# Patient Record
Sex: Male | Born: 1945 | Race: White | Hispanic: No | Marital: Married | Smoking: Former smoker
Health system: Southern US, Community
[De-identification: ages and names within clinical notes are randomized; demographics above are authoritative.]

## PROBLEM LIST (undated history)

## (undated) DIAGNOSIS — M719 Bursopathy, unspecified: Secondary | ICD-10-CM

## (undated) DIAGNOSIS — I499 Cardiac arrhythmia, unspecified: Secondary | ICD-10-CM

## (undated) DIAGNOSIS — M722 Plantar fascial fibromatosis: Secondary | ICD-10-CM

## (undated) DIAGNOSIS — Z9889 Other specified postprocedural states: Secondary | ICD-10-CM

## (undated) DIAGNOSIS — E785 Hyperlipidemia, unspecified: Secondary | ICD-10-CM

## (undated) DIAGNOSIS — C801 Malignant (primary) neoplasm, unspecified: Secondary | ICD-10-CM

## (undated) DIAGNOSIS — R112 Nausea with vomiting, unspecified: Secondary | ICD-10-CM

## (undated) DIAGNOSIS — M1990 Unspecified osteoarthritis, unspecified site: Secondary | ICD-10-CM

## (undated) DIAGNOSIS — R011 Cardiac murmur, unspecified: Secondary | ICD-10-CM

## (undated) DIAGNOSIS — Z87442 Personal history of urinary calculi: Secondary | ICD-10-CM

## (undated) DIAGNOSIS — I00 Rheumatic fever without heart involvement: Secondary | ICD-10-CM

## (undated) DIAGNOSIS — I209 Angina pectoris, unspecified: Secondary | ICD-10-CM

## (undated) DIAGNOSIS — E119 Type 2 diabetes mellitus without complications: Secondary | ICD-10-CM

## (undated) DIAGNOSIS — K219 Gastro-esophageal reflux disease without esophagitis: Secondary | ICD-10-CM

## (undated) DIAGNOSIS — I10 Essential (primary) hypertension: Secondary | ICD-10-CM

## (undated) DIAGNOSIS — N189 Chronic kidney disease, unspecified: Secondary | ICD-10-CM

## (undated) DIAGNOSIS — I2510 Atherosclerotic heart disease of native coronary artery without angina pectoris: Secondary | ICD-10-CM

## (undated) DIAGNOSIS — I739 Peripheral vascular disease, unspecified: Secondary | ICD-10-CM

## (undated) HISTORY — PX: SHOULDER ARTHROSCOPY: SHX128

## (undated) HISTORY — DX: Hyperlipidemia, unspecified: E78.5

## (undated) HISTORY — PX: CIRCUMCISION: SUR203

## (undated) HISTORY — PX: VASECTOMY: SHX75

## (undated) HISTORY — PX: FEMORAL-PERONEAL BYPASS GRAFT: SHX5163

## (undated) HISTORY — PX: CARDIAC CATHETERIZATION: SHX172

## (undated) HISTORY — PX: OTHER SURGICAL HISTORY: SHX169

---

## 1998-01-10 ENCOUNTER — Observation Stay (HOSPITAL_COMMUNITY): Admission: RE | Admit: 1998-01-10 | Discharge: 1998-01-11 | Payer: Self-pay | Admitting: *Deleted

## 2004-04-18 ENCOUNTER — Encounter: Admission: RE | Admit: 2004-04-18 | Discharge: 2004-04-18 | Payer: Self-pay | Admitting: Internal Medicine

## 2010-07-04 ENCOUNTER — Ambulatory Visit
Admission: RE | Admit: 2010-07-04 | Discharge: 2010-07-04 | Disposition: A | Discharge status: Home / Self Care | Payer: Medicare Other | Source: Ambulatory Visit | Attending: Gastroenterology | Admitting: Gastroenterology

## 2010-07-04 ENCOUNTER — Other Ambulatory Visit: Payer: Self-pay | Admitting: Gastroenterology

## 2010-07-04 DIAGNOSIS — R1031 Right lower quadrant pain: Secondary | ICD-10-CM

## 2010-07-04 MED ORDER — IOHEXOL 300 MG/ML  SOLN
100.0000 mL | Freq: Once | INTRAMUSCULAR | Status: AC | PRN
  Administered 2010-07-04: 100 mL via INTRAVENOUS

## 2012-05-21 DIAGNOSIS — H269 Unspecified cataract: Secondary | ICD-10-CM

## 2012-05-21 HISTORY — DX: Unspecified cataract: H26.9

## 2013-07-23 ENCOUNTER — Other Ambulatory Visit: Payer: Self-pay | Admitting: Orthopedic Surgery

## 2013-07-24 ENCOUNTER — Other Ambulatory Visit: Payer: Self-pay | Admitting: Orthopedic Surgery

## 2013-07-24 NOTE — H&P (Signed)
[name] is an 68 y.o. male.   Chief Complaint: back and left leg pain HPI: The patient is a 68 year old male being followed for their left-sided back pain and leg pain down to the foot. Symptoms reported today include: pain, aching, pain with weightbearing, difficulty ambulating, weakness, numbness, burning and leg pain, while the patient does not report symptoms of: pain with sitting or foot drop. The patient states that they are doing poorly. The following medication has been used for pain control: Ultram. The patient reports their current pain level to be severe. The patient presents today following MRI. The patient has not gotten any relief of their symptoms with activity modification, conservative measures, Cortisone injections, NSAIDs or rest. Note for "Follow-up back": He has decided to use crutches to help him walk but this makes his back worse.  He reports increasing pain through this past weekend with no change in activity or new injury. He reports continued pain left lower back into the buttock and left posterolateral leg to the ankle with numbness in the same distribution and into the dorsal foot. The leg does also feel weak like it will give out on him. Over the last week or so he is also having pain in the anterior left thigh which "feels bruised." He denies any right sided symptoms. Reports the pain is actually most severe in the back and buttock. He has previously seen Dr. Nelva Bush and has had several ESI's, initially with relief, but over time less successful. He is taking ultram as needed for pain, but it's not helping. He did take some Norco from his wife which did help. She also has given him neurontin and lyrica which did not seem to help as much as the norco. His pain is least severe when lying down on his back with his left leg flexed up. His pain is worse sitting.   No past medical history on file.  No past surgical history on file.  No family history on file. Social  History:  has no tobacco, alcohol, and drug history on file.  Allergies: Allergies not on file   (Not in a hospital admission)  No results found for this or any previous visit (from the past 48 hour(s)). No results found.  Review of Systems  Constitutional: Negative.   HENT: Negative.   Eyes: Negative.   Respiratory: Negative.   Cardiovascular: Negative.   Gastrointestinal: Negative.   Genitourinary: Negative.   Musculoskeletal: Positive for back pain.  Skin: Negative.   Neurological: Positive for sensory change and focal weakness.    There were no vitals taken for this visit. Physical Exam  Constitutional: He is oriented to person, place, and time. He appears well-developed and well-nourished. He appears distressed.  HENT:  Head: Normocephalic and atraumatic.  Eyes: Conjunctivae and EOM are normal. Pupils are equal, round, and reactive to light.  Neck: Normal range of motion. Neck supple.  Cardiovascular: Normal rate and regular rhythm.   Respiratory: Effort normal and breath sounds normal.  GI: Soft. Bowel sounds are normal.  Musculoskeletal:  General Mental Status - Alert. General Appearance- pleasant and In acute distress. appears uncomfortable (lying down on exam table, left leg flexed up). Orientation- Oriented X3. Build & Nutrition- Well nourished and Well developed. Gait- Use of assistive device (crutches).  Abdomen Palpation/Percussion:Tenderness- Abdomen is non-tender to palpation. Rigidity (guarding)- Abdomen is soft.  Peripheral Vascular Lower Extremity: Palpation:Homan's sign- Bilateral- Negative (normal). Posterior tibial pulse- Bilateral- 2+. Dorsalis pedis pulse- Bilateral- 2+.  Neurologic Motor: Strength :Quadriceps- Left- 4/5. Right- 5/5. Hamstrings- Bilateral- 5/5. Ankle Dorsiflexion- Bilateral- 5/5. Ankle Plantarflexion- Bilateral- 5/5. Extensor Hallucis Longus- Left- 4/5. Right- 5/5. Sensation:Lower Extremity-  Left- sensation is diminished to light touch in the lower extremity (L5 distribution dorsal foot). Right- sensation is intact to light touch in the lower extremity. Reflexes:Patellar Reflex- Bilateral- 2+. Achilles Reflex- Bilateral- 2+. Babinski- Bilateral- Babinski not present. Clonus- Bilateral- clonus not present.  Musculoskeletal Spine/Ribs/Pelvis Lumbosacral Spine:Inspection and Palpation- Tenderness- no tenderness to palpation of the left flank, no tenderness to palpation of right flank, no tenderness to palpation of the left greater trochanter and no tenderness to palpation of the right greater trochanter. Swelling- none. Other characteristics- no ecchymosis, no abnormal warmth, no erythema and no evidence of cellulitis. ROM- Testing limited- due to pain. Special Testing- Lumbar- Left supine straight leg raise positive (produces buttock, posterolateral leg pain to ankle). Right supine straight leg raise negative. Lower Extremity Right Lower Extremity: Right Hip: ROM:- Full ROM of the hip and - pain-free. Right Knee: ROM:- Full ROM of the knee and - pain-free. Right Ankle: ROM:- Full and pain-free ROM of the ankle. Left Lower Extremity: Left Hip : ROM:- Full ROM of the hip and - pain-free. Left Knee: ROM:- Full ROM of the knee and - pain-free. Left Ankle: ROM:- Full and pain-free ROM of the ankle.  Lymphatic General Lymphatics Description- No Localized lymphadenopathy.  Neurological: He is oriented to person, place, and time. He has normal reflexes.  Skin: Skin is warm and dry.  Psychiatric: He has a normal mood and affect.    prior xrays reviewed by Dr. Tonita Cong with mild scoliosis, convex left curvature. Multilevel DDD. There is a trace listhesis at L4-5 which appears stable in flexion and extension. Mild aortic calcification noted. Hips unremarkable.  MRI Lspine images and report reviewed today by Dr. Tonita Cong with severe left foraminal  stenosis L4-5 secondary to ligamentum flavum hypertrophy, facet arthrosis, and disc bulge/osteophyte complex, with mass effect left L4 nerve root. Moderate to severe right foraminal stenosis at this level without mass effect L4 nerve root. There is also moderate foraminal stenosis at L3-4 bilaterally causing mass effect left L3 root, multifactorial as noted at L4-5. Schmorl's nodes also noted L3-4.  Assessment/Plan Spinal stenosis L4-5, L3-4  Pt with ongoing, progressively worsening left sided LBP and LLE pain, myotomal weakness, dermatomal dysthesias, secondary to multilevel spinal stenosis (L3-4, L4-5), refractory to ESI, SNRB, activity modifications, relative rest, pain medications. His LLE symptoms appear to be in the L4 and L5 distribution, with new L anterior thigh pain, L3 distribution. We discussed relevant anatomy, the etiology of his pain, reviewed his studies in detail. He has multilevel foraminal stenosis, most significant at L4-5, but moderate at L3-4 as well, and although L4-5 seems to be causing the majority of his symptoms, L3-4 seems to be symptomatic as well, especially in the last week. Given his ongoing and worsening pain, failure to improve with conservative tx, ongoing neural tension signs and weakness on exam, recommend microlumbar decompression L4-5, possible L3-4. We discussed the procedure itself as well as risks, complications and alternatives including but not limited to DVT, PE, infx, bleeding, failure of procedure, need for secondary procedure, anesthesia risk, CSF leak, dural tear, even death. Discussed post-op protocols, time in hospital, Lspine precautions, activity modifications, need for post-op PT. He denies hx of DVT or MRSA. We also discussed that this surgery is done to make an unbearable situation bearable and will relieve pressure from the nerve but  will not tx DDD in his back. It will take time for the nerve to heal and for any numbness, tingling, and weakness to  improve. We discussed possible need for future spine fusion if refractory, although would not recommend concomitant fusion at this time. All his questions were answered and he desires to proceed with surgery. Will need to obtain pre-op clearance from his PCP Windell Hummingbird PA-C with [facility]), vascular specialist (Dr. Maryjean Morn), cardiologist (Dr. Otho Perl), and nephrologist (Dr. Alba Destine). Letters for clearance for all these specialists were given today. Once clearance is obtained, will proceed accordingly with scheduling surgery. In the interim, will change pain meds to Norco instead of Ultram. Discussed neurontin, he did not seem to think it helped before, will hold off. Continue activity modifications. He will follow up 10-14 days post-op for staple removal and plan to initiate PT for gentle neural stretching at that time. He will call with any questions or concerns in the interim.  Plan microlumbar decompression L4-5, L3-4  BISSELL, JACLYN M. for Dr. Tonita Cong 07/24/2013, 2:58 PM

## 2013-07-28 ENCOUNTER — Encounter (HOSPITAL_COMMUNITY): Payer: Self-pay | Admitting: Pharmacy Technician

## 2013-07-30 NOTE — Patient Instructions (Addendum)
20 TERREON EKHOLM  07/31/2013   Your procedure is scheduled on:  3-18 -2015  Report to [place] at    61    AM.  Call this number if you have problems the morning of surgery: [phone]  Or Presurgical Testing [phone](Syler Norcia) For Living Will and/or Health Care Power Attorney Forms: please provide copy for your medical record,may bring AM of surgery(Forms should be already notarized -we do not provide this service).     Do not eat food:After Midnight.  May have clear liquids:up to 6 Hours before arrival. Nothing after : 0700 AM.  Clear liquids include soda, tea, black coffee, apple or grape juice, broth.  Take these medicines the morning of surgery with A SIP OF WATER: Carvedilol. Omeprazole. Do not take any Diabetic meds AM of. Stop all supplements prior to surgery. Use Aspirin, Plavix as per Doctor instructions.   Do not wear jewelry, make-up or nail polish.  Do not wear lotions, powders, or perfumes. You may wear deodorant.  Do not shave 48 hours(2 days) prior to first CHG shower(legs and under arms).(Shaving face and neck okay.)  Do not bring valuables to the hospital.(Hospital is not responsible for lost valuables).  Contacts, dentures or removable bridgework, body piercing, hair pins may not be worn into surgery.  Leave suitcase in the car. After surgery it may be brought to your room.  For patients admitted to the hospital, checkout time is 11:00 AM the day of discharge.(Restricted visitors-Any Persons displaying flu-like symptoms or illness).    Patients discharged the day of surgery will not be allowed to drive home. Must have responsible person with you x 24 hours once discharged.  Name and phone number of your driver: Linda,spouse [phone]   Special Instructions: CHG(Chlorhedine 4%-"Hibiclens","Betasept","Aplicare") Shower Use Special Wash: see special instructions.(avoid face and genitals)   Please read over the following fact sheets that you  were given: MRSA Information.  Remember : Type/Screen "Blue armbands" - may not be removed once applied(would result in being retested AM of surgery, if removed).  Failure to follow these instructions may result in Cancellation of your surgery.   Patient signature_______________________________________________________

## 2013-07-31 ENCOUNTER — Encounter (HOSPITAL_COMMUNITY): Payer: Self-pay

## 2013-07-31 ENCOUNTER — Ambulatory Visit (HOSPITAL_COMMUNITY)
Admission: RE | Admit: 2013-07-31 | Discharge: 2013-07-31 | Disposition: A | Discharge status: Home / Self Care | Payer: MEDICARE | Source: Ambulatory Visit | Attending: Orthopedic Surgery | Admitting: Orthopedic Surgery

## 2013-07-31 ENCOUNTER — Encounter (HOSPITAL_COMMUNITY)
Admission: RE | Admit: 2013-07-31 | Discharge: 2013-07-31 | Disposition: A | Discharge status: Home / Self Care | Payer: MEDICARE | Source: Ambulatory Visit | Attending: Specialist | Admitting: Specialist

## 2013-07-31 DIAGNOSIS — M722 Plantar fascial fibromatosis: Secondary | ICD-10-CM

## 2013-07-31 DIAGNOSIS — M545 Low back pain, unspecified: Secondary | ICD-10-CM | POA: Insufficient documentation

## 2013-07-31 DIAGNOSIS — Z01812 Encounter for preprocedural laboratory examination: Secondary | ICD-10-CM | POA: Insufficient documentation

## 2013-07-31 DIAGNOSIS — I708 Atherosclerosis of other arteries: Secondary | ICD-10-CM | POA: Insufficient documentation

## 2013-07-31 DIAGNOSIS — M79609 Pain in unspecified limb: Secondary | ICD-10-CM | POA: Insufficient documentation

## 2013-07-31 DIAGNOSIS — I2510 Atherosclerotic heart disease of native coronary artery without angina pectoris: Secondary | ICD-10-CM

## 2013-07-31 DIAGNOSIS — Z01818 Encounter for other preprocedural examination: Secondary | ICD-10-CM | POA: Insufficient documentation

## 2013-07-31 DIAGNOSIS — I209 Angina pectoris, unspecified: Secondary | ICD-10-CM

## 2013-07-31 DIAGNOSIS — I700 Atherosclerosis of aorta: Secondary | ICD-10-CM | POA: Insufficient documentation

## 2013-07-31 DIAGNOSIS — C801 Malignant (primary) neoplasm, unspecified: Secondary | ICD-10-CM

## 2013-07-31 DIAGNOSIS — N189 Chronic kidney disease, unspecified: Secondary | ICD-10-CM

## 2013-07-31 DIAGNOSIS — I499 Cardiac arrhythmia, unspecified: Secondary | ICD-10-CM

## 2013-07-31 DIAGNOSIS — M719 Bursopathy, unspecified: Secondary | ICD-10-CM

## 2013-07-31 DIAGNOSIS — Z87442 Personal history of urinary calculi: Secondary | ICD-10-CM

## 2013-07-31 HISTORY — DX: Cardiac arrhythmia, unspecified: I49.9

## 2013-07-31 HISTORY — DX: Unspecified osteoarthritis, unspecified site: M19.90

## 2013-07-31 HISTORY — DX: Essential (primary) hypertension: I10

## 2013-07-31 HISTORY — DX: Peripheral vascular disease, unspecified: I73.9

## 2013-07-31 HISTORY — DX: Personal history of urinary calculi: Z87.442

## 2013-07-31 HISTORY — DX: Gastro-esophageal reflux disease without esophagitis: K21.9

## 2013-07-31 HISTORY — DX: Plantar fascial fibromatosis: M72.2

## 2013-07-31 HISTORY — DX: Bursopathy, unspecified: M71.9

## 2013-07-31 HISTORY — DX: Other specified postprocedural states: R11.2

## 2013-07-31 HISTORY — DX: Atherosclerotic heart disease of native coronary artery without angina pectoris: I25.10

## 2013-07-31 HISTORY — DX: Malignant (primary) neoplasm, unspecified: C80.1

## 2013-07-31 HISTORY — DX: Type 2 diabetes mellitus without complications: E11.9

## 2013-07-31 HISTORY — DX: Chronic kidney disease, unspecified: N18.9

## 2013-07-31 HISTORY — PX: OTHER SURGICAL HISTORY: SHX169

## 2013-07-31 HISTORY — DX: Cardiac murmur, unspecified: R01.1

## 2013-07-31 HISTORY — DX: Angina pectoris, unspecified: I20.9

## 2013-07-31 HISTORY — PX: RENAL ARTERY STENT: SHX2321

## 2013-07-31 HISTORY — DX: Other specified postprocedural states: Z98.890

## 2013-07-31 LAB — BASIC METABOLIC PANEL
BUN: 17 mg/dL (ref 6–23)
CO2: 28 mEq/L (ref 19–32)
Calcium: 10.1 mg/dL (ref 8.4–10.5)
Chloride: 103 mEq/L (ref 96–112)
Creatinine, Ser: 1.43 mg/dL — ABNORMAL HIGH (ref 0.50–1.35)
GFR calc Af Amer: 56 mL/min — ABNORMAL LOW (ref >90)
GFR calc non Af Amer: 48 mL/min — ABNORMAL LOW (ref >90)
Glucose, Bld: 117 mg/dL — ABNORMAL HIGH (ref 70–99)
Potassium: 4.5 mEq/L (ref 3.7–5.3)
Sodium: 142 mEq/L (ref 137–147)

## 2013-07-31 LAB — SURGICAL PCR SCREEN
MRSA, PCR: NEGATIVE
Staphylococcus aureus: POSITIVE — AB

## 2013-07-31 LAB — CBC
HCT: 44.3 % (ref 39.0–52.0)
Hemoglobin: 15.9 g/dL (ref 13.0–17.0)
MCH: 31.5 pg (ref 26.0–34.0)
MCHC: 35.9 g/dL (ref 30.0–36.0)
MCV: 87.9 fL (ref 78.0–100.0)
Platelets: 207 10*3/uL (ref 150–400)
RBC: 5.04 MIL/uL (ref 4.22–5.81)
RDW: 12.7 % (ref 11.5–15.5)
WBC: 6.8 10*3/uL (ref 4.0–10.5)

## 2013-07-31 NOTE — Pre-Procedure Instructions (Addendum)
07-31-13 EKG/ CXR  4'14, reports with chart. Stress 12'13 report with chart. 07-31-13 1545 Not e to Dr. Reather Littler office [phone]- labs viewable in [facility]. 08-03-13 1005 Patient notified and Dr. Reather Littler office, PCR Positive for Staph aureus, pt. Aware Mupirocin ointment called to [facility] [phone], use as directed. W. Floy Sabina

## 2013-07-31 NOTE — Progress Notes (Signed)
07-31-13 Labs viewable in Epic, hx. End stage renal disease.Stage 3 being followed.

## 2013-08-03 ENCOUNTER — Other Ambulatory Visit: Payer: Self-pay | Admitting: Orthopedic Surgery

## 2013-08-03 NOTE — Progress Notes (Signed)
08-03-13 1005 Patient has Positive PCR screen for Staph aureus- Rx. For Mupirocin called to [facility] [phone], pt. Aware. W. Floy Sabina

## 2013-08-05 ENCOUNTER — Ambulatory Visit (HOSPITAL_COMMUNITY): Payer: MEDICARE

## 2013-08-05 ENCOUNTER — Encounter (HOSPITAL_COMMUNITY)
Admission: RE | Disposition: A | Discharge status: Home / Self Care | Payer: Self-pay | Source: Ambulatory Visit | Attending: Specialist

## 2013-08-05 ENCOUNTER — Encounter (HOSPITAL_COMMUNITY): Payer: Self-pay | Admitting: *Deleted

## 2013-08-05 ENCOUNTER — Encounter (HOSPITAL_COMMUNITY): Payer: MEDICARE | Admitting: Certified Registered Nurse Anesthetist

## 2013-08-05 ENCOUNTER — Observation Stay (HOSPITAL_COMMUNITY)
Admission: RE | Admit: 2013-08-05 | Discharge: 2013-08-06 | Disposition: A | Discharge status: Home / Self Care | Payer: MEDICARE | Source: Ambulatory Visit | Attending: Specialist | Admitting: Specialist

## 2013-08-05 ENCOUNTER — Ambulatory Visit (HOSPITAL_COMMUNITY): Payer: MEDICARE | Admitting: Certified Registered Nurse Anesthetist

## 2013-08-05 DIAGNOSIS — I129 Hypertensive chronic kidney disease with stage 1 through stage 4 chronic kidney disease, or unspecified chronic kidney disease: Secondary | ICD-10-CM | POA: Insufficient documentation

## 2013-08-05 DIAGNOSIS — M48061 Spinal stenosis, lumbar region without neurogenic claudication: Secondary | ICD-10-CM | POA: Diagnosis present

## 2013-08-05 DIAGNOSIS — N189 Chronic kidney disease, unspecified: Secondary | ICD-10-CM | POA: Insufficient documentation

## 2013-08-05 DIAGNOSIS — M4120 Other idiopathic scoliosis, site unspecified: Secondary | ICD-10-CM | POA: Insufficient documentation

## 2013-08-05 DIAGNOSIS — M5126 Other intervertebral disc displacement, lumbar region: Principal | ICD-10-CM | POA: Insufficient documentation

## 2013-08-05 DIAGNOSIS — Z85828 Personal history of other malignant neoplasm of skin: Secondary | ICD-10-CM | POA: Insufficient documentation

## 2013-08-05 DIAGNOSIS — Z79899 Other long term (current) drug therapy: Secondary | ICD-10-CM | POA: Insufficient documentation

## 2013-08-05 DIAGNOSIS — I739 Peripheral vascular disease, unspecified: Secondary | ICD-10-CM | POA: Insufficient documentation

## 2013-08-05 DIAGNOSIS — M5416 Radiculopathy, lumbar region: Secondary | ICD-10-CM

## 2013-08-05 DIAGNOSIS — E119 Type 2 diabetes mellitus without complications: Secondary | ICD-10-CM | POA: Insufficient documentation

## 2013-08-05 DIAGNOSIS — I2510 Atherosclerotic heart disease of native coronary artery without angina pectoris: Secondary | ICD-10-CM | POA: Insufficient documentation

## 2013-08-05 DIAGNOSIS — K219 Gastro-esophageal reflux disease without esophagitis: Secondary | ICD-10-CM | POA: Insufficient documentation

## 2013-08-05 HISTORY — PX: LUMBAR LAMINECTOMY/DECOMPRESSION MICRODISCECTOMY: SHX5026

## 2013-08-05 LAB — GLUCOSE, CAPILLARY
Glucose-Capillary: 165 mg/dL — ABNORMAL HIGH (ref 70–99)
Glucose-Capillary: 135 mg/dL — ABNORMAL HIGH (ref 70–99)
Glucose-Capillary: 185 mg/dL — ABNORMAL HIGH (ref 70–99)
Glucose-Capillary: 185 mg/dL — ABNORMAL HIGH (ref 70–99)

## 2013-08-05 SURGERY — LUMBAR LAMINECTOMY/DECOMPRESSION MICRODISCECTOMY 2 LEVELS
Anesthesia: General | Site: Back

## 2013-08-05 MED ORDER — FENTANYL CITRATE 0.05 MG/ML IJ SOLN
INTRAMUSCULAR | Status: AC
  Filled 2013-08-05: qty 5

## 2013-08-05 MED ORDER — HYDROMORPHONE HCL PF 1 MG/ML IJ SOLN
INTRAMUSCULAR | Status: DC | PRN
  Administered 2013-08-05 (×2): 0.5 mg via INTRAVENOUS

## 2013-08-05 MED ORDER — CLINDAMYCIN PHOSPHATE 900 MG/50ML IV SOLN
INTRAVENOUS | Status: AC
  Filled 2013-08-05: qty 50

## 2013-08-05 MED ORDER — PROPOFOL 10 MG/ML IV BOLUS
INTRAVENOUS | Status: DC | PRN
  Administered 2013-08-05: 130 mg via INTRAVENOUS

## 2013-08-05 MED ORDER — HYDROCODONE-ACETAMINOPHEN 5-325 MG PO TABS: 1.0000 | ORAL_TABLET | ORAL | Status: DC | PRN

## 2013-08-05 MED ORDER — THROMBIN 5000 UNITS EX SOLR
CUTANEOUS | Status: AC
  Filled 2013-08-05: qty 10000

## 2013-08-05 MED ORDER — EPHEDRINE SULFATE 50 MG/ML IJ SOLN
INTRAMUSCULAR | Status: DC | PRN
  Administered 2013-08-05: 5 mg via INTRAVENOUS
  Administered 2013-08-05: 10 mg via INTRAVENOUS

## 2013-08-05 MED ORDER — HYDROCODONE-ACETAMINOPHEN 5-325 MG PO TABS
1.0000 | ORAL_TABLET | ORAL | Status: DC | PRN
  Administered 2013-08-05: 1 via ORAL
  Administered 2013-08-06 (×2): 2 via ORAL
  Filled 2013-08-05 (×3): qty 2

## 2013-08-05 MED ORDER — MUPIROCIN 2 % EX OINT
TOPICAL_OINTMENT | Freq: Two times a day (BID) | CUTANEOUS | Status: DC
  Administered 2013-08-05: 1 via NASAL
  Administered 2013-08-06: 10:00:00 via NASAL

## 2013-08-05 MED ORDER — LACTATED RINGERS IV SOLN
INTRAVENOUS | Status: DC
  Administered 2013-08-05: 14:00:00 via INTRAVENOUS
  Administered 2013-08-05: 1000 mL via INTRAVENOUS

## 2013-08-05 MED ORDER — ONDANSETRON HCL 4 MG/2ML IJ SOLN
INTRAMUSCULAR | Status: DC | PRN
  Administered 2013-08-05: 4 mg via INTRAVENOUS

## 2013-08-05 MED ORDER — THROMBIN 5000 UNITS EX SOLR
CUTANEOUS | Status: DC | PRN
  Administered 2013-08-05: 15:00:00 via TOPICAL

## 2013-08-05 MED ORDER — INSULIN ASPART 100 UNIT/ML ~~LOC~~ SOLN
0.0000 [IU] | Freq: Three times a day (TID) | SUBCUTANEOUS | Status: DC
  Administered 2013-08-05 – 2013-08-06 (×2): 3 [IU] via SUBCUTANEOUS

## 2013-08-05 MED ORDER — CARVEDILOL 3.125 MG PO TABS
3.1250 mg | ORAL_TABLET | Freq: Two times a day (BID) | ORAL | Status: DC
  Administered 2013-08-05 – 2013-08-06 (×2): 3.125 mg via ORAL
  Filled 2013-08-05 (×4): qty 1

## 2013-08-05 MED ORDER — ACETAMINOPHEN 325 MG PO TABS: 650.0000 mg | ORAL_TABLET | ORAL | Status: DC | PRN

## 2013-08-05 MED ORDER — BUPIVACAINE-EPINEPHRINE PF 0.5-1:200000 % IJ SOLN
INTRAMUSCULAR | Status: AC
  Filled 2013-08-05: qty 30

## 2013-08-05 MED ORDER — HYDROMORPHONE HCL PF 2 MG/ML IJ SOLN
INTRAMUSCULAR | Status: AC
  Filled 2013-08-05: qty 1

## 2013-08-05 MED ORDER — DOCUSATE SODIUM 100 MG PO CAPS
100.0000 mg | ORAL_CAPSULE | Freq: Two times a day (BID) | ORAL | Status: DC
  Administered 2013-08-05 – 2013-08-06 (×2): 100 mg via ORAL

## 2013-08-05 MED ORDER — CLINDAMYCIN PHOSPHATE 900 MG/50ML IV SOLN
900.0000 mg | INTRAVENOUS | Status: AC
  Administered 2013-08-05: 900 mg via INTRAVENOUS

## 2013-08-05 MED ORDER — NEOSTIGMINE METHYLSULFATE 1 MG/ML IJ SOLN
INTRAMUSCULAR | Status: DC | PRN
  Administered 2013-08-05: 4 mg via INTRAVENOUS

## 2013-08-05 MED ORDER — ONDANSETRON HCL 4 MG/2ML IJ SOLN
INTRAMUSCULAR | Status: AC
  Filled 2013-08-05: qty 2

## 2013-08-05 MED ORDER — PROPOFOL 10 MG/ML IV BOLUS
INTRAVENOUS | Status: AC
  Filled 2013-08-05: qty 20

## 2013-08-05 MED ORDER — PROMETHAZINE HCL 25 MG/ML IJ SOLN: 6.2500 mg | INTRAMUSCULAR | Status: DC | PRN

## 2013-08-05 MED ORDER — ACETAMINOPHEN 650 MG RE SUPP: 650.0000 mg | RECTAL | Status: DC | PRN

## 2013-08-05 MED ORDER — CEFAZOLIN SODIUM-DEXTROSE 2-3 GM-% IV SOLR
2.0000 g | INTRAVENOUS | Status: AC
  Administered 2013-08-05: 2 g via INTRAVENOUS

## 2013-08-05 MED ORDER — PHENOL 1.4 % MT LIQD: 1.0000 | OROMUCOSAL | Status: DC | PRN

## 2013-08-05 MED ORDER — SODIUM CHLORIDE 0.9 % IJ SOLN: 3.0000 mL | INTRAMUSCULAR | Status: DC | PRN

## 2013-08-05 MED ORDER — DEXAMETHASONE SODIUM PHOSPHATE 10 MG/ML IJ SOLN
INTRAMUSCULAR | Status: DC | PRN
  Administered 2013-08-05: 4 mg via INTRAVENOUS

## 2013-08-05 MED ORDER — BUPIVACAINE-EPINEPHRINE (PF) 0.5% -1:200000 IJ SOLN
INTRAMUSCULAR | Status: DC | PRN
  Administered 2013-08-05: 15 mL

## 2013-08-05 MED ORDER — LIDOCAINE HCL (CARDIAC) 20 MG/ML IV SOLN
INTRAVENOUS | Status: AC
  Filled 2013-08-05: qty 5

## 2013-08-05 MED ORDER — DOCUSATE SODIUM 100 MG PO CAPS: 100.0000 mg | ORAL_CAPSULE | Freq: Two times a day (BID) | ORAL | Status: DC | PRN

## 2013-08-05 MED ORDER — SODIUM CHLORIDE 0.9 % IV SOLN: 250.0000 mL | INTRAVENOUS | Status: DC

## 2013-08-05 MED ORDER — CEFAZOLIN SODIUM-DEXTROSE 2-3 GM-% IV SOLR
2.0000 g | Freq: Three times a day (TID) | INTRAVENOUS | Status: AC
  Administered 2013-08-05 – 2013-08-06 (×2): 2 g via INTRAVENOUS
  Filled 2013-08-05 (×2): qty 50

## 2013-08-05 MED ORDER — ROCURONIUM BROMIDE 100 MG/10ML IV SOLN
INTRAVENOUS | Status: DC | PRN
  Administered 2013-08-05: 40 mg via INTRAVENOUS

## 2013-08-05 MED ORDER — MENTHOL 3 MG MT LOZG
1.0000 | LOZENGE | OROMUCOSAL | Status: DC | PRN
  Filled 2013-08-05: qty 9

## 2013-08-05 MED ORDER — GLIPIZIDE 5 MG PO TABS
5.0000 mg | ORAL_TABLET | Freq: Every day | ORAL | Status: DC
Start: 2013-08-06 — End: 2013-08-06
  Administered 2013-08-06: 5 mg via ORAL
  Filled 2013-08-05 (×2): qty 1

## 2013-08-05 MED ORDER — PHENYLEPHRINE HCL 10 MG/ML IJ SOLN
INTRAMUSCULAR | Status: DC | PRN
  Administered 2013-08-05 (×2): 80 ug via INTRAVENOUS

## 2013-08-05 MED ORDER — PHENYLEPHRINE HCL 10 MG/ML IJ SOLN: 30.0000 ug/min | INTRAVENOUS | Status: DC

## 2013-08-05 MED ORDER — THROMBIN 5000 UNITS EX SOLR
OROMUCOSAL | Status: DC | PRN
  Administered 2013-08-05: 15:00:00 via TOPICAL

## 2013-08-05 MED ORDER — PHENYLEPHRINE HCL 10 MG/ML IJ SOLN
10.0000 mg | INTRAMUSCULAR | Status: DC | PRN
  Administered 2013-08-05: 50 ug/min via INTRAVENOUS

## 2013-08-05 MED ORDER — FENTANYL CITRATE 0.05 MG/ML IJ SOLN
INTRAMUSCULAR | Status: DC | PRN
  Administered 2013-08-05 (×5): 50 ug via INTRAVENOUS

## 2013-08-05 MED ORDER — HYDROMORPHONE HCL PF 1 MG/ML IJ SOLN: 0.5000 mg | INTRAMUSCULAR | Status: DC | PRN

## 2013-08-05 MED ORDER — ROCURONIUM BROMIDE 100 MG/10ML IV SOLN
INTRAVENOUS | Status: AC
  Filled 2013-08-05: qty 1

## 2013-08-05 MED ORDER — OXYCODONE-ACETAMINOPHEN 5-325 MG PO TABS: 1.0000 | ORAL_TABLET | ORAL | Status: DC | PRN

## 2013-08-05 MED ORDER — SODIUM CHLORIDE 0.45 % IV SOLN
INTRAVENOUS | Status: DC
  Administered 2013-08-05 – 2013-08-06 (×2): via INTRAVENOUS

## 2013-08-05 MED ORDER — LIDOCAINE HCL (CARDIAC) 20 MG/ML IV SOLN
INTRAVENOUS | Status: DC | PRN
  Administered 2013-08-05: 80 mg via INTRAVENOUS

## 2013-08-05 MED ORDER — MIDAZOLAM HCL 5 MG/5ML IJ SOLN
INTRAMUSCULAR | Status: DC | PRN
  Administered 2013-08-05: 1 mg via INTRAVENOUS

## 2013-08-05 MED ORDER — GLYCOPYRROLATE 0.2 MG/ML IJ SOLN
INTRAMUSCULAR | Status: DC | PRN
  Administered 2013-08-05: 0.6 mg via INTRAVENOUS

## 2013-08-05 MED ORDER — SODIUM CHLORIDE 0.9 % IJ SOLN: 3.0000 mL | Freq: Two times a day (BID) | INTRAMUSCULAR | Status: DC

## 2013-08-05 MED ORDER — ONDANSETRON HCL 4 MG/2ML IJ SOLN
4.0000 mg | INTRAMUSCULAR | Status: DC | PRN
  Administered 2013-08-05: 4 mg via INTRAVENOUS
  Filled 2013-08-05: qty 2

## 2013-08-05 MED ORDER — CEFAZOLIN SODIUM-DEXTROSE 2-3 GM-% IV SOLR
INTRAVENOUS | Status: AC
  Filled 2013-08-05: qty 50

## 2013-08-05 MED ORDER — GLYCOPYRROLATE 0.2 MG/ML IJ SOLN
INTRAMUSCULAR | Status: AC
  Filled 2013-08-05: qty 3

## 2013-08-05 MED ORDER — PANTOPRAZOLE SODIUM 40 MG PO TBEC
40.0000 mg | DELAYED_RELEASE_TABLET | Freq: Every day | ORAL | Status: DC
Start: 2013-08-06 — End: 2013-08-06
  Administered 2013-08-06: 40 mg via ORAL
  Filled 2013-08-05: qty 1

## 2013-08-05 MED ORDER — MEPERIDINE HCL 50 MG/ML IJ SOLN: 6.2500 mg | INTRAMUSCULAR | Status: DC | PRN

## 2013-08-05 MED ORDER — NEOSTIGMINE METHYLSULFATE 1 MG/ML IJ SOLN
INTRAMUSCULAR | Status: AC
  Filled 2013-08-05: qty 10

## 2013-08-05 MED ORDER — ACETAMINOPHEN 10 MG/ML IV SOLN
1000.0000 mg | Freq: Once | INTRAVENOUS | Status: AC
  Administered 2013-08-05: 1000 mg via INTRAVENOUS
  Filled 2013-08-05: qty 100

## 2013-08-05 MED ORDER — SODIUM CHLORIDE 0.9 % IR SOLN
Status: DC | PRN
  Administered 2013-08-05: 15:00:00

## 2013-08-05 MED ORDER — FENTANYL CITRATE 0.05 MG/ML IJ SOLN: 25.0000 ug | INTRAMUSCULAR | Status: DC | PRN

## 2013-08-05 MED ORDER — MIDAZOLAM HCL 2 MG/2ML IJ SOLN
INTRAMUSCULAR | Status: AC
  Filled 2013-08-05: qty 2

## 2013-08-05 SURGICAL SUPPLY — 45 items
BAG SPEC THK2 15X12 ZIP CLS (MISCELLANEOUS)
BAG ZIPLOCK 12X15 (MISCELLANEOUS) ×1 IMPLANT
CLEANER TIP ELECTROSURG 2X2 (MISCELLANEOUS) ×2 IMPLANT
CLOTH 2% CHLOROHEXIDINE 3PK (PERSONAL CARE ITEMS) ×2 IMPLANT
DRAPE MICROSCOPE LEICA (MISCELLANEOUS) ×2 IMPLANT
DRAPE POUCH INSTRU U-SHP 10X18 (DRAPES) ×2 IMPLANT
DRAPE SURG 17X11 SM STRL (DRAPES) ×2 IMPLANT
DRAPE UTILITY XL STRL (DRAPES) ×2 IMPLANT
DRSG AQUACEL AG ADV 3.5X 4 (GAUZE/BANDAGES/DRESSINGS) IMPLANT
DRSG AQUACEL AG ADV 3.5X 6 (GAUZE/BANDAGES/DRESSINGS) ×1 IMPLANT
DURAPREP 26ML APPLICATOR (WOUND CARE) ×2 IMPLANT
DURASEAL SPINE SEALANT 3ML (MISCELLANEOUS) ×2 IMPLANT
ELECT BLADE TIP CTD 4 INCH (ELECTRODE) ×1 IMPLANT
ELECT REM PT RETURN 9FT ADLT (ELECTROSURGICAL) ×2
ELECTRODE REM PT RTRN 9FT ADLT (ELECTROSURGICAL) ×1 IMPLANT
GLOVE BIOGEL PI IND STRL 7.5 (GLOVE) ×1 IMPLANT
GLOVE BIOGEL PI INDICATOR 7.5 (GLOVE) ×1
GLOVE SURG SS PI 7.5 STRL IVOR (GLOVE) ×2 IMPLANT
GLOVE SURG SS PI 8.0 STRL IVOR (GLOVE) ×4 IMPLANT
GOWN STRL REUS W/TWL XL LVL3 (GOWN DISPOSABLE) ×4 IMPLANT
IV CATH 14GX2 1/4 (CATHETERS) ×1 IMPLANT
KIT BASIN OR (CUSTOM PROCEDURE TRAY) ×2 IMPLANT
KIT POSITIONING SURG ANDREWS (MISCELLANEOUS) ×2 IMPLANT
MANIFOLD NEPTUNE II (INSTRUMENTS) ×2 IMPLANT
NDL SPNL 18GX3.5 QUINCKE PK (NEEDLE) ×3 IMPLANT
NEEDLE SPNL 18GX3.5 QUINCKE PK (NEEDLE) ×4 IMPLANT
PATTIES SURGICAL .5 X.5 (GAUZE/BANDAGES/DRESSINGS) IMPLANT
PATTIES SURGICAL .75X.75 (GAUZE/BANDAGES/DRESSINGS) IMPLANT
PATTIES SURGICAL 1X1 (DISPOSABLE) IMPLANT
SPONGE SURGIFOAM ABS GEL 100 (HEMOSTASIS) ×2 IMPLANT
STAPLER VISISTAT (STAPLE) ×1 IMPLANT
STRIP CLOSURE SKIN 1/2X4 (GAUZE/BANDAGES/DRESSINGS) ×2 IMPLANT
SUT NURALON 4 0 TR CR/8 (SUTURE) ×1 IMPLANT
SUT PROLENE 3 0 PS 2 (SUTURE) ×2 IMPLANT
SUT VIC AB 1 CT1 27 (SUTURE)
SUT VIC AB 1 CT1 27XBRD ANTBC (SUTURE) IMPLANT
SUT VIC AB 1-0 CT2 27 (SUTURE) ×2 IMPLANT
SUT VIC AB 2-0 CT1 27 (SUTURE)
SUT VIC AB 2-0 CT1 TAPERPNT 27 (SUTURE) IMPLANT
SUT VIC AB 2-0 CT2 27 (SUTURE) ×1 IMPLANT
SYRINGE 10CC LL (SYRINGE) ×1 IMPLANT
TOWEL OR 17X26 10 PK STRL BLUE (TOWEL DISPOSABLE) ×2 IMPLANT
TRAY FOLEY METER SIL LF 16FR (CATHETERS) ×1 IMPLANT
TRAY LAMINECTOMY (CUSTOM PROCEDURE TRAY) ×2 IMPLANT
YANKAUER SUCT BULB TIP NO VENT (SUCTIONS) ×2 IMPLANT

## 2013-08-05 NOTE — Anesthesia Postprocedure Evaluation (Signed)
Anesthesia Post Note  Patient: [name]  Procedure(s) Performed: Procedure(s) (LRB): MICRO LUMBAR DECOMPRESSION L4 - L5 (N/A)  Anesthesia type: General  Patient location: PACU  Post pain: Pain level controlled  Post assessment: Post-op Vital signs reviewed  Last Vitals: BP 122/71  Pulse 68  Temp(Src) 36.3 C (Oral)  Resp 14  Ht 5' 9.5" (1.765 m)  Wt 188 lb (85.276 kg)  BMI 27.37 kg/m2  SpO2 100%  Post vital signs: Reviewed  Level of consciousness: sedated  Complications: No apparent anesthesia complications

## 2013-08-05 NOTE — H&P (View-Only) (Signed)
[name] is an 68 y.o. male.   Chief Complaint: back and left leg pain HPI: The patient is a 68 year old male being followed for their left-sided back pain and leg pain down to the foot. Symptoms reported today include: pain, aching, pain with weightbearing, difficulty ambulating, weakness, numbness, burning and leg pain, while the patient does not report symptoms of: pain with sitting or foot drop. The patient states that they are doing poorly. The following medication has been used for pain control: Ultram. The patient reports their current pain level to be severe. The patient presents today following MRI. The patient has not gotten any relief of their symptoms with activity modification, conservative measures, Cortisone injections, NSAIDs or rest. Note for "Follow-up back": He has decided to use crutches to help him walk but this makes his back worse.  He reports increasing pain through this past weekend with no change in activity or new injury. He reports continued pain left lower back into the buttock and left posterolateral leg to the ankle with numbness in the same distribution and into the dorsal foot. The leg does also feel weak like it will give out on him. Over the last week or so he is also having pain in the anterior left thigh which "feels bruised." He denies any right sided symptoms. Reports the pain is actually most severe in the back and buttock. He has previously seen Dr. Nelva Bush and has had several ESI's, initially with relief, but over time less successful. He is taking ultram as needed for pain, but it's not helping. He did take some Norco from his wife which did help. She also has given him neurontin and lyrica which did not seem to help as much as the norco. His pain is least severe when lying down on his back with his left leg flexed up. His pain is worse sitting.   No past medical history on file.  No past surgical history on file.  No family history on file. Social  History:  has no tobacco, alcohol, and drug history on file.  Allergies: Allergies not on file   (Not in a hospital admission)  No results found for this or any previous visit (from the past 48 hour(s)). No results found.  Review of Systems  Constitutional: Negative.   HENT: Negative.   Eyes: Negative.   Respiratory: Negative.   Cardiovascular: Negative.   Gastrointestinal: Negative.   Genitourinary: Negative.   Musculoskeletal: Positive for back pain.  Skin: Negative.   Neurological: Positive for sensory change and focal weakness.    There were no vitals taken for this visit. Physical Exam  Constitutional: He is oriented to person, place, and time. He appears well-developed and well-nourished. He appears distressed.  HENT:  Head: Normocephalic and atraumatic.  Eyes: Conjunctivae and EOM are normal. Pupils are equal, round, and reactive to light.  Neck: Normal range of motion. Neck supple.  Cardiovascular: Normal rate and regular rhythm.   Respiratory: Effort normal and breath sounds normal.  GI: Soft. Bowel sounds are normal.  Musculoskeletal:  General Mental Status - Alert. General Appearance- pleasant and In acute distress. appears uncomfortable (lying down on exam table, left leg flexed up). Orientation- Oriented X3. Build & Nutrition- Well nourished and Well developed. Gait- Use of assistive device (crutches).  Abdomen Palpation/Percussion:Tenderness- Abdomen is non-tender to palpation. Rigidity (guarding)- Abdomen is soft.  Peripheral Vascular Lower Extremity: Palpation:Homan's sign- Bilateral- Negative (normal). Posterior tibial pulse- Bilateral- 2+. Dorsalis pedis pulse- Bilateral- 2+.  Neurologic Motor: Strength :Quadriceps- Left- 4/5. Right- 5/5. Hamstrings- Bilateral- 5/5. Ankle Dorsiflexion- Bilateral- 5/5. Ankle Plantarflexion- Bilateral- 5/5. Extensor Hallucis Longus- Left- 4/5. Right- 5/5. Sensation:Lower Extremity-  Left- sensation is diminished to light touch in the lower extremity (L5 distribution dorsal foot). Right- sensation is intact to light touch in the lower extremity. Reflexes:Patellar Reflex- Bilateral- 2+. Achilles Reflex- Bilateral- 2+. Babinski- Bilateral- Babinski not present. Clonus- Bilateral- clonus not present.  Musculoskeletal Spine/Ribs/Pelvis Lumbosacral Spine:Inspection and Palpation- Tenderness- no tenderness to palpation of the left flank, no tenderness to palpation of right flank, no tenderness to palpation of the left greater trochanter and no tenderness to palpation of the right greater trochanter. Swelling- none. Other characteristics- no ecchymosis, no abnormal warmth, no erythema and no evidence of cellulitis. ROM- Testing limited- due to pain. Special Testing- Lumbar- Left supine straight leg raise positive (produces buttock, posterolateral leg pain to ankle). Right supine straight leg raise negative. Lower Extremity Right Lower Extremity: Right Hip: ROM:- Full ROM of the hip and - pain-free. Right Knee: ROM:- Full ROM of the knee and - pain-free. Right Ankle: ROM:- Full and pain-free ROM of the ankle. Left Lower Extremity: Left Hip : ROM:- Full ROM of the hip and - pain-free. Left Knee: ROM:- Full ROM of the knee and - pain-free. Left Ankle: ROM:- Full and pain-free ROM of the ankle.  Lymphatic General Lymphatics Description- No Localized lymphadenopathy.  Neurological: He is oriented to person, place, and time. He has normal reflexes.  Skin: Skin is warm and dry.  Psychiatric: He has a normal mood and affect.    prior xrays reviewed by Dr. Tonita Cong with mild scoliosis, convex left curvature. Multilevel DDD. There is a trace listhesis at L4-5 which appears stable in flexion and extension. Mild aortic calcification noted. Hips unremarkable.  MRI Lspine images and report reviewed today by Dr. Tonita Cong with severe left foraminal  stenosis L4-5 secondary to ligamentum flavum hypertrophy, facet arthrosis, and disc bulge/osteophyte complex, with mass effect left L4 nerve root. Moderate to severe right foraminal stenosis at this level without mass effect L4 nerve root. There is also moderate foraminal stenosis at L3-4 bilaterally causing mass effect left L3 root, multifactorial as noted at L4-5. Schmorl's nodes also noted L3-4.  Assessment/Plan Spinal stenosis L4-5, L3-4  Pt with ongoing, progressively worsening left sided LBP and LLE pain, myotomal weakness, dermatomal dysthesias, secondary to multilevel spinal stenosis (L3-4, L4-5), refractory to ESI, SNRB, activity modifications, relative rest, pain medications. His LLE symptoms appear to be in the L4 and L5 distribution, with new L anterior thigh pain, L3 distribution. We discussed relevant anatomy, the etiology of his pain, reviewed his studies in detail. He has multilevel foraminal stenosis, most significant at L4-5, but moderate at L3-4 as well, and although L4-5 seems to be causing the majority of his symptoms, L3-4 seems to be symptomatic as well, especially in the last week. Given his ongoing and worsening pain, failure to improve with conservative tx, ongoing neural tension signs and weakness on exam, recommend microlumbar decompression L4-5, possible L3-4. We discussed the procedure itself as well as risks, complications and alternatives including but not limited to DVT, PE, infx, bleeding, failure of procedure, need for secondary procedure, anesthesia risk, CSF leak, dural tear, even death. Discussed post-op protocols, time in hospital, Lspine precautions, activity modifications, need for post-op PT. He denies hx of DVT or MRSA. We also discussed that this surgery is done to make an unbearable situation bearable and will relieve pressure from the nerve but  will not tx DDD in his back. It will take time for the nerve to heal and for any numbness, tingling, and weakness to  improve. We discussed possible need for future spine fusion if refractory, although would not recommend concomitant fusion at this time. All his questions were answered and he desires to proceed with surgery. Will need to obtain pre-op clearance from his PCP Windell Hummingbird PA-C with [facility]), vascular specialist (Dr. Maryjean Morn), cardiologist (Dr. Otho Perl), and nephrologist (Dr. Alba Destine). Letters for clearance for all these specialists were given today. Once clearance is obtained, will proceed accordingly with scheduling surgery. In the interim, will change pain meds to Norco instead of Ultram. Discussed neurontin, he did not seem to think it helped before, will hold off. Continue activity modifications. He will follow up 10-14 days post-op for staple removal and plan to initiate PT for gentle neural stretching at that time. He will call with any questions or concerns in the interim.  Plan microlumbar decompression L4-5, L3-4  BISSELL, JACLYN M. for Dr. Tonita Cong 07/24/2013, 2:58 PM

## 2013-08-05 NOTE — Brief Op Note (Signed)
08/05/2013  3:20 PM  PATIENT:  [name]  68 y.o. male  PRE-OPERATIVE DIAGNOSIS:  spinal stenosis lumbar four to five, lumbar three to four  POST-OPERATIVE DIAGNOSIS:  spinal stenosis lumbar four to five, lumbar three to four  PROCEDURE:  Procedure(s): MICRO LUMBAR DECOMPRESSION L4 - L5 (N/A)  SURGEON:  Surgeon(s) and Role:    * Johnn Hai, MD - Primary  PHYSICIAN ASSISTANT:   ASSISTANTS: Bissell   ANESTHESIA:   general  EBL:  Total I/O In: 1000 [I.V.:1000] Out: -   BLOOD ADMINISTERED:none  DRAINS: none   LOCAL MEDICATIONS USED:  MARCAINE     SPECIMEN:  Source of Specimen:  L45  DISPOSITION OF SPECIMEN:  PATHOLOGY  COUNTS:  YES  TOURNIQUET:  * No tourniquets in log *  DICTATION: .Other Dictation: Dictation Number [id number]  PLAN OF CARE: Admit for overnight observation  PATIENT DISPOSITION:  PACU - hemodynamically stable.   Delay start of Pharmacological VTE agent (>24hrs) due to surgical blood loss or risk of bleeding: yes

## 2013-08-05 NOTE — Transfer of Care (Signed)
Immediate Anesthesia Transfer of Care Note  Patient: [name]  Procedure(s) Performed: Procedure(s) (LRB): MICRO LUMBAR DECOMPRESSION L4 - L5 (N/A)  Patient Location: PACU  Anesthesia Type: General  Level of Consciousness: sedated, patient cooperative and responds to stimulation  Airway & Oxygen Therapy: Patient Spontanous Breathing and Patient connected to face mask oxgen  Post-op Assessment: Report given to PACU RN and Post -op Vital signs reviewed and stable  Post vital signs: Reviewed and stable  Complications: No apparent anesthesia complications

## 2013-08-05 NOTE — Discharge Instructions (Signed)
Walk As Tolerated utilizing back precautions.  No bending, twisting, or lifting.  No driving for 2 weeks.   Aquacel dressing may remain in place for 7 days. May shower with aquacel dressing in place. After 7 days, remove aquacel dressing and place gauze and tape dressing which should be kept clean and dry and changed daily. Do not remove steri-strips if they are present. See Dr. Tonita Cong in office in 10 to 14 days. Begin taking aspirin 81mg  per day starting 4 days after your surgery if not allergic to aspirin or on another blood thinner. Walk daily even outside. Use a cane or walker only if necessary. Avoid sitting on soft sofas. Resume plavix in 6 days

## 2013-08-05 NOTE — Anesthesia Preprocedure Evaluation (Addendum)
Anesthesia Evaluation  Patient identified by MRN, date of birth, ID band Patient awake    Reviewed: Allergy & Precautions, H&P , NPO status , Patient's Chart, lab work & pertinent test results  History of Anesthesia Complications (+) PONV  Airway Mallampati: II TM Distance: >3 FB Neck ROM: Full    Dental no notable dental hx.    Pulmonary neg pulmonary ROS, former smoker,  breath sounds clear to auscultation  Pulmonary exam normal       Cardiovascular hypertension, Pt. on medications + angina + CAD, + Cardiac Stents and + Peripheral Vascular Disease negative cardio ROS  Rhythm:Regular Rate:Normal     Neuro/Psych negative neurological ROS  negative psych ROS   GI/Hepatic negative GI ROS, Neg liver ROS,   Endo/Other  negative endocrine ROSdiabetes, Type 2, Oral Hypoglycemic Agents  Renal/GU negative Renal ROS  negative genitourinary   Musculoskeletal negative musculoskeletal ROS (+)   Abdominal   Peds negative pediatric ROS (+)  Hematology negative hematology ROS (+)   Anesthesia Other Findings   Reproductive/Obstetrics negative OB ROS                          Anesthesia Physical Anesthesia Plan  ASA: III  Anesthesia Plan: General   Post-op Pain Management:    Induction: Intravenous  Airway Management Planned: Oral ETT  Additional Equipment:   Intra-op Plan:   Post-operative Plan: Extubation in OR  Informed Consent: I have reviewed the patients History and Physical, chart, labs and discussed the procedure including the risks, benefits and alternatives for the proposed anesthesia with the patient or authorized representative who has indicated his/her understanding and acceptance.   Dental advisory given  Plan Discussed with: CRNA  Anesthesia Plan Comments:         Anesthesia Quick Evaluation

## 2013-08-05 NOTE — Interval H&P Note (Signed)
History and Physical Interval Note:  08/05/2013 10:16 AM  [name]  has presented today for surgery, with the diagnosis of spinal stenosis L4 - L5 and L3 - L4  The various methods of treatment have been discussed with the patient and family. After consideration of risks, benefits and other options for treatment, the patient has consented to  Procedure(s): MICRO LUMBAR DECOMPRESSION L4 - L5, Possible L3 - L4  2 LEVELS (N/A) as a surgical intervention .  The patient's history has been reviewed, patient examined, no change in status, stable for surgery.  I have reviewed the patient's chart and labs.  Questions were answered to the patient's satisfaction.     Sharlette Jansma C

## 2013-08-05 NOTE — Preoperative (Signed)
Beta Blockers   Reason not to administer Beta Blockers:Not Applicable, took coreg this am 

## 2013-08-06 ENCOUNTER — Encounter (HOSPITAL_COMMUNITY): Payer: Self-pay | Admitting: Specialist

## 2013-08-06 LAB — BASIC METABOLIC PANEL
BUN: 17 mg/dL (ref 6–23)
CHLORIDE: 102 meq/L (ref 96–112)
CO2: 23 meq/L (ref 19–32)
CREATININE: 1.3 mg/dL (ref 0.50–1.35)
Calcium: 8.8 mg/dL (ref 8.4–10.5)
GFR calc Af Amer: 63 mL/min — ABNORMAL LOW (ref >90)
GFR calc non Af Amer: 54 mL/min — ABNORMAL LOW (ref >90)
Glucose, Bld: 205 mg/dL — ABNORMAL HIGH (ref 70–99)
Potassium: 4.6 mEq/L (ref 3.7–5.3)
Sodium: 139 mEq/L (ref 137–147)

## 2013-08-06 LAB — GLUCOSE, CAPILLARY
Glucose-Capillary: 196 mg/dL — ABNORMAL HIGH (ref 70–99)
Glucose-Capillary: 157 mg/dL — ABNORMAL HIGH (ref 70–99)

## 2013-08-06 LAB — CBC
HEMATOCRIT: 37.3 % — AB (ref 39.0–52.0)
Hemoglobin: 13.1 g/dL (ref 13.0–17.0)
MCH: 31 pg (ref 26.0–34.0)
MCHC: 35.1 g/dL (ref 30.0–36.0)
MCV: 88.4 fL (ref 78.0–100.0)
Platelets: 176 10*3/uL (ref 150–400)
RBC: 4.22 MIL/uL (ref 4.22–5.81)
RDW: 12.6 % (ref 11.5–15.5)
WBC: 12.7 10*3/uL — ABNORMAL HIGH (ref 4.0–10.5)

## 2013-08-06 MED ORDER — ASPIRIN EC 81 MG PO TBEC: 81.0000 mg | DELAYED_RELEASE_TABLET | Freq: Every morning | ORAL | Status: DC

## 2013-08-06 MED ORDER — CLOPIDOGREL BISULFATE 75 MG PO TABS: 75.0000 mg | ORAL_TABLET | Freq: Every day | ORAL | Status: DC

## 2013-08-06 NOTE — Progress Notes (Signed)
Pt stable, scripts, d/c instructions given with no questions/concerns voiced by pt or wife.  Pt transported via wheelchair to private vehicle by NT and wife. 

## 2013-08-06 NOTE — Discharge Summary (Signed)
Physician Discharge Summary   Patient ID: [name] MRN: [id number] DOB/AGE: Oct 18, 1945 68 y.o.  Admit date: 08/05/2013 Discharge date: 08/06/2013  Primary Diagnosis:   spinal stenosis lumbar four to five, lumbar three to four  Admission Diagnoses:  Past Medical History  Diagnosis Date  . PONV (postoperative nausea and vomiting)   . Diabetes mellitus without complication   . Hypertension   . Dysrhythmia 07-31-13     hx. PAC's   . Coronary artery disease 07-31-13    100 % blockage in left leg artery,affects left arm b/p readings  . Anginal pain 07-31-13    coronary stent x1 last 12'14  . Heart murmur   . Peripheral vascular disease   . GERD (gastroesophageal reflux disease)   . History of kidney stones 07-31-13    x 1  . 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 Bursitis 07-31-13    heel  . Arthritis     osteoarthritis  . Plantar fasciitis of left foot 07-31-13    left leg  . Cancer 07-31-13    skin cancer left cheek   Discharge Diagnoses:   Active Problems:   Spinal stenosis of lumbar region with radiculopathy  Procedure:  Procedure(s) (LRB): MICRO LUMBAR DECOMPRESSION L4 - L5 (N/A)   Consults: None  HPI:  see H&P    Laboratory Data: Hospital Outpatient Visit on 07/31/2013  Component Date Value Ref Range Status  . Sodium 07/31/2013 142  137 - 147 mEq/L Final  . Potassium 07/31/2013 4.5  3.7 - 5.3 mEq/L Final  . Chloride 07/31/2013 103  96 - 112 mEq/L Final  . CO2 07/31/2013 28  19 - 32 mEq/L Final  . Glucose, Bld 07/31/2013 117* 70 - 99 mg/dL Final  . BUN 07/31/2013 17  6 - 23 mg/dL Final  . Creatinine, Ser 07/31/2013 1.43* 0.50 - 1.35 mg/dL Final  . Calcium 07/31/2013 10.1  8.4 - 10.5 mg/dL Final  . GFR calc non Af Amer 07/31/2013 48* >90 mL/min Final  . GFR calc Af Amer 07/31/2013 56* >90 mL/min Final   Comment: (NOTE)                          The eGFR has been calculated using the CKD EPI equation.                   This calculation has not been validated in all clinical situations.                          eGFR's persistently <90 mL/min signify possible Chronic Kidney                          Disease.  . WBC 07/31/2013 6.8  4.0 - 10.5 K/uL Final  . RBC 07/31/2013 5.04  4.22 - 5.81 MIL/uL Final  . Hemoglobin 07/31/2013 15.9  13.0 - 17.0 g/dL Final  . HCT 07/31/2013 44.3  39.0 - 52.0 % Final  . MCV 07/31/2013 87.9  78.0 - 100.0 fL Final  . MCH 07/31/2013 31.5  26.0 - 34.0 pg Final  . MCHC 07/31/2013 35.9  30.0 - 36.0 g/dL Final  . RDW 07/31/2013 12.7  11.5 - 15.5 % Final  . Platelets 07/31/2013 207  150 - 400 K/uL Final  . MRSA, PCR 07/31/2013 NEGATIVE  NEGATIVE Final  .  Staphylococcus aureus 07/31/2013 POSITIVE* NEGATIVE Final   Comment:                                 The Xpert SA Assay (FDA                          approved for NASAL specimens                          in patients over 1 years of age),                          is one component of                          a comprehensive surveillance                          program.  Test performance has                          been validated by [employer] for patients greater                          than or equal to 70 year old.                          It is not intended                          to diagnose infection nor to                          guide or monitor treatment.    Recent Labs  08/06/13 0440  HGB 13.1    Recent Labs  08/06/13 0440  WBC 12.7*  RBC 4.22  HCT 37.3*  PLT 176    Recent Labs  08/06/13 0440  NA 139  K 4.6  CL 102  CO2 23  BUN 17  CREATININE 1.30  GLUCOSE 205*  CALCIUM 8.8   No results found for this basename: LABPT, INR,  in the last 72 hours  X-Rays:Dg Lumbar Spine 2-3 Views  07/31/2013   CLINICAL DATA:  Low back and left leg pain.  EXAM: LUMBAR SPINE - 2-3 VIEW  COMPARISON:  CT scan abdomen and pelvis dated 07/04/2010  FINDINGS: There is a lumbar  scoliosis with convexity to the left centered at L3-4. The patient has lumbarization of the S1 segment. The patient has 12 rib-bearing vertebra.  There is fairly extensive calcification in the abdominal aorta and common iliac arteries.  IMPRESSION: Lumbarization of the S1 segment. Radiographs are labeled considering this anomaly.   Electronically Signed   By: Rozetta Nunnery M.D.   On: 07/31/2013 15:09   Dg Spine Portable 1 View  08/05/2013   CLINICAL DATA:  Intraoperative localization in the lumbar spine  EXAM: PORTABLE SPINE - 1 VIEW  COMPARISON:  DG SPINE PORTABLE 1 VIEW dated 08/05/2013; DG SPINE PORTABLE  1 VIEW dated 08/05/2013; DG LUMBAR SPINE 2-3 VIEWS dated 07/31/2013; Jana Half- FLAT & UPRIGHT dated 04/08/2012  FINDINGS: A transitional lumbosacral vertebra is assumed to represent the S1 level. This is the same numbering used on the earlier radiographs today.  Tissue spreaders are in place at the L5-S1 level. This assumes S1 as the transitional vertebra. Two probes are present, with the upper anterior blunt tip probe oriented towards the inferior endplate of L5 and the long axis of the curved tip probe inferiorly oriented towards the upper endplate of S1.  IMPRESSION: 1. Localization at the L5-S1 level, assuming that S1 is the transitional vertebra.   Electronically Signed   By: Sherryl Barters M.D.   On: 08/05/2013 15:36   Dg Spine Portable 1 View  08/05/2013   CLINICAL DATA:  Surgical level L4-5.  Possible L3-4  EXAM: PORTABLE SPINE - 1 VIEW  COMPARISON:  08/05/2013  FINDINGS: Surgical clamp overlies the spinous process of L4. Tissue spreaders are present at L5-S1. There is a clamp overlying the spinous process of S1  IMPRESSION: Surgical localization as above.   Electronically Signed   By: Franchot Gallo M.D.   On: 08/05/2013 14:22   Dg Spine Portable 1 View  08/05/2013   CLINICAL DATA:  Surgical level L4-5, possible L3-4  EXAM: PORTABLE SPINE - 1 VIEW  COMPARISON:  Two-view lumbar spine 07/31/2013   FINDINGS: S1 is partially lumbarized as noted on the prior study.  There is a needle directed at the L3 spinous process  Second needle directed at the L4 spinous process.  Normal alignment and no fracture.  IMPRESSION: S1 is lumbarized.  Localization of the L3 and L4 spinous processes.   Electronically Signed   By: Franchot Gallo M.D.   On: 08/05/2013 14:02    EKG:No orders found for this or any previous visit.   Hospital Course: Patient was admitted to [facility] and taken to the OR and underwent the above state procedure without complications.  Patient tolerated the procedure well and was later transferred to the recovery room and then to the orthopaedic floor for postoperative care.  They were given PO and IV analgesics for pain control following their surgery.  They were given 24 hours of postoperative antibiotics.   PT was consulted postop to assist with mobility and transfers.  The patient was allowed to be WBAT with therapy and was taught back precautions. Discharge planning was consulted to help with postop disposition and equipment needs.  Patient had a good night on the evening of surgery and started to get up OOB with therapy on day one. Patient was seen in rounds and was ready to go home on day one.  They were given discharge instructions and dressing directions.  They were instructed on when to follow up in the office with Dr. Tonita Cong.  Discharge Medications: Prior to Admission medications   Medication Sig Start Date End Date Taking? Authorizing Provider  atorvastatin (LIPITOR) 20 MG tablet Take 20 mg by mouth at bedtime.   Yes Historical Provider, MD  carvedilol (COREG) 3.125 MG tablet Take 3.125 mg by mouth 2 (two) times daily with a meal.   Yes Historical Provider, MD  glipiZIDE (GLUCOTROL) 5 MG tablet Take 5 mg by mouth daily before breakfast.   Yes Historical Provider, MD  omeprazole (PRILOSEC) 20 MG capsule Take 20 mg by mouth 2 (two) times daily before a meal.   Yes  Historical Provider, MD  aspirin EC 81 MG tablet Take 1  tablet (81 mg total) by mouth every morning. 08/06/13   Dayna Barker. Honesti Seaberg, PA-C  CALCIUM-VITAMIN D PO Take 1 tablet by mouth 2 (two) times daily.    Historical Provider, MD  clopidogrel (PLAVIX) 75 MG tablet Take 1 tablet (75 mg total) by mouth daily with breakfast. 08/06/13   Dayna Barker. Tajh Livsey, PA-C  docusate sodium (COLACE) 100 MG capsule Take 1 capsule (100 mg total) by mouth 2 (two) times daily as needed for mild constipation. 08/05/13   Javier Docker, MD  HYDROcodone-acetaminophen (NORCO/VICODIN) 5-325 MG per tablet Take 1-2 tablets by mouth every 4 (four) hours as needed. 08/05/13   Javier Docker, MD  Omega-3 Fatty Acids (FISH OIL) 1200 MG CPDR Take 1,200 mg by mouth daily.    Historical Provider, MD  Vitamin D, Ergocalciferol, (DRISDOL) 50000 UNITS CAPS capsule Take 50,000 Units by mouth every Wednesday.    Historical Provider, MD    Diet: Diabetic diet Activity:WBAT; Lspine precautions Follow-up:in 10-14 days Disposition - Home Discharged Condition: good   Discharge Orders   Future Orders Complete By Expires   Call MD / Call 911  As directed    Comments:     If you experience chest pain or shortness of breath, CALL 911 and be transported to the hospital emergency room.  If you develope a fever above 101 F, pus (white drainage) or increased drainage or redness at the wound, or calf pain, call your surgeon's office.   Constipation Prevention  As directed    Comments:     Drink plenty of fluids.  Prune juice may be helpful.  You may use a stool softener, such as Colace (over the counter) 100 mg twice a day.  Use MiraLax (over the counter) for constipation as needed.   Diet - low sodium heart healthy  As directed    Increase activity slowly as tolerated  As directed        Medication List         aspirin EC 81 MG tablet  Take 1 tablet (81 mg total) by mouth every morning.     atorvastatin 20 MG tablet  Commonly known as:   LIPITOR  Take 20 mg by mouth at bedtime.     CALCIUM-VITAMIN D PO  Take 1 tablet by mouth 2 (two) times daily.     carvedilol 3.125 MG tablet  Commonly known as:  COREG  Take 3.125 mg by mouth 2 (two) times daily with a meal.     clopidogrel 75 MG tablet  Commonly known as:  PLAVIX  Take 1 tablet (75 mg total) by mouth daily with breakfast.     docusate sodium 100 MG capsule  Commonly known as:  COLACE  Take 1 capsule (100 mg total) by mouth 2 (two) times daily as needed for mild constipation.     Fish Oil 1200 MG Cpdr  Take 1,200 mg by mouth daily.     glipiZIDE 5 MG tablet  Commonly known as:  GLUCOTROL  Take 5 mg by mouth daily before breakfast.     HYDROcodone-acetaminophen 5-325 MG per tablet  Commonly known as:  NORCO/VICODIN  Take 1-2 tablets by mouth every 4 (four) hours as needed.     omeprazole 20 MG capsule  Commonly known as:  PRILOSEC  Take 20 mg by mouth 2 (two) times daily before a meal.     Vitamin D (Ergocalciferol) 50000 UNITS Caps capsule  Commonly known as:  DRISDOL  Take 50,000 Units by mouth  every Wednesday.           Follow-up Information   Follow up with BEANE,JEFFREY C, MD In 2 weeks.   Specialty:  Orthopedic Surgery   Contact information:   [address] [place] [address] [phone]       Signed: Cecilie Kicks. 08/06/2013, 11:22 AM

## 2013-08-06 NOTE — Progress Notes (Signed)
   CARE MANAGEMENT NOTE 08/06/2013  Patient:  KWESI, SANGHA   Account Number:  [id number]  Date Initiated:  08/06/2013  Documentation initiated by:  [facility]  Subjective/Objective Assessment:   MICRO LUMBAR DECOMPRESSION L4 - L5     Action/Plan:   no HH needs identified.   Anticipated DC Date:  08/06/2013   Anticipated DC Plan:  [place]  CM consult      Choice offered to / List presented to:             Status of service:  Completed, signed off Medicare Important Message given?   (If response is "NO", the following Medicare IM given date fields will be blank) Date Medicare IM given:   Date Additional Medicare IM given:    Discharge Disposition:  HOME/SELF CARE  Per UR Regulation:    If discussed at Long Length of Stay Meetings, dates discussed:    Comments:  08/06/2013 1200 NCM spoke to pt and he has RW at home. Jonnie Finner RN CCM Case Mgmt phone [phone]

## 2013-08-06 NOTE — Evaluation (Signed)
Physical Therapy Evaluation Patient Details Name: [name] MRN: [id number] DOB: March 02, 1946 Today's Date: 08/06/2013 Time: 0900-0930 PT Time Calculation (min): 30 min  PT Assessment / Plan / Recommendation History of Present Illness  pt was admitted for L4-5 microdiskectomy  Clinical Impression  Pt s/p back surgery presents with functional mobility limitations 2* post op pain and back precautions.  Pt presently mobilizing at min guard/SUp level and plans d/c home with assist of family.    PT Assessment  Patent does not need any further PT services    Follow Up Recommendations  No PT follow up    Does the patient have the potential to tolerate intense rehabilitation      Barriers to Discharge        Equipment Recommendations  None recommended by PT    Recommendations for Other Services OT consult   Frequency      Precautions / Restrictions Precautions Precautions: Back Precaution Booklet Issued: Yes (comment) Restrictions Weight Bearing Restrictions: No   Pertinent Vitals/Pain 3/10      Mobility  Bed Mobility Overal bed mobility:  (Pt states he is comfortable with ability following OT sessio) Transfers Overall transfer level: Needs assistance Equipment used: None Transfers: Sit to/from Stand Sit to Stand: Supervision General transfer comment: min cues for transition position and use of UEs to self assist Ambulation/Gait Ambulation/Gait assistance: Min guard;Supervision Ambulation Distance (Feet): 600 Feet Assistive device: Rolling walker (2 wheeled);None Gait Pattern/deviations: WFL(Within Functional Limits);Shuffle General Gait Details: Min cues for posture.  Pt ambulated 17' with RW and Sup and additional 150' with no AD and min guard Stairs: Yes Stairs assistance: Min assist Stair Management: No rails;Step to pattern;Forwards Number of Stairs: 5 General stair comments: HHA and cues for sequence.  Pt states his son can assist him on stairs     Exercises     PT Diagnosis:    PT Problem List:   PT Treatment Interventions:       PT Goals(Current goals can be found in the care plan section) Acute Rehab PT Goals Patient Stated Goal: Resume previous lifestyle with decreased pain  Visit Information  Last PT Received On: 08/06/13 Assistance Needed: +1 History of Present Illness: pt was admitted for L4-5 microdiskectomy       Prior [place] expects to be discharged to:: Private residence Living Arrangements: Spouse/significant other Available Help at Discharge: Family Type of Home: House Home Access: Stairs to enter Technical brewer of Steps: 2 Home Layout: One [place] - single point;Shower seat - built in;Walker - standard Additional Comments: Pt states he will have wheels fit to his SW if he feels he needs it Prior Function Level of Independence: Independent Communication Communication: No difficulties    Cognition  Cognition Arousal/Alertness: Awake/alert Behavior During Therapy: WFL for tasks assessed/performed Overall Cognitive Status: Within Functional Limits for tasks assessed    Extremity/Trunk Assessment Upper Extremity Assessment Upper Extremity Assessment: Overall WFL for tasks assessed   Balance    End of Session PT - End of Session Activity Tolerance: Patient tolerated treatment well Patient left: in chair;with call bell/phone within reach Nurse Communication: Mobility status  GP Functional Assessment Tool Used: Clinical judgement Functional Limitation: Mobility: Walking and moving around Mobility: Walking and Moving Around Current Status (H0623): At least 1 percent but less than 20 percent impaired, limited or restricted Mobility: Walking and Moving Around Goal Status [phone]): At least 1 percent but less than 20 percent impaired, limited or restricted Mobility:  Walking and Moving Around Discharge Status [phone]): At least 1 percent but less  than 20 percent impaired, limited or restricted   [facility] 08/06/2013, 12:42 PM

## 2013-08-06 NOTE — Progress Notes (Signed)
Subjective: 1 Day Post-Op Procedure(s) (LRB): MICRO LUMBAR DECOMPRESSION L4 - L5 (N/A) Patient reports pain as mild.  Reports mild incisional pain, resolution of leg pain. He has noted some slight tingling in his legs. Has been OOB walking to nurse station without difficulty. Foley has been removed, he is voiding without complications. Denies CP, SOB, N/V, doing well, feels ready to go home today.  Objective: Vital signs in last 24 hours: Temp:  [96.5 F (35.8 C)-98 F (36.7 C)] 97.5 F (36.4 C) (03/19 0529) Pulse Rate:  [55-68] 64 (03/19 0529) Resp:  [9-18] 16 (03/19 0529) BP: (115-183)/(47-98) 163/74 mmHg (03/19 0529) SpO2:  [98 %-100 %] 98 % (03/19 0529) Weight:  [85.276 kg (188 lb)] 85.276 kg (188 lb) (03/18 1740)  Intake/Output from previous day: 03/18 0701 - 03/19 0700 In: 3416.3 [P.O.:600; I.V.:2716.3; IV Piggyback:100] Out: 2200 [Urine:2150; Blood:50] Intake/Output this shift:     Recent Labs  08/06/13 0440  HGB 13.1    Recent Labs  08/06/13 0440  WBC 12.7*  RBC 4.22  HCT 37.3*  PLT 176    Recent Labs  08/06/13 0440  NA 139  K 4.6  CL 102  CO2 23  BUN 17  CREATININE 1.30  GLUCOSE 205*  CALCIUM 8.8   No results found for this basename: LABPT, INR,  in the last 72 hours  Neurologically intact ABD soft Neurovascular intact Sensation intact distally Intact pulses distally Dorsiflexion/Plantar flexion intact Incision: dressing C/D/I and no drainage No cellulitis present Compartment soft no calf pain or sign of DVT  Assessment/Plan: 1 Day Post-Op Procedure(s) (LRB): MICRO LUMBAR DECOMPRESSION L4 - L5 (N/A) Advance diet Up with therapy D/C IV fluids Discussed D/C instructions, Lspine precautions Follow up 10-14 days for staple removal Will discuss with Dr. Mliss Fritz, Conley Rolls. 08/06/2013, 7:38 AM

## 2013-08-06 NOTE — Op Note (Signed)
NAMEJESTEN, CAPPUCCIO NO.:  [id number]  MEDICAL RECORD NO.:  [id number]  LOCATION:  4081                         FACILITY:  [facility]  PHYSICIAN:  Susa Day, M.D.    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  PROCEDURE PERFORMED: 1. Microlumbar decompression at L3-4 and L4-5. 2. Foraminotomies L3, L4, and L5 left. 3. Microdiscectomy L4-5, left. 4. Removal of the central lamina.  ANESTHESIA:  General.  ASSISTANT:  Cleophas Dunker, PA.  HISTORY:  This is a 68 year old male with severe left lower extremity radicular pain at L4-L5 nerve root distribution secondary to ligamentum flavum hypertrophy, spinal stenosis, and HNP.  He had a lumbar scoliosis, however, his symptomatic side was on the convexity of the curve.  With the HNP in the lateral recess on the convexity of the curve, it was felt that the decompression at those 2 levels would afford relief of his L4-L5 radicular pain without destabilizing the scoliosis, and therefore without the need for multilevel fusion.  We discussed risks and benefits, given his comorbidities as well including bleeding, infection, damage to neurovascular structures, DVT, PE, and anesthetic complications, et Ronney Asters.  TECHNIQUE:  With the patient in supine position, after induction of adequate general anesthesia, 2 g of Kefzol was placed prone on the Stanford frame.  All bony prominences were well padded.  Lumbar region was prepped and draped in usual sterile fashion.  Two 18-gauge spinal needle was utilized to localize the L3-4 interspace.  The x-rays were labeled by Radiology at [facility].  The last open disk space was designated  transitional segment, and therefore, L5-S1 was second to the last open disk space.  Decompression by MRI nomenclature indicated the last open disk space at L5-S1, and the pathology at L3-4 and L4-5, there were Schmorl's nodes.  There were nodes and Modic changes at L3-4 noted on the MRI report, which was the 3rd to the last open space.  Incision was made from spinous process of 3 to below 5, subcutaneous tissue was dissected.  Electrocautery was utilized to achieve hemostasis.  A 0.25% Marcaine with epinephrine was infiltrated in the paraspinous musculature.  Incision was made from spinous process of 3 to below 5, subcutaneous tissue was dissected.  Electrocautery was utilized to achieve hemostasis.  Fascia lata was identified and divided in line with skin incision.  Paraspinous muscle was elevated from lamina at L3-4 and at L4-5.  McCullough retractor was placed.  Confirmatory radiograph was obtained.  Operating microscope was draped while on the surgical field. Small interlaminar windows were noted and removed the spinous process of 4.  Initiated decompression at L4-5 utilizing 2-mm Kerrison in the central portion of the lamina.  We detached ligamentum flavum from the caudad edge of 4.  We placed patty beneath the ligamentum flavum and protected it with a neuroprobe.  We removed the central lamina of 4 and hypertrophic ligamentum laterally particularly  on facet of L4-5 and at L3-4.  We decompressed the lateral recess to the medial border of the pedicle preserving the facet.  Compression of the L4 and L5 roots noted. There was an HNP at 4-5, free fragment was noted as well.  Bipolar electrocautery was utilized to achieve hemostasis.  Interestingly, there was an aberrant take-off of what  appeared to be 3 of the 4 root that ran along the thecal sac and then exited distally through 5.  The HNP was at 4-5.  As we gently mobilized the thecal sac, we performed a foraminotomy of 5, mobilized  the 5 root medially and a focal extruded fragment was retrieved with a pituitary and further fragments were mobilized with a nerve hook and sutured and sent to pathology. Foraminotomies of 4 and 3 were performed as well.  There was stenosis noted at the foramen of 4 and of 3.  There was no disk herniation at L3- 4 noted, and following that neuroprobe passed freely at foramen of L3, 4, and 5.  There did not appear to be disk herniation at 3-4.  No probe was placed on the contralateral side at 3-4 and 4-5, demonstrated no significant stenosis.  Felt we had a good restoration of thecal sac.  We felt there was significant compression of the L5 and L4 nerve roots by the pathology seen consistent with that seen on the MRI and the patient's symptoms on the left.  We felt this was decompressed without destabilizing the scoliosis.  Wound was copiously irrigated with the disk space.  Inspection revealed no evidence of CSF leakage or active bleeding.  We obtained a confirmatory radiograph with a neuroprobe in the foramen of L5 and 4.  Next, McCullough retractor was removed, irrigated paraspinous musculature.  No active bleeding.  We closed the fascia with 1 Vicryl interrupted, subcu with 2, and skin with staples.  Wound was dressed sterilely.  Placed supine on hospital bed, extubated without difficulty, and transported to the recovery room in satisfactory condition.  The patient tolerated the procedure well.  No complications.  Assistant, Cleophas Dunker, PA was necessary for intermittent neural traction, patient positioning up under the operating microscope, suction, et Ronney Asters.  Blood loss 50 mL.     Susa Day, M.D.     Geralynn Rile  D:  08/05/2013  T:  08/06/2013  Job:  [id number]

## 2013-08-06 NOTE — Evaluation (Signed)
Occupational Therapy Evaluation Patient Details Name: [name] MRN: [id number] DOB: [birth year] Today's Date: 08/06/2013 Time: 4580-9983 OT Time Calculation (min): 28 min  OT Assessment / Plan / Recommendation History of present illness pt was admitted for L4-5 microdiskectomy   Clinical Impression   Pt was admitted for the above surgery. All education was completed.  Pt does not need any further OT at this time    OT Assessment  Patient does not need any further OT services    Follow Up Recommendations  No OT follow up    Barriers to Discharge      Equipment Recommendations  None recommended by OT    Recommendations for Other Services    Frequency       Precautions / Restrictions Precautions Precautions: Back Precaution Booklet Issued: Yes (comment) Restrictions Weight Bearing Restrictions: No   Pertinent Vitals/Pain Back sore; repositioned    ADL  Grooming: Supervision/safety Where Assessed - Grooming: Supported standing Upper Body Bathing: Supervision/safety Where Assessed - Upper Body Bathing: Unsupported sitting Lower Body Bathing: Min guard (with long sponge) Where Assessed - Lower Body Bathing: Supported sit to stand Upper Body Dressing: Supervision/safety Where Assessed - Upper Body Dressing: Unsupported sitting Lower Body Dressing: Moderate assistance Where Assessed - Lower Body Dressing:  (lying to sitting) Toilet Transfer: Minimal assistance Toilet Transfer Method: Sit to stand Toilet Transfer Equipment: Comfort height toilet Toileting - Clothing Manipulation and Hygiene: Min guard Where Assessed - Toileting Clothing Manipulation and Hygiene: Sit to stand from 3-in-1 or toilet Transfers/Ambulation Related to ADLs: did not use walker getting to bathroom:  pt felt a little woozy:  will have insulin this am.  Talked to PT about trying walker--pt has this at home.  Pt ambulated to nurses station earlier. ADL Comments: Educated on adls and AE.  Pt  started pants in supine but still needed min A with them.  Feels he will be able to wipe.  Reviewed adaptations to maintain back precautions and handout given Reviewed stepping into shower backwards if walker needed.   OT Diagnosis:    OT Problem List:   OT Treatment Interventions:     OT Goals(Current goals can be found in the care plan section)    Visit Information  Last OT Received On: 08/06/13 Assistance Needed: +1 History of Present Illness: pt was admitted for L4-5 microdiskectomy       Prior [place] expects to be discharged to:: Private residence Living Arrangements: Spouse/significant other Available Help at Discharge: Family Type of Home: House Home Access: Stairs to enter Technical brewer of Steps: 2 Home Layout: One [place]: Environmental consultant - 2 wheels;Cane - single point;Shower seat - built in Additional Comments: has long sponge Prior Function Level of Independence: Independent Communication Communication: No difficulties         Vision/Perception     Cognition  Cognition Arousal/Alertness: Awake/alert Behavior During Therapy: WFL for tasks assessed/performed Overall Cognitive Status: Within Functional Limits for tasks assessed    Extremity/Trunk Assessment Upper Extremity Assessment Upper Extremity Assessment: Overall WFL for tasks assessed     Mobility Bed Mobility Overal bed mobility: Needs Assistance Bed Mobility: Sit to Supine;Sit to Sidelying Sit to supine: Supervision Sit to sidelying: Supervision General bed mobility comments: pt moves quickly:  one cue for sequence as he tried to push up before legs were off bed Transfers Overall transfer level: Needs assistance Equipment used: None Transfers: Sit to/from Stand Sit to Stand: Min guard General transfer  comment: one cue for keeping back straight when donning pants    Exercise     Balance     End of Session OT - End of Session Activity  Tolerance: Patient tolerated treatment well Patient left: in chair;with call bell/phone within reach  [place] 08/06/2013, 8:33 AM Lesle Chris, OTR/L [phone] 08/06/2013

## 2014-07-30 ENCOUNTER — Encounter (HOSPITAL_COMMUNITY): Payer: Self-pay | Admitting: Emergency Medicine

## 2014-07-30 ENCOUNTER — Emergency Department (HOSPITAL_COMMUNITY): Payer: Medicare Other

## 2014-07-30 ENCOUNTER — Emergency Department (HOSPITAL_COMMUNITY)
Admission: EM | Admit: 2014-07-30 | Discharge: 2014-07-30 | Disposition: A | Discharge status: Home / Self Care | Payer: Medicare Other | Attending: Emergency Medicine | Admitting: Emergency Medicine

## 2014-07-30 DIAGNOSIS — Z992 Dependence on renal dialysis: Secondary | ICD-10-CM | POA: Diagnosis not present

## 2014-07-30 DIAGNOSIS — N186 End stage renal disease: Secondary | ICD-10-CM | POA: Insufficient documentation

## 2014-07-30 DIAGNOSIS — Z79899 Other long term (current) drug therapy: Secondary | ICD-10-CM | POA: Insufficient documentation

## 2014-07-30 DIAGNOSIS — Z87891 Personal history of nicotine dependence: Secondary | ICD-10-CM | POA: Diagnosis not present

## 2014-07-30 DIAGNOSIS — Z7902 Long term (current) use of antithrombotics/antiplatelets: Secondary | ICD-10-CM | POA: Insufficient documentation

## 2014-07-30 DIAGNOSIS — I2510 Atherosclerotic heart disease of native coronary artery without angina pectoris: Secondary | ICD-10-CM | POA: Insufficient documentation

## 2014-07-30 DIAGNOSIS — J111 Influenza due to unidentified influenza virus with other respiratory manifestations: Secondary | ICD-10-CM | POA: Insufficient documentation

## 2014-07-30 DIAGNOSIS — Z7982 Long term (current) use of aspirin: Secondary | ICD-10-CM | POA: Diagnosis not present

## 2014-07-30 DIAGNOSIS — R011 Cardiac murmur, unspecified: Secondary | ICD-10-CM | POA: Insufficient documentation

## 2014-07-30 DIAGNOSIS — K219 Gastro-esophageal reflux disease without esophagitis: Secondary | ICD-10-CM | POA: Insufficient documentation

## 2014-07-30 DIAGNOSIS — Z87442 Personal history of urinary calculi: Secondary | ICD-10-CM | POA: Diagnosis not present

## 2014-07-30 DIAGNOSIS — I120 Hypertensive chronic kidney disease with stage 5 chronic kidney disease or end stage renal disease: Secondary | ICD-10-CM | POA: Insufficient documentation

## 2014-07-30 DIAGNOSIS — Z9889 Other specified postprocedural states: Secondary | ICD-10-CM | POA: Insufficient documentation

## 2014-07-30 DIAGNOSIS — Z85828 Personal history of other malignant neoplasm of skin: Secondary | ICD-10-CM | POA: Insufficient documentation

## 2014-07-30 DIAGNOSIS — R040 Epistaxis: Secondary | ICD-10-CM | POA: Diagnosis present

## 2014-07-30 DIAGNOSIS — E119 Type 2 diabetes mellitus without complications: Secondary | ICD-10-CM | POA: Insufficient documentation

## 2014-07-30 DIAGNOSIS — M1990 Unspecified osteoarthritis, unspecified site: Secondary | ICD-10-CM | POA: Insufficient documentation

## 2014-07-30 LAB — COMPREHENSIVE METABOLIC PANEL
ALT: 28 U/L (ref 0–53)
AST: 22 U/L (ref 0–37)
Albumin: 4.7 g/dL (ref 3.5–5.2)
Alkaline Phosphatase: 82 U/L (ref 39–117)
Anion gap: 8 (ref 5–15)
BUN: 21 mg/dL (ref 6–23)
CO2: 25 mmol/L (ref 19–32)
CREATININE: 1.51 mg/dL — AB (ref 0.50–1.35)
Calcium: 9.4 mg/dL (ref 8.4–10.5)
Chloride: 103 mmol/L (ref 96–112)
GFR calc Af Amer: 52 mL/min — ABNORMAL LOW (ref >90)
GFR calc non Af Amer: 45 mL/min — ABNORMAL LOW (ref >90)
GLUCOSE: 142 mg/dL — AB (ref 70–99)
Potassium: 3.7 mmol/L (ref 3.5–5.1)
SODIUM: 136 mmol/L (ref 135–145)
TOTAL PROTEIN: 7.6 g/dL (ref 6.0–8.3)
Total Bilirubin: 0.9 mg/dL (ref 0.3–1.2)

## 2014-07-30 LAB — URINE MICROSCOPIC-ADD ON

## 2014-07-30 LAB — CBC WITH DIFFERENTIAL/PLATELET
Basophils Absolute: 0 10*3/uL (ref 0.0–0.1)
Basophils Relative: 0 % (ref 0–1)
EOS PCT: 3 % (ref 0–5)
Eosinophils Absolute: 0.2 10*3/uL (ref 0.0–0.7)
HCT: 41.2 % (ref 39.0–52.0)
Hemoglobin: 14.3 g/dL (ref 13.0–17.0)
LYMPHS PCT: 11 % — AB (ref 12–46)
Lymphs Abs: 1 10*3/uL (ref 0.7–4.0)
MCH: 31.6 pg (ref 26.0–34.0)
MCHC: 34.7 g/dL (ref 30.0–36.0)
MCV: 90.9 fL (ref 78.0–100.0)
Monocytes Absolute: 0.8 10*3/uL (ref 0.1–1.0)
Monocytes Relative: 8 % (ref 3–12)
NEUTROS ABS: 7.2 10*3/uL (ref 1.7–7.7)
NEUTROS PCT: 78 % — AB (ref 43–77)
Platelets: 195 10*3/uL (ref 150–400)
RBC: 4.53 MIL/uL (ref 4.22–5.81)
RDW: 12.9 % (ref 11.5–15.5)
WBC: 9.3 10*3/uL (ref 4.0–10.5)

## 2014-07-30 LAB — I-STAT TROPONIN, ED: TROPONIN I, POC: 0 ng/mL (ref 0.00–0.08)

## 2014-07-30 LAB — URINALYSIS, ROUTINE W REFLEX MICROSCOPIC
Bilirubin Urine: NEGATIVE
Glucose, UA: NEGATIVE mg/dL
Hgb urine dipstick: NEGATIVE
Ketones, ur: NEGATIVE mg/dL
Leukocytes, UA: NEGATIVE
NITRITE: NEGATIVE
Protein, ur: 30 mg/dL — AB
Specific Gravity, Urine: 1.012 (ref 1.005–1.030)
Urobilinogen, UA: 0.2 mg/dL (ref 0.0–1.0)
pH: 5 (ref 5.0–8.0)

## 2014-07-30 MED ORDER — SODIUM CHLORIDE 0.9 % IV BOLUS (SEPSIS): 1000.0000 mL | Freq: Once | INTRAVENOUS | Status: DC

## 2014-07-30 MED ORDER — OSELTAMIVIR PHOSPHATE 75 MG PO CAPS
75.0000 mg | ORAL_CAPSULE | Freq: Once | ORAL | Status: AC
  Administered 2014-07-30: 75 mg via ORAL
  Filled 2014-07-30: qty 1

## 2014-07-30 MED ORDER — TRAMADOL HCL 50 MG PO TABS
50.0000 mg | ORAL_TABLET | Freq: Once | ORAL | Status: AC
  Administered 2014-07-30: 50 mg via ORAL
  Filled 2014-07-30: qty 1

## 2014-07-30 MED ORDER — ACETAMINOPHEN 500 MG PO TABS: 1000.0000 mg | ORAL_TABLET | Freq: Four times a day (QID) | ORAL | Status: AC | PRN

## 2014-07-30 MED ORDER — TRAMADOL HCL 50 MG PO TABS: 50.0000 mg | ORAL_TABLET | Freq: Four times a day (QID) | ORAL | Status: DC | PRN

## 2014-07-30 MED ORDER — OSELTAMIVIR PHOSPHATE 75 MG PO CAPS: 75.0000 mg | ORAL_CAPSULE | Freq: Two times a day (BID) | ORAL | Status: DC

## 2014-07-30 NOTE — ED Provider Notes (Signed)
CSN: [id number]     Arrival date & time 07/30/14  1649 History   First MD Initiated Contact with Patient 07/30/14 1911     Chief Complaint  Patient presents with  . Fatigue  . Epistaxis  . Back Pain  . Diarrhea     (Consider location/radiation/quality/duration/timing/severity/associated sxs/prior Treatment) HPI The patient reports he started developing a large amount of nasal congestion yesterday evening. Today he reports that he was feeling very achy all over his body and became fatigued. 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 He notes that that his lower back seemed to be more painful than usual however he does have problems with back pain. He also states he has had diarrhea approximately 3 episodes per day for about the past 2-3 days. When he arrived home this afternoon he felt so weak and fatigued that his legs just felt heavy and he stumbled into his home and had a fall down onto the couch. The patient has had multiple coworkers who have become ill as well. He denies any vomiting. He reports he continues to take in water daily. Past Medical History  Diagnosis Date  . PONV (postoperative nausea and vomiting)   . Diabetes mellitus without complication   . Hypertension   . Dysrhythmia 07-31-13     hx. PAC's   . Coronary artery disease 07-31-13    100 % blockage in left leg artery,affects left arm b/p readings  . Anginal pain 07-31-13    coronary stent x1 last 12'14  . Heart murmur   . Peripheral vascular disease   . GERD (gastroesophageal reflux disease)   . History of kidney stones 07-31-13    x 1  . 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 Bursitis 07-31-13    heel  . Arthritis     osteoarthritis  . Plantar fasciitis of left foot 07-31-13    left leg  . Cancer 07-31-13    skin cancer left cheek   Past Surgical History  Procedure Laterality Date  . Cardiac catheterization      x1 stents 12'14.  . Renal  artery stent Right 07-31-13  . Leg stent Right 07-31-13    right leg  . Femoral-peroneal bypass graft Right   . Shoulder arthroscopy Left     RCTR  . Vasectomy    . Circumcision    . Lumbar laminectomy/decompression microdiscectomy N/A 08/05/2013    Procedure: MICRO LUMBAR DECOMPRESSION L4 - L5;  Surgeon: Johnn Hai, MD;  Location: [facility];  Service: Orthopedics;  Laterality: N/A;   No family history on file. History  Substance Use Topics  . Smoking status: Former Smoker    Types: Cigarettes    Quit date: 08/01/2011  . Smokeless tobacco: Not on file  . Alcohol Use: No    Review of Systems 10 Systems reviewed and are negative for acute change except as noted in the HPI.    Allergies  Contrast media and Quinine derivatives  Home Medications   Prior to Admission medications   Medication Sig Start Date End Date Taking? Authorizing Provider  aspirin EC 81 MG tablet Take 1 tablet (81 mg total) by mouth every morning. 08/06/13  Yes Cecilie Kicks, PA-C  CALCIUM-VITAMIN D PO Take 1 tablet by mouth 2 (two) times daily.   Yes Historical Provider, MD  carvedilol (COREG) 3.125 MG tablet Take 3.125 mg by mouth 2 (two) times  daily with a meal.   Yes Historical Provider, MD  clopidogrel (PLAVIX) 75 MG tablet Take 1 tablet (75 mg total) by mouth daily with breakfast. 08/06/13  Yes Cecilie Kicks, PA-C  glipiZIDE (GLUCOTROL) 5 MG tablet Take 5 mg by mouth daily before breakfast.   Yes Historical Provider, MD  losartan (COZAAR) 100 MG tablet Take 100 mg by mouth daily. 06/07/14  Yes Historical Provider, MD  Omega-3 Fatty Acids (FISH OIL) 1200 MG CPDR Take 1,200 mg by mouth daily.   Yes Historical Provider, MD  pantoprazole (PROTONIX) 40 MG tablet Take 40 mg by mouth daily.  07/19/14  Yes Historical Provider, MD  Vitamin D, Ergocalciferol, (DRISDOL) 50000 UNITS CAPS capsule Take 50,000 Units by mouth every Wednesday.   Yes Historical Provider, MD  atorvastatin (LIPITOR) 20 MG tablet Take 20  mg by mouth at bedtime.    Historical Provider, MD  docusate sodium (COLACE) 100 MG capsule Take 1 capsule (100 mg total) by mouth 2 (two) times daily as needed for mild constipation. Patient not taking: Reported on 07/30/2014 08/05/13   Susa Day, MD  HYDROcodone-acetaminophen (NORCO/VICODIN) 5-325 MG per tablet Take 1-2 tablets by mouth every 4 (four) hours as needed. Patient not taking: Reported on 07/30/2014 08/05/13   Susa Day, MD  oseltamivir (TAMIFLU) 75 MG capsule Take 1 capsule (75 mg total) by mouth every 12 (twelve) hours. 07/30/14   Charlesetta Shanks, MD   BP 140/69 mmHg  Pulse 88  Temp(Src) 99.7 F (37.6 C) (Oral)  Resp 17  SpO2 97% Physical Exam  Constitutional: He is oriented to person, place, and time. He appears well-developed and well-nourished.  HENT:  Head: Normocephalic and atraumatic.  Eyes: EOM are normal. Pupils are equal, round, and reactive to light.  Neck: Neck supple.  Cardiovascular: Normal rate, regular rhythm, normal heart sounds and intact distal pulses.   Pulmonary/Chest: Effort normal and breath sounds normal.  Abdominal: Soft. Bowel sounds are normal. He exhibits no distension. There is no tenderness.  Musculoskeletal: Normal range of motion. He exhibits no edema.  Neurological: He is alert and oriented to person, place, and time. He has normal strength. Coordination normal. GCS eye subscore is 4. GCS verbal subscore is 5. GCS motor subscore is 6.  Skin: Skin is warm, dry and intact.  Psychiatric: He has a normal mood and affect.    ED Course  Procedures (including critical care time) Labs Review Labs Reviewed  CBC WITH DIFFERENTIAL/PLATELET - Abnormal; Notable for the following:    Neutrophils Relative % 78 (*)    Lymphocytes Relative 11 (*)    All other components within normal limits  COMPREHENSIVE METABOLIC PANEL - Abnormal; Notable for the following:    Glucose, Bld 142 (*)    Creatinine, Ser 1.51 (*)    GFR calc non Af Amer 45 (*)     GFR calc Af Amer 52 (*)    All other components within normal limits  URINALYSIS, ROUTINE W REFLEX MICROSCOPIC - Abnormal; Notable for the following:    Protein, ur 30 (*)    All other components within normal limits  URINE MICROSCOPIC-ADD ON  Randolm Idol, ED    Imaging Review Dg Chest 2 View  07/30/2014   CLINICAL DATA:  Fatigue, cough, epistaxis, back pain and diarrhea. Symptoms for 3 days.  EXAM: CHEST  2 VIEW  COMPARISON:  None.  FINDINGS: The cardiomediastinal contours are normal. The lungs are clear. Pulmonary vasculature is normal. No consolidation, pleural effusion, or pneumothorax. No  acute osseous abnormalities are seen. Degenerative change in the thoracic spine. Post left rotator cuff repair.  IMPRESSION: No acute pulmonary process.   Electronically Signed   By: Jeb Levering M.D.   On: 07/30/2014 21:17     EKG Interpretation None      MDM   Final diagnoses:  Influenza   At this time symptoms are most suggestive of an influenza-like illness. The patient developed general myalgia and URI symptoms with fatigue. He felt very weak upon arriving home this afternoon however there was no associated chest pain or dyspnea. The patient will be treated with Tamiflu with a history of comorbid illness of significant cardiovascular disease.    Charlesetta Shanks, MD 07/30/14 [phone]

## 2014-07-30 NOTE — ED Notes (Signed)
Patient upset because they did not have an end time to show when they were discharge. Family also was upset that the physician prescribed tamiflu and did not do a flu swab. Dr. Meliton Rattan went into the room to explain how things are done.

## 2014-07-30 NOTE — Discharge Instructions (Signed)
Suspected Influenza Influenza ("the flu") is a viral infection of the respiratory tract. It occurs more often in winter months because people spend more time in close contact with one another. Influenza can make you feel very sick. Influenza easily spreads from person to person (contagious). CAUSES  Influenza is caused by a virus that infects the respiratory tract. You can catch the virus by breathing in droplets from an infected person's cough or sneeze. You can also catch the virus by touching something that was recently contaminated with the virus and then touching your mouth, nose, or eyes. RISKS AND COMPLICATIONS You may be at risk for a more severe case of influenza if you smoke cigarettes, have diabetes, have chronic heart disease (such as heart failure) or lung disease (such as asthma), or if you have a weakened immune system. Elderly people and pregnant women are also at risk for more serious infections. The most common problem of influenza is a lung infection (pneumonia). Sometimes, this problem can require emergency medical care and may be life threatening. SIGNS AND SYMPTOMS  Symptoms typically last 4 to 10 days and may include:  Fever.  Chills.  Headache, body aches, and muscle aches.  Sore throat.  Chest discomfort and cough.  Poor appetite.  Weakness or feeling tired.  Dizziness.  Nausea or vomiting. DIAGNOSIS  Diagnosis of influenza is often made based on your history and a physical exam. A nose or throat swab test can be done to confirm the diagnosis. TREATMENT  In mild cases, influenza goes away on its own. Treatment is directed at relieving symptoms. For more severe cases, your health care provider may prescribe antiviral medicines to shorten the sickness. Antibiotic medicines are not effective because the infection is caused by a virus, not by bacteria. HOME CARE INSTRUCTIONS  Take medicines only as directed by your health care provider.  Use a cool mist  humidifier to make breathing easier.  Get plenty of rest until your temperature returns to normal. This usually takes 3 to 4 days.  Drink enough fluid to keep your urine clear or pale yellow.  Cover yourmouth and nosewhen coughing or sneezing,and wash your handswellto prevent thevirusfrom spreading.  Stay homefromwork orschool untilthe fever is gonefor at least 2full day. PREVENTION  An annual influenza vaccination (flu shot) is the best way to avoid getting influenza. An annual flu shot is now routinely recommended for all adults in the [place] IF:  You experiencechest pain, yourcough worsens,or you producemore mucus.  Youhave nausea,vomiting, ordiarrhea.  Your fever returns or gets worse. SEEK IMMEDIATE MEDICAL CARE IF:  You havetrouble breathing, you become short of breath,or your skin ornails becomebluish.  You have severe painor stiffnessin the neck.  You develop a sudden headache, or pain in the face or ear.  You have nausea or vomiting that you cannot control. MAKE SURE YOU:   Understand these instructions.  Will watch your condition.  Will get help right away if you are not doing well or get worse. Document Released: 05/04/2000 Document Revised: 09/21/2013 Document Reviewed: 08/06/2011 [facility] Patient Information 2015 [place], [place]. This information is not intended to replace advice given to you by your health care provider. Make sure you discuss any questions you have with your health care provider.

## 2014-07-30 NOTE — ED Notes (Addendum)
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  Pt states that he has had several co-workers that have been sick. Pt states that pt nasal congestion started last night and during the night he used nasal spray.  Wife also states that he ben having problems holding his bowels, pt states that he been having liquid stools.

## 2015-03-16 IMAGING — CR DG CHEST 2V
2 series · 2 of 2 positions shown · non-contrast
Comparison: None.

CLINICAL DATA: Fatigue, cough, epistaxis, back pain and diarrhea.
Symptoms for 3 days.

EXAM:
CHEST  2 VIEW

[w chest lat]
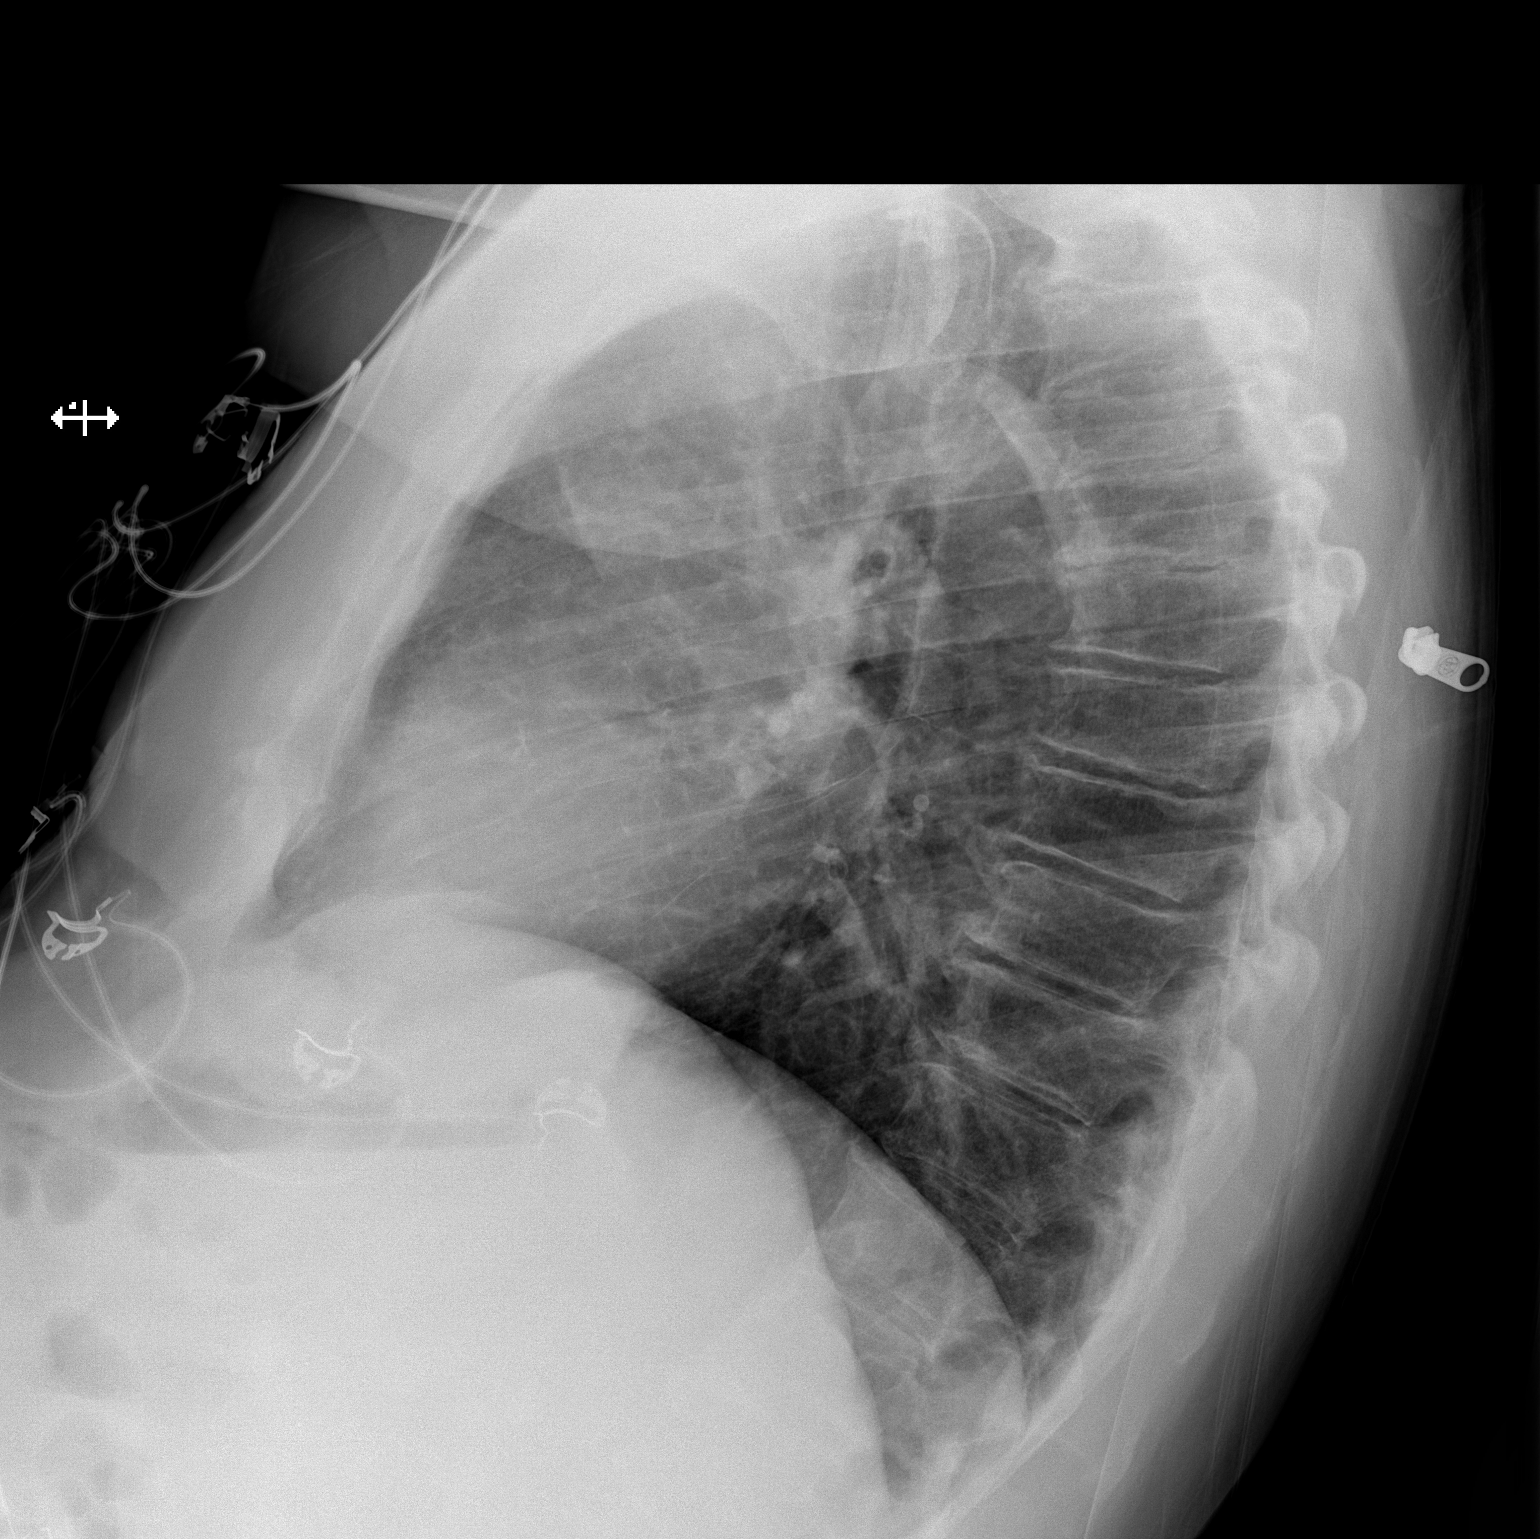

[x chest ap]
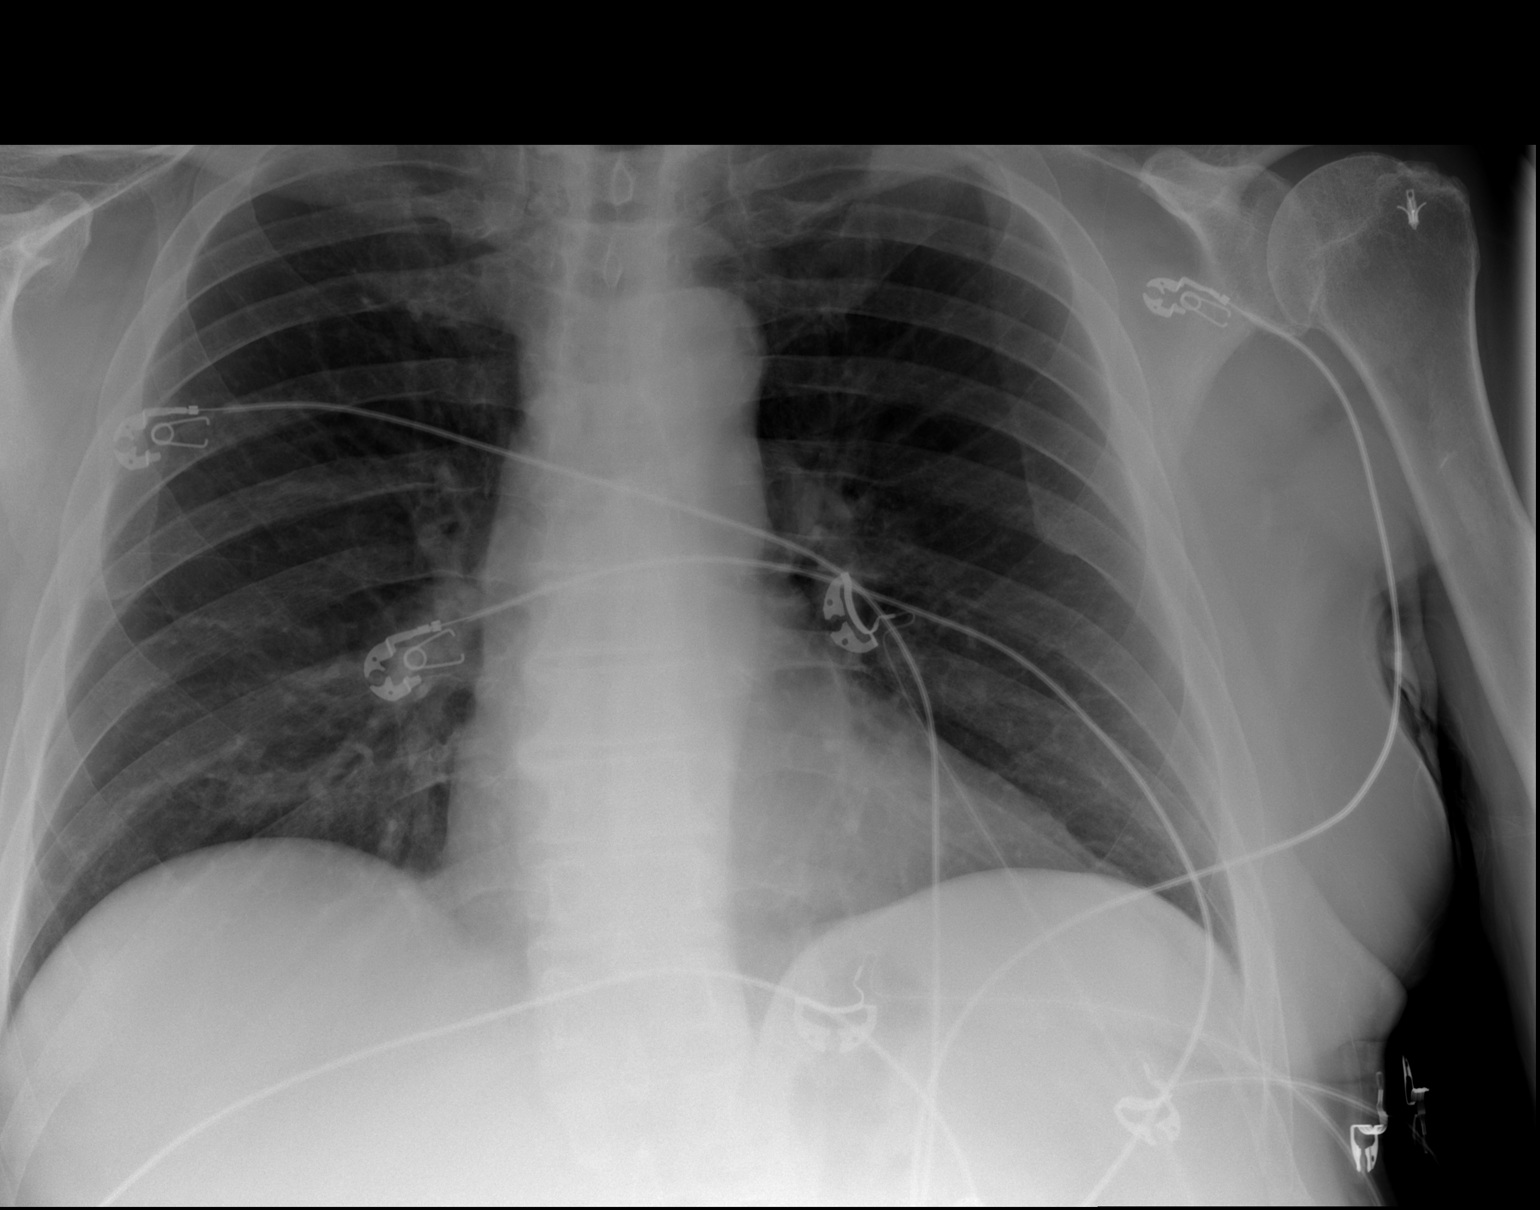

[2 of 2 positions shown; findings below may reference images not displayed]

FINDINGS: The cardiomediastinal contours are normal. The lungs are clear.
Pulmonary vasculature is normal. No consolidation, pleural effusion,
or pneumothorax. No acute osseous abnormalities are seen.
Degenerative change in the thoracic spine. Post left rotator cuff
repair.
IMPRESSION: No acute pulmonary process.

## 2015-07-13 ENCOUNTER — Ambulatory Visit: Payer: Self-pay | Admitting: Orthopedic Surgery

## 2015-07-14 ENCOUNTER — Encounter (HOSPITAL_COMMUNITY)
Admission: RE | Admit: 2015-07-14 | Discharge: 2015-07-14 | Disposition: A | Discharge status: Home / Self Care | Payer: Medicare Other | Source: Ambulatory Visit | Attending: Specialist | Admitting: Specialist

## 2015-07-14 ENCOUNTER — Encounter (HOSPITAL_COMMUNITY): Payer: Self-pay

## 2015-07-14 ENCOUNTER — Ambulatory Visit (HOSPITAL_COMMUNITY)
Admission: RE | Admit: 2015-07-14 | Discharge: 2015-07-14 | Disposition: A | Discharge status: Home / Self Care | Payer: Medicare Other | Source: Ambulatory Visit | Attending: Orthopedic Surgery | Admitting: Orthopedic Surgery

## 2015-07-14 DIAGNOSIS — M48061 Spinal stenosis, lumbar region without neurogenic claudication: Secondary | ICD-10-CM

## 2015-07-14 DIAGNOSIS — M4806 Spinal stenosis, lumbar region: Secondary | ICD-10-CM | POA: Insufficient documentation

## 2015-07-14 DIAGNOSIS — Z01818 Encounter for other preprocedural examination: Secondary | ICD-10-CM | POA: Insufficient documentation

## 2015-07-14 DIAGNOSIS — Z01812 Encounter for preprocedural laboratory examination: Secondary | ICD-10-CM | POA: Diagnosis not present

## 2015-07-14 DIAGNOSIS — K8020 Calculus of gallbladder without cholecystitis without obstruction: Secondary | ICD-10-CM | POA: Diagnosis not present

## 2015-07-14 HISTORY — DX: Rheumatic fever without heart involvement: I00

## 2015-07-14 LAB — BASIC METABOLIC PANEL
ANION GAP: 7 (ref 5–15)
BUN: 24 mg/dL — ABNORMAL HIGH (ref 6–20)
CALCIUM: 9.6 mg/dL (ref 8.9–10.3)
CO2: 26 mmol/L (ref 22–32)
Chloride: 104 mmol/L (ref 101–111)
Creatinine, Ser: 1.83 mg/dL — ABNORMAL HIGH (ref 0.61–1.24)
GFR, EST AFRICAN AMERICAN: 41 mL/min — AB (ref >60)
GFR, EST NON AFRICAN AMERICAN: 35 mL/min — AB (ref >60)
GLUCOSE: 139 mg/dL — AB (ref 65–99)
POTASSIUM: 4.6 mmol/L (ref 3.5–5.1)
SODIUM: 137 mmol/L (ref 135–145)

## 2015-07-14 LAB — SURGICAL PCR SCREEN
MRSA, PCR: NEGATIVE
Staphylococcus aureus: NEGATIVE

## 2015-07-14 NOTE — Progress Notes (Signed)
Surgical clearance - Dr. Ella Jubilee - in chart

## 2015-07-14 NOTE — Progress Notes (Signed)
07-14-15 Gillian Shields, RN left a message with Judeen Hammans at [facility] regarding pt.  LOV with Dr. Ella Jubilee on 07-17-15 stated that pt. Needed to receive surgical clearance by Dr. Maryjean Morn as well.  Waiting to hear from [facility]

## 2015-07-14 NOTE — Progress Notes (Signed)
Received Clearance from Dr Ella Jubilee and placed on chart.  Called office of Dr Maryjean [phone]) and asked if there was a clearance on file from Dr Maryjean Morn for patient to have surgery with Dr Tonita Cong on 07/22/2015.  Office reported no clearance on file from Dr Maryjean Morn.  LOV for patient at office of Dr Maryjean Morn was 4/216 and patient was to follow up in one year per office.

## 2015-07-14 NOTE — Patient Instructions (Addendum)
SAMIE SEVER  07/14/2015   Your procedure is scheduled on: July 22, 2015  Report to [facility] Main  Entrance take [place]  elevators to 3rd floor to  [place] at 7:25 AM.  Call this number if you have problems the morning of surgery [phone]   Remember: ONLY 1 PERSON MAY GO WITH YOU TO SHORT STAY TO GET  READY MORNING OF [place].  Do not eat food or drink liquids :After Midnight.     Take these medicines the morning of surgery with A SIP OF WATER: Carvedilol (Coreg), Protonix DO NOT TAKE ANY DIABETIC MEDICATIONS DAY OF YOUR SURGERY                               You may not have any metal on your body including hair pins and              piercings  Do not wear jewelry, lotions, powders or perfumes, deodorant             Do not shave  48 hours prior to surgery.              Men may shave face and neck.   Do not bring valuables to the hospital. [place].  Contacts, dentures or bridgework may not be worn into surgery.  Leave suitcase in the car. After surgery it may be brought to your room.              Special instructions:  Coughing and deep breathing exercises, leg exercises     Special Instructions: coughing and deep breathing exercises, leg exercises  Incentive Spirometer  An incentive spirometer is a tool that can help keep your lungs clear and active. This tool measures how well you are filling your lungs with each breath. Taking long deep breaths may help reverse or decrease the chance of developing breathing (pulmonary) problems (especially infection) following:  A long period of time when you are unable to move or be active. BEFORE THE PROCEDURE   If the spirometer includes an indicator to show your best effort, your nurse or respiratory therapist will set it to a desired goal.  If possible, sit up straight or lean slightly forward. Try not to slouch.  Hold the incentive  spirometer in an upright position. INSTRUCTIONS FOR USE  1. Sit on the edge of your bed if possible, or sit up as far as you can in bed or on a chair. 2. Hold the incentive spirometer in an upright position. 3. Breathe out normally. 4. Place the mouthpiece in your mouth and seal your lips tightly around it. 5. Breathe in slowly and as deeply as possible, raising the piston or the ball toward the top of the column. 6. Hold your breath for 3-5 seconds or for as long as possible. Allow the piston or ball to fall to the bottom of the column. 7. Remove the mouthpiece from your mouth and breathe out normally. 8. Rest for a few seconds and repeat Steps 1 through 7 at least 10 times every 1-2 hours when you are awake. Take your time and take a few normal breaths between deep breaths. 9. The spirometer may include an indicator to show  your best effort. Use the indicator as a goal to work toward during each repetition. 10. After each set of 10 deep breaths, practice coughing to be sure your lungs are clear. If you have an incision (the cut made at the time of surgery), support your incision when coughing by placing a pillow or rolled up towels firmly against it. Once you are able to get out of bed, walk around indoors and cough well. You may stop using the incentive spirometer when instructed by your caregiver.  RISKS AND COMPLICATIONS  Take your time so you do not get dizzy or light-headed.  If you are in pain, you may need to take or ask for pain medication before doing incentive spirometry. It is harder to take a deep breath if you are having pain. AFTER USE  Rest and breathe slowly and easily.  It can be helpful to keep track of a log of your progress. Your caregiver can provide you with a simple table to help with this. If you are using the spirometer at home, follow these instructions: [place] IF:   You are having difficultly using the spirometer.  You have trouble using the  spirometer as often as instructed.  Your pain medication is not giving enough relief while using the spirometer.  You develop fever of 100.5 F (38.1 C) or higher. SEEK IMMEDIATE MEDICAL CARE IF:   You cough up bloody sputum that had not been present before.  You develop fever of 102 F (38.9 C) or greater.  You develop worsening pain at or near the incision site. MAKE SURE YOU:   Understand these instructions.  Will watch your condition.  Will get help right away if you are not doing well or get worse. Document Released: 09/17/2006 Document Revised: 07/30/2011 Document Reviewed: 11/18/2006 ExitCare Patient Information 2014 [place].   ________________________________________________________________________               Please read over the following fact sheets you were given: _____________________________________________________________________             [facility] - Preparing for Surgery Before surgery, you can play an important role.  Because skin is not sterile, your skin needs to be as free of germs as possible.  You can reduce the number of germs on your skin by washing with CHG (chlorahexidine gluconate) soap before surgery.  CHG is an antiseptic cleaner which kills germs and bonds with the skin to continue killing germs even after washing. Please DO NOT use if you have an allergy to CHG or antibacterial soaps.  If your skin becomes reddened/irritated stop using the CHG and inform your nurse when you arrive at Short Stay. Do not shave (including legs and underarms) for at least 48 hours prior to the first CHG shower.  You may shave your face/neck. Please follow these instructions carefully:  1.  Shower with CHG Soap the night before surgery and the  morning of Surgery.  2.  If you choose to wash your hair, wash your hair first as usual with your  normal  shampoo.  3.  After you shampoo, rinse your hair and body thoroughly to remove the  shampoo.                            4.  Use CHG as you would any other liquid soap.  You can apply chg directly  to the skin and wash  Gently with a scrungie or clean washcloth.  5.  Apply the CHG Soap to your body ONLY FROM THE NECK DOWN.   Do not use on face/ open                           Wound or open sores. Avoid contact with eyes, ears mouth and genitals (private parts).                       Wash face,  Genitals (private parts) with your normal soap.             6.  Wash thoroughly, paying special attention to the area where your surgery  will be performed.  7.  Thoroughly rinse your body with warm water from the neck down.  8.  DO NOT shower/wash with your normal soap after using and rinsing off  the CHG Soap.                9.  Pat yourself dry with a clean towel.            10.  Wear clean pajamas.            11.  Place clean sheets on your bed the night of your first shower and do not  sleep with pets. Day of Surgery : Do not apply any lotions/deodorants the morning of surgery.  Please wear clean clothes to the hospital/surgery center.  FAILURE TO FOLLOW THESE INSTRUCTIONS MAY RESULT IN THE CANCELLATION OF YOUR SURGERY PATIENT SIGNATURE_________________________________  NURSE SIGNATURE__________________________________  ________________________________________________________________________

## 2015-07-14 NOTE — Progress Notes (Addendum)
Spoke with [place])  at office of Dr Tonita Cong and she stated the clearance from Dr Ella Jubilee is the only clearance she has on the patient .  I made her aware that Dr Solmon Ice note of 07/07/2015 reads" patient is to receive clearance by Dr Maryjean Morn as well.  ".  Judeen Hammans also aware I called office of Dr Maryjean Morn and there is no clearance at Dr Maryjean Morn office and patient's LOV was 08/2014 there.   Judeen Hammans Stated that Dr Tonita Cong gives to her the clearances he has received prior to her scheduling the patient.  She will check with Dr Tonita Cong to see if clearance is needed by Dr Maryjean Morn for surgery and get back with [place] if needed, otherwise Dr Ella Jubilee clearance will be the only clearance on the chart.

## 2015-07-14 NOTE — Progress Notes (Signed)
07-13-15 - BMP lab results from preop visit on 07-13-15 faxed to Dr. Tonita Cong via [facility]

## 2015-07-14 NOTE — Progress Notes (Signed)
07-07-15 - LOV - Dr. Ella Jubilee (int.med.) [place]  07-07-15 - EKG - in charat 07-07-15 CBC - in chart 07-06-15 - LOV - Dr. Annia Belt (neph) - [place]  07-31-14 - EKG - EPIC 07-30-14 - 2V CXR - EPIC

## 2015-07-14 NOTE — Progress Notes (Signed)
07-14-15 - Talked to [facility] at [facility] medicine.  Asked her to fax the paper tracing EKG on [facility] [place] from [place] on 07-10-15 with Dr. Ella Jubilee.  Fax to [phone].

## 2015-07-15 LAB — HEMOGLOBIN A1C
Hgb A1c MFr Bld: 6.3 % — ABNORMAL HIGH (ref 4.8–5.6)
Mean Plasma Glucose: 134 mg/dL

## 2015-07-19 ENCOUNTER — Ambulatory Visit: Payer: Self-pay | Admitting: Orthopedic Surgery

## 2015-07-19 NOTE — H&P (Signed)
[name] is an 70 y.o. male.   Chief Complaint: back and B/L leg pain, weakness HPI: The patient is a 70 year old male who presents today for follow up of their back. The patient is being followed for their low back pain. They are now 9 month(s) (3 weeks) out from when symptoms began. Symptoms reported today include: pain, leg pain (bilateral), foot pain and pain with standing (walking). The patient states that they are doing poorly. Current treatment includes: bracing, relative rest, activity modification and pain medications. The following medication has been used for pain control: none. The patient reports their current pain level to be moderate to severe.  [name] follows up and reports persistent pain in the buttock, thigh and also flapping of the left leg when he attempts to ambulate.   He has reported this footdrop for three months. He reports that it is getting worse.  Past Medical History  Diagnosis Date  . PONV (postoperative nausea and vomiting)   . Diabetes mellitus without complication ([place])   . Hypertension   . Dysrhythmia 07-31-13     hx. PAC's   . Coronary artery disease 07-31-13    100 % blockage in left leg artery,affects left arm b/p readings  . Anginal pain ([place]) 07-31-13    coronary stent x1 last 12'14  . Heart murmur   . Peripheral vascular disease ([place])   . GERD (gastroesophageal reflux disease)   . History of kidney stones 07-31-13    x 1  . 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 Bursitis 07-31-13    heel  . Arthritis     osteoarthritis  . Plantar fasciitis of left foot 07-31-13    left leg  . Cancer ([place]) 07-31-13    skin cancer left cheek  . Rheumatic fever     when 70 years old    Past Surgical History  Procedure Laterality Date  . Cardiac catheterization      x1 stents 12'14.  . Renal artery stent Right 07-31-13  . Leg stent Right 07-31-13    right leg  . Femoral-peroneal bypass graft Right   .  Shoulder arthroscopy Left     RCTR  . Vasectomy    . Circumcision    . Lumbar laminectomy/decompression microdiscectomy N/A 08/05/2013    Procedure: MICRO LUMBAR DECOMPRESSION L4 - L5;  Surgeon: Johnn Hai, MD;  Location: [facility];  Service: Orthopedics;  Laterality: N/A;  . Stent in right kidney      No family history on file. Social History:  reports that he quit smoking about 3 years ago. His smoking use included Cigarettes. He has never used smokeless tobacco. He reports that he does not drink alcohol or use illicit drugs.  Allergies:  Allergies  Allergen Reactions  . Contrast Media [Iodinated Diagnostic Agents] Nausea And Vomiting  . Quinine Derivatives Other (See Comments)    Pt does not know      (Not in a hospital admission)  No results found for this or any previous visit (from the past 48 hour(s)). No results found.  Review of Systems  Constitutional: Negative.   HENT: Negative.   Eyes: Negative.   Respiratory: Negative.   Cardiovascular: Negative.   Gastrointestinal: Negative.   Genitourinary: Negative.   Musculoskeletal: Positive for back pain.  Skin: Negative.   Neurological: Positive for sensory change and focal weakness.  Psychiatric/Behavioral: Negative.     There  were no vitals taken for this visit. Physical Exam  Constitutional: He is oriented to person, place, and time. He appears well-developed.  HENT:  Head: Normocephalic.  Eyes: Pupils are equal, round, and reactive to light.  Neck: Normal range of motion.  Cardiovascular: Normal rate.   Respiratory: Effort normal.  GI: Soft.  Musculoskeletal:  On exam, he is upright. No distress. Mood and affect is appropriate. Straight leg raises, low back pain and buttock pain. He has 4+/5 dorsiflexion EHL on the left compared to the right, 1 to 2+ posterior tibial pulse, 1+ dorsalis pedis on the right, unable to palpate pulses on the left. Although he has capillary refill in the foot, the extremity is  viable.  Lumbar spine exam reveals no evidence of soft tissue swelling, deformity or skin ecchymosis. On palpation there is no tenderness of the lumbar spine. No flank pain with percussion. The abdomen is soft and nontender. Nontender over the trochanters. No cellulitis or lymphadenopathy.  Motor is 5/5 including tibialis anterior, plantar flexion, quadriceps and hamstrings. Patient is normoreflexic. There is no Babinski or clonus. Sensory exam is intact to light touch. Patient has good distal pulses. No DVT. No pain and normal range of motion without instability of the hips, knees and ankles.   Neurological: He is alert and oriented to person, place, and time.  Skin: Skin is warm.    His doppler indicates ABI of 0.77 for the lower extremity, consistent with mild occlusive disease at rest and the patient has a known left superficial femoral artery occlusion. There is no change in his studies since 08/30/2014. The ABI on his right was 1.1. He has quit smoking. He has a right SFA stent. He reports they were unable to stent the left, but he had collateral circulation.  His MRI does demonstrate progressive lateral constriction of the thecal sac with displacement of the left L4 nerve root. There is severe foraminal stenosis at 3-4. At 4-5, there are signs of decompression with foraminal stenosis. There is some lateral recess stenosis with effacement of the left L5 nerve root. There is osteoarthritis of the sacroiliac joint noted as well.  Assessment/Plan 1. L4 radiculopathy, monotomal weakness, dermatomal dysesthesias due to progressive collapse of the lateral recess. 2. Disc protrusion, lateral recess stenosis with vascular claudication with ABI of 0.77, no change since April 16. 3. History of tobacco use. Currently on chronic Plavix.  I had an extensive discussion concerning his current pathology, relevant anatomy and treatment options at this point in time. He does also have a history of gout. He has  more in terms of the tibialis anterior was L5 before. He does have problems going up and downstairs, but more when he is standing up walking and dragging his foot. Two options at this point in time, as he reports that it has been progressive: One would be to live with his symptoms and utilize a brace high top boot versus decompression at 3-4 and then possible revision decompression at 4-5. I had an extensive discussion of the risks and benefits of the lumbar decompression with the patient including bleeding, infection, damage to neurovascular structures, epidural fibrosis, CSF leak requiring repair. We also discussed increase in pain, adjacent segment disease, recurrent disc herniation, need for future surgery including repeat decompression and/or fusion. We also discussed risks of postoperative hematoma, paralysis, anesthetic complications including DVT, PE, death, cardiopulmonary dysfunction. In addition, the perioperative and postoperative courses were discussed in detail including the rehabilitative time and return to functional activity  and work. I provided the patient with an illustrated handout and utilized the appropriate surgical models. There is, however, no guarantee that his footdrop would improve following this. However, certainly, it has not improved and he has persistent buttock and thigh pain with it. He would have to come off his Plavix for the surgical intervention and then proceed accordingly. In the interim, I have instructed him to wear a high top boot, avoid extension, favor flexion. I do not feel an injection would be therapeutic in the presence of a footdrop. I will obtain preoperative clearance. If it is worsening in the interim, he is to call.  Plan redo decompression L3-4, L4-5  Letrell Attwood, Conley Rolls., PA-C for Dr. Tonita Cong 07/19/2015, 12:22 PM

## 2015-07-20 NOTE — Progress Notes (Signed)
Called and left Orson Slick a voice mail message looking for clearance on patient from Dr Maryjean Morn.  Left call back phone number of [phone].

## 2015-07-22 ENCOUNTER — Ambulatory Visit (HOSPITAL_COMMUNITY): Payer: Medicare Other

## 2015-07-22 ENCOUNTER — Encounter (HOSPITAL_COMMUNITY): Payer: Self-pay | Admitting: *Deleted

## 2015-07-22 ENCOUNTER — Ambulatory Visit (HOSPITAL_COMMUNITY): Payer: Medicare Other | Admitting: Anesthesiology

## 2015-07-22 ENCOUNTER — Encounter (HOSPITAL_COMMUNITY)
Admission: RE | Disposition: A | Discharge status: Home / Self Care | Payer: Self-pay | Source: Ambulatory Visit | Attending: Specialist

## 2015-07-22 ENCOUNTER — Ambulatory Visit (HOSPITAL_COMMUNITY)
Admission: RE | Admit: 2015-07-22 | Discharge: 2015-07-23 | Disposition: A | Discharge status: Home / Self Care | Payer: Medicare Other | Source: Ambulatory Visit | Attending: Specialist | Admitting: Specialist

## 2015-07-22 DIAGNOSIS — M5116 Intervertebral disc disorders with radiculopathy, lumbar region: Secondary | ICD-10-CM | POA: Insufficient documentation

## 2015-07-22 DIAGNOSIS — M48061 Spinal stenosis, lumbar region without neurogenic claudication: Secondary | ICD-10-CM | POA: Diagnosis present

## 2015-07-22 DIAGNOSIS — I120 Hypertensive chronic kidney disease with stage 5 chronic kidney disease or end stage renal disease: Secondary | ICD-10-CM | POA: Insufficient documentation

## 2015-07-22 DIAGNOSIS — M7138 Other bursal cyst, other site: Secondary | ICD-10-CM | POA: Insufficient documentation

## 2015-07-22 DIAGNOSIS — Z87891 Personal history of nicotine dependence: Secondary | ICD-10-CM | POA: Insufficient documentation

## 2015-07-22 DIAGNOSIS — I2510 Atherosclerotic heart disease of native coronary artery without angina pectoris: Secondary | ICD-10-CM | POA: Diagnosis not present

## 2015-07-22 DIAGNOSIS — M109 Gout, unspecified: Secondary | ICD-10-CM | POA: Insufficient documentation

## 2015-07-22 DIAGNOSIS — Z85828 Personal history of other malignant neoplasm of skin: Secondary | ICD-10-CM | POA: Insufficient documentation

## 2015-07-22 DIAGNOSIS — M1990 Unspecified osteoarthritis, unspecified site: Secondary | ICD-10-CM | POA: Insufficient documentation

## 2015-07-22 DIAGNOSIS — I739 Peripheral vascular disease, unspecified: Secondary | ICD-10-CM | POA: Insufficient documentation

## 2015-07-22 DIAGNOSIS — Z7902 Long term (current) use of antithrombotics/antiplatelets: Secondary | ICD-10-CM | POA: Insufficient documentation

## 2015-07-22 DIAGNOSIS — E119 Type 2 diabetes mellitus without complications: Secondary | ICD-10-CM | POA: Insufficient documentation

## 2015-07-22 DIAGNOSIS — Z955 Presence of coronary angioplasty implant and graft: Secondary | ICD-10-CM | POA: Diagnosis not present

## 2015-07-22 DIAGNOSIS — M4806 Spinal stenosis, lumbar region: Secondary | ICD-10-CM | POA: Diagnosis present

## 2015-07-22 DIAGNOSIS — K219 Gastro-esophageal reflux disease without esophagitis: Secondary | ICD-10-CM | POA: Diagnosis not present

## 2015-07-22 DIAGNOSIS — Z419 Encounter for procedure for purposes other than remedying health state, unspecified: Secondary | ICD-10-CM

## 2015-07-22 HISTORY — PX: LUMBAR LAMINECTOMY/DECOMPRESSION MICRODISCECTOMY: SHX5026

## 2015-07-22 LAB — GLUCOSE, CAPILLARY
GLUCOSE-CAPILLARY: 140 mg/dL — AB (ref 65–99)
GLUCOSE-CAPILLARY: 245 mg/dL — AB (ref 65–99)
Glucose-Capillary: 138 mg/dL — ABNORMAL HIGH (ref 65–99)
Glucose-Capillary: 154 mg/dL — ABNORMAL HIGH (ref 65–99)
Glucose-Capillary: 194 mg/dL — ABNORMAL HIGH (ref 65–99)

## 2015-07-22 SURGERY — LUMBAR LAMINECTOMY/DECOMPRESSION MICRODISCECTOMY 2 LEVELS
Anesthesia: General

## 2015-07-22 MED ORDER — POLYVINYL ALCOHOL 1.4 % OP SOLN
1.0000 [drp] | Freq: Two times a day (BID) | OPHTHALMIC | Status: DC | PRN
  Filled 2015-07-22: qty 15

## 2015-07-22 MED ORDER — THROMBIN 5000 UNITS EX SOLR
CUTANEOUS | Status: AC
  Filled 2015-07-22: qty 10000

## 2015-07-22 MED ORDER — DEXAMETHASONE SODIUM PHOSPHATE 10 MG/ML IJ SOLN
INTRAMUSCULAR | Status: DC | PRN
  Administered 2015-07-22: 10 mg via INTRAVENOUS

## 2015-07-22 MED ORDER — SODIUM CHLORIDE 0.9 % IR SOLN
Status: DC | PRN
  Administered 2015-07-22: 500 mL

## 2015-07-22 MED ORDER — LABETALOL HCL 5 MG/ML IV SOLN
INTRAVENOUS | Status: DC | PRN
  Administered 2015-07-22: 5 mg via INTRAVENOUS

## 2015-07-22 MED ORDER — HYDROCODONE-ACETAMINOPHEN 5-325 MG PO TABS: 1.0000 | ORAL_TABLET | ORAL | Status: DC | PRN

## 2015-07-22 MED ORDER — DOCUSATE SODIUM 100 MG PO CAPS: 100.0000 mg | ORAL_CAPSULE | Freq: Two times a day (BID) | ORAL | Status: DC | PRN

## 2015-07-22 MED ORDER — CEFAZOLIN SODIUM-DEXTROSE 2-3 GM-% IV SOLR
INTRAVENOUS | Status: AC
  Filled 2015-07-22: qty 50

## 2015-07-22 MED ORDER — DEXAMETHASONE SODIUM PHOSPHATE 10 MG/ML IJ SOLN
INTRAMUSCULAR | Status: AC
  Filled 2015-07-22: qty 1

## 2015-07-22 MED ORDER — HYDROMORPHONE HCL 1 MG/ML IJ SOLN: 0.5000 mg | INTRAMUSCULAR | Status: DC | PRN

## 2015-07-22 MED ORDER — ROCURONIUM BROMIDE 100 MG/10ML IV SOLN
INTRAVENOUS | Status: DC | PRN
  Administered 2015-07-22: 50 mg via INTRAVENOUS

## 2015-07-22 MED ORDER — PHENOL 1.4 % MT LIQD: 1.0000 | OROMUCOSAL | Status: DC | PRN

## 2015-07-22 MED ORDER — TRAMADOL HCL 50 MG PO TABS: 50.0000 mg | ORAL_TABLET | Freq: Four times a day (QID) | ORAL | Status: DC | PRN

## 2015-07-22 MED ORDER — SENNOSIDES-DOCUSATE SODIUM 8.6-50 MG PO TABS: 1.0000 | ORAL_TABLET | Freq: Every evening | ORAL | Status: DC | PRN

## 2015-07-22 MED ORDER — HYDROMORPHONE HCL 1 MG/ML IJ SOLN: 0.2500 mg | INTRAMUSCULAR | Status: DC | PRN

## 2015-07-22 MED ORDER — INSULIN ASPART 100 UNIT/ML ~~LOC~~ SOLN
0.0000 [IU] | Freq: Three times a day (TID) | SUBCUTANEOUS | Status: DC
  Administered 2015-07-22: 3 [IU] via SUBCUTANEOUS
  Administered 2015-07-23: 2 [IU] via SUBCUTANEOUS

## 2015-07-22 MED ORDER — MENTHOL 3 MG MT LOZG
1.0000 | LOZENGE | OROMUCOSAL | Status: DC | PRN
  Administered 2015-07-23: 3 mg via ORAL
  Filled 2015-07-22: qty 9

## 2015-07-22 MED ORDER — BUPIVACAINE-EPINEPHRINE (PF) 0.5% -1:200000 IJ SOLN
INTRAMUSCULAR | Status: AC
  Filled 2015-07-22: qty 30

## 2015-07-22 MED ORDER — METHOCARBAMOL 1000 MG/10ML IJ SOLN
500.0000 mg | Freq: Four times a day (QID) | INTRAMUSCULAR | Status: DC | PRN
  Administered 2015-07-22: 500 mg via INTRAVENOUS
  Filled 2015-07-22 (×2): qty 5

## 2015-07-22 MED ORDER — SODIUM CHLORIDE 0.9 % IJ SOLN
INTRAMUSCULAR | Status: AC
  Filled 2015-07-22: qty 10

## 2015-07-22 MED ORDER — BISACODYL 5 MG PO TBEC: 5.0000 mg | DELAYED_RELEASE_TABLET | Freq: Every day | ORAL | Status: DC | PRN

## 2015-07-22 MED ORDER — CEFAZOLIN SODIUM-DEXTROSE 2-3 GM-% IV SOLR
2.0000 g | INTRAVENOUS | Status: AC
  Administered 2015-07-22: 2 g via INTRAVENOUS

## 2015-07-22 MED ORDER — SODIUM CHLORIDE 0.9 % IR SOLN
Status: AC
  Filled 2015-07-22: qty 1

## 2015-07-22 MED ORDER — OXYCODONE HCL 5 MG/5ML PO SOLN
5.0000 mg | Freq: Once | ORAL | Status: DC | PRN
  Filled 2015-07-22: qty 5

## 2015-07-22 MED ORDER — CARVEDILOL 3.125 MG PO TABS
3.1250 mg | ORAL_TABLET | Freq: Two times a day (BID) | ORAL | Status: DC
  Administered 2015-07-22 – 2015-07-23 (×2): 3.125 mg via ORAL
  Filled 2015-07-22 (×4): qty 1

## 2015-07-22 MED ORDER — PANTOPRAZOLE SODIUM 40 MG PO TBEC
40.0000 mg | DELAYED_RELEASE_TABLET | Freq: Every day | ORAL | Status: DC
Start: 2015-07-22 — End: 2015-07-23
  Administered 2015-07-22 – 2015-07-23 (×2): 40 mg via ORAL
  Filled 2015-07-22 (×2): qty 1

## 2015-07-22 MED ORDER — BUPIVACAINE-EPINEPHRINE (PF) 0.5% -1:200000 IJ SOLN
INTRAMUSCULAR | Status: DC | PRN
  Administered 2015-07-22: 15 mL

## 2015-07-22 MED ORDER — LABETALOL HCL 5 MG/ML IV SOLN
INTRAVENOUS | Status: AC
  Filled 2015-07-22: qty 4

## 2015-07-22 MED ORDER — FENTANYL CITRATE (PF) 250 MCG/5ML IJ SOLN
INTRAMUSCULAR | Status: AC
  Filled 2015-07-22: qty 5

## 2015-07-22 MED ORDER — EPHEDRINE SULFATE 50 MG/ML IJ SOLN
INTRAMUSCULAR | Status: AC
  Filled 2015-07-22: qty 1

## 2015-07-22 MED ORDER — ACETAMINOPHEN 325 MG PO TABS: 650.0000 mg | ORAL_TABLET | ORAL | Status: DC | PRN

## 2015-07-22 MED ORDER — MEPERIDINE HCL 50 MG/ML IJ SOLN: 6.2500 mg | INTRAMUSCULAR | Status: DC | PRN

## 2015-07-22 MED ORDER — EPHEDRINE SULFATE 50 MG/ML IJ SOLN
INTRAMUSCULAR | Status: DC | PRN
  Administered 2015-07-22 (×3): 5 mg via INTRAVENOUS

## 2015-07-22 MED ORDER — DOCUSATE SODIUM 100 MG PO CAPS
100.0000 mg | ORAL_CAPSULE | Freq: Two times a day (BID) | ORAL | Status: DC
  Administered 2015-07-22 – 2015-07-23 (×2): 100 mg via ORAL

## 2015-07-22 MED ORDER — ALUM & MAG HYDROXIDE-SIMETH 200-200-20 MG/5ML PO SUSP: 30.0000 mL | Freq: Four times a day (QID) | ORAL | Status: DC | PRN

## 2015-07-22 MED ORDER — ACETAMINOPHEN 650 MG RE SUPP: 650.0000 mg | RECTAL | Status: DC | PRN

## 2015-07-22 MED ORDER — OXYCODONE-ACETAMINOPHEN 5-325 MG PO TABS: 1.0000 | ORAL_TABLET | ORAL | Status: DC | PRN

## 2015-07-22 MED ORDER — PROPOFOL 10 MG/ML IV BOLUS
INTRAVENOUS | Status: AC
  Filled 2015-07-22: qty 20

## 2015-07-22 MED ORDER — PROPOFOL 10 MG/ML IV BOLUS
INTRAVENOUS | Status: DC | PRN
  Administered 2015-07-22: 150 mg via INTRAVENOUS

## 2015-07-22 MED ORDER — MAGNESIUM CITRATE PO SOLN: 1.0000 | Freq: Once | ORAL | Status: DC | PRN

## 2015-07-22 MED ORDER — THROMBIN 5000 UNITS EX SOLR
CUTANEOUS | Status: DC | PRN
  Administered 2015-07-22: 10000 [IU] via TOPICAL

## 2015-07-22 MED ORDER — LOSARTAN POTASSIUM 50 MG PO TABS
100.0000 mg | ORAL_TABLET | Freq: Every day | ORAL | Status: DC
  Administered 2015-07-22 – 2015-07-23 (×2): 100 mg via ORAL
  Filled 2015-07-22 (×2): qty 2

## 2015-07-22 MED ORDER — POTASSIUM CHLORIDE IN NACL 20-0.9 MEQ/L-% IV SOLN
INTRAVENOUS | Status: DC
  Administered 2015-07-22: 19:00:00 via INTRAVENOUS
  Filled 2015-07-22 (×2): qty 1000

## 2015-07-22 MED ORDER — ROCURONIUM BROMIDE 100 MG/10ML IV SOLN
INTRAVENOUS | Status: AC
  Filled 2015-07-22: qty 1

## 2015-07-22 MED ORDER — FENTANYL CITRATE (PF) 100 MCG/2ML IJ SOLN
INTRAMUSCULAR | Status: DC | PRN
  Administered 2015-07-22: 50 ug via INTRAVENOUS
  Administered 2015-07-22: 100 ug via INTRAVENOUS
  Administered 2015-07-22 (×2): 50 ug via INTRAVENOUS

## 2015-07-22 MED ORDER — ONDANSETRON HCL 4 MG/2ML IJ SOLN: 4.0000 mg | INTRAMUSCULAR | Status: DC | PRN

## 2015-07-22 MED ORDER — SUGAMMADEX SODIUM 200 MG/2ML IV SOLN
INTRAVENOUS | Status: AC
  Filled 2015-07-22: qty 2

## 2015-07-22 MED ORDER — CEFAZOLIN SODIUM-DEXTROSE 2-3 GM-% IV SOLR
2.0000 g | Freq: Three times a day (TID) | INTRAVENOUS | Status: DC
  Administered 2015-07-22 – 2015-07-23 (×2): 2 g via INTRAVENOUS
  Filled 2015-07-22 (×3): qty 50

## 2015-07-22 MED ORDER — NITROGLYCERIN 0.4 MG SL SUBL: 0.4000 mg | SUBLINGUAL_TABLET | SUBLINGUAL | Status: DC | PRN

## 2015-07-22 MED ORDER — OXYCODONE HCL 5 MG PO TABS: 5.0000 mg | ORAL_TABLET | Freq: Once | ORAL | Status: DC | PRN

## 2015-07-22 MED ORDER — RISAQUAD PO CAPS
1.0000 | ORAL_CAPSULE | Freq: Every day | ORAL | Status: DC
  Administered 2015-07-22 – 2015-07-23 (×2): 1 via ORAL
  Filled 2015-07-22 (×2): qty 1

## 2015-07-22 MED ORDER — LACTATED RINGERS IV SOLN
INTRAVENOUS | Status: DC
  Administered 2015-07-22 (×2): via INTRAVENOUS

## 2015-07-22 MED ORDER — LIDOCAINE HCL (CARDIAC) 20 MG/ML IV SOLN
INTRAVENOUS | Status: DC | PRN
  Administered 2015-07-22: 50 mg via INTRAVENOUS

## 2015-07-22 MED ORDER — LIDOCAINE HCL (CARDIAC) 20 MG/ML IV SOLN
INTRAVENOUS | Status: AC
  Filled 2015-07-22: qty 5

## 2015-07-22 MED ORDER — ONDANSETRON HCL 4 MG/2ML IJ SOLN
INTRAMUSCULAR | Status: AC
  Filled 2015-07-22: qty 2

## 2015-07-22 MED ORDER — SUGAMMADEX SODIUM 200 MG/2ML IV SOLN
INTRAVENOUS | Status: DC | PRN
  Administered 2015-07-22: 170 mg via INTRAVENOUS

## 2015-07-22 MED ORDER — METHOCARBAMOL 500 MG PO TABS: 500.0000 mg | ORAL_TABLET | Freq: Four times a day (QID) | ORAL | Status: DC | PRN

## 2015-07-22 SURGICAL SUPPLY — 50 items
BAG SPEC THK2 15X12 ZIP CLS (MISCELLANEOUS)
BAG ZIPLOCK 12X15 (MISCELLANEOUS) IMPLANT
CLEANER TIP ELECTROSURG 2X2 (MISCELLANEOUS) ×3 IMPLANT
CLOSURE WOUND 1/2 X4 (GAUZE/BANDAGES/DRESSINGS) ×1
CLOTH 2% CHLOROHEXIDINE 3PK (PERSONAL CARE ITEMS) ×3 IMPLANT
DRAPE MICROSCOPE LEICA (MISCELLANEOUS) ×3 IMPLANT
DRAPE SHEET LG 3/4 BI-LAMINATE (DRAPES) ×2 IMPLANT
DRAPE SURG 17X11 SM STRL (DRAPES) ×3 IMPLANT
DRAPE UTILITY XL STRL (DRAPES) ×3 IMPLANT
DRSG AQUACEL AG ADV 3.5X 4 (GAUZE/BANDAGES/DRESSINGS) IMPLANT
DRSG AQUACEL AG ADV 3.5X 6 (GAUZE/BANDAGES/DRESSINGS) IMPLANT
DURAPREP 26ML APPLICATOR (WOUND CARE) ×3 IMPLANT
DURASEAL SPINE SEALANT 3ML (MISCELLANEOUS) IMPLANT
ELECT BLADE TIP CTD 4 INCH (ELECTRODE) ×2 IMPLANT
ELECT REM PT RETURN 9FT ADLT (ELECTROSURGICAL) ×3
ELECTRODE REM PT RTRN 9FT ADLT (ELECTROSURGICAL) ×1 IMPLANT
GLOVE BIOGEL PI IND STRL 7.0 (GLOVE) ×1 IMPLANT
GLOVE BIOGEL PI INDICATOR 7.0 (GLOVE) ×6
GLOVE SURG SS PI 7.0 STRL IVOR (GLOVE) ×3 IMPLANT
GLOVE SURG SS PI 7.5 STRL IVOR (GLOVE) ×3 IMPLANT
GLOVE SURG SS PI 8.0 STRL IVOR (GLOVE) ×6 IMPLANT
GOWN STRL REUS W/TWL XL LVL3 (GOWN DISPOSABLE) ×8 IMPLANT
IV CATH 14GX2 1/4 (CATHETERS) ×2 IMPLANT
KIT BASIN OR (CUSTOM PROCEDURE TRAY) ×3 IMPLANT
KIT POSITIONING SURG ANDREWS (MISCELLANEOUS) ×3 IMPLANT
MANIFOLD NEPTUNE II (INSTRUMENTS) ×3 IMPLANT
MARKER SKIN DUAL TIP RULER LAB (MISCELLANEOUS) ×3 IMPLANT
NDL SPNL 18GX3.5 QUINCKE PK (NEEDLE) ×2 IMPLANT
NEEDLE SPNL 18GX3.5 QUINCKE PK (NEEDLE) ×6 IMPLANT
PACK LAMINECTOMY ORTHO (CUSTOM PROCEDURE TRAY) ×3 IMPLANT
PATTIES SURGICAL .5 X.5 (GAUZE/BANDAGES/DRESSINGS) ×1 IMPLANT
PATTIES SURGICAL .75X.75 (GAUZE/BANDAGES/DRESSINGS) ×3 IMPLANT
PATTIES SURGICAL 1X1 (DISPOSABLE) IMPLANT
RUBBERBAND STERILE (MISCELLANEOUS) ×3 IMPLANT
SPONGE LAP 4X18 X RAY DECT (DISPOSABLE) ×6 IMPLANT
SPONGE SURGIFOAM ABS GEL 100 (HEMOSTASIS) ×3 IMPLANT
STAPLER VISISTAT (STAPLE) IMPLANT
STRIP CLOSURE SKIN 1/2X4 (GAUZE/BANDAGES/DRESSINGS) ×1 IMPLANT
SUT NURALON 4 0 TR CR/8 (SUTURE) IMPLANT
SUT PROLENE 3 0 PS 2 (SUTURE) ×2 IMPLANT
SUT VIC AB 1 CT1 27 (SUTURE) ×6
SUT VIC AB 1 CT1 27XBRD ANTBC (SUTURE) IMPLANT
SUT VIC AB 1-0 CT2 27 (SUTURE) ×2 IMPLANT
SUT VIC AB 2-0 CT1 27 (SUTURE) ×3
SUT VIC AB 2-0 CT1 TAPERPNT 27 (SUTURE) IMPLANT
SUT VIC AB 2-0 CT2 27 (SUTURE) IMPLANT
SYR 3ML LL SCALE MARK (SYRINGE) ×2 IMPLANT
TOWEL OR 17X26 10 PK STRL BLUE (TOWEL DISPOSABLE) ×3 IMPLANT
TOWEL OR NON WOVEN STRL DISP B (DISPOSABLE) ×3 IMPLANT
YANKAUER SUCT BULB TIP NO VENT (SUCTIONS) ×3 IMPLANT

## 2015-07-22 NOTE — Discharge Instructions (Signed)
Walk As Tolerated utilizing back precautions.  No bending, twisting, or lifting.  No driving for 2 weeks.   Aquacel dressing may remain in place until follow up. May shower with aquacel dressing in place. If the dressing peels off or becomes saturated, you may remove aquacel dressing and place gauze and tape dressing which should be kept clean and dry and changed daily. Do not remove steri-strips if they are present. See Dr. Tonita Cong in office in 10 to 14 days. Begin taking aspirin 81mg  per day and your plavix  starting 4 days after your surgery. Walk daily even outside. Use a cane or walker only if necessary. Avoid sitting on soft sofas.

## 2015-07-22 NOTE — Brief Op Note (Signed)
07/22/2015  12:30 PM  PATIENT:  [name]  70 y.o. 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  POST-OPERATIVE DIAGNOSIS:  stenosis L3-L4, L4-L5  PROCEDURE:  Procedure(s): REDO DECOMPRESSION LEVEL 2 L3-4 L4-5 (N/A)  SURGEON:  Surgeon(s) and Role:    * Susa Day, MD - Primary  PHYSICIAN ASSISTANT:   ASSISTANTS: [place]   ANESTHESIA:   general  EBL:  Total I/O In: 1000 [I.V.:1000] Out: -   BLOOD ADMINISTERED:none  DRAINS: none   LOCAL MEDICATIONS USED:  MARCAINE     SPECIMEN:  No Specimen  DISPOSITION OF SPECIMEN:  N/A  COUNTS:  YES  TOURNIQUET:  * No tourniquets in log *  DICTATION: .Other Dictation: Dictation Number [id number]  PLAN OF CARE: Admit for overnight observation  PATIENT DISPOSITION:  PACU - hemodynamically stable.   Delay start of Pharmacological VTE agent (>24hrs) due to surgical blood loss or risk of bleeding: yes

## 2015-07-22 NOTE — Interval H&P Note (Signed)
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 After consideration of risks, benefits and other options for treatment, the patient has consented to  Procedure(s): REDO DECOMPRESSION LEVEL 2 L3-4 L4-5 (N/A) as a surgical intervention .  The patient's history has been reviewed, patient examined, no change in status, stable for surgery.  I have reviewed the patient's chart and labs.  Questions were answered to the patient's satisfaction.     Hamed Debella C

## 2015-07-22 NOTE — Op Note (Signed)
NAME:  [name], [name] NO.:  [id number]  MEDICAL RECORD NO.:  [id number]  LOCATION:  [facility]                         FACILITY:  [facility]  PHYSICIAN:  Susa Day, M.D.    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  POSTOPERATIVE DIAGNOSIS:  Recurrent spinal stenosis at L3-4 and L4-5.  PROCEDURE PERFORMED: 1. Redo lumbar decompression, L3-4 and L4-5. 2. Foraminotomies, L3, L4, L5.  ANESTHESIA:  General.  ASSISTANT:  Cleophas Dunker, PA.  HISTORY:  This is a 70 year old male with history of lumbar decompression at L4-5.  By MRI, he had spinal stenosis at 3-4 and 4-5 and the lateral recess, severe; foraminal stenosis at 3, 4 and 5; degenerative scoliosis; no instability; no back pain.  He was indicated for redo decompression particularly at 3-4.  Risk and benefits were discussed including bleeding, infection, damage to neurovascular structures, no change in symptoms, worsening of symptoms, DVT, PE, anesthetic complications, etc.  The patient also had preoperative radiographs, which here at [facility] at last disk space at S1 and S2, when our [facility] MRI report that was designated at L5-S1.  No more creature was taken into account accordingly.  TECHNIQUE:  With the patient in supine position, after induction of adequate general anesthesia, 2 g of Kefzol, Foley to gravity, placed prone on the [place] frame.  All bony prominences were well padded. Lumbar region was prepped and draped in usual sterile fashion.  Two 18- gauge spinal needles were utilized to localize 3-4 and 4-5 interspace, confirmed with x-rays.  Incision was made from the spinous process of 3 to below the 4-5 interspace.  Spinous process of 4 had been removed. Subcutaneous tissue was dissected, electrocautery was  utilized to achieve hemostasis.  The residual dorsolumbar fascia identified, divided in line in skin incision.  Paraspinous muscle was elevated at 3, 4 and at 5 carefully bilaterally with Cobb elevators.  I taken care not to enter at the previous laminectomy, central laminectomy space at L4. Turned attention towards the left after McCullough retractor was placed and operating microscope was draped and brought on the surgical field, I spent several times skeletonizing the previous laminotomy.  This was at the 3, 4 and at 4-5.  Residual spinous process of 3 was noted.  We used a straight curette to mobilize a plane between the thecal sac in the epidural fibrosis on the left and the inferior lamina of 3, detaching it.  There was some ligamentum flavum noted here extending into the neural foramen of 3.  Fairly severe compression of the lateral recess noted here.  Revision hemilaminotomy of the caudad edge of 3 was then performed.  We continued the decompression laterally, decompression of the lateral recess.  Further mobilizing the plane between the epidural fibrosis and the dura, protecting the neural elements at all times. Stenotic foramen of 3 and 4 was noted.  We performed foraminotomies of 3 and of 4.  Residual lamina of 4 was noted after skeletonized and again protecting the dura, mobilizing and creating  a gentle plane between the two, removed the residual lamina of 4.  Performing a foraminotomy of 4 and decompressed the lateral recess at 4 and 5 as well.  No disk herniation was noted.  There was severe stenosis particularly at 3-4 and also at 4-5 with foraminotomy was performed at both.  No probe passed freely at the foramen of 3 and 4.  Prior to that, we had obtained a confirmatory radiograph at 3-4 and just at the foramen of 4.  We then continued down to the lateral recess of 4-5 and the disk space at 4-5. Good restoration of the thecal sac, no evidence of CSF leakage.   Bipolar electrocautery was utilized to achieve hemostasis as was bone wax. There was small synovial cyst of the facet at 3-4, this was removed as well.  The neural probe passed freely above the pedicle of 3 to below the pedicle of 5.  I felt there was an excellent decompression, no CSF leakage or active bleeding and copiously irrigated the wound with antibiotic irrigation and then removed the McCullough retractor. Paraspinous muscle was inspected, no evidence of active bleeding. Closed the dorsolumbar fascia with #1 Vicryl interrupted figure-of-eight sutures, subcu with 2-0, and skin with staples.  Wound was dressed sterilely, placed supine on the hospital bed, extubated without difficulty, and transported to the recovery room in satisfactory condition.  The patient tolerated the procedure well.  No complications.  Assistant, Cleophas Dunker, [place].  Minimal blood loss.     Susa Day, M.D.     Geralynn Rile  D:  07/22/2015  T:  07/22/2015  Job:  [id number]

## 2015-07-22 NOTE — H&P (View-Only) (Signed)
[name] is an 70 y.o. male.   Chief Complaint: back and B/L leg pain, weakness HPI: The patient is a 70 year old male who presents today for follow up of their back. The patient is being followed for their low back pain. They are now 9 month(s) (3 weeks) out from when symptoms began. Symptoms reported today include: pain, leg pain (bilateral), foot pain and pain with standing (walking). The patient states that they are doing poorly. Current treatment includes: bracing, relative rest, activity modification and pain medications. The following medication has been used for pain control: none. The patient reports their current pain level to be moderate to severe.  [name] follows up and reports persistent pain in the buttock, thigh and also flapping of the left leg when he attempts to ambulate.   He has reported this footdrop for three months. He reports that it is getting worse.  Past Medical History  Diagnosis Date  . PONV (postoperative nausea and vomiting)   . Diabetes mellitus without complication ([place])   . Hypertension   . Dysrhythmia 07-31-13     hx. PAC's   . Coronary artery disease 07-31-13    100 % blockage in left leg artery,affects left arm b/p readings  . Anginal pain ([place]) 07-31-13    coronary stent x1 last 12'14  . Heart murmur   . Peripheral vascular disease ([place])   . GERD (gastroesophageal reflux disease)   . History of kidney stones 07-31-13    x 1  . 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 Bursitis 07-31-13    heel  . Arthritis     osteoarthritis  . Plantar fasciitis of left foot 07-31-13    left leg  . Cancer ([place]) 07-31-13    skin cancer left cheek  . Rheumatic fever     when 70 years old    Past Surgical History  Procedure Laterality Date  . Cardiac catheterization      x1 stents 12'14.  . Renal artery stent Right 07-31-13  . Leg stent Right 07-31-13    right leg  . Femoral-peroneal bypass graft Right   .  Shoulder arthroscopy Left     RCTR  . Vasectomy    . Circumcision    . Lumbar laminectomy/decompression microdiscectomy N/A 08/05/2013    Procedure: MICRO LUMBAR DECOMPRESSION L4 - L5;  Surgeon: Johnn Hai, MD;  Location: [facility];  Service: Orthopedics;  Laterality: N/A;  . Stent in right kidney      No family history on file. Social History:  reports that he quit smoking about 3 years ago. His smoking use included Cigarettes. He has never used smokeless tobacco. He reports that he does not drink alcohol or use illicit drugs.  Allergies:  Allergies  Allergen Reactions  . Contrast Media [Iodinated Diagnostic Agents] Nausea And Vomiting  . Quinine Derivatives Other (See Comments)    Pt does not know      (Not in a hospital admission)  No results found for this or any previous visit (from the past 48 hour(s)). No results found.  Review of Systems  Constitutional: Negative.   HENT: Negative.   Eyes: Negative.   Respiratory: Negative.   Cardiovascular: Negative.   Gastrointestinal: Negative.   Genitourinary: Negative.   Musculoskeletal: Positive for back pain.  Skin: Negative.   Neurological: Positive for sensory change and focal weakness.  Psychiatric/Behavioral: Negative.     There  were no vitals taken for this visit. Physical Exam  Constitutional: He is oriented to person, place, and time. He appears well-developed.  HENT:  Head: Normocephalic.  Eyes: Pupils are equal, round, and reactive to light.  Neck: Normal range of motion.  Cardiovascular: Normal rate.   Respiratory: Effort normal.  GI: Soft.  Musculoskeletal:  On exam, he is upright. No distress. Mood and affect is appropriate. Straight leg raises, low back pain and buttock pain. He has 4+/5 dorsiflexion EHL on the left compared to the right, 1 to 2+ posterior tibial pulse, 1+ dorsalis pedis on the right, unable to palpate pulses on the left. Although he has capillary refill in the foot, the extremity is  viable.  Lumbar spine exam reveals no evidence of soft tissue swelling, deformity or skin ecchymosis. On palpation there is no tenderness of the lumbar spine. No flank pain with percussion. The abdomen is soft and nontender. Nontender over the trochanters. No cellulitis or lymphadenopathy.  Motor is 5/5 including tibialis anterior, plantar flexion, quadriceps and hamstrings. Patient is normoreflexic. There is no Babinski or clonus. Sensory exam is intact to light touch. Patient has good distal pulses. No DVT. No pain and normal range of motion without instability of the hips, knees and ankles.   Neurological: He is alert and oriented to person, place, and time.  Skin: Skin is warm.    His doppler indicates ABI of 0.77 for the lower extremity, consistent with mild occlusive disease at rest and the patient has a known left superficial femoral artery occlusion. There is no change in his studies since 08/30/2014. The ABI on his right was 1.1. He has quit smoking. He has a right SFA stent. He reports they were unable to stent the left, but he had collateral circulation.  His MRI does demonstrate progressive lateral constriction of the thecal sac with displacement of the left L4 nerve root. There is severe foraminal stenosis at 3-4. At 4-5, there are signs of decompression with foraminal stenosis. There is some lateral recess stenosis with effacement of the left L5 nerve root. There is osteoarthritis of the sacroiliac joint noted as well.  Assessment/Plan 1. L4 radiculopathy, monotomal weakness, dermatomal dysesthesias due to progressive collapse of the lateral recess. 2. Disc protrusion, lateral recess stenosis with vascular claudication with ABI of 0.77, no change since April 16. 3. History of tobacco use. Currently on chronic Plavix.  I had an extensive discussion concerning his current pathology, relevant anatomy and treatment options at this point in time. He does also have a history of gout. He has  more in terms of the tibialis anterior was L5 before. He does have problems going up and downstairs, but more when he is standing up walking and dragging his foot. Two options at this point in time, as he reports that it has been progressive: One would be to live with his symptoms and utilize a brace high top boot versus decompression at 3-4 and then possible revision decompression at 4-5. I had an extensive discussion of the risks and benefits of the lumbar decompression with the patient including bleeding, infection, damage to neurovascular structures, epidural fibrosis, CSF leak requiring repair. We also discussed increase in pain, adjacent segment disease, recurrent disc herniation, need for future surgery including repeat decompression and/or fusion. We also discussed risks of postoperative hematoma, paralysis, anesthetic complications including DVT, PE, death, cardiopulmonary dysfunction. In addition, the perioperative and postoperative courses were discussed in detail including the rehabilitative time and return to functional activity  and work. I provided the patient with an illustrated handout and utilized the appropriate surgical models. There is, however, no guarantee that his footdrop would improve following this. However, certainly, it has not improved and he has persistent buttock and thigh pain with it. He would have to come off his Plavix for the surgical intervention and then proceed accordingly. In the interim, I have instructed him to wear a high top boot, avoid extension, favor flexion. I do not feel an injection would be therapeutic in the presence of a footdrop. I will obtain preoperative clearance. If it is worsening in the interim, he is to call.  Plan redo decompression L3-4, L4-5  Tishawn Friedhoff, Conley Rolls., PA-C for Dr. Tonita Cong 07/19/2015, 12:22 PM

## 2015-07-22 NOTE — Anesthesia Preprocedure Evaluation (Addendum)
Anesthesia Evaluation  Patient identified by MRN, date of birth, ID band Patient awake    Reviewed: Allergy & Precautions, NPO status , Patient's Chart, lab work & pertinent test results  History of Anesthesia Complications (+) PONV  Airway Mallampati: I  TM Distance: >3 FB Neck ROM: Full    Dental  (+) Edentulous Upper, Edentulous Lower   Pulmonary former smoker,    breath sounds clear to auscultation       Cardiovascular hypertension, Pt. on medications + CAD   Rhythm:Regular Rate:Normal     Neuro/Psych    GI/Hepatic   Endo/Other  diabetes, Well Controlled, Type 2, Oral Hypoglycemic Agents  Renal/GU      Musculoskeletal   Abdominal   Peds  Hematology   Anesthesia Other Findings   Reproductive/Obstetrics                            Anesthesia Physical Anesthesia Plan  ASA: III  Anesthesia Plan: General   Post-op Pain Management:    Induction: Intravenous  Airway Management Planned: Oral ETT  Additional Equipment:   Intra-op Plan:   Post-operative Plan: Extubation in OR  Informed Consent: I have reviewed the patients History and Physical, chart, labs and discussed the procedure including the risks, benefits and alternatives for the proposed anesthesia with the patient or authorized representative who has indicated his/her understanding and acceptance.   Dental advisory given  Plan Discussed with: Anesthesiologist, CRNA and Surgeon  Anesthesia Plan Comments:         Anesthesia Quick Evaluation

## 2015-07-22 NOTE — Anesthesia Postprocedure Evaluation (Signed)
Anesthesia Post Note  Patient: [name]  Procedure(s) Performed: Procedure(s) (LRB): REDO DECOMPRESSION LEVEL 2 L3-4 L4-5 (N/A)  Patient location during evaluation: PACU Anesthesia Type: General Level of consciousness: awake and alert Pain management: pain level controlled Vital Signs Assessment: post-procedure vital signs reviewed and stable Respiratory status: spontaneous breathing, nonlabored ventilation, respiratory function stable and patient connected to nasal cannula oxygen Cardiovascular status: blood pressure returned to baseline and stable Postop Assessment: no signs of nausea or vomiting Anesthetic complications: no    Last Vitals:  Filed Vitals:   07/22/15 1500 07/22/15 1515  BP: 138/84 141/78  Pulse: 60 57  Temp:  36.4 C  Resp: 13 17    Last Pain:  Filed Vitals:   07/22/15 1524  PainSc: 3                  Sonali Wivell A

## 2015-07-22 NOTE — Anesthesia Procedure Notes (Signed)

## 2015-07-22 NOTE — Transfer of Care (Signed)
Immediate Anesthesia Transfer of Care Note  Patient: [name]  Procedure(s) Performed: Procedure(s): REDO DECOMPRESSION LEVEL 2 L3-4 L4-5 (N/A)  Patient Location: PACU  Anesthesia Type:General  Level of Consciousness:  sedated, patient cooperative and responds to stimulation  Airway & Oxygen Therapy:Patient Spontanous Breathing and Patient connected to face mask oxgen  Post-op Assessment:  Report given to PACU RN and Post -op Vital signs reviewed and stable  Post vital signs:  Reviewed and stable  Last Vitals:  Filed Vitals:   07/22/15 0715  BP: 132/65  Pulse: 66  Temp: 36.4 C  Resp: 16    Complications: No apparent anesthesia complications

## 2015-07-23 DIAGNOSIS — M4806 Spinal stenosis, lumbar region: Secondary | ICD-10-CM | POA: Diagnosis not present

## 2015-07-23 LAB — BASIC METABOLIC PANEL
Anion gap: 9 (ref 5–15)
BUN: 18 mg/dL (ref 6–20)
CALCIUM: 8.6 mg/dL — AB (ref 8.9–10.3)
CHLORIDE: 105 mmol/L (ref 101–111)
CO2: 25 mmol/L (ref 22–32)
CREATININE: 1.38 mg/dL — AB (ref 0.61–1.24)
GFR calc non Af Amer: 50 mL/min — ABNORMAL LOW (ref >60)
GFR, EST AFRICAN AMERICAN: 57 mL/min — AB (ref >60)
Glucose, Bld: 192 mg/dL — ABNORMAL HIGH (ref 65–99)
Potassium: 4.8 mmol/L (ref 3.5–5.1)
SODIUM: 139 mmol/L (ref 135–145)

## 2015-07-23 LAB — CBC
HCT: 39.5 % (ref 39.0–52.0)
Hemoglobin: 13.5 g/dL (ref 13.0–17.0)
MCH: 31.5 pg (ref 26.0–34.0)
MCHC: 34.2 g/dL (ref 30.0–36.0)
MCV: 92.1 fL (ref 78.0–100.0)
Platelets: 213 10*3/uL (ref 150–400)
RBC: 4.29 MIL/uL (ref 4.22–5.81)
RDW: 12.8 % (ref 11.5–15.5)
WBC: 13.9 10*3/uL — ABNORMAL HIGH (ref 4.0–10.5)

## 2015-07-23 LAB — GLUCOSE, CAPILLARY: GLUCOSE-CAPILLARY: 147 mg/dL — AB (ref 65–99)

## 2015-07-23 NOTE — Evaluation (Signed)
Occupational Therapy Evaluation Patient Details Name: [name] MRN: [id number] DOB: 1945/07/15 Today's Date: 07/23/2015    History of Present Illness Redo L3-4-5 decomp   Clinical Impression   This 70 year old man was admitted for the above surgery.  All education was completed. No further OT is needed at this time    Follow Up Recommendations  No OT follow up    Equipment Recommendations  None recommended by OT    Recommendations for Other Services       Precautions / Restrictions Precautions Precautions: Back;Fall Precaution Booklet Issued: Yes (comment) Precaution Comments: Unsteady on feet - hx of peripheral neropathy Restrictions Weight Bearing Restrictions: No      Mobility Bed Mobility Overal bed mobility: Needs Assistance Bed Mobility: Supine to Sit          General bed mobility comments: oob  Transfers Overall transfer level: Needs assistance Equipment used: None;Rolling walker (2 wheeled) Transfers: Sit to/from Stand Sit to Stand: Min guard         General transfer comment: for safety    Balance Overall balance assessment: Needs assistance Sitting-balance support: No upper extremity supported;Feet supported Sitting balance-Leahy Scale: Good     Standing balance support: No upper extremity supported Standing balance-Leahy Scale: Fair                              ADL Overall ADL's : Needs assistance/impaired             Lower Body Bathing: Minimal assistance;Sit to/from stand       Lower Body Dressing: Minimal assistance;Sit to/from stand   Toilet Transfer: Min guard;Ambulation;BSC   Toileting- Water quality scientist and Hygiene: Min guard;Sit to/from stand         General ADL Comments: Pt donned shirt with supervision, shoes with min A.  Pt is able to cross legs partially painfree--this allows him to remove sock and start pants. He needs assistance for shoes and donning socks.  Reviewed all education with  pt.  He has had some difficulty with toilet hygiene but has been able to manage with wipes.  pt steady with RW in room; min guard for safety due to peripheral neuropathy     Vision     Perception     Praxis      Pertinent Vitals/Pain Pain Assessment: No/denies pain     Hand Dominance     Extremity/Trunk Assessment Upper Extremity Assessment Upper Extremity Assessment: Overall WFL for tasks assessed      Cervical / Trunk Assessment Cervical / Trunk Assessment: Normal   Communication Communication Communication: No difficulties   Cognition Arousal/Alertness: Awake/alert Behavior During Therapy: WFL for tasks assessed/performed Overall Cognitive Status: Within Functional Limits for tasks assessed                     General Comments       Exercises       Shoulder Instructions      Home Living Family/patient expects to be discharged to:: Private residence Living Arrangements: Spouse/significant other Available Help at Discharge: Family Type of Home: House Home Access: Stairs to enter Technical brewer of Steps: 2 Entrance Stairs-Rails: None Home Layout: Able to live on main level with bedroom/bathroom     Bathroom Shower/Tub: Occupational psychologist: Handicapped height     Home Equipment: Environmental consultant - 2 wheels;Cane - single point  Prior Functioning/Environment Level of Independence: Independent             OT Diagnosis: Generalized weakness   OT Problem List:     OT Treatment/Interventions:      OT Goals(Current goals can be found in the care plan section) Acute Rehab OT Goals Patient Stated Goal: Resume previous lifestyle with decreased pain  OT Frequency:     Barriers to D/C:            Co-evaluation              End of Session    Activity Tolerance: Patient tolerated treatment well Patient left: in chair;with call bell/phone within reach   Time: [id number] OT Time Calculation (min): 22  min Charges:  OT General Charges $OT Visit: 1 Procedure OT Evaluation $OT Eval Low Complexity: 1 Procedure G-Codes: OT G-codes **NOT FOR INPATIENT CLASS** Functional Assessment Tool Used: clinical observation and judgment Functional Limitation: Self care Self Care Current Status [id number]): At least 40 percent but less than 60 percent impaired, limited or restricted Self Care Goal Status [id number]): At least 40 percent but less than 60 percent impaired, limited or restricted Self Care Discharge Status [phone]): At least 40 percent but less than 60 percent impaired, limited or restricted  [facility] 07/23/2015, 10:49 AM Lesle Chris, OTR/L [phone] 07/23/2015

## 2015-07-23 NOTE — Evaluation (Signed)
Physical Therapy Evaluation Patient Details Name: [name] MRN: [id number] DOB: May 19, 1946 Today's Date: 07/23/2015   History of Present Illness  Redo L3-4-5 decomp  Clinical Impression  Pt s/p back surgery presents with functional mobility limitations 2* back precautions and ambulatory balance deficits.  Pt should progress to dc home with family assist.    Follow Up Recommendations No PT follow up    Equipment Recommendations  None recommended by PT    Recommendations for Other Services OT consult     Precautions / Restrictions Precautions Precautions: Back;Fall Precaution Booklet Issued: Yes (comment) Precaution Comments: Unsteady on feet - hx of peripheral neropathy Restrictions Weight Bearing Restrictions: No      Mobility  Bed Mobility Overal bed mobility: Needs Assistance Bed Mobility: Supine to Sit     Supine to sit: Supervision     General bed mobility comments: cues for correct log roll technique  Transfers Overall transfer level: Needs assistance Equipment used: None Transfers: Sit to/from Stand Sit to Stand: Min guard         General transfer comment: min guard to steady with initial standing;  Cues for back precaution adherence and transition position  Ambulation/Gait Ambulation/Gait assistance: Min assist;Supervision Ambulation Distance (Feet): 400 Feet Assistive device: Rolling walker (2 wheeled);1 person hand held assist Gait Pattern/deviations: Step-through pattern;Decreased step length - right;Decreased step length - left;Trunk flexed;Shuffle;Wide base of support     General Gait Details: cues for posture and position from RW.  Pt ambulated 36' with HHA but with noted instability and reaching out to steady.  Largely corrected with use of RW  Stairs Stairs: Yes Stairs assistance: Supervision Stair Management:  (doorframe) Number of Stairs: 2    Wheelchair Mobility    Modified Rankin (Stroke Patients Only)       Balance  Overall balance assessment: Needs assistance Sitting-balance support: No upper extremity supported;Feet supported Sitting balance-Leahy Scale: Good     Standing balance support: No upper extremity supported Standing balance-Leahy Scale: Fair                               Pertinent Vitals/Pain Pain Assessment: No/denies pain    Home Living Family/patient expects to be discharged to:: Private residence Living Arrangements: Spouse/significant other Available Help at Discharge: Family Type of Home: House Home Access: Stairs to enter Entrance Stairs-Rails: None Entrance Stairs-Number of Steps: 2 Home Layout: Able to live on main level with bedroom/bathroom Home Equipment: Walker - 2 wheels;Cane - single point      Prior Function Level of Independence: Independent               Hand Dominance        Extremity/Trunk Assessment   Upper Extremity Assessment: Overall WFL for tasks assessed           Lower Extremity Assessment: RLE deficits/detail;LLE deficits/detail      Cervical / Trunk Assessment: Normal  Communication   Communication: No difficulties  Cognition Arousal/Alertness: Awake/alert Behavior During Therapy: WFL for tasks assessed/performed Overall Cognitive Status: Within Functional Limits for tasks assessed                      General Comments      Exercises        Assessment/Plan    PT Assessment Patient needs continued PT services  PT Diagnosis Difficulty walking   PT Problem List Decreased strength;Decreased activity tolerance;Decreased balance;Decreased mobility;Decreased knowledge of  use of DME;Decreased knowledge of precautions  PT Treatment Interventions DME instruction;Gait training;Stair training;Functional mobility training;Therapeutic activities;Therapeutic exercise;Patient/family education   PT Goals (Current goals can be found in the Care Plan section) Acute Rehab PT Goals Patient Stated Goal: Resume  previous lifestyle with decreased pain PT Goal Formulation: With patient Time For Goal Achievement: 07/24/15 Potential to Achieve Goals: Good    Frequency 7X/week   Barriers to discharge        Co-evaluation               End of Session   Activity Tolerance: Patient tolerated treatment well Patient left: in chair;with call bell/phone within reach Nurse Communication: Mobility status    Functional Assessment Tool Used: clinical judgement Functional Limitation: Mobility: Walking and moving around Mobility: Walking and Moving Around Current Status [id number]): At least 1 percent but less than 20 percent impaired, limited or restricted Mobility: Walking and Moving Around Goal Status [phone]): At least 1 percent but less than 20 percent impaired, limited or restricted    Time: 0818-0848 PT Time Calculation (min) (ACUTE ONLY): 30 min   Charges:   PT Evaluation $PT Eval Low Complexity: 1 Procedure PT Treatments $Gait Training: 8-22 mins   PT G Codes:   PT G-Codes **NOT FOR INPATIENT CLASS** Functional Assessment Tool Used: clinical judgement Functional Limitation: Mobility: Walking and moving around Mobility: Walking and Moving Around Current Status [id number]): At least 1 percent but less than 20 percent impaired, limited or restricted Mobility: Walking and Moving Around Goal Status [phone]): At least 1 percent but less than 20 percent impaired, limited or restricted    Leonore Frankson 07/23/2015, 9:07 AM

## 2015-07-23 NOTE — Care Management Note (Signed)
Case Management Note  Patient Details  Name: [name] MRN: [id number] Date of Birth: 1945/06/12  Subjective/Objective:                  Lumbar decompression  Action/Plan: CM spoke with patient at the bedside. Patient states he has a walker, crutches and a cane at home. He lives with his wife who is able to assist him when he returns home. He expects to attend outpatient physical therapy after completing his first follow-up appointment post-discharge. No needs identified.   Expected Discharge Date:     07/23/15             Expected Discharge Plan:  Home/Self Care  In-House Referral:     Discharge planning Services  CM Consult  Post Acute Care Choice:  NA Choice offered to:  NA  DME Arranged:  N/A DME Agency:  NA  HH Arranged:  NA HH Agency:  NA  Status of Service:  Completed, signed off  Medicare Important Message Given:    Date Medicare IM Given:    Medicare IM give by:    Date Additional Medicare IM Given:    Additional Medicare Important Message give by:     If discussed at [place] of Stay Meetings, dates discussed:    Additional Comments:  Apolonio Schneiders, RN 07/23/2015, 10:37 AM

## 2015-07-23 NOTE — Discharge Summary (Signed)
Physician Discharge Summary  Patient ID: [name] MRN: [id number] DOB/AGE: 1945-10-08 70 y.o.  Admit date: 07/22/2015 Discharge date: 07/23/2015  Admission Diagnoses:  Discharge Diagnoses:  Active Problems:   Spinal stenosis of lumbar region   Discharged Condition: good  Hospital Course: Tolerated surgery well  Consults: None  Significant Diagnostic Studies: None  Treatments: surgery: Lumbar decomopression  Discharge Exam: Blood pressure 143/58, pulse 68, temperature 97.1 F (36.2 C), temperature source Oral, resp. rate 16, height 5\' 9"  (1.753 m), weight 83.462 kg (184 lb), SpO2 95 %. Neuro intact. No DVT  Disposition: 01-Home or Self Care  Discharge Instructions    Call MD / Call 911    Complete by:  As directed   If you experience chest pain or shortness of breath, CALL 911 and be transported to the hospital emergency room.  If you develope a fever above 101 F, pus (white drainage) or increased drainage or redness at the wound, or calf pain, call your surgeon's office.     Constipation Prevention    Complete by:  As directed   Drink plenty of fluids.  Prune juice may be helpful.  You may use a stool softener, such as Colace (over the counter) 100 mg twice a day.  Use MiraLax (over the counter) for constipation as needed.     Diet - low sodium heart healthy    Complete by:  As directed      Increase activity slowly as tolerated    Complete by:  As directed             Medication List    STOP taking these medications        aspirin EC 81 MG tablet     clopidogrel 75 MG tablet  Commonly known as:  PLAVIX     HYDROcodone-acetaminophen 5-325 MG tablet  Commonly known as:  NORCO/VICODIN      TAKE these medications        acetaminophen 500 MG tablet  Commonly known as:  TYLENOL  Take 2 tablets (1,000 mg total) by mouth every 6 (six) hours as needed.     carvedilol 3.125 MG tablet  Commonly known as:  COREG  Take 3.125 mg by mouth 2 (two) times daily  with a meal.     docusate sodium 100 MG capsule  Commonly known as:  COLACE  Take 1 capsule (100 mg total) by mouth 2 (two) times daily as needed for mild constipation.     Fish Oil 1200 MG Cpdr  Take 1,200 mg by mouth 2 (two) times daily.     losartan 100 MG tablet  Commonly known as:  COZAAR  Take 100 mg by mouth daily.     metFORMIN 1000 MG tablet  Commonly known as:  GLUCOPHAGE  Take 1,000 mg by mouth daily.     nitroGLYCERIN 0.4 MG SL tablet  Commonly known as:  NITROSTAT  Place 0.4 mg under the tongue every 5 (five) minutes as needed for chest pain.     oseltamivir 75 MG capsule  Commonly known as:  TAMIFLU  Take 1 capsule (75 mg total) by mouth every 12 (twelve) hours.     oxyCODONE-acetaminophen 5-325 MG tablet  Commonly known as:  PERCOCET  Take 1 tablet by mouth every 4 (four) hours as needed for severe pain.     pantoprazole 40 MG tablet  Commonly known as:  PROTONIX  Take 40 mg by mouth daily.     polyvinyl alcohol 1.4 %  ophthalmic solution  Commonly known as:  LIQUIFILM TEARS  Place 1 drop into both eyes 2 (two) times daily as needed for dry eyes.     simvastatin 20 MG tablet  Commonly known as:  ZOCOR  Take 20 mg by mouth daily.     traMADol 50 MG tablet  Commonly known as:  ULTRAM  Take 1 tablet (50 mg total) by mouth every 6 (six) hours as needed.     Vitamin D (Ergocalciferol) 50000 units Caps capsule  Commonly known as:  DRISDOL  Take 50,000 Units by mouth every 7 (seven) days.           Follow-up Information    Follow up with Rosy Estabrook C, MD In 2 weeks.   Specialty:  Orthopedic Surgery   Contact information:   [address] [place] [address] [phone]       Signed: Johnn Hai 07/23/2015, 8:53 AM

## 2015-07-23 NOTE — Progress Notes (Signed)
Subjective: 1 Day Post-Op Procedure(s) (LRB): REDO DECOMPRESSION LEVEL 2 L3-4 L4-5 (N/A) Patient reports pain as 3 on 0-10 scale.   Feels great. No leg pain Objective: Vital signs in last 24 hours: Temp:  [97.1 F (36.2 C)-98 F (36.7 C)] 97.1 F (36.2 C) (03/04 0545) Pulse Rate:  [50-75] 68 (03/04 0545) Resp:  [8-20] 16 (03/04 0545) BP: (134-178)/(58-89) 143/58 mmHg (03/04 0545) SpO2:  [94 %-100 %] 95 % (03/04 0545) Weight:  [83.462 kg (184 lb)] 83.462 kg (184 lb) (03/03 1539)  Intake/Output from previous day: 03/03 0701 - 03/04 0700 In: 3032.5 [P.O.:720; I.V.:2257.5; IV Piggyback:55] Out: [id number] [Urine:2550] Intake/Output this shift:     Recent Labs  07/23/15 0501  HGB 13.5    Recent Labs  07/23/15 0501  WBC 13.9*  RBC 4.29  HCT 39.5  PLT 213    Recent Labs  07/23/15 0501  NA 139  K 4.8  CL 105  CO2 25  BUN 18  CREATININE 1.38*  GLUCOSE 192*  CALCIUM 8.6*   No results for input(s): LABPT, INR in the last 72 hours.  Neurologically intact Intact pulses distally Dorsiflexion/Plantar flexion intact Incision: dressing C/D/I No cellulitis present  Assessment/Plan: 1 Day Post-Op Procedure(s) (LRB): REDO DECOMPRESSION LEVEL 2 L3-4 L4-5 (N/A) Advance diet D/C IV fluids Discharge home with home health  Nevelyn Mellott C 07/23/2015, 8:34 AM

## 2019-01-28 ENCOUNTER — Encounter: Payer: Self-pay | Admitting: Gastroenterology

## 2019-02-27 ENCOUNTER — Telehealth: Payer: Self-pay

## 2019-02-27 ENCOUNTER — Encounter: Payer: Self-pay | Admitting: Gastroenterology

## 2019-02-27 ENCOUNTER — Ambulatory Visit (INDEPENDENT_AMBULATORY_CARE_PROVIDER_SITE_OTHER): Payer: No Typology Code available for payment source | Admitting: Gastroenterology

## 2019-02-27 VITALS — BP 136/80 | HR 77 | Temp 98.6°F | Ht 69.0 in | Wt 190.0 lb

## 2019-02-27 DIAGNOSIS — K5900 Constipation, unspecified: Secondary | ICD-10-CM | POA: Diagnosis not present

## 2019-02-27 DIAGNOSIS — Z86010 Personal history of colonic polyps: Secondary | ICD-10-CM

## 2019-02-27 DIAGNOSIS — R194 Change in bowel habit: Secondary | ICD-10-CM | POA: Diagnosis not present

## 2019-02-27 MED ORDER — PEG 3350-KCL-NA BICARB-NACL 420 G PO SOLR
4000.0000 mL | ORAL | 0 refills | Status: DC
Start: 2019-02-27 — End: 2019-03-04

## 2019-02-27 NOTE — Progress Notes (Signed)
HPI: This is a very pleasant 73 year old man who was referred to me by Henreitta Cea (??title at [facility])  Chief complaint is change in bowels, constipation, personal history of adenomatous colon polyps   there is a 28 page packet of information sent by the [place].  In this it says he actually had also colonoscopy March 2018.  We do not have any of those records.  Some lab testing from July 2020 showing a essentially normal CBC with a hemoglobin of 13.7.  There is an barium esophagram report dated February 2018 with the impression "presbyesophagus.  Hiatal hernia with reflux.  Question Barrett's esophagus"  I have difficulty in seeing who sent him from the [place] and is extremely lengthy packet  1 page states that his chief complaint is "fecal incontinence.  There is notation that he could have a colonoscopy possibly an upper endoscopy, also anorectal manometry.  This documentation from the [place] is as usual very difficult to sort through comprehend.  He tells me for years he was having 2-3 times a day of fecal incontinence.  This ended about a year ago and since then he has been relatively constipated.  He has to push and strain to move his bowels.  He actually applies Vaseline to his anus and that tends to help.  He still does have some urgency.  He has never seen blood in his stool.  He does not have a family history of colon cancer.  His weight is up 10 pounds in the past 6 months.  He is on Plavix for multiple peripheral vascular stents.  He says this is prescribed now by his cardiologist in [facility].  Did not recall that name.  Old Data Reviewed:  He brought with him a copy of a April 2012 colonoscopy done for screening for colon cancer.  This was done by Dr. Michail Sermon at [facility] gastroenterology April 2012.  Dr. Michail Sermon removed 9 minuscule polyps from his rectum.  Also a sigmoid 5 mm polyp as well as a 1.2 cm rectal polyp.  He noted diverticulosis in the sigmoid and ascending colon.   Pathology report shows that many of these polyps were tubular adenomas.  No high-grade dysplasia was noted.  There is handwritten recommendations on this stating that he wanted to have a repeat colonoscopy at 3-year interval.     Review of systems: Pertinent positive and negative review of systems were noted in the above HPI section. All other review negative.   Past Medical History:  Diagnosis Date  . Anginal pain ([place]) 07-31-13   coronary stent x1 last 12'14  . Arthritis    osteoarthritis  . Bursitis 07-31-13   heel  . Cancer ([place]) 07-31-13   skin cancer left cheek  . 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 Coronary artery disease 07-31-13   100 % blockage in left leg artery,affects left arm b/p readings  . Diabetes mellitus without complication ([place])   . Dysrhythmia 07-31-13    hx. PAC's   . GERD (gastroesophageal reflux disease)   . Heart murmur   . History of kidney stones 07-31-13   x 1  . Hypertension   . Peripheral vascular disease ([place])   . Plantar fasciitis of left foot 07-31-13   left leg  . PONV (postoperative nausea and vomiting)   . Rheumatic fever    when 73 years old    Past Surgical History:  Procedure Laterality Date  .  CARDIAC CATHETERIZATION     x1 stents 12'14.  . CIRCUMCISION    . FEMORAL-PERONEAL BYPASS GRAFT Right   . leg stent Right 07-31-13   right leg  . LUMBAR LAMINECTOMY/DECOMPRESSION MICRODISCECTOMY N/A 08/05/2013   Procedure: MICRO LUMBAR DECOMPRESSION L4 - L5;  Surgeon: Johnn Hai, MD;  Location: [facility];  Service: Orthopedics;  Laterality: N/A;  . LUMBAR LAMINECTOMY/DECOMPRESSION MICRODISCECTOMY N/A 07/22/2015   Procedure: REDO DECOMPRESSION LEVEL 2 L3-4 L4-5;  Surgeon: Susa Day, MD;  Location: [facility];  Service: Orthopedics;  Laterality: N/A;  . RENAL ARTERY STENT Right 07-31-13  . SHOULDER ARTHROSCOPY Left    RCTR  . stent in Right kidney    . VASECTOMY      Current Outpatient  Medications  Medication Sig Dispense Refill  . acetaminophen (TYLENOL) 500 MG tablet Take 2 tablets (1,000 mg total) by mouth every 6 (six) hours as needed. 30 tablet 0  . carvedilol (COREG) 3.125 MG tablet Take 3.125 mg by mouth 2 (two) times daily with a meal.    . clopidogrel (PLAVIX) 75 MG tablet Take 75 mg by mouth daily.    . famotidine (PEPCID) 20 MG tablet Take 20 mg by mouth at bedtime.    Marland Kitchen glyBURIDE (DIABETA) 2.5 MG tablet Take 2.5 mg by mouth daily with breakfast.    . LACTOBACILLUS PROBIOTIC PO Take by mouth.    . losartan (COZAAR) 100 MG tablet Take 100 mg by mouth daily.  1  . metFORMIN (GLUCOPHAGE) 1000 MG tablet Take 1,000 mg by mouth daily.    . nitroGLYCERIN (NITROSTAT) 0.4 MG SL tablet Place 0.4 mg under the tongue every 5 (five) minutes as needed for chest pain.    . Omega-3 Fatty Acids (FISH OIL) 1200 MG CPDR Take 1,200 mg by mouth 2 (two) times daily.     . pantoprazole (PROTONIX) 40 MG tablet Take 40 mg by mouth daily.   5  . polyvinyl alcohol (LIQUIFILM TEARS) 1.4 % ophthalmic solution Place 1 drop into both eyes 2 (two) times daily as needed for dry eyes.    . pravastatin (PRAVACHOL) 40 MG tablet Take 40 mg by mouth daily.    . traMADol (ULTRAM) 50 MG tablet Take 1 tablet (50 mg total) by mouth every 6 (six) hours as needed. (Patient not taking: Reported on 07/13/2015) 20 tablet 0  . Vitamin D, Ergocalciferol, (DRISDOL) 50000 UNITS CAPS capsule Take 50,000 Units by mouth every 7 (seven) days.      No current facility-administered medications for this visit.     Allergies as of 02/27/2019 - Review Complete 02/27/2019  Allergen Reaction Noted  . Contrast media [iodinated diagnostic agents] Nausea And Vomiting 07/28/2013  . Quinine derivatives Other (See Comments) 07/28/2013    Family History  Problem Relation Age of Onset  . Kidney disease Mother   . Pancreatic cancer Mother   . Lung cancer Father     Social History   Socioeconomic History  . Marital  status: Married    Spouse name: Not on file  . Number of children: Not on file  . Years of education: Not on file  . Highest education level: Not on file  Occupational History  . Not on file  Social Needs  . Financial resource strain: Not on file  . Food insecurity    Worry: Not on file    Inability: Not on file  . Transportation needs    Medical: Not on file    Non-medical: Not on file  Tobacco Use  . Smoking status: Former Smoker    Types: Cigarettes    Quit date: 08/01/2011    Years since quitting: 7.5  . Smokeless tobacco: Never Used  Substance and Sexual Activity  . Alcohol use: No  . Drug use: No  . Sexual activity: Yes  Lifestyle  . Physical activity    Days per week: Not on file    Minutes per session: Not on file  . Stress: Not on file  Relationships  . Social Herbalist on phone: Not on file    Gets together: Not on file    Attends religious service: Not on file    Active member of club or organization: Not on file    Attends meetings of clubs or organizations: Not on file    Relationship status: Not on file  . Intimate partner violence    Fear of current or ex partner: Not on file    Emotionally abused: Not on file    Physically abused: Not on file    Forced sexual activity: Not on file  Other Topics Concern  . Not on file  Social History Narrative  . Not on file     Physical Exam: BP 136/80   Pulse 77   Temp 98.6 F (37 C)   Ht 5\' 9"  (1.753 m)   Wt 190 lb (86.2 kg)   SpO2 98%   BMI 28.06 kg/m  Constitutional: generally well-appearing Psychiatric: alert and oriented x3 Eyes: extraocular movements intact Mouth: oral pharynx moist, no lesions Neck: supple no lymphadenopathy Cardiovascular: heart regular rate and rhythm Lungs: clear to auscultation bilaterally Abdomen: soft, nontender, nondistended, no obvious ascites, no peritoneal signs, normal bowel sounds Extremities: no lower extremity edema bilaterally Skin: no lesions on  visible extremities   Assessment and plan: 73 y.o. male with change in bowels, personal history of adenomatous colon polyps, ongoing blood thinner use  First he had multiple adenomatous polyps removed in 2012 or 13 and he has not had a repeat examination since.  I recommended that he do undergo a repeat colonoscopy at his soonest convenience.  He takes Plavix and understands that we will increase his risk for bleeding during a colonoscopy.  He will need to hold the Plavix for 5 days prior.  We will reach out to his [facility] cardiologist to make sure they agree with the safety of that recommendation.  In the meantime he will start taking a daily fiber supplement with Citrucel to see if that can help even out his relative change in bowel habits and constipation lately.    Please see the "Patient Instructions" section for addition details about the plan.   Owens Loffler, MD [facility] 02/27/2019, 2:46 PM  Cc: [place], Norva Riffle, [place]*

## 2019-02-27 NOTE — Telephone Encounter (Signed)
[facility] Pre-operative Risk Assessment     Request for surgical clearance:     Endoscopy Procedure  What type of surgery is being performed?     colonoscopy  When is this surgery scheduled?     03/24/19  What type of clearance is required ?   Pharmacy  Are there any medications that need to be held prior to surgery and how long? Plavix  Practice name and name of physician performing surgery?      [facility]  What is your office phone and fax number?      Phone- [phone]  [phone]  Anesthesia type (None, local, MAC, general) ?       MAC

## 2019-02-27 NOTE — Patient Instructions (Signed)
You have been scheduled for a colonoscopy. Please follow written instructions given to you at your visit today.  Please pick up your prep supplies at the pharmacy within the next 1-3 days. If you use inhalers (even only as needed), please bring them with you on the day of your procedure. Your physician has requested that you go to www.startemmi.com and enter the access code given to you at your visit today. This web site gives a general overview about your procedure. However, you should still follow specific instructions given to you by our office regarding your preparation for the procedure.  Please start taking Citrucel(orange flavored) powder supplement.This may cause some bloating at first,but that usually goes away.Begin with a small spoonful and work your way up to a large,heaping spoonful daily over a week  Thank you for entrusting me with your care and choosing [facility].  Dr Ardis Hughs

## 2019-03-04 ENCOUNTER — Telehealth: Payer: Self-pay

## 2019-03-04 ENCOUNTER — Other Ambulatory Visit: Payer: Self-pay | Admitting: Gastroenterology

## 2019-03-04 MED ORDER — PEG 3350-KCL-NA BICARB-NACL 420 G PO SOLR: 4000.0000 mL | ORAL | 0 refills | Status: DC

## 2019-03-04 NOTE — Telephone Encounter (Signed)
Nydia Bouton RN from the [place] called back with verbal orders from Dr Arletha Grippe. Patient will hold Plavix for 5 days prior to his colonoscopy on 03/24/19. He will restart Plavix the day after his procedure. Patient has been notified of these instructions and voiced understanding.

## 2019-03-24 ENCOUNTER — Other Ambulatory Visit: Payer: Self-pay

## 2019-03-24 ENCOUNTER — Encounter: Payer: Self-pay | Admitting: Gastroenterology

## 2019-03-24 ENCOUNTER — Ambulatory Visit (AMBULATORY_SURGERY_CENTER): Payer: No Typology Code available for payment source | Admitting: Gastroenterology

## 2019-03-24 VITALS — BP 96/72 | HR 66 | Temp 98.6°F | Resp 18 | Wt 190.0 lb

## 2019-03-24 DIAGNOSIS — K5909 Other constipation: Secondary | ICD-10-CM | POA: Diagnosis not present

## 2019-03-24 DIAGNOSIS — K5730 Diverticulosis of large intestine without perforation or abscess without bleeding: Secondary | ICD-10-CM

## 2019-03-24 DIAGNOSIS — K5520 Angiodysplasia of colon without hemorrhage: Secondary | ICD-10-CM

## 2019-03-24 MED ORDER — SODIUM CHLORIDE 0.9 % IV SOLN: 500.0000 mL | Freq: Once | INTRAVENOUS | Status: DC

## 2019-03-24 NOTE — Progress Notes (Signed)
To PACU, VSS. Report to [email or web address] 

## 2019-03-24 NOTE — Patient Instructions (Signed)
Plese read all of the handouts given to you by your recovery room nurse.  Try to use citrucel or benefiber every day to help with your fiber intake. Thank-you for choosing [place] for your healthcare needs today.  YOU HAD AN ENDOSCOPIC PROCEDURE TODAY AT [facility]:   Refer to the procedure report that was given to you for any specific questions about what was found during the examination.  If the procedure report does not answer your questions, please call your gastroenterologist to clarify.  If you requested that your care partner not be given the details of your procedure findings, then the procedure report has been included in a sealed envelope for you to review at your convenience later.  YOU SHOULD EXPECT: Some feelings of bloating in the abdomen. Passage of more gas than usual.  Walking can help get rid of the air that was put into your GI tract during the procedure and reduce the bloating. If you had a lower endoscopy (such as a colonoscopy or flexible sigmoidoscopy) you may notice spotting of blood in your stool or on the toilet paper. If you underwent a bowel prep for your procedure, you may not have a normal bowel movement for a few days.  Please Note:  You might notice some irritation and congestion in your nose or some drainage.  This is from the oxygen used during your procedure.  There is no need for concern and it should clear up in a day or so.  SYMPTOMS TO REPORT IMMEDIATELY:   Following lower endoscopy (colonoscopy or flexible sigmoidoscopy):  Excessive amounts of blood in the stool  Significant tenderness or worsening of abdominal pains  Swelling of the abdomen that is new, acute  Fever of 100F or higher    For urgent or emergent issues, a gastroenterologist can be reached at any hour by calling [phone].   DIET:  We do recommend a small meal at first, but then you may proceed to your regular diet.  Drink plenty of fluids but you should avoid alcoholic  beverages for 24 hours. Try to increase the fiber in your diet, and drink plenty of water.  ACTIVITY:  You should plan to take it easy for the rest of today and you should NOT DRIVE or use heavy machinery until tomorrow (because of the sedation medicines used during the test).    FOLLOW UP: Our staff will call the number listed on your records 48-72 hours following your procedure to check on you and address any questions or concerns that you may have regarding the information given to you following your procedure. If we do not reach you, we will leave a message.  We will attempt to reach you two times.  During this call, we will ask if you have developed any symptoms of COVID 19. If you develop any symptoms (ie: fever, flu-like symptoms, shortness of breath, cough etc.) before then, please call [phone].  If you test positive for Covid 19 in the 2 weeks post procedure, please call and report this information to [place].     SIGNATURES/CONFIDENTIALITY: You and/or your care partner have signed paperwork which will be entered into your electronic medical record.  These signatures attest to the fact that that the information above on your After Visit Summary has been reviewed and is understood.  Full responsibility of the confidentiality of this discharge information lies with you and/or your care-partner.

## 2019-03-24 NOTE — Op Note (Signed)
[facility] Patient Name: Rommy Alford Procedure Date: 03/24/2019 2:14 PM MRN: [id number] Endoscopist: Milus Banister , MD Age: 73 Referring MD:  Date of Birth: [birth year] Gender: Male Account #: [id number] Procedure:                Colonoscopy Indications:              High risk colon cancer surveillance: Personal                            history of colonic polyps: colonoscopy 2012 Dr.                            Michail Sermon several subCM adenomas removed, recent                            constipation Medicines:                Monitored Anesthesia Care Procedure:                Pre-Anesthesia Assessment:                           - Prior to the procedure, a History and Physical                            was performed, and patient medications and                            allergies were reviewed. The patient's tolerance of                            previous anesthesia was also reviewed. The risks                            and benefits of the procedure and the sedation                            options and risks were discussed with the patient.                            All questions were answered, and informed consent                            was obtained. Prior Anticoagulants: The patient has                            taken Plavix (clopidogrel), last dose was 5 days                            prior to procedure. ASA Grade Assessment: III - A                            patient with severe systemic disease. After  reviewing the risks and benefits, the patient was                            deemed in satisfactory condition to undergo the                            procedure.                           After obtaining informed consent, the colonoscope                            was passed under direct vision. Throughout the                            procedure, the patient's blood pressure, pulse, and                            oxygen  saturations were monitored continuously. The                            Colonoscope was introduced through the anus and                            advanced to the the cecum, identified by                            appendiceal orifice and ileocecal valve. The                            colonoscopy was performed without difficulty. The                            patient tolerated the procedure well. The quality                            of the bowel preparation was good. The ileocecal                            valve, appendiceal orifice, and rectum were                            photographed. Scope In: 2:22:33 PM Scope Out: 2:33:59 PM Scope Withdrawal Time: 0 hours 6 minutes 32 seconds  Total Procedure Duration: 0 hours 11 minutes 26 seconds  Findings:                 Large AVM in proximal ascending colon, not bleeding.                           Multiple small and large-mouthed diverticula were                            found in the left colon.  The exam was otherwise without abnormality on                            direct and retroflexion views. Complications:            No immediate complications. Estimated blood loss:                            None. Estimated Blood Loss:     Estimated blood loss: none. Impression:               - Diverticulosis in the left colon.                           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                           - The examination was otherwise normal on direct                            and retroflexion views.                           - No polyps or cancers. Recommendation:           - Patient has a contact number available for                            emergencies. The signs and symptoms of potential                            delayed complications were discussed with the                            patient. Return to normal activities tomorrow.                             Written discharge instructions were provided to the                            patient.                           - Resume previous diet.                           - Continue present medications. OK to resume your                            plavix today.                           - Please start daily OTC fiber supplement for your  constipation (such as citrucel powder) Milus Banister, MD 03/24/2019 2:38:04 PM This report has been signed electronically.

## 2019-03-26 ENCOUNTER — Telehealth: Payer: Self-pay | Admitting: *Deleted

## 2019-03-26 NOTE — Telephone Encounter (Signed)
  Follow up Call-  Call back number 03/24/2019  Post procedure Call Back phone  # [phone]  Permission to leave phone message Yes  Some recent data might be hidden    LMOM to call back with any questions or concerns.  Also, call back if patient has developed fever, respiratory issues or been dx with COVID or had any family members or close contacts diagnosed since her procedure.

## 2019-03-26 NOTE — Telephone Encounter (Signed)
  Follow up Call-  Call back number 03/24/2019  Post procedure Call Back phone  # [phone]  Permission to leave phone message Yes  Some recent data might be hidden     Patient questions:  Message left to call us if necessary.

## 2019-06-05 ENCOUNTER — Ambulatory Visit: Payer: Non-veteran care | Attending: Internal Medicine

## 2019-06-05 DIAGNOSIS — Z20822 Contact with and (suspected) exposure to covid-19: Secondary | ICD-10-CM

## 2019-06-06 LAB — NOVEL CORONAVIRUS, NAA: SARS-CoV-2, NAA: NOT DETECTED

## 2020-03-29 ENCOUNTER — Ambulatory Visit
Admission: EM | Admit: 2020-03-29 | Discharge: 2020-03-29 | Disposition: A | Discharge status: Home / Self Care | Payer: Non-veteran care | Attending: Emergency Medicine | Admitting: Emergency Medicine

## 2020-03-29 DIAGNOSIS — S61211A Laceration without foreign body of left index finger without damage to nail, initial encounter: Secondary | ICD-10-CM

## 2020-03-29 NOTE — ED Triage Notes (Signed)
Pt states cut his lt pointer finger with a box cuter over an hour ago. Bandage applied, bleeding controlled.

## 2020-03-29 NOTE — ED Provider Notes (Signed)
[facility]    CSN: [id number] Arrival date & time: 03/29/20  1317      History   Chief Complaint Chief Complaint  Patient presents with  . Laceration    HPI [name] is a 74 y.o. male  Presenting for left index finger laceration.  Cut finger accidentally with box cutter.  Reports compliance with Plavix: Apply direct pressure with adequate control bleeding.  Denies numbness, bruising.  Past Medical History:  Diagnosis Date  . Anginal pain ([place]) 07-31-13   coronary stent x1 last 12'14  . Arthritis    osteoarthritis  . Bursitis 07-31-13   heel  . Cancer ([place]) 07-31-13   skin cancer left cheek  . Cataract 2014   bilateral cataract extraction  . 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 Coronary artery disease 07-31-13   100 % blockage in left leg artery,affects left arm b/p readings  . Diabetes mellitus without complication ([place])   . Dysrhythmia 07-31-13    hx. PAC's   . GERD (gastroesophageal reflux disease)   . Heart murmur   . History of kidney stones 07-31-13   x 1  . Hyperlipidemia   . Hypertension   . Peripheral vascular disease ([place])   . Plantar fasciitis of left foot 07-31-13   left leg  . PONV (postoperative nausea and vomiting)   . Rheumatic fever    when 74 years old    Patient Active Problem List   Diagnosis Date Noted  . Spinal stenosis of lumbar region 07/22/2015  . 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 CARDIAC CATHETERIZATION     x1 stents 12'14.  . CIRCUMCISION    . FEMORAL-PERONEAL BYPASS GRAFT Right   . leg stent Right 07-31-13   right leg  . LUMBAR LAMINECTOMY/DECOMPRESSION MICRODISCECTOMY N/A 08/05/2013   Procedure: MICRO LUMBAR DECOMPRESSION L4 - L5;  Surgeon: Johnn Hai, MD;  Location: [facility];  Service: Orthopedics;  Laterality: N/A;  . LUMBAR LAMINECTOMY/DECOMPRESSION MICRODISCECTOMY N/A  07/22/2015   Procedure: REDO DECOMPRESSION LEVEL 2 L3-4 L4-5;  Surgeon: Susa Day, MD;  Location: [facility];  Service: Orthopedics;  Laterality: N/A;  . RENAL ARTERY STENT Right 07-31-13  . SHOULDER ARTHROSCOPY Left    RCTR  . stent in Right kidney    . VASECTOMY         Home Medications    Prior to Admission medications   Medication Sig Start Date End Date Taking? Authorizing Provider  acetaminophen (TYLENOL) 500 MG tablet Take 2 tablets (1,000 mg total) by mouth every 6 (six) hours as needed. 07/30/14   Charlesetta Shanks, MD  carvedilol (COREG) 3.125 MG tablet Take 3.125 mg by mouth 2 (two) times daily with a meal.    [provider]  clopidogrel (PLAVIX) 75 MG tablet Take 75 mg by mouth daily.    [provider]  famotidine (PEPCID) 20 MG tablet Take 20 mg by mouth at bedtime.    [provider]  glyBURIDE (DIABETA) 2.5 MG tablet Take 2.5 mg by mouth daily with breakfast.    [provider]  LACTOBACILLUS PROBIOTIC PO Take by mouth.    [provider]  losartan (COZAAR) 100 MG tablet Take 100 mg by mouth daily. 06/07/14   [provider]  metFORMIN (GLUCOPHAGE) 1000 MG tablet Take 1,000 mg by mouth daily.    [provider]  nitroGLYCERIN (NITROSTAT) 0.4 MG SL tablet Place 0.4 mg under the tongue every 5 (five) minutes as needed for chest pain.    [provider]  Omega-3 Fatty Acids (FISH OIL) 1200 MG CPDR Take 1,200 mg by mouth 2 (two) times daily.     [provider]  pantoprazole (PROTONIX) 40 MG tablet Take 40 mg by mouth daily.  07/19/14   [provider]  polyvinyl alcohol (LIQUIFILM TEARS) 1.4 % ophthalmic solution Place 1 drop into both eyes 2 (two) times daily as needed for dry eyes.    [provider]  pravastatin (PRAVACHOL) 40 MG tablet Take 40 mg by mouth daily.    [provider]  Vitamin D, Ergocalciferol, (DRISDOL) 50000 UNITS CAPS capsule Take 50,000 Units by mouth  every 7 (seven) days.     [provider]    Family History Family History  Problem Relation Age of Onset  . Kidney disease Mother   . Pancreatic cancer Mother   . Lung cancer Father     Social History Social History   Tobacco Use  . Smoking status: Former Smoker    Types: Cigarettes    Quit date: 08/01/2011    Years since quitting: 8.6  . Smokeless tobacco: Never Used  Substance Use Topics  . Alcohol use: No  . Drug use: No     Allergies   Contrast media [iodinated diagnostic agents] and Quinine derivatives   Review of Systems As per HPI   Physical Exam Triage Vital Signs ED Triage Vitals [03/29/20 1355]  Enc Vitals Group     BP (!) 153/75     Pulse Rate 69     Resp 18     Temp 97.9 F (36.6 C)     Temp Source Oral     SpO2 95 %     Weight      Height      Head Circumference      Peak Flow      Pain Score 0     Pain Loc      Pain Edu?      Excl. in [place]?    No data found.  Updated Vital Signs BP (!) 153/75 (BP Location: Left Arm)   Pulse 69   Temp 97.9 F (36.6 C) (Oral)   Resp 18   SpO2 95%   Visual Acuity Right Eye Distance:   Left Eye Distance:   Bilateral Distance:    Right Eye Near:   Left Eye Near:    Bilateral Near:     Physical Exam Constitutional:      General: He is not in acute distress. HENT:     Head: Normocephalic and atraumatic.  Eyes:     General: No scleral icterus.    Pupils: Pupils are equal, round, and reactive to light.  Cardiovascular:     Rate and Rhythm: Normal rate.  Pulmonary:     Effort: Pulmonary effort is normal. No respiratory distress.     Breath sounds: No wheezing.  Musculoskeletal:        General: Tenderness present. No swelling or deformity. Normal range of motion.  Skin:    General: Skin is warm.     Coloration: Skin is not jaundiced or pale.     Comments: 2 cm U-shaped partial-thickness laceration to lateral aspect of left index finger.  Neurovascular intact.  Bleeding controlled and  without contamination.  Neurological:     Mental Status: He is alert and  oriented to person, place, and time.      UC Treatments / Results  Labs (all labs ordered are listed, but only abnormal results are displayed) Labs Reviewed - No data to display  EKG   Radiology No results found.  Procedures Laceration Repair  Date/Time: 03/29/2020 3:11 PM Performed by: Quincy Sheehan, PA-C Authorized by: Quincy Sheehan, PA-C   Consent:    Consent obtained:  Verbal   Consent given by:  Patient   Risks discussed:  Infection, need for additional repair, pain, poor cosmetic result and poor wound healing   Alternatives discussed:  No treatment and delayed treatment Universal protocol:    Patient identity confirmed:  Verbally with patient Anesthesia (see MAR for exact dosages):    Anesthesia method:  Local infiltration   Local anesthetic:  Lidocaine 2% w/o epi Laceration details:    Location:  Finger   Finger location:  L index finger   Length (cm):  2   Depth (mm):  5 Repair type:    Repair type:  Simple Pre-procedure details:    Preparation:  Patient was prepped and draped in usual sterile fashion Exploration:    Hemostasis achieved with:  Direct pressure and epinephrine   Wound exploration: wound explored through full range of motion     Wound extent: no fascia violation noted, no foreign bodies/material noted and no tendon damage noted     Contaminated: no   Treatment:    Area cleansed with:  Soap and water   Amount of cleaning:  Standard   Irrigation solution:  Tap water   Irrigation method:  Tap Skin repair:    Repair method:  Sutures   Suture size:  5-0   Suture material:  Prolene   Suture technique:  Simple interrupted and horizontal mattress   Number of sutures:  7 Approximation:    Approximation:  Close Post-procedure details:    Dressing:  Non-adherent dressing   Patient tolerance of procedure:  Tolerated well, no immediate complications  Comments:     Cap refill < 2 s, ROM intact post-proc   (including critical care time)  Medications Ordered in UC Medications - No data to display  Initial Impression / Assessment and Plan / UC Course  I have reviewed the triage vital signs and the nursing notes.  Pertinent labs & imaging results that were available during my care of the patient were reviewed by me and considered in my medical decision making (see chart for details).     7 sutures placed in office: Patient tolerated well.  Will treat supportively as below, return for suture removal in 10 days.  Return precautions discussed, pt verbalized understanding and is agreeable to plan. Final Clinical Impressions(s) / UC Diagnoses   Final diagnoses:  Laceration of left index finger without foreign body without damage to nail, initial encounter     Discharge Instructions     Keep area(s) clean and dry. Apply ice for 20 minutes 3-5 times daily. Return for worsening pain, redness, swelling, discharge, fever.    ED Prescriptions    None     PDMP not reviewed this encounter.   Hall[place]Potvin, [place], [place] 03/29/20 1512

## 2020-03-29 NOTE — Discharge Instructions (Addendum)
Keep area(s) clean and dry. Apply ice for 20 minutes 3-5 times daily. Return for worsening pain, redness, swelling, discharge, fever.

## 2020-04-06 ENCOUNTER — Ambulatory Visit
Admission: EM | Admit: 2020-04-06 | Discharge: 2020-04-06 | Disposition: A | Discharge status: Home / Self Care | Payer: Non-veteran care

## 2020-04-06 ENCOUNTER — Other Ambulatory Visit: Payer: Self-pay

## 2020-04-06 ENCOUNTER — Encounter: Payer: Self-pay | Admitting: Emergency Medicine

## 2020-04-06 NOTE — ED Triage Notes (Signed)
Pt here for suture removal; seen by [facility]

## 2020-04-11 ENCOUNTER — Ambulatory Visit
Admission: EM | Admit: 2020-04-11 | Discharge: 2020-04-11 | Disposition: A | Discharge status: Home / Self Care | Payer: Non-veteran care | Attending: Emergency Medicine | Admitting: Emergency Medicine

## 2020-04-11 DIAGNOSIS — Z4802 Encounter for removal of sutures: Secondary | ICD-10-CM

## 2020-04-11 NOTE — ED Triage Notes (Signed)
Removed 7 sutures from lt index finger. No redness or drainage noted.

## 2020-08-05 ENCOUNTER — Inpatient Hospital Stay (HOSPITAL_COMMUNITY)
Admission: EM | Admit: 2020-08-05 | Discharge: 2020-08-10 | DRG: 378 | Disposition: A | Discharge status: Home / Self Care | Payer: No Typology Code available for payment source | Attending: Internal Medicine | Admitting: Internal Medicine

## 2020-08-05 ENCOUNTER — Other Ambulatory Visit: Payer: Self-pay

## 2020-08-05 ENCOUNTER — Encounter (HOSPITAL_COMMUNITY): Payer: Self-pay

## 2020-08-05 DIAGNOSIS — F0390 Unspecified dementia without behavioral disturbance: Secondary | ICD-10-CM | POA: Diagnosis present

## 2020-08-05 DIAGNOSIS — Z7982 Long term (current) use of aspirin: Secondary | ICD-10-CM

## 2020-08-05 DIAGNOSIS — Z955 Presence of coronary angioplasty implant and graft: Secondary | ICD-10-CM

## 2020-08-05 DIAGNOSIS — N1831 Chronic kidney disease, stage 3a: Secondary | ICD-10-CM | POA: Diagnosis present

## 2020-08-05 DIAGNOSIS — Z85828 Personal history of other malignant neoplasm of skin: Secondary | ICD-10-CM

## 2020-08-05 DIAGNOSIS — Z87891 Personal history of nicotine dependence: Secondary | ICD-10-CM

## 2020-08-05 DIAGNOSIS — Z20822 Contact with and (suspected) exposure to covid-19: Secondary | ICD-10-CM | POA: Diagnosis present

## 2020-08-05 DIAGNOSIS — K625 Hemorrhage of anus and rectum: Secondary | ICD-10-CM

## 2020-08-05 DIAGNOSIS — Z800 Family history of malignant neoplasm of digestive organs: Secondary | ICD-10-CM

## 2020-08-05 DIAGNOSIS — M1990 Unspecified osteoarthritis, unspecified site: Secondary | ICD-10-CM | POA: Diagnosis present

## 2020-08-05 DIAGNOSIS — K922 Gastrointestinal hemorrhage, unspecified: Secondary | ICD-10-CM | POA: Diagnosis present

## 2020-08-05 DIAGNOSIS — N179 Acute kidney failure, unspecified: Secondary | ICD-10-CM | POA: Diagnosis present

## 2020-08-05 DIAGNOSIS — N183 Chronic kidney disease, stage 3 unspecified: Secondary | ICD-10-CM

## 2020-08-05 DIAGNOSIS — I160 Hypertensive urgency: Secondary | ICD-10-CM | POA: Diagnosis present

## 2020-08-05 DIAGNOSIS — K5731 Diverticulosis of large intestine without perforation or abscess with bleeding: Principal | ICD-10-CM | POA: Diagnosis present

## 2020-08-05 DIAGNOSIS — I2510 Atherosclerotic heart disease of native coronary artery without angina pectoris: Secondary | ICD-10-CM | POA: Diagnosis present

## 2020-08-05 DIAGNOSIS — D6832 Hemorrhagic disorder due to extrinsic circulating anticoagulants: Secondary | ICD-10-CM | POA: Diagnosis present

## 2020-08-05 DIAGNOSIS — E1151 Type 2 diabetes mellitus with diabetic peripheral angiopathy without gangrene: Secondary | ICD-10-CM | POA: Diagnosis present

## 2020-08-05 DIAGNOSIS — Z801 Family history of malignant neoplasm of trachea, bronchus and lung: Secondary | ICD-10-CM

## 2020-08-05 DIAGNOSIS — D631 Anemia in chronic kidney disease: Secondary | ICD-10-CM | POA: Diagnosis present

## 2020-08-05 DIAGNOSIS — E872 Acidosis: Secondary | ICD-10-CM | POA: Diagnosis present

## 2020-08-05 DIAGNOSIS — Z7902 Long term (current) use of antithrombotics/antiplatelets: Secondary | ICD-10-CM

## 2020-08-05 DIAGNOSIS — Z7984 Long term (current) use of oral hypoglycemic drugs: Secondary | ICD-10-CM

## 2020-08-05 DIAGNOSIS — K449 Diaphragmatic hernia without obstruction or gangrene: Secondary | ICD-10-CM | POA: Diagnosis present

## 2020-08-05 DIAGNOSIS — K529 Noninfective gastroenteritis and colitis, unspecified: Secondary | ICD-10-CM | POA: Diagnosis present

## 2020-08-05 DIAGNOSIS — I70209 Unspecified atherosclerosis of native arteries of extremities, unspecified extremity: Secondary | ICD-10-CM | POA: Diagnosis present

## 2020-08-05 DIAGNOSIS — E785 Hyperlipidemia, unspecified: Secondary | ICD-10-CM | POA: Diagnosis present

## 2020-08-05 DIAGNOSIS — E663 Overweight: Secondary | ICD-10-CM | POA: Diagnosis present

## 2020-08-05 DIAGNOSIS — Z6827 Body mass index (BMI) 27.0-27.9, adult: Secondary | ICD-10-CM

## 2020-08-05 DIAGNOSIS — Z79899 Other long term (current) drug therapy: Secondary | ICD-10-CM

## 2020-08-05 DIAGNOSIS — R159 Full incontinence of feces: Secondary | ICD-10-CM | POA: Diagnosis present

## 2020-08-05 DIAGNOSIS — K219 Gastro-esophageal reflux disease without esophagitis: Secondary | ICD-10-CM | POA: Diagnosis present

## 2020-08-05 DIAGNOSIS — Z888 Allergy status to other drugs, medicaments and biological substances status: Secondary | ICD-10-CM

## 2020-08-05 DIAGNOSIS — I129 Hypertensive chronic kidney disease with stage 1 through stage 4 chronic kidney disease, or unspecified chronic kidney disease: Secondary | ICD-10-CM | POA: Diagnosis present

## 2020-08-05 DIAGNOSIS — E1122 Type 2 diabetes mellitus with diabetic chronic kidney disease: Secondary | ICD-10-CM | POA: Diagnosis present

## 2020-08-05 DIAGNOSIS — K5521 Angiodysplasia of colon with hemorrhage: Secondary | ICD-10-CM | POA: Diagnosis present

## 2020-08-05 DIAGNOSIS — Z841 Family history of disorders of kidney and ureter: Secondary | ICD-10-CM

## 2020-08-05 DIAGNOSIS — Z95828 Presence of other vascular implants and grafts: Secondary | ICD-10-CM

## 2020-08-05 DIAGNOSIS — Z7901 Long term (current) use of anticoagulants: Secondary | ICD-10-CM

## 2020-08-05 DIAGNOSIS — Z91041 Radiographic dye allergy status: Secondary | ICD-10-CM

## 2020-08-05 DIAGNOSIS — E1165 Type 2 diabetes mellitus with hyperglycemia: Secondary | ICD-10-CM | POA: Diagnosis present

## 2020-08-05 DIAGNOSIS — K648 Other hemorrhoids: Secondary | ICD-10-CM | POA: Diagnosis present

## 2020-08-05 LAB — POC OCCULT BLOOD, ED: Fecal Occult Bld: NEGATIVE

## 2020-08-05 NOTE — ED Provider Notes (Signed)
[facility] DEPT Provider Note: Georgena Spurling, MD, FACEP  CSN: [id number] MRN: [id number] ARRIVAL: 08/05/20 at 2240 ROOM: [place]  Rectal Bleeding   HISTORY OF PRESENT ILLNESS  08/05/20 11:05 PM [name] is a 75 y.o. male who has had several months of chronic diarrhea.  He had a CT scan of the abdomen pelvis at the [place] several days ago which showed cholelithiasis and diverticulosis without diverticulitis.  He has hemorrhoids and usually has a small amount of bleeding (toilet tissue) with bowel movements.  About 90 minutes ago he had a bowel movement which consisted of gross blood mixed with stool.  He states the amount was fairly significant and was much more prominent than his usual hemorrhoidal bleeding.  He is also having some mild left lower quadrant pain with this.  He denies any nausea, vomiting, shortness of breath, chest pain or lightheadedness.  Nothing makes the symptoms better or worse.   Past Medical History:  Diagnosis Date  . Anginal pain ([place]) 07-31-13   coronary stent x1 last 12'14  . Arthritis    osteoarthritis  . Bursitis 07-31-13   heel  . Cancer ([place]) 07-31-13   skin cancer left cheek  . Cataract 2014   bilateral cataract extraction  . 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 Coronary artery disease 07-31-13   100 % blockage in left leg artery,affects left arm b/p readings  . Diabetes mellitus without complication ([place])   . Dysrhythmia 07-31-13    hx. PAC's   . GERD (gastroesophageal reflux disease)   . Heart murmur   . History of kidney stones 07-31-13   x 1  . Hyperlipidemia   . Hypertension   . Peripheral vascular disease ([place])   . Plantar fasciitis of left foot 07-31-13   left leg  . PONV (postoperative nausea and vomiting)   . Rheumatic fever    when 75 years old    Past Surgical History:  Procedure Laterality Date  . CARDIAC CATHETERIZATION     x1 stents 12'14.  .  CIRCUMCISION    . FEMORAL-PERONEAL BYPASS GRAFT Right   . leg stent Right 07-31-13   right leg  . LUMBAR LAMINECTOMY/DECOMPRESSION MICRODISCECTOMY N/A 08/05/2013   Procedure: MICRO LUMBAR DECOMPRESSION L4 - L5;  Surgeon: Johnn Hai, MD;  Location: [facility];  Service: Orthopedics;  Laterality: N/A;  . LUMBAR LAMINECTOMY/DECOMPRESSION MICRODISCECTOMY N/A 07/22/2015   Procedure: REDO DECOMPRESSION LEVEL 2 L3-4 L4-5;  Surgeon: Susa Day, MD;  Location: [facility];  Service: Orthopedics;  Laterality: N/A;  . RENAL ARTERY STENT Right 07-31-13  . SHOULDER ARTHROSCOPY Left    RCTR  . stent in Right kidney    . VASECTOMY      Family History  Problem Relation Age of Onset  . Kidney disease Mother   . Pancreatic cancer Mother   . Lung cancer Father     Social History   Tobacco Use  . Smoking status: Former Smoker    Types: Cigarettes    Quit date: 08/01/2011    Years since quitting: 9.0  . Smokeless tobacco: Never Used  Substance Use Topics  . Alcohol use: No  . Drug use: No    Prior to Admission medications   Medication Sig Start Date End Date Taking? Authorizing Provider  acetaminophen (TYLENOL) 500 MG tablet Take 2 tablets (1,000 mg total) by mouth every 6 (six) hours as needed. Patient taking  differently: Take 1,000 mg by mouth every 6 (six) hours as needed for mild pain. 07/30/14  Yes Charlesetta Shanks, MD  aspirin 81 MG chewable tablet Chew 81 mg by mouth daily.   Yes [provider]  carvedilol (COREG) 3.125 MG tablet Take 3.125 mg by mouth 2 (two) times daily with a meal.   Yes [provider]  cetirizine (ZYRTEC) 10 MG tablet Take 10 mg by mouth daily.   Yes [provider]  cholecalciferol (VITAMIN D3) 25 MCG (1000 UNIT) tablet Take 2,000 Units by mouth See admin instructions. Every other week   Yes [provider]  clopidogrel (PLAVIX) 75 MG tablet Take 75 mg by mouth daily.   Yes [provider]  donepezil (ARICEPT) 5 MG tablet Take  10 mg by mouth at bedtime. 05/03/20  Yes [provider]  DULoxetine (CYMBALTA) 20 MG capsule Take 20 mg by mouth daily.   Yes [provider]  famotidine (PEPCID) 20 MG tablet Take 20 mg by mouth at bedtime.   Yes [provider]  glyBURIDE (DIABETA) 2.5 MG tablet Take 2.5 mg by mouth daily with breakfast.   Yes [provider]  LACTOBACILLUS PROBIOTIC PO Take by mouth.   Yes [provider]  losartan (COZAAR) 100 MG tablet Take 100 mg by mouth daily. 06/07/14  Yes [provider]  metFORMIN (GLUCOPHAGE) 1000 MG tablet Take 1,000 mg by mouth daily.   Yes [provider]  nitroGLYCERIN (NITROSTAT) 0.4 MG SL tablet Place 0.4 mg under the tongue every 5 (five) minutes as needed for chest pain.   Yes [provider]  Omega-3 Fatty Acids (FISH OIL) 1200 MG CPDR Take 1,200 mg by mouth daily.   Yes [provider]  pantoprazole (PROTONIX) 40 MG tablet Take 40 mg by mouth daily.  07/19/14  Yes [provider]  polyvinyl alcohol (LIQUIFILM TEARS) 1.4 % ophthalmic solution Place 1 drop into both eyes 2 (two) times daily as needed for dry eyes.   Yes [provider]  pravastatin (PRAVACHOL) 40 MG tablet Take 40 mg by mouth daily.   Yes [provider]    Allergies Contrast media [iodinated diagnostic agents] and Quinine derivatives   REVIEW OF SYSTEMS  Negative except as noted here or in the History of Present Illness.   PHYSICAL EXAMINATION  Initial Vital Signs Blood pressure (!) 190/77, pulse 70, temperature 98.1 F (36.7 C), temperature source Oral, resp. rate 18, height 5' 9.5" (1.765 m), weight 85.1 kg, SpO2 99 %.  Examination General: Well-developed, well-nourished male in no acute distress; appearance consistent with age of record HENT: normocephalic; atraumatic Eyes: pupils equal, round and reactive to light; extraocular muscles intact Neck: supple Heart: regular rate and  rhythm Lungs: clear to auscultation bilaterally Abdomen: soft; nondistended; mild left lower quadrant tenderness; bowel sounds present Rectal: Normal sphincter tone; external hemorrhoids without obvious bleeding; no blood or stool in vault; Hemoccult negative Extremities: No deformity; full range of motion; pulses normal Neurologic: Awake, alert and oriented; motor function intact in all extremities and symmetric; no facial droop Skin: Warm and dry Psychiatric: Normal mood and affect   RESULTS  Summary of this visit's results, reviewed and interpreted by myself:   EKG Interpretation  Date/Time:    Ventricular Rate:    PR Interval:    QRS Duration:   QT Interval:    QTC Calculation:   R Axis:     Text Interpretation:        Laboratory  Studies: Results for orders placed or performed during the hospital encounter of 08/05/20 (from the past 24 hour(s))  CBC with Differential/Platelet     Status: Abnormal   Collection Time: 08/05/20 11:20 PM  Result Value Ref Range   WBC 5.6 4.0 - 10.5 K/uL   RBC 3.97 (L) 4.22 - 5.81 MIL/uL   Hemoglobin 12.4 (L) 13.0 - 17.0 g/dL   HCT 36.6 (L) 39.0 - 52.0 %   MCV 92.2 80.0 - 100.0 fL   MCH 31.2 26.0 - 34.0 pg   MCHC 33.9 30.0 - 36.0 g/dL   RDW 12.9 11.5 - 15.5 %   Platelets 215 150 - 400 K/uL   nRBC 0.0 0.0 - 0.2 %   Neutrophils Relative % 49 %   Neutro Abs 2.8 1.7 - 7.7 K/uL   Lymphocytes Relative 32 %   Lymphs Abs 1.8 0.7 - 4.0 K/uL   Monocytes Relative 12 %   Monocytes Absolute 0.7 0.1 - 1.0 K/uL   Eosinophils Relative 6 %   Eosinophils Absolute 0.3 0.0 - 0.5 K/uL   Basophils Relative 1 %   Basophils Absolute 0.1 0.0 - 0.1 K/uL   Immature Granulocytes 0 %   Abs Immature Granulocytes 0.02 0.00 - 0.07 K/uL  Basic metabolic panel     Status: Abnormal   Collection Time: 08/05/20 11:20 PM  Result Value Ref Range   Sodium 138 135 - 145 mmol/L   Potassium 4.2 3.5 - 5.1 mmol/L   Chloride 106 98 - 111 mmol/L   CO2 23 22 - 32 mmol/L    Glucose, Bld 146 (H) 70 - 99 mg/dL   BUN 24 (H) 8 - 23 mg/dL   Creatinine, Ser 1.70 (H) 0.61 - 1.24 mg/dL   Calcium 9.3 8.9 - 10.3 mg/dL   GFR, Estimated 41 (L) >60 mL/min   Anion gap 9 5 - 15  POC occult blood, ED     Status: None   Collection Time: 08/05/20 11:23 PM  Result Value Ref Range   Fecal Occult Bld NEGATIVE NEGATIVE   Imaging Studies: No results found.  ED COURSE and MDM  Nursing notes, initial and subsequent vitals signs, including pulse oximetry, reviewed and interpreted by myself.  Vitals:   08/05/20 2249 08/05/20 2253  BP: (!) 190/77   Pulse: 70   Resp: 18   Temp: 98.1 F (36.7 C)   TempSrc: Oral   SpO2: 99%   Weight:  85.1 kg  Height:  5' 9.5" (1.765 m)   Medications - No data to display  Suspect diverticular bleed given symptoms (bleeding, left lower quadrant pain) and recent CT scan demonstrating diverticulosis. Dr. Sidney Ace to admit to hospitalist service.  PROCEDURES  Procedures   ED DIAGNOSES     ICD-10-CM   1. Lower GI bleed  K92.2        Moorea Boissonneault, MD 08/06/20 [phone]

## 2020-08-05 NOTE — ED Triage Notes (Signed)
Pt c/o rectal bleeding starting today. Reports diarrhea x 2-9mo and had [facility] 3/[facility]. Takes ASA daily

## 2020-08-06 ENCOUNTER — Inpatient Hospital Stay (HOSPITAL_COMMUNITY): Payer: No Typology Code available for payment source

## 2020-08-06 DIAGNOSIS — Z6827 Body mass index (BMI) 27.0-27.9, adult: Secondary | ICD-10-CM | POA: Diagnosis not present

## 2020-08-06 DIAGNOSIS — N179 Acute kidney failure, unspecified: Secondary | ICD-10-CM | POA: Diagnosis present

## 2020-08-06 DIAGNOSIS — I160 Hypertensive urgency: Secondary | ICD-10-CM

## 2020-08-06 DIAGNOSIS — K219 Gastro-esophageal reflux disease without esophagitis: Secondary | ICD-10-CM | POA: Diagnosis present

## 2020-08-06 DIAGNOSIS — Z7902 Long term (current) use of antithrombotics/antiplatelets: Secondary | ICD-10-CM

## 2020-08-06 DIAGNOSIS — K921 Melena: Secondary | ICD-10-CM | POA: Diagnosis not present

## 2020-08-06 DIAGNOSIS — I2510 Atherosclerotic heart disease of native coronary artery without angina pectoris: Secondary | ICD-10-CM | POA: Diagnosis present

## 2020-08-06 DIAGNOSIS — K5520 Angiodysplasia of colon without hemorrhage: Secondary | ICD-10-CM | POA: Diagnosis not present

## 2020-08-06 DIAGNOSIS — N189 Chronic kidney disease, unspecified: Secondary | ICD-10-CM

## 2020-08-06 DIAGNOSIS — E1122 Type 2 diabetes mellitus with diabetic chronic kidney disease: Secondary | ICD-10-CM | POA: Diagnosis present

## 2020-08-06 DIAGNOSIS — K5731 Diverticulosis of large intestine without perforation or abscess with bleeding: Secondary | ICD-10-CM | POA: Diagnosis present

## 2020-08-06 DIAGNOSIS — K922 Gastrointestinal hemorrhage, unspecified: Secondary | ICD-10-CM

## 2020-08-06 DIAGNOSIS — R10813 Right lower quadrant abdominal tenderness: Secondary | ICD-10-CM

## 2020-08-06 DIAGNOSIS — F0390 Unspecified dementia without behavioral disturbance: Secondary | ICD-10-CM | POA: Diagnosis not present

## 2020-08-06 DIAGNOSIS — K5521 Angiodysplasia of colon with hemorrhage: Secondary | ICD-10-CM | POA: Diagnosis present

## 2020-08-06 DIAGNOSIS — Z888 Allergy status to other drugs, medicaments and biological substances status: Secondary | ICD-10-CM | POA: Diagnosis not present

## 2020-08-06 DIAGNOSIS — K529 Noninfective gastroenteritis and colitis, unspecified: Secondary | ICD-10-CM | POA: Diagnosis present

## 2020-08-06 DIAGNOSIS — E785 Hyperlipidemia, unspecified: Secondary | ICD-10-CM | POA: Diagnosis present

## 2020-08-06 DIAGNOSIS — R197 Diarrhea, unspecified: Secondary | ICD-10-CM | POA: Diagnosis not present

## 2020-08-06 DIAGNOSIS — N1831 Chronic kidney disease, stage 3a: Secondary | ICD-10-CM | POA: Diagnosis present

## 2020-08-06 DIAGNOSIS — D6832 Hemorrhagic disorder due to extrinsic circulating anticoagulants: Secondary | ICD-10-CM | POA: Diagnosis present

## 2020-08-06 DIAGNOSIS — E119 Type 2 diabetes mellitus without complications: Secondary | ICD-10-CM

## 2020-08-06 DIAGNOSIS — Z7901 Long term (current) use of anticoagulants: Secondary | ICD-10-CM | POA: Diagnosis not present

## 2020-08-06 DIAGNOSIS — A080 Rotaviral enteritis: Secondary | ICD-10-CM | POA: Diagnosis not present

## 2020-08-06 DIAGNOSIS — I739 Peripheral vascular disease, unspecified: Secondary | ICD-10-CM

## 2020-08-06 DIAGNOSIS — E872 Acidosis: Secondary | ICD-10-CM | POA: Diagnosis present

## 2020-08-06 DIAGNOSIS — K625 Hemorrhage of anus and rectum: Secondary | ICD-10-CM | POA: Diagnosis not present

## 2020-08-06 DIAGNOSIS — K5790 Diverticulosis of intestine, part unspecified, without perforation or abscess without bleeding: Secondary | ICD-10-CM | POA: Diagnosis not present

## 2020-08-06 DIAGNOSIS — E1151 Type 2 diabetes mellitus with diabetic peripheral angiopathy without gangrene: Secondary | ICD-10-CM | POA: Diagnosis present

## 2020-08-06 DIAGNOSIS — E663 Overweight: Secondary | ICD-10-CM | POA: Diagnosis present

## 2020-08-06 DIAGNOSIS — E1165 Type 2 diabetes mellitus with hyperglycemia: Secondary | ICD-10-CM | POA: Diagnosis present

## 2020-08-06 DIAGNOSIS — Z91041 Radiographic dye allergy status: Secondary | ICD-10-CM | POA: Diagnosis not present

## 2020-08-06 DIAGNOSIS — Z20822 Contact with and (suspected) exposure to covid-19: Secondary | ICD-10-CM | POA: Diagnosis present

## 2020-08-06 DIAGNOSIS — I70209 Unspecified atherosclerosis of native arteries of extremities, unspecified extremity: Secondary | ICD-10-CM | POA: Diagnosis present

## 2020-08-06 DIAGNOSIS — Z95828 Presence of other vascular implants and grafts: Secondary | ICD-10-CM | POA: Diagnosis not present

## 2020-08-06 LAB — BASIC METABOLIC PANEL
Anion gap: 9 (ref 5–15)
Anion gap: 10 (ref 5–15)
BUN: 24 mg/dL — ABNORMAL HIGH (ref 8–23)
BUN: 22 mg/dL (ref 8–23)
CO2: 23 mmol/L (ref 22–32)
CO2: 23 mmol/L (ref 22–32)
Calcium: 9.3 mg/dL (ref 8.9–10.3)
Calcium: 9.1 mg/dL (ref 8.9–10.3)
Chloride: 106 mmol/L (ref 98–111)
Chloride: 107 mmol/L (ref 98–111)
Creatinine, Ser: 1.7 mg/dL — ABNORMAL HIGH (ref 0.61–1.24)
Creatinine, Ser: 1.56 mg/dL — ABNORMAL HIGH (ref 0.61–1.24)
GFR, Estimated: 41 mL/min — ABNORMAL LOW (ref >60)
GFR, Estimated: 45 mL/min — ABNORMAL LOW (ref >60)
Glucose, Bld: 146 mg/dL — ABNORMAL HIGH (ref 70–99)
Glucose, Bld: 144 mg/dL — ABNORMAL HIGH (ref 70–99)
Potassium: 4.2 mmol/L (ref 3.5–5.1)
Potassium: 3.6 mmol/L (ref 3.5–5.1)
Sodium: 138 mmol/L (ref 135–145)
Sodium: 140 mmol/L (ref 135–145)

## 2020-08-06 LAB — CBC WITH DIFFERENTIAL/PLATELET
Abs Immature Granulocytes: 0.02 10*3/uL (ref 0.00–0.07)
Basophils Absolute: 0.1 10*3/uL (ref 0.0–0.1)
Basophils Relative: 1 %
Eosinophils Absolute: 0.3 10*3/uL (ref 0.0–0.5)
Eosinophils Relative: 6 %
HCT: 36.6 % — ABNORMAL LOW (ref 39.0–52.0)
Hemoglobin: 12.4 g/dL — ABNORMAL LOW (ref 13.0–17.0)
Immature Granulocytes: 0 %
Lymphocytes Relative: 32 %
Lymphs Abs: 1.8 10*3/uL (ref 0.7–4.0)
MCH: 31.2 pg (ref 26.0–34.0)
MCHC: 33.9 g/dL (ref 30.0–36.0)
MCV: 92.2 fL (ref 80.0–100.0)
Monocytes Absolute: 0.7 10*3/uL (ref 0.1–1.0)
Monocytes Relative: 12 %
Neutro Abs: 2.8 10*3/uL (ref 1.7–7.7)
Neutrophils Relative %: 49 %
Platelets: 215 10*3/uL (ref 150–400)
RBC: 3.97 MIL/uL — ABNORMAL LOW (ref 4.22–5.81)
RDW: 12.9 % (ref 11.5–15.5)
WBC: 5.6 10*3/uL (ref 4.0–10.5)
nRBC: 0 % (ref 0.0–0.2)

## 2020-08-06 LAB — CBC
HCT: 35.6 % — ABNORMAL LOW (ref 39.0–52.0)
HCT: 39.3 % (ref 39.0–52.0)
Hemoglobin: 12.1 g/dL — ABNORMAL LOW (ref 13.0–17.0)
Hemoglobin: 13.2 g/dL (ref 13.0–17.0)
MCH: 31.5 pg (ref 26.0–34.0)
MCH: 31.3 pg (ref 26.0–34.0)
MCHC: 34 g/dL (ref 30.0–36.0)
MCHC: 33.6 g/dL (ref 30.0–36.0)
MCV: 92.7 fL (ref 80.0–100.0)
MCV: 93.1 fL (ref 80.0–100.0)
Platelets: 200 10*3/uL (ref 150–400)
Platelets: 249 10*3/uL (ref 150–400)
RBC: 3.84 MIL/uL — ABNORMAL LOW (ref 4.22–5.81)
RBC: 4.22 MIL/uL (ref 4.22–5.81)
RDW: 13 % (ref 11.5–15.5)
RDW: 13 % (ref 11.5–15.5)
WBC: 5.9 10*3/uL (ref 4.0–10.5)
WBC: 5.6 10*3/uL (ref 4.0–10.5)
nRBC: 0 % (ref 0.0–0.2)
nRBC: 0 % (ref 0.0–0.2)

## 2020-08-06 LAB — SARS CORONAVIRUS 2 (TAT 6-24 HRS): SARS Coronavirus 2: NEGATIVE

## 2020-08-06 LAB — CBG MONITORING, ED
Glucose-Capillary: 159 mg/dL — ABNORMAL HIGH (ref 70–99)
Glucose-Capillary: 136 mg/dL — ABNORMAL HIGH (ref 70–99)

## 2020-08-06 LAB — GLUCOSE, CAPILLARY
Glucose-Capillary: 197 mg/dL — ABNORMAL HIGH (ref 70–99)
Glucose-Capillary: 179 mg/dL — ABNORMAL HIGH (ref 70–99)

## 2020-08-06 LAB — HEMOGLOBIN A1C
Hgb A1c MFr Bld: 7 % — ABNORMAL HIGH (ref 4.8–5.6)
Mean Plasma Glucose: 154.2 mg/dL

## 2020-08-06 LAB — HEMOGLOBIN AND HEMATOCRIT, BLOOD
HCT: 35.4 % — ABNORMAL LOW (ref 39.0–52.0)
Hemoglobin: 12 g/dL — ABNORMAL LOW (ref 13.0–17.0)

## 2020-08-06 MED ORDER — ONDANSETRON HCL 4 MG/2ML IJ SOLN: 4.0000 mg | Freq: Four times a day (QID) | INTRAMUSCULAR | Status: DC | PRN

## 2020-08-06 MED ORDER — PANTOPRAZOLE SODIUM 40 MG PO TBEC
40.0000 mg | DELAYED_RELEASE_TABLET | Freq: Every day | ORAL | Status: DC
  Administered 2020-08-06 – 2020-08-10 (×5): 40 mg via ORAL
  Filled 2020-08-06 (×5): qty 1

## 2020-08-06 MED ORDER — DONEPEZIL HCL 10 MG PO TABS
10.0000 mg | ORAL_TABLET | Freq: Every day | ORAL | Status: DC
  Administered 2020-08-06 – 2020-08-09 (×4): 10 mg via ORAL
  Filled 2020-08-06 (×4): qty 1

## 2020-08-06 MED ORDER — ACETAMINOPHEN 650 MG RE SUPP: 650.0000 mg | Freq: Four times a day (QID) | RECTAL | Status: DC | PRN

## 2020-08-06 MED ORDER — TRAZODONE HCL 50 MG PO TABS
25.0000 mg | ORAL_TABLET | Freq: Every evening | ORAL | Status: DC | PRN
  Filled 2020-08-06: qty 1

## 2020-08-06 MED ORDER — ACETAMINOPHEN 325 MG PO TABS: 650.0000 mg | ORAL_TABLET | Freq: Four times a day (QID) | ORAL | Status: DC | PRN

## 2020-08-06 MED ORDER — VITAMIN D 25 MCG (1000 UNIT) PO TABS
2000.0000 [IU] | ORAL_TABLET | ORAL | Status: DC
  Administered 2020-08-08: 2000 [IU] via ORAL

## 2020-08-06 MED ORDER — FISH OIL 1200 MG PO CPDR: 1200.0000 mg | DELAYED_RELEASE_CAPSULE | Freq: Every day | ORAL | Status: DC

## 2020-08-06 MED ORDER — ONDANSETRON HCL 4 MG PO TABS: 4.0000 mg | ORAL_TABLET | Freq: Four times a day (QID) | ORAL | Status: DC | PRN

## 2020-08-06 MED ORDER — OMEGA-3-ACID ETHYL ESTERS 1 G PO CAPS
1.0000 g | ORAL_CAPSULE | Freq: Every day | ORAL | Status: DC
  Administered 2020-08-06 – 2020-08-10 (×5): 1 g via ORAL
  Filled 2020-08-06 (×5): qty 1

## 2020-08-06 MED ORDER — DULOXETINE HCL 20 MG PO CPEP
20.0000 mg | ORAL_CAPSULE | Freq: Every day | ORAL | Status: DC
  Administered 2020-08-06 – 2020-08-10 (×5): 20 mg via ORAL
  Filled 2020-08-06 (×5): qty 1

## 2020-08-06 MED ORDER — LOSARTAN POTASSIUM 50 MG PO TABS
100.0000 mg | ORAL_TABLET | Freq: Every day | ORAL | Status: DC
  Administered 2020-08-06 – 2020-08-07 (×2): 100 mg via ORAL
  Filled 2020-08-06: qty 4
  Filled 2020-08-06: qty 2

## 2020-08-06 MED ORDER — INSULIN ASPART 100 UNIT/ML ~~LOC~~ SOLN
0.0000 [IU] | Freq: Three times a day (TID) | SUBCUTANEOUS | Status: DC
  Administered 2020-08-06: 1 [IU] via SUBCUTANEOUS
  Administered 2020-08-06: 2 [IU] via SUBCUTANEOUS
  Administered 2020-08-06: 1 [IU] via SUBCUTANEOUS
  Administered 2020-08-06: 2 [IU] via SUBCUTANEOUS
  Administered 2020-08-07: 1 [IU] via SUBCUTANEOUS
  Administered 2020-08-07: 3 [IU] via SUBCUTANEOUS
  Filled 2020-08-06: qty 0.09

## 2020-08-06 MED ORDER — PRAVASTATIN SODIUM 20 MG PO TABS
40.0000 mg | ORAL_TABLET | Freq: Every day | ORAL | Status: DC
  Administered 2020-08-06 – 2020-08-10 (×5): 40 mg via ORAL
  Filled 2020-08-06 (×5): qty 2

## 2020-08-06 MED ORDER — LORATADINE 10 MG PO TABS
10.0000 mg | ORAL_TABLET | Freq: Every day | ORAL | Status: DC
  Administered 2020-08-06 – 2020-08-10 (×5): 10 mg via ORAL
  Filled 2020-08-06 (×5): qty 1

## 2020-08-06 MED ORDER — SODIUM CHLORIDE 0.9 % IV SOLN: INTRAVENOUS | Status: DC

## 2020-08-06 MED ORDER — CARVEDILOL 3.125 MG PO TABS
3.1250 mg | ORAL_TABLET | Freq: Two times a day (BID) | ORAL | Status: DC
  Administered 2020-08-06 – 2020-08-07 (×3): 3.125 mg via ORAL
  Filled 2020-08-06 (×3): qty 1

## 2020-08-06 MED ORDER — POLYVINYL ALCOHOL 1.4 % OP SOLN
1.0000 [drp] | Freq: Two times a day (BID) | OPHTHALMIC | Status: DC | PRN
  Filled 2020-08-06: qty 15

## 2020-08-06 MED ORDER — FAMOTIDINE 20 MG PO TABS
20.0000 mg | ORAL_TABLET | Freq: Every day | ORAL | Status: DC
  Administered 2020-08-06 – 2020-08-09 (×4): 20 mg via ORAL
  Filled 2020-08-06 (×4): qty 1

## 2020-08-06 MED ORDER — GLYBURIDE 2.5 MG PO TABS
2.5000 mg | ORAL_TABLET | Freq: Every day | ORAL | Status: DC
  Filled 2020-08-06: qty 1

## 2020-08-06 MED ORDER — NITROGLYCERIN 0.4 MG SL SUBL: 0.4000 mg | SUBLINGUAL_TABLET | SUBLINGUAL | Status: DC | PRN

## 2020-08-06 MED ORDER — HYDRALAZINE HCL 20 MG/ML IJ SOLN
10.0000 mg | Freq: Four times a day (QID) | INTRAMUSCULAR | Status: DC | PRN
  Administered 2020-08-06 – 2020-08-07 (×2): 10 mg via INTRAVENOUS
  Filled 2020-08-06 (×2): qty 1

## 2020-08-06 NOTE — Progress Notes (Signed)
PROGRESS NOTE  [name] XVQ:[id number] DOB: 04-Jan-1946   PCP: System, Provider Not In  Patient is from: Home  DOA: 08/05/2020 LOS: 0  Chief complaints: Rectal bleed  Brief Narrative / Interim history: 75 year old male with PMH of CAD and PAD s/p multiple stents in 2014 still on DAPT, CKD-3A, dementia?,  DM-2, HTN and diverticulosis presenting with an episode of hematochezia about 9:45 PM on 08/05/2020.  Has not further rectal bleed.  He also reports 3-4 loose stools a day for about a month.  He was admitted for what looks like a diverticular bleed.  CT abdomen and pelvis positive for cholelithiasis and diverticulosis without cholecystitis or diverticulitis.  Surprisingly Hemoccult was negative.  Hgb 12.4> 12.0.  GI consulted.  Subjective: Seen and examined earlier this morning.  No major events overnight of this morning.  He denies further GI bleed but reports some mucus in stool this morning.  Denies abdominal pain, nausea, vomiting, chest pain, dyspnea or UTI symptoms.  Objective: Vitals:   08/06/20 0415 08/06/20 0600 08/06/20 0930 08/06/20 1200  BP: (!) 153/70 (!) 167/72 133/71 (!) 167/79  Pulse: (!) 58 (!) 54 (!) 52 (!) 50  Resp: 13 11 13 14   Temp:      TempSrc:      SpO2: 97% 98% 96% 98%  Weight:      Height:       No intake or output data in the 24 hours ending 08/06/20 1412 Filed Weights   08/05/20 2253  Weight: 85.1 kg    Examination:  GENERAL: No apparent distress.  Nontoxic. HEENT: MMM.  Vision and hearing grossly intact.  NECK: Supple.  No apparent JVD.  RESP:  No IWOB.  Fair aeration bilaterally. CVS:  RRR. Heart sounds normal.  ABD/GI/GU: BS+. Abd soft.  RLQ tenderness.  No rebound. MSK/EXT:  Moves extremities. No apparent deformity. No edema.  SKIN: no apparent skin lesion or wound NEURO: Awake, alert and oriented appropriately.  No apparent focal neuro deficit. PSYCH: Calm. Normal affect.   Procedures:  None yet.  Microbiology  summarized: COVID-19 PCR pending. GI panel pending. Stool ova and cyst pending.  Assessment & Plan: Hematochezia-diverticular bleed?  Had one episode about 9:45 PM the night of 3/18.  No further GI bleed but mucus in stool.  Has RLQ tenderness without rebound or guarding.  CT with cholelithiasis and diverticulosis without cholecystitis, diverticulitis or appendicitis.  No leukocytosis or fever.  Hemoccult negative x2.  He is on DAPT for cardiac and extremity stents.  H&H seems to be stable. Recent Labs    08/05/20 2320 08/06/20 0419 08/06/20 0900  HGB 12.4* 12.1* 12.0*  -GI following  -Monitor H&H -Continue holding DAPT -SCD for VTE prophylaxis  Subacute diarrhea-reports about 3-4 loose BMs daily over the last 3 months.  Also reports mucus in the stool.  No history of cholecystectomy.  Has RLQ tenderness on exam.  CT findings as above. -Stool O/C -Follow GI panel -Follow fecal calprotectin -Consider Imodium if GI panel negative and no further rectal bleed  AKI on CKD-3A: Improving.  He is on losartan at home. Recent Labs    08/05/20 2320 08/06/20 0419  BUN 24* 22  CREATININE 1.70* 1.56*  -Continue IV fluid -Avoid nephrotoxic meds.  Hold losartan.  Hypertensive urgency: Improved. -Continue Coreg with as needed hydralazine -Continue holding losartan in the setting of AKI  Controlled NIDDM-2 with macrovascular complication, hyperlipidemia and hyperglycemia: A1c 7.0%.  On glyburide and Metformin at home. Recent Labs  Lab  08/06/20 0808 08/06/20 1205  GLUCAP 159* 136*  -Discontinue glyburide while in-house -Continue SSI -Continue statin -Could benefit from newer agents with cardiovascular benefits  History of CAD/PVD s/p multiple stents in 2014.  On Plavix, aspirin and a statin at home.  No anginal symptoms. -Aspirin and Plavix on hold in the setting of GI bleed -Continue statin and Coreg.  GERD -Continue PPI  Dementia?  She is oriented appropriately.  Seems to be  on Aricept at home. -Continue home Aricept  Overweight Body mass index is 27.31 kg/m.         DVT prophylaxis:  SCDs Start: 08/06/20 0232  Code Status: Full code Family Communication: Updated patient's wife at bedside. Level of care: Telemetry Status is: Inpatient  Remains inpatient appropriate because:Ongoing diagnostic testing needed not appropriate for outpatient work up, Unsafe d/c plan, IV treatments appropriate due to intensity of illness or inability to take PO and Inpatient level of care appropriate due to severity of illness   Dispo: The patient is from: Home              Anticipated d/c is to: Home              Patient currently is not medically stable to d/c.   Difficult to place patient No       Consultants:  Gastroenterology   Sch Meds:  Scheduled Meds: . carvedilol  3.125 mg Oral BID WC  . [START ON 08/08/2020] cholecalciferol  2,000 Units Oral See admin instructions  . donepezil  10 mg Oral QHS  . DULoxetine  20 mg Oral Daily  . famotidine  20 mg Oral QHS  . insulin aspart  0-9 Units Subcutaneous TID WC & HS  . loratadine  10 mg Oral Daily  . losartan  100 mg Oral Daily  . omega-3 acid ethyl esters  1 g Oral Daily  . pantoprazole  40 mg Oral Daily  . pravastatin  40 mg Oral Daily   Continuous Infusions: . sodium chloride    . sodium chloride 100 mL/hr at 08/06/20 0409   PRN Meds:.acetaminophen **OR** acetaminophen, hydrALAZINE, nitroGLYCERIN, ondansetron **OR** ondansetron (ZOFRAN) IV, polyvinyl alcohol, traZODone  Antimicrobials: Anti-infectives (From admission, onward)   None       I have personally reviewed the following labs and images: CBC: Recent Labs  Lab 08/05/20 2320 08/06/20 0419 08/06/20 0900  WBC 5.6 5.9  --   NEUTROABS 2.8  --   --   HGB 12.4* 12.1* 12.0*  HCT 36.6* 35.6* 35.4*  MCV 92.2 92.7  --   PLT 215 200  --    BMP &GFR Recent Labs  Lab 08/05/20 2320 08/06/20 0419  NA 138 140  K 4.2 3.6  CL 106 107   CO2 23 23  GLUCOSE 146* 144*  BUN 24* 22  CREATININE 1.70* 1.56*  CALCIUM 9.3 9.1   Estimated Creatinine Clearance: 40.3 mL/min (A) (by C-G formula based on SCr of 1.56 mg/dL (H)). Liver & Pancreas: No results for input(s): AST, ALT, ALKPHOS, BILITOT, PROT, ALBUMIN in the last 168 hours. No results for input(s): LIPASE, AMYLASE in the last 168 hours. No results for input(s): AMMONIA in the last 168 hours. Diabetic: Recent Labs    08/06/20 0419  HGBA1C 7.0*   Recent Labs  Lab 08/06/20 0808 08/06/20 1205  GLUCAP 159* 136*   Cardiac Enzymes: No results for input(s): CKTOTAL, CKMB, CKMBINDEX, TROPONINI in the last 168 hours. No results for input(s): PROBNP in the last 8760  hours. Coagulation Profile: No results for input(s): INR, PROTIME in the last 168 hours. Thyroid Function Tests: No results for input(s): TSH, T4TOTAL, FREET4, T3FREE, THYROIDAB in the last 72 hours. Lipid Profile: No results for input(s): CHOL, HDL, LDLCALC, TRIG, CHOLHDL, LDLDIRECT in the last 72 hours. Anemia Panel: No results for input(s): VITAMINB12, FOLATE, FERRITIN, TIBC, IRON, RETICCTPCT in the last 72 hours. Urine analysis:    Component Value Date/Time   COLORURINE YELLOW 07/30/2014 1951   APPEARANCEUR CLEAR 07/30/2014 1951   LABSPEC 1.012 07/30/2014 1951   PHURINE 5.0 07/30/2014 1951   GLUCOSEU NEGATIVE 07/30/2014 1951   HGBUR NEGATIVE 07/30/2014 1951   BILIRUBINUR NEGATIVE 07/30/2014 [place] NEGATIVE 07/30/2014 1951   PROTEINUR 30 (A) 07/30/2014 1951   UROBILINOGEN 0.2 07/30/2014 1951   NITRITE NEGATIVE 07/30/2014 1951   LEUKOCYTESUR NEGATIVE 07/30/2014 1951   Sepsis Labs: Invalid input(s): PROCALCITONIN, [place]  Microbiology: No results found for this or any previous visit (from the past 240 hour(s)).  Radiology Studies: CT ABDOMEN PELVIS WO CONTRAST  Result Date: 08/06/2020 CLINICAL DATA:  76 year old male with left lower quadrant abdominal pain. Blood in stool  yesterday. EXAM: CT ABDOMEN AND PELVIS WITHOUT CONTRAST TECHNIQUE: Multidetector CT imaging of the abdomen and pelvis was performed following the standard protocol without IV contrast. COMPARISON:  CT Abdomen and Pelvis 07/04/2010. FINDINGS: Lower chest: Calcified coronary artery atherosclerosis. Calcified aortic atherosclerosis. No cardiomegaly or pericardial effusion. Chronic lung base scarring appears progressed since 2012 and greater on the right. No pleural effusion. Hepatobiliary: Chronic hepatic steatosis. Enlarged chronic gallstone, now 15 mm diameter. But there is no pericholecystic inflammation. Pancreas: Negative. Spleen: Negative. Adrenals/Urinary Tract: Normal adrenal glands. Chronic left renal exophytic cyst appears stable and benign. No hydronephrosis. No nephrolithiasis. Some renal vascular calcifications are noted. Both ureters are decompressed and negative. The bladder is mildly distended but otherwise unremarkable. Stomach/Bowel: Redundant sigmoid colon containing gas with diverticula. No active inflammation. Mild retained stool mixed with hyperdense material in the descending and upstream large bowel loops. Occasional diverticula there without inflammation. Normal appendix arising from the cecum on series 2, image 63. Negative terminal ileum. No dilated small bowel. Decompressed stomach and duodenum. No free air or free fluid. Vascular/Lymphatic: Extensive Aortoiliac calcified atherosclerosis. Mildly tortuous abdominal aorta. Vascular patency is not evaluated in the absence of IV contrast. Reproductive: Negative. Other: No pelvic free fluid. Musculoskeletal: Chronic thoracic endplate degeneration with some levels of flowing endplate osteophytes and ankylosis. Advanced lower lumbar facet degeneration, and disc degeneration at the lumbosacral junction. No acute osseous abnormality identified. IMPRESSION: 1. No acute or inflammatory process identified in the non-contrast abdomen or pelvis:  Progressed cholelithiasis since 2012 but no CT evidence of acute cholecystitis. Diverticulosis of the large bowel without active inflammation. Chronic hepatic steatosis. Cholelithiasis. 2. Progressed lung base scarring since 2012. 3. Aortic Atherosclerosis (ICD10-I70.0). Electronically Signed   By: Genevie Ann M.D.   On: 08/06/2020 08:27      Taye T. Grandin  If 7PM-7AM, please contact night-coverage [email or web address] 08/06/2020, 2:12 PM

## 2020-08-06 NOTE — Consult Note (Signed)
[facility] Consult: 12:49 PM 08/06/2020  LOS: 0 days    Referring Provider: Georges Lynch  Primary Care Physician:  System, Provider Not In Primary Gastroenterologist:  Dr Ardis Hughs    Reason for Consultation: Many weeks of diarrhea.  Single episode hematochezia last evening.   HPI: [name] is a 75 y.o. male.  Patient takes low-dose aspirin and Plavix.  Coronary stent in 2014.  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  Back surgery.  Renal artery stent 2015.  Orthopedic surgeries.  GERD, on daily Protonix.  See list below of medical problems.  03/2019 colonoscopy.  Surveillance.  Several subcentimeter adenomatous 2012 at colonoscopy by Dr. Michail Sermon, constipation.  Left colon diverticulosis.  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  No recurrent polyps.  Starting around Christmas time he developed initially watery and then loose/liquid/brown stools.  These are occurring 6 or 7 times during a 24-hour.  And occur at nighttime when he is trying to sleep.  Mealtime triggers diarrhea but diarrhea occurs even if he has not eaten.  The only over-the-counter medication he is trialed was bismuth and it was marginally helpful but he is no longer using this.  Before the onset of the diarrhea he used to take Metamucil twice a day but stopped taking this when the diarrhea emerged.  No abdominal pain though he does have tenderness on the right side today on exam.  For the last few days he has had poor appetite.  There is not been any nausea or vomiting.  Weight loss amounting to 11 pounds.  Last night at about 945 he went to have a bowel movement and saw a moderate to large amount of blood mixed in with the stool.  The next BM was this morning,  had a few squirts of brown stool but no further bleeding.  In  the last several months he occasionally sees of small amount of blood when he is wiping which he attributes to his hemorrhoids.  No angina.  No dizziness, no profound weakness.  His PCP at the [place] obtained a CTAPon 3/11 which showed hepatic steatosis, uncomplicated gallstones, colonic diverticulosis and atherosclerosis. Last Plavix was yesterday 3/18.  Other than the bleeding last night has not had any unusual or excessive bleeding or bruising.  Patient had not had any new meds added to his medical regimen in recent months.  Blood pressures normotensive but initially had blood pressure 190/77 at arrival.  Heart rate in the 50s.  Excellent room air saturations in the high 90s. Hgb 12.4 >> 12.  MCV 92.  WBCs, platelets normal.  FOBT negative. Mild AKI. Pending tests include stool ova parasite CTAP w/o contrast.  Cholelithiasis, no cholecystitis.  Colonic diverticulosis.  Hepatic steatosis.  Scarring in the bases of the lungs.  Aortic atherosclerosis.  Degenerative spine disease   Past Medical History:  Diagnosis Date  . Anginal pain ([place]) 07-31-13   coronary stent x1 last 12'14  . Arthritis    osteoarthritis  . Bursitis 07-31-13   heel  . Cancer ([place]) 07-31-13  skin cancer left cheek  . Cataract 2014   bilateral cataract extraction  . 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 Coronary artery disease 07-31-13   100 % blockage in left leg artery,affects left arm b/p readings  . Diabetes mellitus without complication ([place])   . Dysrhythmia 07-31-13    hx. PAC's   . GERD (gastroesophageal reflux disease)   . Heart murmur   . History of kidney stones 07-31-13   x 1  . Hyperlipidemia   . Hypertension   . Peripheral vascular disease ([place])   . Plantar fasciitis of left foot 07-31-13   left leg  . PONV (postoperative nausea and vomiting)   . Rheumatic fever    when 75 years old    Past Surgical History:  Procedure Laterality Date  . CARDIAC  CATHETERIZATION     x1 stents 12'14.  . CIRCUMCISION    . FEMORAL-PERONEAL BYPASS GRAFT Right   . leg stent Right 07-31-13   right leg  . LUMBAR LAMINECTOMY/DECOMPRESSION MICRODISCECTOMY N/A 08/05/2013   Procedure: MICRO LUMBAR DECOMPRESSION L4 - L5;  Surgeon: Johnn Hai, MD;  Location: [facility];  Service: Orthopedics;  Laterality: N/A;  . LUMBAR LAMINECTOMY/DECOMPRESSION MICRODISCECTOMY N/A 07/22/2015   Procedure: REDO DECOMPRESSION LEVEL 2 L3-4 L4-5;  Surgeon: Susa Day, MD;  Location: [facility];  Service: Orthopedics;  Laterality: N/A;  . RENAL ARTERY STENT Right 07-31-13  . SHOULDER ARTHROSCOPY Left    RCTR  . stent in Right kidney    . VASECTOMY      Prior to Admission medications   Medication Sig Start Date End Date Taking? Authorizing Provider  acetaminophen (TYLENOL) 500 MG tablet Take 2 tablets (1,000 mg total) by mouth every 6 (six) hours as needed. Patient taking differently: Take 1,000 mg by mouth every 6 (six) hours as needed for mild pain. 07/30/14  Yes Charlesetta Shanks, MD  aspirin 81 MG chewable tablet Chew 81 mg by mouth daily.   Yes [provider]  carvedilol (COREG) 3.125 MG tablet Take 3.125 mg by mouth 2 (two) times daily with a meal.   Yes [provider]  cetirizine (ZYRTEC) 10 MG tablet Take 10 mg by mouth daily.   Yes [provider]  cholecalciferol (VITAMIN D3) 25 MCG (1000 UNIT) tablet Take 2,000 Units by mouth See admin instructions. Every other week   Yes [provider]  clopidogrel (PLAVIX) 75 MG tablet Take 75 mg by mouth daily.   Yes [provider]  donepezil (ARICEPT) 5 MG tablet Take 10 mg by mouth at bedtime. 05/03/20  Yes [provider]  DULoxetine (CYMBALTA) 20 MG capsule Take 20 mg by mouth daily.   Yes [provider]  famotidine (PEPCID) 20 MG tablet Take 20 mg by mouth at bedtime.   Yes [provider]  glyBURIDE (DIABETA) 2.5 MG tablet Take 2.5 mg by mouth daily with  breakfast.   Yes [provider]  LACTOBACILLUS PROBIOTIC PO Take by mouth.   Yes [provider]  losartan (COZAAR) 100 MG tablet Take 100 mg by mouth daily. 06/07/14  Yes [provider]  metFORMIN (GLUCOPHAGE) 1000 MG tablet Take 1,000 mg by mouth daily.   Yes [provider]  nitroGLYCERIN (NITROSTAT) 0.4 MG SL tablet Place 0.4 mg under the tongue every 5 (five) minutes as needed for chest pain.   Yes [provider]  Omega-3 Fatty Acids (FISH OIL) 1200 MG CPDR  Take 1,200 mg by mouth daily.   Yes [provider]  pantoprazole (PROTONIX) 40 MG tablet Take 40 mg by mouth daily.  07/19/14  Yes [provider]  polyvinyl alcohol (LIQUIFILM TEARS) 1.4 % ophthalmic solution Place 1 drop into both eyes 2 (two) times daily as needed for dry eyes.   Yes [provider]  pravastatin (PRAVACHOL) 40 MG tablet Take 40 mg by mouth daily.   Yes [provider]    Scheduled Meds: . carvedilol  3.125 mg Oral BID WC  . [START ON 08/08/2020] cholecalciferol  2,000 Units Oral See admin instructions  . donepezil  10 mg Oral QHS  . DULoxetine  20 mg Oral Daily  . famotidine  20 mg Oral QHS  . insulin aspart  0-9 Units Subcutaneous TID WC & HS  . loratadine  10 mg Oral Daily  . losartan  100 mg Oral Daily  . omega-3 acid ethyl esters  1 g Oral Daily  . pantoprazole  40 mg Oral Daily  . pravastatin  40 mg Oral Daily   Infusions: . sodium chloride    . sodium chloride 100 mL/hr at 08/06/20 0409   PRN Meds: acetaminophen **OR** acetaminophen, hydrALAZINE, nitroGLYCERIN, ondansetron **OR** ondansetron (ZOFRAN) IV, polyvinyl alcohol, traZODone   Allergies as of 08/05/2020 - Review Complete 08/05/2020  Allergen Reaction Noted  . Contrast media [iodinated diagnostic agents] Nausea And Vomiting 07/28/2013  . Quinine derivatives Other (See Comments) 07/28/2013    Family History  Problem Relation Age of Onset  . Kidney disease  Mother   . Pancreatic cancer Mother   . Lung cancer Father     Social History   Socioeconomic History  . Marital status: Married    Spouse name: Not on file  . Number of children: Not on file  . Years of education: Not on file  . Highest education level: Not on file  Occupational History  . Not on file  Tobacco Use  . Smoking status: Former Smoker    Types: Cigarettes    Quit date: 08/01/2011    Years since quitting: 9.0  . Smokeless tobacco: Never Used  Substance and Sexual Activity  . Alcohol use: No  . Drug use: No  . Sexual activity: Yes  Other Topics Concern  . Not on file  Social History Narrative  . Not on file   Social Determinants of Health   Financial Resource Strain: Not on file  Food Insecurity: Not on file  Transportation Needs: Not on file  Physical Activity: Not on file  Stress: Not on file  Social Connections: Not on file  Intimate Partner Violence: Not on file    REVIEW OF SYSTEMS: Constitutional: Malaise ENT:  No nose bleeds Pulm: No shortness of breath or cough CV:  No palpitations, no LE edema.  No angina GU:  No hematuria, no frequency GI: See HPI Heme: See HPI Transfusions: No previous blood transfusions or issues with anemia. Neuro:  No headaches, no peripheral tingling or numbness.  No syncope, no seizures. Derm:  No itching, no rash or sores.  Endocrine:  No sweats or chills.  No polyuria or dysuria Immunization: Has been vaccinated for COVID-19. No sick contacts.   PHYSICAL EXAM: Vital signs in last 24 hours: Vitals:   08/06/20 0930 08/06/20 1200  BP: 133/71 (!) 167/79  Pulse: (!) 52 (!) 50  Resp: 13 14  Temp:    SpO2: 96% 98%   Wt Readings from Last 3 Encounters:  08/05/20  85.1 kg  03/24/19 86.2 kg  02/27/19 86.2 kg    General: Pleasant, cooperative, does not look ill.  Comfortable.  Alert. Head: No facial asymmetry or swelling.  No signs of head trauma. Eyes: No conjunctival pallor or scleral icterus Ears: Not hard  of hearing Nose: No congestion or discharge Mouth: Oropharynx is moist, pink, clear.  Full dentures in place.  Tongue midline Neck: No JVD, no masses, no thyromegaly. Lungs: Clear bilaterally.  No labored breathing or cough. Heart: RRR.  No MRG.  S1, S2 present. Abdomen: Soft.  Slight tenderness without guarding or rebound on the right.  No masses, HSM, bruits, hernias..   Rectal: Visible external hemorrhoids which are nonthrombosed or erythematous.  Do not look like they have been bleeding.  Some palpable internal hemorrhoids.  No stool or blood on exam glove.  No masses. Musc/Skeltl: No joint redness, swelling or gross deformity. Extremities: No CCE Neurologic: Alert.  Appropriate.  Oriented x3.  Moves all 4 limbs without tremor or weakness. Skin: No rash, no sores, no telangiectasia. Nodes: No cervical or inguinal adenopathy. Psych: Calm, pleasant, cooperative, fluid speech.  Good historian.  Intake/Output from previous day: No intake/output data recorded. Intake/Output this shift: No intake/output data recorded.  LAB RESULTS: Recent Labs    08/05/20 2320 08/06/20 0419 08/06/20 0900  WBC 5.6 5.9  --   HGB 12.4* 12.1* 12.0*  HCT 36.6* 35.6* 35.4*  PLT 215 200  --    BMET Lab Results  Component Value Date   NA 140 08/06/2020   NA 138 08/05/2020   NA 139 07/23/2015   K 3.6 08/06/2020   K 4.2 08/05/2020   K 4.8 07/23/2015   CL 107 08/06/2020   CL 106 08/05/2020   CL 105 07/23/2015   CO2 23 08/06/2020   CO2 23 08/05/2020   CO2 25 07/23/2015   GLUCOSE 144 (H) 08/06/2020   GLUCOSE 146 (H) 08/05/2020   GLUCOSE 192 (H) 07/23/2015   BUN 22 08/06/2020   BUN 24 (H) 08/05/2020   BUN 18 07/23/2015   CREATININE 1.56 (H) 08/06/2020   CREATININE 1.70 (H) 08/05/2020   CREATININE 1.38 (H) 07/23/2015   CALCIUM 9.1 08/06/2020   CALCIUM 9.3 08/05/2020   CALCIUM 8.6 (L) 07/23/2015   LFT No results for input(s): PROT, ALBUMIN, AST, ALT, ALKPHOS, BILITOT, BILIDIR, IBILI in the  last 72 hours. PT/INR No results found for: INR, PROTIME Hepatitis Panel No results for input(s): HEPBSAG, HCVAB, HEPAIGM, HEPBIGM in the last 72 hours. C-Diff No components found for: CDIFF Lipase  No results found for: LIPASE  Drugs of Abuse  No results found for: LABOPIA, COCAINSCRNUR, LABBENZ, AMPHETMU, THCU, LABBARB   RADIOLOGY STUDIES: CT ABDOMEN PELVIS WO CONTRAST  Result Date: 08/06/2020 CLINICAL DATA:  75 year old male with left lower quadrant abdominal pain. Blood in stool yesterday. EXAM: CT ABDOMEN AND PELVIS WITHOUT CONTRAST TECHNIQUE: Multidetector CT imaging of the abdomen and pelvis was performed following the standard protocol without IV contrast. COMPARISON:  CT Abdomen and Pelvis 07/04/2010. FINDINGS: Lower chest: Calcified coronary artery atherosclerosis. Calcified aortic atherosclerosis. No cardiomegaly or pericardial effusion. Chronic lung base scarring appears progressed since 2012 and greater on the right. No pleural effusion. Hepatobiliary: Chronic hepatic steatosis. Enlarged chronic gallstone, now 15 mm diameter. But there is no pericholecystic inflammation. Pancreas: Negative. Spleen: Negative. Adrenals/Urinary Tract: Normal adrenal glands. Chronic left renal exophytic cyst appears stable and benign. No hydronephrosis. No nephrolithiasis. Some renal vascular calcifications are noted. Both ureters are decompressed and negative.  The bladder is mildly distended but otherwise unremarkable. Stomach/Bowel: Redundant sigmoid colon containing gas with diverticula. No active inflammation. Mild retained stool mixed with hyperdense material in the descending and upstream large bowel loops. Occasional diverticula there without inflammation. Normal appendix arising from the cecum on series 2, image 63. Negative terminal ileum. No dilated small bowel. Decompressed stomach and duodenum. No free air or free fluid. Vascular/Lymphatic: Extensive Aortoiliac calcified atherosclerosis. Mildly  tortuous abdominal aorta. Vascular patency is not evaluated in the absence of IV contrast. Reproductive: Negative. Other: No pelvic free fluid. Musculoskeletal: Chronic thoracic endplate degeneration with some levels of flowing endplate osteophytes and ankylosis. Advanced lower lumbar facet degeneration, and disc degeneration at the lumbosacral junction. No acute osseous abnormality identified. IMPRESSION: 1. No acute or inflammatory process identified in the non-contrast abdomen or pelvis: Progressed cholelithiasis since 2012 but no CT evidence of acute cholecystitis. Diverticulosis of the large bowel without active inflammation. Chronic hepatic steatosis. Cholelithiasis. 2. Progressed lung base scarring since 2012. 3. Aortic Atherosclerosis (ICD10-I70.0). Electronically Signed   By: Genevie Ann M.D.   On: 08/06/2020 08:27      IMPRESSION:   *   3 months of diarrhea, one episode of painless hematochezia last night.  Right sided tenderness on exam.  CT scan 3/11 and today 3/19 unremarkable. Adenomatous colon polyps in 2012.  No recurrent polyps but large, nonbleeding AVM at ascending colon seen on surveillance colonoscopy 03/2019.  *    History cardiac stent.  Chronic low-dose aspirin and Plavix.  Last Plavix dose was 3/18.  *      stable, minor, normocytic anemia.    PLAN:     *   Colonoscopy, ?  Timing.  *   Allow carb modified diet for now.   Azucena Freed  08/06/2020, 12:49 PM Phone [phone]

## 2020-08-06 NOTE — Progress Notes (Signed)
Pt. Admitted in the unit via stretcher accompanied by ED staff and wife. Pt. Alert and oriented, vital signs taken and recorded. Oriented to room and use of call light. Needs attended to.

## 2020-08-06 NOTE — H&P (Addendum)
[place]   PATIENT NAME: [name]    MR#:  [id number]  DATE OF BIRTH:  1945-06-05  DATE OF ADMISSION:  08/05/2020  PRIMARY CARE PHYSICIAN: System, Provider Not In   Patient is coming from: Home  REQUESTING/REFERRING PHYSICIAN: Molpus, John, MD  CHIEF COMPLAINT:   Chief Complaint  Patient presents with  . Rectal Bleeding    HISTORY OF PRESENT ILLNESS:  [name] is a 75 y.o. Caucasian male with medical history significant for multiple medical problems that are mentioned below including GI bleeding and diverticulosis that was shown on abdominal and pelvic CT scan 3/11, coronary artery disease status post PCI and stent, osteoarthritis, stage III chronic kidney disease, GERD, urolithiasis, dyslipidemia and hypertension as well as peripheral vascular disease, presented to the ER with acute onset of bright red bleeding per rectum for 1 episode that occurred at 9:45 PM last night.  He admitted to 3 episodes of watery diarrhea.  The patient admits to slight left lower quadrant abdominal pain.  He denied any nausea or vomiting.  No melena or hemoptysis.  No other bleeding diathesis.  No dysuria, oliguria or hematuria or flank pain.  He denies any fever or chills.  No cough or wheezing hemoptysis.  No chest pain or palpitations.  ED Course: When the patient came to the ER blood pressure was 190/77 with otherwise normal vital signs.  Later on blood pressure was 153/70.  Heart rate was 59 then.  Labs revealed a BUN of 24 with a creatinine 1.7 up from eighteen 1.38 on 07/23/2015 and CBC showed hemoglobin of 12.4 and hematocrit 36.6 compared with 13.5 and 39.5 and 07/23/2015.  Stool Hemoccult performed by the ER physician revealed no blood or stool and came back negative.  Imaging: On 07/14/2020 abdominal and pelvic CT scan revealed rectosigmoid diverticulitis with very elongated and tortuous rectosigmoid colon.  It showed calcified gallstones.  It showed normal appendix and terminal  ileum.  The patient will be admitted to a telemetry bed for further evaluation and management PAST MEDICAL HISTORY:   Past Medical History:  Diagnosis Date  . Anginal pain ([place]) 07-31-13   coronary stent x1 last 12'14  . Arthritis    osteoarthritis  . Bursitis 07-31-13   heel  . Cancer ([place]) 07-31-13   skin cancer left cheek  . Cataract 2014   bilateral cataract extraction  . 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 Coronary artery disease 07-31-13   100 % blockage in left leg artery,affects left arm b/p readings  . Diabetes mellitus without complication ([place])   . Dysrhythmia 07-31-13    hx. PAC's   . GERD (gastroesophageal reflux disease)   . Heart murmur   . History of kidney stones 07-31-13   x 1  . Hyperlipidemia   . Hypertension   . Peripheral vascular disease ([place])   . Plantar fasciitis of left foot 07-31-13   left leg  . PONV (postoperative nausea and vomiting)   . Rheumatic fever    when 75 years old    PAST SURGICAL HISTORY:   Past Surgical History:  Procedure Laterality Date  . CARDIAC CATHETERIZATION     x1 stents 12'14.  . CIRCUMCISION    . FEMORAL-PERONEAL BYPASS GRAFT Right   . leg stent Right 07-31-13   right leg  . LUMBAR LAMINECTOMY/DECOMPRESSION MICRODISCECTOMY N/A 08/05/2013   Procedure: MICRO LUMBAR DECOMPRESSION L4 - L5;  Surgeon:  Johnn Hai, MD;  Location: [facility];  Service: Orthopedics;  Laterality: N/A;  . LUMBAR LAMINECTOMY/DECOMPRESSION MICRODISCECTOMY N/A 07/22/2015   Procedure: REDO DECOMPRESSION LEVEL 2 L3-4 L4-5;  Surgeon: Susa Day, MD;  Location: [facility];  Service: Orthopedics;  Laterality: N/A;  . RENAL ARTERY STENT Right 07-31-13  . SHOULDER ARTHROSCOPY Left    RCTR  . stent in Right kidney    . VASECTOMY      SOCIAL HISTORY:   Social History   Tobacco Use  . Smoking status: Former Smoker    Types: Cigarettes    Quit date: 08/01/2011    Years since quitting: 9.0  . Smokeless  tobacco: Never Used  Substance Use Topics  . Alcohol use: No    FAMILY HISTORY:   Family History  Problem Relation Age of Onset  . Kidney disease Mother   . Pancreatic cancer Mother   . Lung cancer Father     DRUG ALLERGIES:   Allergies  Allergen Reactions  . Contrast Media [Iodinated Diagnostic Agents] Nausea And Vomiting  . Quinine Derivatives Other (See Comments)    Pt does not know     REVIEW OF SYSTEMS:   ROS As per history of present illness. All pertinent systems were reviewed above. Constitutional, HEENT, cardiovascular, respiratory, GI, GU, musculoskeletal, neuro, psychiatric, endocrine, integumentary and hematologic systems were reviewed and are otherwise negative/unremarkable except for positive findings mentioned above in the HPI.   MEDICATIONS AT HOME:   Prior to Admission medications   Medication Sig Start Date End Date Taking? Authorizing Provider  acetaminophen (TYLENOL) 500 MG tablet Take 2 tablets (1,000 mg total) by mouth every 6 (six) hours as needed. Patient taking differently: Take 1,000 mg by mouth every 6 (six) hours as needed for mild pain. 07/30/14  Yes Charlesetta Shanks, MD  aspirin 81 MG chewable tablet Chew 81 mg by mouth daily.   Yes [provider]  carvedilol (COREG) 3.125 MG tablet Take 3.125 mg by mouth 2 (two) times daily with a meal.   Yes [provider]  cetirizine (ZYRTEC) 10 MG tablet Take 10 mg by mouth daily.   Yes [provider]  cholecalciferol (VITAMIN D3) 25 MCG (1000 UNIT) tablet Take 2,000 Units by mouth See admin instructions. Every other week   Yes [provider]  clopidogrel (PLAVIX) 75 MG tablet Take 75 mg by mouth daily.   Yes [provider]  donepezil (ARICEPT) 5 MG tablet Take 10 mg by mouth at bedtime. 05/03/20  Yes [provider]  DULoxetine (CYMBALTA) 20 MG capsule Take 20 mg by mouth daily.   Yes [provider]  famotidine (PEPCID) 20 MG tablet Take 20  mg by mouth at bedtime.   Yes [provider]  glyBURIDE (DIABETA) 2.5 MG tablet Take 2.5 mg by mouth daily with breakfast.   Yes [provider]  LACTOBACILLUS PROBIOTIC PO Take by mouth.   Yes [provider]  losartan (COZAAR) 100 MG tablet Take 100 mg by mouth daily. 06/07/14  Yes [provider]  metFORMIN (GLUCOPHAGE) 1000 MG tablet Take 1,000 mg by mouth daily.   Yes [provider]  nitroGLYCERIN (NITROSTAT) 0.4 MG SL tablet Place 0.4 mg under the tongue every 5 (five) minutes as needed for chest pain.   Yes [provider]  Omega-3 Fatty Acids (FISH OIL) 1200 MG CPDR Take 1,200 mg by mouth daily.   Yes [provider]  pantoprazole (PROTONIX) 40 MG tablet Take 40 mg by  mouth daily.  07/19/14  Yes [provider]  polyvinyl alcohol (LIQUIFILM TEARS) 1.4 % ophthalmic solution Place 1 drop into both eyes 2 (two) times daily as needed for dry eyes.   Yes [provider]  pravastatin (PRAVACHOL) 40 MG tablet Take 40 mg by mouth daily.   Yes [provider]      VITAL SIGNS:  Blood pressure (!) 190/77, pulse 70, temperature 98.1 F (36.7 C), temperature source Oral, resp. rate 18, height 5' 9.5" (1.765 m), weight 85.1 kg, SpO2 99 %.  PHYSICAL EXAMINATION:  Physical Exam  GENERAL:  75 y.o.-year-old Caucasian male patient lying in the bed with no acute distress.  EYES: Pupils equal, round, reactive to light and accommodation. No scleral icterus. Extraocular muscles intact.  HEENT: Head atraumatic, normocephalic. Oropharynx and nasopharynx clear.  NECK:  Supple, no jugular venous distention. No thyroid enlargement, no tenderness.  LUNGS: Normal breath sounds bilaterally, no wheezing, rales,rhonchi or crepitation. No use of accessory muscles of respiration.  CARDIOVASCULAR: Regular rate and rhythm, S1, S2 normal. No murmurs, rubs, or gallops.  ABDOMEN: Soft, nondistended, with minimal left lower quadrant  tenderness without rebound tenderness guarding or rigidity.  Bowel sounds present. No organomegaly or mass.  EXTREMITIES: No pedal edema, cyanosis, or clubbing.  NEUROLOGIC: Cranial nerves II through XII are intact. Muscle strength 5/5 in all extremities. Sensation intact. Gait not checked.  PSYCHIATRIC: The patient is alert and oriented x 3.  Normal affect and good eye contact. SKIN: No obvious rash, lesion, or ulcer.   LABORATORY PANEL:   CBC Recent Labs  Lab 08/05/20 2320  WBC 5.6  HGB 12.4*  HCT 36.6*  PLT 215   ------------------------------------------------------------------------------------------------------------------  Chemistries  Recent Labs  Lab 08/05/20 2320  NA 138  K 4.2  CL 106  CO2 23  GLUCOSE 146*  BUN 24*  CREATININE 1.70*  CALCIUM 9.3   ------------------------------------------------------------------------------------------------------------------  Cardiac Enzymes No results for input(s): TROPONINI in the last 168 hours. ------------------------------------------------------------------------------------------------------------------  RADIOLOGY:  No results found.    IMPRESSION AND PLAN:  Active Problems:   GI bleeding 1.  Rectal bleeding likely secondary to diverticulosis.  The patient has no clinical evidence of diverticulitis at this time though he has mild left lower quadrant tenderness but without fever or leukocytosis. -The patient will be admitted to a telemetry bed. -We will follow serial hemoglobins. -We will hold off his Plavix. -GI consultation will be obtained. -I notified Dr. Scarlette Shorts with [facility] about the patient. -An abdominal pelvic CT scan without contrast will be obtained.  2.  Acute kidney injury superimposed on stage IIIa chronic kidney disease. -The patient will be hydrated with IV normal saline. -We will follow BMP with hydration. -We will hold nephrotoxins  3.  Hypertensive urgency. -We will continue  Coreg and hold off Cozaar given acute kidney injury. -We will place him on as needed IV hydralazine.  4.  Type 2 diabetes mellitus. -The patient will be placed on supplement coverage with NovoLog. -We will hold off his Metformin.  5.  GERD. -We will continue PPI therapy.  6.  Dyslipidemia. -We will continue statin therapy.  DVT prophylaxis: SCDs. -Medical GI prophylaxis is currently contraindicated due to GI bleeding. Code Status: full code. Family Communication:  The plan of care was discussed in details with the patient (and family). I answered all questions. The patient agreed to proceed with the above mentioned plan. Further management will depend upon hospital course. Disposition Plan: Back to previous home environment  Consults called: GI consult as mentioned above. All the records are reviewed and case discussed with ED provider.  Status is: Inpatient  Remains inpatient appropriate because:Ongoing diagnostic testing needed not appropriate for outpatient work up, Unsafe d/c plan, IV treatments appropriate due to intensity of illness or inability to take PO and Inpatient level of care appropriate due to severity of illness   Dispo: The patient is from: Home              Anticipated d/c is to: Home              Patient currently is not medically stable to d/c.   Difficult to place patient No   TOTAL TIME TAKING CARE OF THIS PATIENT: 55 minutes.    Christel Mormon M.D on 08/06/2020 at 2:34 AM  [facility]   From 7 PM-7 AM, contact night-coverage [email or web address]  CC: Primary care physician; System, Provider Not In

## 2020-08-06 NOTE — ED Notes (Signed)
Fresh linens placed. Warm blankets given. Updated pt on plan of care.

## 2020-08-07 DIAGNOSIS — E1165 Type 2 diabetes mellitus with hyperglycemia: Secondary | ICD-10-CM

## 2020-08-07 DIAGNOSIS — A080 Rotaviral enteritis: Secondary | ICD-10-CM | POA: Diagnosis not present

## 2020-08-07 DIAGNOSIS — I10 Essential (primary) hypertension: Secondary | ICD-10-CM

## 2020-08-07 DIAGNOSIS — K921 Melena: Secondary | ICD-10-CM | POA: Diagnosis not present

## 2020-08-07 DIAGNOSIS — R197 Diarrhea, unspecified: Secondary | ICD-10-CM | POA: Diagnosis not present

## 2020-08-07 DIAGNOSIS — K922 Gastrointestinal hemorrhage, unspecified: Secondary | ICD-10-CM | POA: Diagnosis not present

## 2020-08-07 DIAGNOSIS — G4700 Insomnia, unspecified: Secondary | ICD-10-CM

## 2020-08-07 LAB — TSH: TSH: 0.599 u[IU]/mL (ref 0.350–4.500)

## 2020-08-07 LAB — CBC
HCT: 37.8 % — ABNORMAL LOW (ref 39.0–52.0)
HCT: 41.7 % (ref 39.0–52.0)
Hemoglobin: 12.7 g/dL — ABNORMAL LOW (ref 13.0–17.0)
Hemoglobin: 14 g/dL (ref 13.0–17.0)
MCH: 31.1 pg (ref 26.0–34.0)
MCH: 31.3 pg (ref 26.0–34.0)
MCHC: 33.6 g/dL (ref 30.0–36.0)
MCHC: 33.6 g/dL (ref 30.0–36.0)
MCV: 92.6 fL (ref 80.0–100.0)
MCV: 93.3 fL (ref 80.0–100.0)
Platelets: 209 10*3/uL (ref 150–400)
Platelets: 254 10*3/uL (ref 150–400)
RBC: 4.08 MIL/uL — ABNORMAL LOW (ref 4.22–5.81)
RBC: 4.47 MIL/uL (ref 4.22–5.81)
RDW: 13.1 % (ref 11.5–15.5)
RDW: 13.2 % (ref 11.5–15.5)
WBC: 7.4 10*3/uL (ref 4.0–10.5)
WBC: 6.4 10*3/uL (ref 4.0–10.5)
nRBC: 0 % (ref 0.0–0.2)
nRBC: 0 % (ref 0.0–0.2)

## 2020-08-07 LAB — GASTROINTESTINAL PANEL BY PCR, STOOL (REPLACES STOOL CULTURE)

## 2020-08-07 LAB — GLUCOSE, CAPILLARY
Glucose-Capillary: 143 mg/dL — ABNORMAL HIGH (ref 70–99)
Glucose-Capillary: 245 mg/dL — ABNORMAL HIGH (ref 70–99)
Glucose-Capillary: 126 mg/dL — ABNORMAL HIGH (ref 70–99)
Glucose-Capillary: 189 mg/dL — ABNORMAL HIGH (ref 70–99)

## 2020-08-07 MED ORDER — MELATONIN 3 MG PO TABS
6.0000 mg | ORAL_TABLET | Freq: Every day | ORAL | Status: DC
  Administered 2020-08-07 – 2020-08-09 (×3): 6 mg via ORAL
  Filled 2020-08-07 (×3): qty 2

## 2020-08-07 MED ORDER — INSULIN ASPART 100 UNIT/ML ~~LOC~~ SOLN
0.0000 [IU] | Freq: Three times a day (TID) | SUBCUTANEOUS | Status: DC
  Administered 2020-08-07 – 2020-08-08 (×3): 2 [IU] via SUBCUTANEOUS
  Administered 2020-08-08: 3 [IU] via SUBCUTANEOUS
  Administered 2020-08-09: 1 [IU] via SUBCUTANEOUS
  Administered 2020-08-09: 2 [IU] via SUBCUTANEOUS
  Administered 2020-08-09: 3 [IU] via SUBCUTANEOUS
  Administered 2020-08-10: 2 [IU] via SUBCUTANEOUS

## 2020-08-07 MED ORDER — CARVEDILOL 6.25 MG PO TABS
6.2500 mg | ORAL_TABLET | Freq: Two times a day (BID) | ORAL | Status: DC
  Administered 2020-08-07 – 2020-08-09 (×5): 6.25 mg via ORAL
  Filled 2020-08-07 (×5): qty 1

## 2020-08-07 MED ORDER — INSULIN ASPART 100 UNIT/ML ~~LOC~~ SOLN: 0.0000 [IU] | Freq: Every day | SUBCUTANEOUS | Status: DC

## 2020-08-07 NOTE — Progress Notes (Signed)
PROGRESS NOTE  [name] CZY:[id number] DOB: 01/05/1946   PCP: System, Provider Not In  Patient is from: Home  DOA: 08/05/2020 LOS: 1  Chief complaints: Rectal bleed  Brief Narrative / Interim history: 75 year old male with PMH of CAD and PAD s/p multiple stents in 2014 still on DAPT, CKD-3A, dementia?,  DM-2, HTN and diverticulosis presenting with an episode of hematochezia about 9:45 PM on 08/05/2020.  Has not further rectal bleed.  He also reports 3-4 loose stools a day for about a month.  He was admitted for what looks like a diverticular bleed.  CT abdomen and pelvis positive for cholelithiasis and diverticulosis without cholecystitis or diverticulitis.  Surprisingly Hemoccult was negative.  Hgb 12.4> 12.0.  GI consulted and planning colonoscopy after Plavix washout.  Last dose on 08/05/2020.  Subjective: Seen and examined earlier this morning.  No major events overnight of this morning.  Reports about 2-3 loose "bloody" bowel movements last night.  Also difficulty sleeping.  No other complaints.  He denies chest pain, dyspnea, nausea, vomiting, abdominal pain, dizziness or UTI symptoms.  Objective: Vitals:   08/06/20 2043 08/07/20 0111 08/07/20 0524 08/07/20 0726  BP: (!) 163/72 (!) 148/67 (!) 172/65 (!) 160/67  Pulse: 60 66 60 61  Resp: 16 16 16    Temp: 98.2 F (36.8 C) 97.7 F (36.5 C) 98.2 F (36.8 C)   TempSrc: Oral Oral Oral   SpO2: 98% 98% 100%   Weight:      Height:        Intake/Output Summary (Last 24 hours) at 08/07/2020 1356 Last data filed at 08/07/2020 1300 Gross per 24 hour  Intake 707.15 ml  Output 4 ml  Net 703.15 ml   Filed Weights   08/05/20 2253  Weight: 85.1 kg    Examination:  GENERAL: No apparent distress.  Nontoxic. HEENT: MMM.  Vision and hearing grossly intact.  NECK: Supple.  No apparent JVD.  RESP:  No IWOB.  Fair aeration bilaterally. CVS:  RRR. Heart sounds normal.  ABD/GI/GU: BS+. Abd soft.  Mild RUQ tenderness with deep  palpation MSK/EXT:  Moves extremities. No apparent deformity. No edema.  SKIN: no apparent skin lesion or wound NEURO: Awake, alert and oriented appropriately.  No apparent focal neuro deficit. PSYCH: Calm. Normal affect.   Procedures:  None yet.  Microbiology summarized: COVID-19 PCR pending. GI panel pending. Stool ova and cyst pending.  Assessment & Plan: Hematochezia-diverticular bleed?  Had one episode about 9:45 PM the night of 3/18.  No further GI bleed but mucus in stool.  Has RLQ tenderness but improved.  CT with cholelithiasis and diverticulosis without cholecystitis, diverticulitis or appendicitis.  No leukocytosis or fever.  Hemoccult negative x2.  He is on DAPT for cardiac and extremity stents.  H&H seems to be stable. Recent Labs    08/05/20 2320 08/06/20 0419 08/06/20 0900 08/06/20 1517 08/07/20 0639  HGB 12.4* 12.1* 12.0* 13.2 12.7*  -GI following-plan for colonoscopy after Plavix washout?  Last dose on 3/18. -Monitor H&H -Continue holding DAPT -SCD for VTE prophylaxis  Subacute diarrhea-reports about 3-4 loose BMs daily over the last 3 months.  Also reports mucus in the stool.  No history of cholecystectomy. CT findings as above.  GIP with rotavirus. -Stool O/C, fecal elastase, fecal calprotectin and fecal chemistry  AKI on CKD-3A: Improving.  He is on losartan at home. Recent Labs    08/05/20 2320 08/06/20 0419  BUN 24* 22  CREATININE 1.70* 1.56*  -Discontinue IV fluid -Avoid  nephrotoxic meds.  Hold losartan.  Hypertensive urgency: Improved. -Discontinue IV fluid -Increase Coreg to 6.25 mg twice daily -Continue as needed hydralazine -Continue holding losartan in the setting of AKI  Controlled NIDDM-2 with macrovascular complication, hyperlipidemia and hyperglycemia: A1c 7.0%.  On glyburide and Metformin at home. Recent Labs  Lab 08/06/20 1205 08/06/20 1827 08/06/20 2046 08/07/20 0729 08/07/20 1136  GLUCAP 136* 197* 179* 143* 245*   -Discontinue glyburide while in-house -Increase SSI to moderate -Continue statin -Could benefit from newer agents with cardiovascular benefits  History of CAD/PVD s/p multiple stents in 2014.  On Plavix, aspirin and a statin at home.  No anginal symptoms. -Aspirin and Plavix on hold in the setting of GI bleed -Continue statin and Coreg.  GERD -Continue PPI  Dementia?  She is oriented appropriately.  Seems to be on Aricept at home. -Continue home Aricept   Insomnia -Try melatonin tonight  Overweight Body mass index is 27.31 kg/m.         DVT prophylaxis:  SCDs Start: 08/06/20 0232  Code Status: Full code Family Communication: Updated patient's wife at bedside on 3/19. Level of care: Telemetry Status is: Inpatient  Remains inpatient appropriate because:Ongoing diagnostic testing needed not appropriate for outpatient work up, Unsafe d/c plan, IV treatments appropriate due to intensity of illness or inability to take PO and Inpatient level of care appropriate due to severity of illness   Dispo: The patient is from: Home              Anticipated d/c is to: Home              Patient currently is not medically stable to d/c.   Difficult to place patient No       Consultants:  Gastroenterology   Sch Meds:  Scheduled Meds: . carvedilol  3.125 mg Oral BID WC  . [START ON 08/08/2020] cholecalciferol  2,000 Units Oral See admin instructions  . donepezil  10 mg Oral QHS  . DULoxetine  20 mg Oral Daily  . famotidine  20 mg Oral QHS  . insulin aspart  0-9 Units Subcutaneous TID WC & HS  . loratadine  10 mg Oral Daily  . losartan  100 mg Oral Daily  . omega-3 acid ethyl esters  1 g Oral Daily  . pantoprazole  40 mg Oral Daily  . pravastatin  40 mg Oral Daily   Continuous Infusions: . sodium chloride 100 mL/hr at 08/06/20 0409   PRN Meds:.acetaminophen **OR** acetaminophen, hydrALAZINE, nitroGLYCERIN, ondansetron **OR** ondansetron (ZOFRAN) IV, polyvinyl alcohol,  traZODone  Antimicrobials: Anti-infectives (From admission, onward)   None       I have personally reviewed the following labs and images: CBC: Recent Labs  Lab 08/05/20 2320 08/06/20 0419 08/06/20 0900 08/06/20 1517 08/07/20 0639  WBC 5.6 5.9  --  5.6 7.4  NEUTROABS 2.8  --   --   --   --   HGB 12.4* 12.1* 12.0* 13.2 12.7*  HCT 36.6* 35.6* 35.4* 39.3 37.8*  MCV 92.2 92.7  --  93.1 92.6  PLT 215 200  --  249 209   BMP &GFR Recent Labs  Lab 08/05/20 2320 08/06/20 0419  NA 138 140  K 4.2 3.6  CL 106 107  CO2 23 23  GLUCOSE 146* 144*  BUN 24* 22  CREATININE 1.70* 1.56*  CALCIUM 9.3 9.1   Estimated Creatinine Clearance: 40.3 mL/min (A) (by C-G formula based on SCr of 1.56 mg/dL (H)). Liver & Pancreas: No  results for input(s): AST, ALT, ALKPHOS, BILITOT, PROT, ALBUMIN in the last 168 hours. No results for input(s): LIPASE, AMYLASE in the last 168 hours. No results for input(s): AMMONIA in the last 168 hours. Diabetic: Recent Labs    08/06/20 0419  HGBA1C 7.0*   Recent Labs  Lab 08/06/20 1205 08/06/20 1827 08/06/20 2046 08/07/20 0729 08/07/20 1136  GLUCAP 136* 197* 179* 143* 245*   Cardiac Enzymes: No results for input(s): CKTOTAL, CKMB, CKMBINDEX, TROPONINI in the last 168 hours. No results for input(s): PROBNP in the last 8760 hours. Coagulation Profile: No results for input(s): INR, PROTIME in the last 168 hours. Thyroid Function Tests: Recent Labs    08/07/20 0800  TSH 0.599   Lipid Profile: No results for input(s): CHOL, HDL, LDLCALC, TRIG, CHOLHDL, LDLDIRECT in the last 72 hours. Anemia Panel: No results for input(s): VITAMINB12, FOLATE, FERRITIN, TIBC, IRON, RETICCTPCT in the last 72 hours. Urine analysis:    Component Value Date/Time   COLORURINE YELLOW 07/30/2014 1951   APPEARANCEUR CLEAR 07/30/2014 1951   LABSPEC 1.012 07/30/2014 1951   PHURINE 5.0 07/30/2014 1951   GLUCOSEU NEGATIVE 07/30/2014 1951   HGBUR NEGATIVE 07/30/2014 1951    BILIRUBINUR NEGATIVE 07/30/2014 [place] NEGATIVE 07/30/2014 1951   PROTEINUR 30 (A) 07/30/2014 1951   UROBILINOGEN 0.2 07/30/2014 1951   NITRITE NEGATIVE 07/30/2014 1951   LEUKOCYTESUR NEGATIVE 07/30/2014 1951   Sepsis Labs: Invalid input(s): PROCALCITONIN, [place]  Microbiology: Recent Results (from the past 240 hour(s))  SARS CORONAVIRUS 2 (TAT 6-24 HRS) Nasopharyngeal Nasopharyngeal Swab     Status: None   Collection Time: 08/06/20  1:37 AM   Specimen: Nasopharyngeal Swab  Result Value Ref Range Status   SARS Coronavirus 2 NEGATIVE NEGATIVE Final    Comment: (NOTE) SARS-CoV-2 target nucleic acids are NOT DETECTED.  The SARS-CoV-2 RNA is generally detectable in upper and lower respiratory specimens during the acute phase of infection. Negative results do not preclude SARS-CoV-2 infection, do not rule out co-infections with other pathogens, and should not be used as the sole basis for treatment or other patient management decisions. Negative results must be combined with clinical observations, patient history, and epidemiological information. The expected result is Negative.  Fact Sheet for Patients: SugarRoll.be  Fact Sheet for Healthcare Providers: https://www.woods-mathews.com/  This test is not yet approved or cleared by the [place] FDA and  has been authorized for detection and/or diagnosis of SARS-CoV-2 by FDA under an Emergency Use Authorization (EUA). This EUA will remain  in effect (meaning this test can be used) for the duration of the COVID-19 declaration under Se ction 564(b)(1) of the Act, 21 U.S.C. section 360bbb-3(b)(1), unless the authorization is terminated or revoked sooner.  Performed at [facility], [place] [address] [place], [place] [address]   Gastrointestinal Panel by PCR , Stool     Status: Abnormal   Collection Time: 08/06/20  4:31 PM   Specimen: STOOL  Result Value Ref Range  Status   Campylobacter species NOT DETECTED NOT DETECTED Final   Plesimonas shigelloides NOT DETECTED NOT DETECTED Final   Salmonella species NOT DETECTED NOT DETECTED Final   Yersinia enterocolitica NOT DETECTED NOT DETECTED Final   Vibrio species NOT DETECTED NOT DETECTED Final   Vibrio cholerae NOT DETECTED NOT DETECTED Final   Enteroaggregative E coli (EAEC) NOT DETECTED NOT DETECTED Final   Enteropathogenic E coli (EPEC) NOT DETECTED NOT DETECTED Final   Enterotoxigenic E coli (ETEC) NOT DETECTED NOT DETECTED Final  Shiga like toxin producing E coli (STEC) NOT DETECTED NOT DETECTED Final   Shigella/Enteroinvasive E coli (EIEC) NOT DETECTED NOT DETECTED Final   Cryptosporidium NOT DETECTED NOT DETECTED Final   Cyclospora cayetanensis NOT DETECTED NOT DETECTED Final   Entamoeba histolytica NOT DETECTED NOT DETECTED Final   Giardia lamblia NOT DETECTED NOT DETECTED Final   Adenovirus F40/41 NOT DETECTED NOT DETECTED Final   Astrovirus NOT DETECTED NOT DETECTED Final   Norovirus GI/GII NOT DETECTED NOT DETECTED Final   Rotavirus A DETECTED (A) NOT DETECTED Final   Sapovirus (I, II, IV, and V) NOT DETECTED NOT DETECTED Final    Comment: Performed at [facility], [address], [place],  [address]    Radiology Studies: No results found.    Taye T. Bantam  If 7PM-7AM, please contact night-coverage [email or web address] 08/07/2020, 1:56 PM

## 2020-08-08 DIAGNOSIS — A080 Rotaviral enteritis: Secondary | ICD-10-CM | POA: Diagnosis not present

## 2020-08-08 DIAGNOSIS — K625 Hemorrhage of anus and rectum: Secondary | ICD-10-CM

## 2020-08-08 DIAGNOSIS — Z7902 Long term (current) use of antithrombotics/antiplatelets: Secondary | ICD-10-CM | POA: Diagnosis not present

## 2020-08-08 DIAGNOSIS — K922 Gastrointestinal hemorrhage, unspecified: Secondary | ICD-10-CM | POA: Diagnosis not present

## 2020-08-08 DIAGNOSIS — K529 Noninfective gastroenteritis and colitis, unspecified: Secondary | ICD-10-CM | POA: Diagnosis not present

## 2020-08-08 DIAGNOSIS — R197 Diarrhea, unspecified: Secondary | ICD-10-CM | POA: Diagnosis not present

## 2020-08-08 DIAGNOSIS — K921 Melena: Secondary | ICD-10-CM | POA: Diagnosis not present

## 2020-08-08 LAB — RENAL FUNCTION PANEL
Albumin: 3.8 g/dL (ref 3.5–5.0)
Anion gap: 9 (ref 5–15)
BUN: 24 mg/dL — ABNORMAL HIGH (ref 8–23)
CO2: 24 mmol/L (ref 22–32)
Calcium: 9.3 mg/dL (ref 8.9–10.3)
Chloride: 106 mmol/L (ref 98–111)
Creatinine, Ser: 1.99 mg/dL — ABNORMAL HIGH (ref 0.61–1.24)
GFR, Estimated: 34 mL/min — ABNORMAL LOW (ref >60)
Glucose, Bld: 162 mg/dL — ABNORMAL HIGH (ref 70–99)
Phosphorus: 4 mg/dL (ref 2.5–4.6)
Potassium: 3.9 mmol/L (ref 3.5–5.1)
Sodium: 139 mmol/L (ref 135–145)

## 2020-08-08 LAB — CBC
HCT: 38.4 % — ABNORMAL LOW (ref 39.0–52.0)
Hemoglobin: 12.8 g/dL — ABNORMAL LOW (ref 13.0–17.0)
MCH: 31.3 pg (ref 26.0–34.0)
MCHC: 33.3 g/dL (ref 30.0–36.0)
MCV: 93.9 fL (ref 80.0–100.0)
Platelets: 214 10*3/uL (ref 150–400)
RBC: 4.09 MIL/uL — ABNORMAL LOW (ref 4.22–5.81)
RDW: 13.2 % (ref 11.5–15.5)
WBC: 6.9 10*3/uL (ref 4.0–10.5)
nRBC: 0 % (ref 0.0–0.2)

## 2020-08-08 LAB — MAGNESIUM: Magnesium: 2 mg/dL (ref 1.7–2.4)

## 2020-08-08 LAB — GLUCOSE, CAPILLARY
Glucose-Capillary: 150 mg/dL — ABNORMAL HIGH (ref 70–99)
Glucose-Capillary: 179 mg/dL — ABNORMAL HIGH (ref 70–99)
Glucose-Capillary: 124 mg/dL — ABNORMAL HIGH (ref 70–99)
Glucose-Capillary: 148 mg/dL — ABNORMAL HIGH (ref 70–99)

## 2020-08-08 MED ORDER — AMLODIPINE BESYLATE 10 MG PO TABS
10.0000 mg | ORAL_TABLET | Freq: Every day | ORAL | Status: DC
  Administered 2020-08-08 – 2020-08-10 (×3): 10 mg via ORAL
  Filled 2020-08-08 (×3): qty 1

## 2020-08-08 MED ORDER — SODIUM CHLORIDE 0.9 % IV SOLN: INTRAVENOUS | Status: AC

## 2020-08-08 MED ORDER — PEG-KCL-NACL-NASULF-NA ASC-C 100 G PO SOLR
0.5000 | ORAL | Status: AC
  Administered 2020-08-09 (×2): 100 g via ORAL

## 2020-08-08 MED ORDER — PEG-KCL-NACL-NASULF-NA ASC-C 100 G PO SOLR
0.5000 | ORAL | Status: DC
  Filled 2020-08-08: qty 1

## 2020-08-08 NOTE — Progress Notes (Signed)
PROGRESS NOTE  [name] VPX:[id number] DOB: 1946-01-14   PCP: System, Provider Not In  Patient is from: Home  DOA: 08/05/2020 LOS: 2  Chief complaints: Rectal bleed  Brief Narrative / Interim history: 75 year old male with PMH of CAD and PAD s/p multiple stents in 2014 still on DAPT, CKD-3A, dementia?,  DM-2, HTN and diverticulosis presenting with an episode of hematochezia about 9:45 PM on 08/05/2020.  Has not further rectal bleed.  He also reports 3-4 loose stools a day for about a month.  He was admitted for what looks like a diverticular bleed.  CT abdomen and pelvis positive for cholelithiasis and diverticulosis without cholecystitis or diverticulitis.  Surprisingly Hemoccult was negative.  Hgb 12.4> 12.0.  GI consulted and planning colonoscopy after Plavix washout.  Last dose on 08/05/2020.   Subjective: Seen and examined earlier this morning.  No major events overnight of this morning.  He had one episode of diarrhea that he describes as watery with some solids in it.  He denies blood in the stool.  Denies nausea, vomiting, abdominal pain, chest pain or respiratory symptoms.  Objective: Vitals:   08/07/20 1401 08/07/20 2101 08/08/20 0550 08/08/20 1350  BP: (!) 152/75 (!) 161/68 (!) 149/65 125/72  Pulse: 61 (!) 58 68 63  Resp: 18 20 16 16   Temp: 97.8 F (36.6 C) 97.7 F (36.5 C) (!) 97.3 F (36.3 C) 97.9 F (36.6 C)  TempSrc: Oral Oral Oral Oral  SpO2: 100% 100% 98% 97%  Weight:      Height:       No intake or output data in the 24 hours ending 08/08/20 1403 Filed Weights   08/05/20 2253  Weight: 85.1 kg    Examination: GENERAL: No apparent distress.  Nontoxic. HEENT: MMM.  Vision and hearing grossly intact.  NECK: Supple.  No apparent JVD.  RESP:  No IWOB.  Fair aeration bilaterally. CVS:  RRR. Heart sounds normal.  ABD/GI/GU: BS+. Abd soft, NTND.  MSK/EXT:  Moves extremities. No apparent deformity. No edema.  SKIN: no apparent skin lesion or wound NEURO:  Awake, alert and oriented appropriately.  No apparent focal neuro deficit. PSYCH: Calm. Normal affect.   Procedures:  None yet.  Microbiology summarized: COVID-19 PCR pending. GI panel pending. Stool ova and cyst pending.  Assessment & Plan: Hematochezia-diverticular bleed?  Has RLQ tenderness but improved.  CT with cholelithiasis and diverticulosis without cholecystitis, diverticulitis or appendicitis.  No leukocytosis or fever.  Hemoccult negative x2.  He is on DAPT for cardiac and extremity stents.  No further hematochezia in the last 24 hours.  Tolerating soft diet.  H&H seems to be stable. Recent Labs    08/05/20 2320 08/06/20 0419 08/06/20 0900 08/06/20 1517 08/07/20 0639 08/07/20 1628 08/08/20 0322  HGB 12.4* 12.1* 12.0* 13.2 12.7* 14.0 12.8*  -GI following-plan for colonoscopy after Plavix washout?  Last dose on 3/18. -Monitor H&H -Continue holding DAPT -SCD for VTE prophylaxis  Subacute diarrhea/rotavirus infection-3-4 loose BMs daily for about 3 months.  Also reports mucus in the stool.  No history of cholecystectomy. CT findings as above.  GIP with rotavirus.  Diarrhea improved.  Only 1 bowel movement for last 24 hours. -Stool O/C, fecal elastase, fecal calprotectin and fecal chemistry pending  AKI on CKD-3A: Creatinine up to 1.99 after initial improvement probably after resuming losartan. Recent Labs    08/05/20 2320 08/06/20 0419 08/08/20 0322  BUN 24* 22 24*  CREATININE 1.70* 1.56* 1.99*  -Resume gentle IV fluid -Hold losartan -  Start amlodipine for blood pressure control  Hypertensive urgency: Improved. -Continue Coreg 6.25 mg twice daily -Discontinue losartan in the setting of AKI -Start amlodipine -Continue as needed hydralazine  Controlled NIDDM-2 with macrovascular complication, hyperlipidemia and hyperglycemia: A1c 7.0%.  On glyburide and Metformin at home. Recent Labs  Lab 08/07/20 1136 08/07/20 1600 08/07/20 2105 08/08/20 0739  08/08/20 1206  GLUCAP 245* 126* 189* 150* 179*  -Discontinued glyburide while in-house -Continue SSI-moderate -Add mealtime coverage at 3 units -Continue statin -Could benefit from newer agents with cardiovascular benefits  History of CAD/PVD s/p multiple stents in 2014.  On Plavix, aspirin and a statin at home.  No anginal symptoms. -Aspirin and Plavix on hold in the setting of GI bleed -Continue statin and Coreg.  GERD -Continue PPI  Dementia?  She is oriented appropriately.  Seems to be on Aricept at home. -Continue home Aricept   Insomnia -Continue melatonin  Overweight Body mass index is 27.31 kg/m.         DVT prophylaxis:  SCDs Start: 08/06/20 0232  Code Status: Full code Family Communication: Patient and RN.  None at bedside. Level of care: Telemetry Status is: Inpatient  Remains inpatient appropriate because:Ongoing diagnostic testing needed not appropriate for outpatient work up, Unsafe d/c plan, IV treatments appropriate due to intensity of illness or inability to take PO and Inpatient level of care appropriate due to severity of illness   Dispo: The patient is from: Home              Anticipated d/c is to: Home              Patient currently is not medically stable to d/c.   Difficult to place patient No       Consultants:  Gastroenterology   Sch Meds:  Scheduled Meds: . amLODipine  10 mg Oral Daily  . carvedilol  6.25 mg Oral BID WC  . cholecalciferol  2,000 Units Oral See admin instructions  . donepezil  10 mg Oral QHS  . DULoxetine  20 mg Oral Daily  . famotidine  20 mg Oral QHS  . insulin aspart  0-15 Units Subcutaneous TID WC  . insulin aspart  0-5 Units Subcutaneous QHS  . loratadine  10 mg Oral Daily  . melatonin  6 mg Oral QHS  . omega-3 acid ethyl esters  1 g Oral Daily  . pantoprazole  40 mg Oral Daily  . pravastatin  40 mg Oral Daily   Continuous Infusions: . sodium chloride     PRN Meds:.acetaminophen **OR**  acetaminophen, hydrALAZINE, nitroGLYCERIN, ondansetron **OR** ondansetron (ZOFRAN) IV, polyvinyl alcohol, traZODone  Antimicrobials: Anti-infectives (From admission, onward)   None       I have personally reviewed the following labs and images: CBC: Recent Labs  Lab 08/05/20 2320 08/06/20 0419 08/06/20 0900 08/06/20 1517 08/07/20 0639 08/07/20 1628 08/08/20 0322  WBC 5.6 5.9  --  5.6 7.4 6.4 6.9  NEUTROABS 2.8  --   --   --   --   --   --   HGB 12.4* 12.1* 12.0* 13.2 12.7* 14.0 12.8*  HCT 36.6* 35.6* 35.4* 39.3 37.8* 41.7 38.4*  MCV 92.2 92.7  --  93.1 92.6 93.3 93.9  PLT 215 200  --  249 209 254 214   BMP &GFR Recent Labs  Lab 08/05/20 2320 08/06/20 0419 08/08/20 0322  NA 138 140 139  K 4.2 3.6 3.9  CL 106 107 106  CO2 23 23 24   GLUCOSE  146* 144* 162*  BUN 24* 22 24*  CREATININE 1.70* 1.56* 1.99*  CALCIUM 9.3 9.1 9.3  MG  --   --  2.0  PHOS  --   --  4.0   Estimated Creatinine Clearance: 31.6 mL/min (A) (by C-G formula based on SCr of 1.99 mg/dL (H)). Liver & Pancreas: Recent Labs  Lab 08/08/20 0322  ALBUMIN 3.8   No results for input(s): LIPASE, AMYLASE in the last 168 hours. No results for input(s): AMMONIA in the last 168 hours. Diabetic: Recent Labs    08/06/20 0419  HGBA1C 7.0*   Recent Labs  Lab 08/07/20 1136 08/07/20 1600 08/07/20 2105 08/08/20 0739 08/08/20 1206  GLUCAP 245* 126* 189* 150* 179*   Cardiac Enzymes: No results for input(s): CKTOTAL, CKMB, CKMBINDEX, TROPONINI in the last 168 hours. No results for input(s): PROBNP in the last 8760 hours. Coagulation Profile: No results for input(s): INR, PROTIME in the last 168 hours. Thyroid Function Tests: Recent Labs    08/07/20 0800  TSH 0.599   Lipid Profile: No results for input(s): CHOL, HDL, LDLCALC, TRIG, CHOLHDL, LDLDIRECT in the last 72 hours. Anemia Panel: No results for input(s): VITAMINB12, FOLATE, FERRITIN, TIBC, IRON, RETICCTPCT in the last 72 hours. Urine  analysis:    Component Value Date/Time   COLORURINE YELLOW 07/30/2014 1951   APPEARANCEUR CLEAR 07/30/2014 1951   LABSPEC 1.012 07/30/2014 1951   PHURINE 5.0 07/30/2014 1951   GLUCOSEU NEGATIVE 07/30/2014 1951   HGBUR NEGATIVE 07/30/2014 1951   BILIRUBINUR NEGATIVE 07/30/2014 [place] NEGATIVE 07/30/2014 1951   PROTEINUR 30 (A) 07/30/2014 1951   UROBILINOGEN 0.2 07/30/2014 1951   NITRITE NEGATIVE 07/30/2014 1951   LEUKOCYTESUR NEGATIVE 07/30/2014 1951   Sepsis Labs: Invalid input(s): PROCALCITONIN, [place]  Microbiology: Recent Results (from the past 240 hour(s))  SARS CORONAVIRUS 2 (TAT 6-24 HRS) Nasopharyngeal Nasopharyngeal Swab     Status: None   Collection Time: 08/06/20  1:37 AM   Specimen: Nasopharyngeal Swab  Result Value Ref Range Status   SARS Coronavirus 2 NEGATIVE NEGATIVE Final    Comment: (NOTE) SARS-CoV-2 target nucleic acids are NOT DETECTED.  The SARS-CoV-2 RNA is generally detectable in upper and lower respiratory specimens during the acute phase of infection. Negative results do not preclude SARS-CoV-2 infection, do not rule out co-infections with other pathogens, and should not be used as the sole basis for treatment or other patient management decisions. Negative results must be combined with clinical observations, patient history, and epidemiological information. The expected result is Negative.  Fact Sheet for Patients: SugarRoll.be  Fact Sheet for Healthcare Providers: https://www.woods-mathews.com/  This test is not yet approved or cleared by the [place] FDA and  has been authorized for detection and/or diagnosis of SARS-CoV-2 by FDA under an Emergency Use Authorization (EUA). This EUA will remain  in effect (meaning this test can be used) for the duration of the COVID-19 declaration under Se ction 564(b)(1) of the Act, 21 U.S.C. section 360bbb-3(b)(1), unless the authorization is  terminated or revoked sooner.  Performed at [facility], [place] [address], [place], [place] [address]   Gastrointestinal Panel by PCR , Stool     Status: Abnormal   Collection Time: 08/06/20  4:31 PM   Specimen: STOOL  Result Value Ref Range Status   Campylobacter species NOT DETECTED NOT DETECTED Final   Plesimonas shigelloides NOT DETECTED NOT DETECTED Final   Salmonella species NOT DETECTED NOT DETECTED Final   Yersinia enterocolitica NOT DETECTED NOT  DETECTED Final   Vibrio species NOT DETECTED NOT DETECTED Final   Vibrio cholerae NOT DETECTED NOT DETECTED Final   Enteroaggregative E coli (EAEC) NOT DETECTED NOT DETECTED Final   Enteropathogenic E coli (EPEC) NOT DETECTED NOT DETECTED Final   Enterotoxigenic E coli (ETEC) NOT DETECTED NOT DETECTED Final   Shiga like toxin producing E coli (STEC) NOT DETECTED NOT DETECTED Final   Shigella/Enteroinvasive E coli (EIEC) NOT DETECTED NOT DETECTED Final   Cryptosporidium NOT DETECTED NOT DETECTED Final   Cyclospora cayetanensis NOT DETECTED NOT DETECTED Final   Entamoeba histolytica NOT DETECTED NOT DETECTED Final   Giardia lamblia NOT DETECTED NOT DETECTED Final   Adenovirus F40/41 NOT DETECTED NOT DETECTED Final   Astrovirus NOT DETECTED NOT DETECTED Final   Norovirus GI/GII NOT DETECTED NOT DETECTED Final   Rotavirus A DETECTED (A) NOT DETECTED Final   Sapovirus (I, II, IV, and V) NOT DETECTED NOT DETECTED Final    Comment: Performed at [facility] [address] [place], [place] [address]    Radiology Studies: No results found.    Taye T. Avis  If 7PM-7AM, please contact night-coverage [email or web address] 08/08/2020, 2:03 PM

## 2020-08-08 NOTE — Progress Notes (Signed)
° °   Progress Note   Subjective  Chief Complaint: Diarrhea and hematochezia  GI pathogen panel came back positive for the rotavirus.  Patient tells me he has been eating regular food and his last diarrheal movement was yesterday afternoon, he has had none since then.  Feeling well.  No further bleeding.   Objective   Vital signs in last 24 hours: Temp:  [97.3 F (36.3 C)-97.8 F (36.6 C)] 97.3 F (36.3 C) (03/21 0550) Pulse Rate:  [58-68] 68 (03/21 0550) Resp:  [16-20] 16 (03/21 0550) BP: (149-161)/(65-75) 149/65 (03/21 0550) SpO2:  [98 %-100 %] 98 % (03/21 0550) Last BM Date: 08/07/20 General:    white male in NAD Heart:  Regular rate and rhythm; no murmurs Lungs: Respirations even and unlabored, lungs CTA bilaterally Abdomen:  Soft, mild LLQ ttp and nondistended. Normal bowel sounds. Extremities:  Without edema. Neurologic:  Alert and oriented,  grossly normal neurologically. Psych:  Cooperative. Normal mood and affect.  Intake/Output from previous day: 03/20 0701 - 03/21 0700 In: 360 [P.O.:360] Out: 3 [Urine:2; Stool:1]  Lab Results: Recent Labs    08/07/20 0639 08/07/20 1628 08/08/20 0322  WBC 7.4 6.4 6.9  HGB 12.7* 14.0 12.8*  HCT 37.8* 41.7 38.4*  PLT 209 254 214   BMET Recent Labs    08/05/20 2320 08/06/20 0419 08/08/20 0322  NA 138 140 139  K 4.2 3.6 3.9  CL 106 107 106  CO2 23 23 24   GLUCOSE 146* 144* 162*  BUN 24* 22 24*  CREATININE 1.70* 1.56* 1.99*  CALCIUM 9.3 9.1 9.3   LFT Recent Labs    08/08/20 0322  ALBUMIN 3.8     Assessment / Plan:   Assessment: 1.  Chronic diarrhea: Reported 3 months of diarrhea, right-sided tenderness on exam, CT scan 3/11 and 3/19 unremarkable, pathogen panel came back positive for rotavirus, only 1 diarrheal stool within the past 24 hours 2.  Painless hematochezia: 1 episode 3.  History of cardiac stent: Maintained on aspirin and Plavix, last dose of Plavix 3/18  Plan: 1.  Patient has had no further  bleeding or diarrhea over the past 24 hours.  Plans from previous on-call team were for a colonoscopy after patient's Plavix had washed out for 5 days (last dose 3/18).  Will let Dr. Havery Moros decide if this is still recommended. 2.  Please await any further recommendations from Dr. Havery Moros later today  Thank you for your kind consultation, we will continue to follow.   LOS: 2 days   [name]  08/08/2020, 11:03 AM

## 2020-08-09 DIAGNOSIS — A080 Rotaviral enteritis: Secondary | ICD-10-CM | POA: Diagnosis not present

## 2020-08-09 DIAGNOSIS — R197 Diarrhea, unspecified: Secondary | ICD-10-CM | POA: Diagnosis not present

## 2020-08-09 DIAGNOSIS — K922 Gastrointestinal hemorrhage, unspecified: Secondary | ICD-10-CM | POA: Diagnosis not present

## 2020-08-09 DIAGNOSIS — K529 Noninfective gastroenteritis and colitis, unspecified: Secondary | ICD-10-CM

## 2020-08-09 DIAGNOSIS — K921 Melena: Secondary | ICD-10-CM | POA: Diagnosis not present

## 2020-08-09 DIAGNOSIS — Z7902 Long term (current) use of antithrombotics/antiplatelets: Secondary | ICD-10-CM

## 2020-08-09 LAB — RENAL FUNCTION PANEL
Albumin: 3.6 g/dL (ref 3.5–5.0)
Anion gap: 7 (ref 5–15)
BUN: 29 mg/dL — ABNORMAL HIGH (ref 8–23)
CO2: 22 mmol/L (ref 22–32)
Calcium: 8.9 mg/dL (ref 8.9–10.3)
Chloride: 108 mmol/L (ref 98–111)
Creatinine, Ser: 1.72 mg/dL — ABNORMAL HIGH (ref 0.61–1.24)
GFR, Estimated: 40 mL/min — ABNORMAL LOW (ref >60)
Glucose, Bld: 165 mg/dL — ABNORMAL HIGH (ref 70–99)
Phosphorus: 3.7 mg/dL (ref 2.5–4.6)
Potassium: 3.8 mmol/L (ref 3.5–5.1)
Sodium: 137 mmol/L (ref 135–145)

## 2020-08-09 LAB — HEMOGLOBIN AND HEMATOCRIT, BLOOD
HCT: 35.6 % — ABNORMAL LOW (ref 39.0–52.0)
Hemoglobin: 12.1 g/dL — ABNORMAL LOW (ref 13.0–17.0)

## 2020-08-09 LAB — GLUCOSE, CAPILLARY
Glucose-Capillary: 131 mg/dL — ABNORMAL HIGH (ref 70–99)
Glucose-Capillary: 157 mg/dL — ABNORMAL HIGH (ref 70–99)
Glucose-Capillary: 149 mg/dL — ABNORMAL HIGH (ref 70–99)
Glucose-Capillary: 102 mg/dL — ABNORMAL HIGH (ref 70–99)

## 2020-08-09 LAB — MAGNESIUM: Magnesium: 2 mg/dL (ref 1.7–2.4)

## 2020-08-09 MED ORDER — SODIUM CHLORIDE 0.9 % IV SOLN: INTRAVENOUS | Status: DC

## 2020-08-09 NOTE — Anesthesia Preprocedure Evaluation (Addendum)
Anesthesia Evaluation  Patient identified by MRN, date of birth, ID band Patient awake    Reviewed: Allergy & Precautions, NPO status , Patient's Chart, lab work & pertinent test results, reviewed documented beta blocker date and time   History of Anesthesia Complications (+) PONV and history of anesthetic complications  Airway Mallampati: I  TM Distance: >3 FB Neck ROM: Full    Dental  (+) Edentulous Lower, Edentulous Upper   Pulmonary neg pulmonary ROS, former smoker,    Pulmonary exam normal        Cardiovascular hypertension, Pt. on home beta blockers and Pt. on medications + CAD, + Cardiac Stents (2014) and + Peripheral Vascular Disease  Normal cardiovascular exam     Neuro/Psych negative neurological ROS  negative psych ROS   GI/Hepatic Neg liver ROS, GERD  Controlled and Medicated,Rectal bleeding   Endo/Other  diabetes, Type 2, Oral Hypoglycemic Agents  Renal/GU Renal InsufficiencyRenal disease  negative genitourinary   Musculoskeletal  (+) Arthritis ,   Abdominal   Peds  Hematology negative hematology ROS (+)   Anesthesia Other Findings Day of surgery medications reviewed with patient.  Reproductive/Obstetrics negative OB ROS                           Anesthesia Physical Anesthesia Plan  ASA: III  Anesthesia Plan: MAC   Post-op Pain Management:    Induction:   PONV Risk Score and Plan: Treatment may vary due to age or medical condition and Propofol infusion  Airway Management Planned: Natural Airway and Nasal Cannula  Additional Equipment: None  Intra-op Plan:   Post-operative Plan:   Informed Consent: I have reviewed the patients History and Physical, chart, labs and discussed the procedure including the risks, benefits and alternatives for the proposed anesthesia with the patient or authorized representative who has indicated his/her understanding and acceptance.        Plan Discussed with: CRNA  Anesthesia Plan Comments:        Anesthesia Quick Evaluation

## 2020-08-09 NOTE — Progress Notes (Signed)
Patient ID: [name], male   DOB: 11/05/1945, 75 y.o.   MRN: [id number]    Progress Note   Subjective   Day # 3  CC; diarrhea x3 months,, fecal incontinence rectal bleeding  Plavix on hold-last dose 08/05/2020  Patient doing well this morning, has had clear liquid breakfast.  No complaints of abdominal discomfort, no bowel movements or diarrhea yesterday or today    Objective   Vital signs in last 24 hours: Temp:  [97.9 F (36.6 C)-98.6 F (37 C)] 98.6 F (37 C) (03/22 0441) Pulse Rate:  [59-63] 59 (03/22 0441) Resp:  [16-18] 18 (03/22 0441) BP: (125-181)/(68-72) 141/68 (03/22 0441) SpO2:  [97 %-100 %] 100 % (03/22 0441) Last BM Date: 08/07/20 General: Elderly white male in NAD Heart:  Regular rate and rhythm; no murmurs Lungs: Respirations even and unlabored, lungs CTA bilaterally Abdomen:  Soft, nontender and nondistended. Normal bowel sounds. Extremities:  Without edema. Neurologic:  Alert and oriented,  grossly normal neurologically. Psych:  Cooperative. Normal mood and affect.  Intake/Output from previous day: 03/21 0701 - 03/22 0700 In: 220 [P.O.:220] Out: -  Intake/Output this shift: No intake/output data recorded.  Lab Results: Recent Labs    08/07/20 0639 08/07/20 1628 08/08/20 0322 08/09/20 0339  WBC 7.4 6.4 6.9  --   HGB 12.7* 14.0 12.8* 12.1*  HCT 37.8* 41.7 38.4* 35.6*  PLT 209 254 214  --    BMET Recent Labs    08/08/20 0322 08/09/20 0339  NA 139 137  K 3.9 3.8  CL 106 108  CO2 24 22  GLUCOSE 162* 165*  BUN 24* 29*  CREATININE 1.99* 1.72*  CALCIUM 9.3 8.9   LFT Recent Labs    08/09/20 0339  ALBUMIN 3.6   PT/INR No results for input(s): LABPROT, INR in the last 72 hours.      Assessment / Plan:    #1 75-year-old male with 3-month history of diarrhea, fecal incontinence and recent episodes of rectal bleeding this past weekend  GI path panel pertinent for rotavirus although this would not account for symptoms dating  back 3 months. Patient with previously documented right-sided AVM at prior colonoscopy.  Was felt this may be responsible for recent bleeding versus hemorrhoidal, versus other colonic or upper gut lesion.  #2 anemia-mild stable #3 peripheral vascular disease, and coronary artery disease status post multiple stents-maintained on Plavix on hold since 08/05/2020  Plan; clear liquid diet today Patient is scheduled for colonoscopy and EGD tomorrow with Dr. Armbruster, further recommendations pending findings at endoscopic evaluation. Continue to hold Plavix      Active Problems:   GI bleeding   Antiplatelet or antithrombotic long-term use   Rectal bleeding     LOS: 3 days   Maya Arcand PA-C 08/09/2020, 8:36 AM  

## 2020-08-09 NOTE — H&P (View-Only) (Signed)
Patient ID: [name], male   DOB: August 13, 1945, 75 y.o.   MRN: [id number]    Progress Note   Subjective   Day # 3  CC; diarrhea x3 months,, fecal incontinence rectal bleeding  Plavix on hold-last dose 08/05/2020  Patient doing well this morning, has had clear liquid breakfast.  No complaints of abdominal discomfort, no bowel movements or diarrhea yesterday or today    Objective   Vital signs in last 24 hours: Temp:  [97.9 F (36.6 C)-98.6 F (37 C)] 98.6 F (37 C) (03/22 0441) Pulse Rate:  [59-63] 59 (03/22 0441) Resp:  [16-18] 18 (03/22 0441) BP: (125-181)/(68-72) 141/68 (03/22 0441) SpO2:  [97 %-100 %] 100 % (03/22 0441) Last BM Date: 08/07/20 General: Elderly white male in NAD Heart:  Regular rate and rhythm; no murmurs Lungs: Respirations even and unlabored, lungs CTA bilaterally Abdomen:  Soft, nontender and nondistended. Normal bowel sounds. Extremities:  Without edema. Neurologic:  Alert and oriented,  grossly normal neurologically. Psych:  Cooperative. Normal mood and affect.  Intake/Output from previous day: 03/21 0701 - 03/22 0700 In: 220 [P.O.:220] Out: -  Intake/Output this shift: No intake/output data recorded.  Lab Results: Recent Labs    08/07/20 0639 08/07/20 1628 08/08/20 0322 08/09/20 0339  WBC 7.4 6.4 6.9  --   HGB 12.7* 14.0 12.8* 12.1*  HCT 37.8* 41.7 38.4* 35.6*  PLT 209 254 214  --    BMET Recent Labs    08/08/20 0322 08/09/20 0339  NA 139 137  K 3.9 3.8  CL 106 108  CO2 24 22  GLUCOSE 162* 165*  BUN 24* 29*  CREATININE 1.99* 1.72*  CALCIUM 9.3 8.9   LFT Recent Labs    08/09/20 0339  ALBUMIN 3.6   PT/INR No results for input(s): LABPROT, INR in the last 72 hours.      Assessment / Plan:    #35 75 year old male with 47-month history of diarrhea, fecal incontinence and recent episodes of rectal bleeding this past weekend  GI path panel pertinent for rotavirus although this would not account for symptoms dating  back 3 months. Patient with previously documented right-sided AVM at prior colonoscopy.  Was felt this may be responsible for recent bleeding versus hemorrhoidal, versus other colonic or upper gut lesion.  #2 anemia-mild stable #3 peripheral vascular disease, and coronary artery disease status post multiple stents-maintained on Plavix on hold since 08/05/2020  Plan; clear liquid diet today Patient is scheduled for colonoscopy and EGD tomorrow with Dr. Havery Moros, further recommendations pending findings at endoscopic evaluation. Continue to hold Plavix      Active Problems:   GI bleeding   Antiplatelet or antithrombotic long-term use   Rectal bleeding     LOS: 3 days   Amy Esterwood PA-C 08/09/2020, 8:36 AM

## 2020-08-09 NOTE — Progress Notes (Signed)
PROGRESS NOTE  [name] RWE:[id number] DOB: Oct 16, 1945   PCP: System, Provider Not In  Patient is from: Home  DOA: 08/05/2020 LOS: 3  Chief complaints: Rectal bleed  Brief Narrative / Interim history: 75 year old male with PMH of CAD and PAD s/p multiple stents in 2014 still on DAPT, CKD-3A, dementia?,  DM-2, HTN and diverticulosis presenting with an episode of hematochezia about 9:45 PM on 08/05/2020.  Has not further rectal bleed.  He also reports 3-4 loose stools a day for about a month.  He was admitted for what looks like a diverticular bleed.  CT abdomen and pelvis positive for cholelithiasis and diverticulosis without cholecystitis or diverticulitis.  Hemoccult was negative.  Hgb 12.4>>>> 12.0 (stable).  GI panel with rotavirus.  Diarrhea seems to have resolved.  GI  planning EGD and colonoscopy on 3/23 after Plavix washout.  Last dose on 08/05/2020.   Subjective: Seen and examined earlier this morning.  No major events overnight of this morning.  No complaints.  He has not a bowel movement in the last 24 hours.  Denies abdominal pain, nausea, vomiting, chest pain or dyspnea.  Objective: Vitals:   08/08/20 1350 08/08/20 2018 08/09/20 0441 08/09/20 1333  BP: 125/72 (!) 181/71 (!) 141/68 (!) 145/67  Pulse: 63 63 (!) 59 (!) 52  Resp: _0 Temp: 97.9 F (36.6 C) 98.5 F (36.9 C) 98.6 F (37 C) 97.6 F (36.4 C)  TempSrc: Oral Oral Oral Oral  SpO2: 97% 99% 100% 100%  Weight:      Height:        Intake/Output Summary (Last 24 hours) at 08/09/2020 1457 Last data filed at 08/09/2020 0200 Gross per 24 hour  Intake 220 ml  Output --  Net 220 ml   Filed Weights   08/05/20 2253  Weight: 85.1 kg    Examination:  GENERAL: No apparent distress.  Nontoxic. HEENT: MMM.  Vision and hearing grossly intact.  NECK: Supple.  No apparent JVD.  RESP: On RA.  No IWOB.  Fair aeration bilaterally. CVS:  RRR. Heart sounds normal.  ABD/GI/GU: BS+. Abd soft, NTND.  MSK/EXT:   Moves extremities. No apparent deformity. No edema.  SKIN: no apparent skin lesion or wound NEURO: Awake, alert and oriented appropriately.  No apparent focal neuro deficit. PSYCH: Calm. Normal affect.   Procedures:  None yet.  Microbiology summarized: COVID-19 PCR pending. GI panel pending. Stool ova and cyst pending.  Assessment & Plan: Hematochezia-diverticular bleed? CT with cholelithiasis and diverticulosis without cholecystitis, diverticulitis or appendicitis.  No leukocytosis or fever.  Hemoccult negative x2.  He is on DAPT for cardiac and extremity stents.  No further hematochezia or diarrhea in the last 48 hours.  Tolerating soft diet.  H&H stable. Recent Labs    08/05/20 2320 08/06/20 0419 08/06/20 0900 08/06/20 1517 08/07/20 0639 08/07/20 1628 08/08/20 0322 08/09/20 0339  HGB 12.4* 12.1* 12.0* 13.2 12.7* 14.0 12.8* 12.1*  -EGD and colonoscopy on 3/23 -Monitor H&H -Continue holding DAPT -SCD for VTE prophylaxis  Subacute diarrhea/rotavirus infection-3-4 loose BMs daily for about 3 months. CT findings as above.  GIP with rotavirus.  Diarrhea seems to have resolved.  Only 1 BM in the last 48 hours. -Stool O/C, fecal elastase and fecal calprotectin pending -Discontinue enteric precaution  AKI on CKD-3A: Creatinine up to 1.99 after initial improvement.  Likely due to losartan. Recent Labs    08/05/20 2320 08/06/20 0419 08/08/20 0322 08/09/20 0339  BUN 24* 22 24* 29*  CREATININE 1.70* 1.56* 1.99* 1.72*  -Continue IV fluid -Continue holding losartan -Amlodipine for blood pressure control  Hypertensive urgency: Improved.  SBP in 140s. -Continue Coreg 6.25 mg twice daily -Discontinue losartan in the setting of AKI -Started amlodipine here for blood pressure -Continue as needed hydralazine  Controlled NIDDM-2 with macrovascular complication, hyperlipidemia and hyperglycemia: A1c 7.0%.  On glyburide and Metformin at home. Recent Labs  Lab 08/08/20 1206  08/08/20 1641 08/08/20 2019 08/09/20 0823 08/09/20 1154  GLUCAP 179* 124* 148* 131* 157*  -Discontinued glyburide while in-house -Continue SSI-moderate and add NovoLog 3 units 3 times daily with meals -Continue statin -Could benefit from newer agents with cardiovascular benefits  History of CAD/PVD s/p multiple stents in 2014.  On Plavix, aspirin and a statin at home.  No anginal symptoms. -Aspirin and Plavix on hold in the setting of GI bleed -Continue statin and Coreg.  GERD -Continue PPI  Dementia?  She is oriented appropriately.  Seems to be on Aricept at home. -Continue home Aricept   Insomnia -Continue melatonin  Overweight Body mass index is 27.31 kg/m.         DVT prophylaxis:  SCDs Start: 08/06/20 0232  Code Status: Full code Family Communication: Patient and RN.  None at bedside. Level of care: Med-Surg Status is: Inpatient  Remains inpatient appropriate because:Ongoing diagnostic testing needed not appropriate for outpatient work up, Unsafe d/c plan, IV treatments appropriate due to intensity of illness or inability to take PO and Inpatient level of care appropriate due to severity of illness   Dispo: The patient is from: Home              Anticipated d/c is to: Home              Patient currently is not medically stable to d/c.   Difficult to place patient No       Consultants:  Gastroenterology   Sch Meds:  Scheduled Meds:  amLODipine  10 mg Oral Daily   carvedilol  6.25 mg Oral BID WC   cholecalciferol  2,000 Units Oral See admin instructions   donepezil  10 mg Oral QHS   DULoxetine  20 mg Oral Daily   famotidine  20 mg Oral QHS   insulin aspart  0-15 Units Subcutaneous TID WC   insulin aspart  0-5 Units Subcutaneous QHS   loratadine  10 mg Oral Daily   melatonin  6 mg Oral QHS   omega-3 acid ethyl esters  1 g Oral Daily   pantoprazole  40 mg Oral Daily   peg 3350 powder  0.5 kit Oral 2 times per day   pravastatin  40  mg Oral Daily   Continuous Infusions:  sodium chloride 0 mL/hr at 08/09/20 1256   PRN Meds:.acetaminophen **OR** acetaminophen, hydrALAZINE, nitroGLYCERIN, ondansetron **OR** ondansetron (ZOFRAN) IV, polyvinyl alcohol, traZODone  Antimicrobials: Anti-infectives (From admission, onward)   None       I have personally reviewed the following labs and images: CBC: Recent Labs  Lab 08/05/20 2320 08/06/20 0419 08/06/20 0900 08/06/20 1517 08/07/20 0639 08/07/20 1628 08/08/20 0322 08/09/20 0339  WBC 5.6 5.9  --  5.6 7.4 6.4 6.9  --   NEUTROABS 2.8  --   --   --   --   --   --   --   HGB 12.4* 12.1*   < > 13.2 12.7* 14.0 12.8* 12.1*  HCT 36.6* 35.6*   < > 39.3 37.8* 41.7 38.4* 35.6*  MCV 92.2  92.7  --  93.1 92.6 93.3 93.9  --   PLT 215 200  --  249 209 254 214  --    < > = values in this interval not displayed.   BMP &GFR Recent Labs  Lab 08/05/20 2320 08/06/20 0419 08/08/20 0322 08/09/20 0339  NA 138 140 139 137  K 4.2 3.6 3.9 3.8  CL 106 107 106 108  CO2 _0 GLUCOSE 146* 144* 162* 165*  BUN 24* 22 24* 29*  CREATININE 1.70* 1.56* 1.99* 1.72*  CALCIUM 9.3 9.1 9.3 8.9  MG  --   --  2.0 2.0  PHOS  --   --  4.0 3.7   Estimated Creatinine Clearance: 36.6 mL/min (A) (by C-G formula based on SCr of 1.72 mg/dL (H)). Liver & Pancreas: Recent Labs  Lab 08/08/20 0322 08/09/20 0339  ALBUMIN 3.8 3.6   No results for input(s): LIPASE, AMYLASE in the last 168 hours. No results for input(s): AMMONIA in the last 168 hours. Diabetic: No results for input(s): HGBA1C in the last 72 hours. Recent Labs  Lab 08/08/20 1206 08/08/20 1641 08/08/20 2019 08/09/20 0823 08/09/20 1154  GLUCAP 179* 124* 148* 131* 157*   Cardiac Enzymes: No results for input(s): CKTOTAL, CKMB, CKMBINDEX, TROPONINI in the last 168 hours. No results for input(s): PROBNP in the last 8760 hours. Coagulation Profile: No results for input(s): INR, PROTIME in the last 168 hours. Thyroid  Function Tests: Recent Labs    08/07/20 0800  TSH 0.599   Lipid Profile: No results for input(s): CHOL, HDL, LDLCALC, TRIG, CHOLHDL, LDLDIRECT in the last 72 hours. Anemia Panel: No results for input(s): VITAMINB12, FOLATE, FERRITIN, TIBC, IRON, RETICCTPCT in the last 72 hours. Urine analysis:    Component Value Date/Time   COLORURINE YELLOW 07/30/2014 1951   APPEARANCEUR CLEAR 07/30/2014 1951   LABSPEC 1.012 07/30/2014 1951   PHURINE 5.0 07/30/2014 1951   GLUCOSEU NEGATIVE 07/30/2014 1951   HGBUR NEGATIVE 07/30/2014 1951   BILIRUBINUR NEGATIVE 07/30/2014 [place] NEGATIVE 07/30/2014 1951   PROTEINUR 30 (A) 07/30/2014 1951   UROBILINOGEN 0.2 07/30/2014 1951   NITRITE NEGATIVE 07/30/2014 1951   LEUKOCYTESUR NEGATIVE 07/30/2014 1951   Sepsis Labs: Invalid input(s): PROCALCITONIN, [place]  Microbiology: Recent Results (from the past 240 hour(s))  SARS CORONAVIRUS 2 (TAT 6-24 HRS) Nasopharyngeal Nasopharyngeal Swab     Status: None   Collection Time: 08/06/20  1:37 AM   Specimen: Nasopharyngeal Swab  Result Value Ref Range Status   SARS Coronavirus 2 NEGATIVE NEGATIVE Final    Comment: (NOTE) SARS-CoV-2 target nucleic acids are NOT DETECTED.  The SARS-CoV-2 RNA is generally detectable in upper and lower respiratory specimens during the acute phase of infection. Negative results do not preclude SARS-CoV-2 infection, do not rule out co-infections with other pathogens, and should not be used as the sole basis for treatment or other patient management decisions. Negative results must be combined with clinical observations, patient history, and epidemiological information. The expected result is Negative.  Fact Sheet for Patients: SugarRoll.be  Fact Sheet for Healthcare Providers: https://www.woods-mathews.com/  This test is not yet approved or cleared by the [place] FDA and  has been authorized for detection  and/or diagnosis of SARS-CoV-2 by FDA under an Emergency Use Authorization (EUA). This EUA will remain  in effect (meaning this test can be used) for the duration of the COVID-19 declaration under Se ction 564(b)(1) of the Act, 21 U.S.C. section 360bbb-3(b)(1), unless the authorization  is terminated or revoked sooner.  Performed at [facility], [place] [address], [place], [place] [address]   Gastrointestinal Panel by PCR , Stool     Status: Abnormal   Collection Time: 08/06/20  4:31 PM   Specimen: STOOL  Result Value Ref Range Status   Campylobacter species NOT DETECTED NOT DETECTED Final   Plesimonas shigelloides NOT DETECTED NOT DETECTED Final   Salmonella species NOT DETECTED NOT DETECTED Final   Yersinia enterocolitica NOT DETECTED NOT DETECTED Final   Vibrio species NOT DETECTED NOT DETECTED Final   Vibrio cholerae NOT DETECTED NOT DETECTED Final   Enteroaggregative E coli (EAEC) NOT DETECTED NOT DETECTED Final   Enteropathogenic E coli (EPEC) NOT DETECTED NOT DETECTED Final   Enterotoxigenic E coli (ETEC) NOT DETECTED NOT DETECTED Final   Shiga like toxin producing E coli (STEC) NOT DETECTED NOT DETECTED Final   Shigella/Enteroinvasive E coli (EIEC) NOT DETECTED NOT DETECTED Final   Cryptosporidium NOT DETECTED NOT DETECTED Final   Cyclospora cayetanensis NOT DETECTED NOT DETECTED Final   Entamoeba histolytica NOT DETECTED NOT DETECTED Final   Giardia lamblia NOT DETECTED NOT DETECTED Final   Adenovirus F40/41 NOT DETECTED NOT DETECTED Final   Astrovirus NOT DETECTED NOT DETECTED Final   Norovirus GI/GII NOT DETECTED NOT DETECTED Final   Rotavirus A DETECTED (A) NOT DETECTED Final   Sapovirus (I, II, IV, and V) NOT DETECTED NOT DETECTED Final    Comment: Performed at [facility], [address] [place], [place] [address]    Radiology Studies: No results found.    [name] T. [place]  If 7PM-7AM, please contact  night-coverage [email or web address] 08/09/2020, 2:57 PM

## 2020-08-10 ENCOUNTER — Inpatient Hospital Stay (HOSPITAL_COMMUNITY): Payer: No Typology Code available for payment source | Admitting: Anesthesiology

## 2020-08-10 ENCOUNTER — Encounter (HOSPITAL_COMMUNITY): Payer: Self-pay | Admitting: Family Medicine

## 2020-08-10 ENCOUNTER — Encounter (HOSPITAL_COMMUNITY)
Admission: EM | Disposition: A | Discharge status: Home / Self Care | Payer: Self-pay | Source: Home / Self Care | Attending: Student

## 2020-08-10 DIAGNOSIS — A080 Rotaviral enteritis: Secondary | ICD-10-CM | POA: Diagnosis not present

## 2020-08-10 DIAGNOSIS — K922 Gastrointestinal hemorrhage, unspecified: Secondary | ICD-10-CM | POA: Diagnosis not present

## 2020-08-10 DIAGNOSIS — K5520 Angiodysplasia of colon without hemorrhage: Secondary | ICD-10-CM

## 2020-08-10 DIAGNOSIS — K625 Hemorrhage of anus and rectum: Secondary | ICD-10-CM | POA: Diagnosis not present

## 2020-08-10 DIAGNOSIS — Z7902 Long term (current) use of antithrombotics/antiplatelets: Secondary | ICD-10-CM | POA: Diagnosis not present

## 2020-08-10 HISTORY — PX: HEMOSTASIS CLIP PLACEMENT: SHX6857

## 2020-08-10 HISTORY — PX: BIOPSY: SHX5522

## 2020-08-10 HISTORY — PX: COLONOSCOPY WITH PROPOFOL: SHX5780

## 2020-08-10 HISTORY — PX: ESOPHAGOGASTRODUODENOSCOPY (EGD) WITH PROPOFOL: SHX5813

## 2020-08-10 HISTORY — PX: HOT HEMOSTASIS: SHX5433

## 2020-08-10 LAB — RENAL FUNCTION PANEL
Albumin: 4 g/dL (ref 3.5–5.0)
Anion gap: 10 (ref 5–15)
BUN: 22 mg/dL (ref 8–23)
CO2: 19 mmol/L — ABNORMAL LOW (ref 22–32)
Calcium: 8.9 mg/dL (ref 8.9–10.3)
Chloride: 111 mmol/L (ref 98–111)
Creatinine, Ser: 1.51 mg/dL — ABNORMAL HIGH (ref 0.61–1.24)
GFR, Estimated: 47 mL/min — ABNORMAL LOW (ref >60)
Glucose, Bld: 117 mg/dL — ABNORMAL HIGH (ref 70–99)
Phosphorus: 2.7 mg/dL (ref 2.5–4.6)
Potassium: 3.8 mmol/L (ref 3.5–5.1)
Sodium: 140 mmol/L (ref 135–145)

## 2020-08-10 LAB — CBC
HCT: 38.7 % — ABNORMAL LOW (ref 39.0–52.0)
Hemoglobin: 12.9 g/dL — ABNORMAL LOW (ref 13.0–17.0)
MCH: 31.2 pg (ref 26.0–34.0)
MCHC: 33.3 g/dL (ref 30.0–36.0)
MCV: 93.7 fL (ref 80.0–100.0)
Platelets: 209 10*3/uL (ref 150–400)
RBC: 4.13 MIL/uL — ABNORMAL LOW (ref 4.22–5.81)
RDW: 13.2 % (ref 11.5–15.5)
WBC: 6.4 10*3/uL (ref 4.0–10.5)
nRBC: 0 % (ref 0.0–0.2)

## 2020-08-10 LAB — OVA + PARASITE EXAM

## 2020-08-10 LAB — GLUCOSE, CAPILLARY
Glucose-Capillary: 95 mg/dL (ref 70–99)
Glucose-Capillary: 127 mg/dL — ABNORMAL HIGH (ref 70–99)

## 2020-08-10 LAB — CALPROTECTIN, FECAL: Calprotectin, Fecal: 31 ug/g (ref 0–120)

## 2020-08-10 LAB — MAGNESIUM: Magnesium: 2.1 mg/dL (ref 1.7–2.4)

## 2020-08-10 LAB — O&P RESULT

## 2020-08-10 SURGERY — ESOPHAGOGASTRODUODENOSCOPY (EGD) WITH PROPOFOL
Anesthesia: Monitor Anesthesia Care

## 2020-08-10 MED ORDER — PROPOFOL 500 MG/50ML IV EMUL
INTRAVENOUS | Status: DC | PRN
  Administered 2020-08-10: 135 ug/kg/min via INTRAVENOUS

## 2020-08-10 MED ORDER — CLOPIDOGREL BISULFATE 75 MG PO TABS: 75.0000 mg | ORAL_TABLET | Freq: Every day | ORAL | Status: AC

## 2020-08-10 MED ORDER — ONDANSETRON HCL 4 MG/2ML IJ SOLN
INTRAMUSCULAR | Status: DC | PRN
  Administered 2020-08-10: 4 mg via INTRAVENOUS

## 2020-08-10 MED ORDER — PROPOFOL 500 MG/50ML IV EMUL
INTRAVENOUS | Status: DC | PRN
  Administered 2020-08-10: 40 mg via INTRAVENOUS

## 2020-08-10 MED ORDER — LOSARTAN POTASSIUM 25 MG PO TABS: 25.0000 mg | ORAL_TABLET | Freq: Every day | ORAL | 0 refills | Status: DC

## 2020-08-10 MED ORDER — AMLODIPINE BESYLATE 10 MG PO TABS: 10.0000 mg | ORAL_TABLET | Freq: Every day | ORAL | 0 refills | Status: DC

## 2020-08-10 MED ORDER — ONDANSETRON HCL 4 MG PO TABS: 4.0000 mg | ORAL_TABLET | Freq: Four times a day (QID) | ORAL | 0 refills | Status: DC | PRN

## 2020-08-10 MED ORDER — LACTATED RINGERS IV SOLN
INTRAVENOUS | Status: AC | PRN
  Administered 2020-08-10: 1000 mL via INTRAVENOUS

## 2020-08-10 SURGICAL SUPPLY — 25 items

## 2020-08-10 NOTE — Transfer of Care (Signed)
Immediate Anesthesia Transfer of Care Note  Patient: [name]  Procedure(s) Performed: Procedure(s): ESOPHAGOGASTRODUODENOSCOPY (EGD) WITH PROPOFOL (N/A) COLONOSCOPY WITH PROPOFOL (N/A) BIOPSY HOT HEMOSTASIS (ARGON PLASMA COAGULATION/BICAP) (N/A) HEMOSTASIS CLIP PLACEMENT  Patient Location: PACU  Anesthesia Type:MAC  Level of Consciousness:  sedated, patient cooperative and responds to stimulation  Airway & Oxygen Therapy:Patient Spontanous Breathing and Patient connected to face mask oxgen  Post-op Assessment:  Report given to PACU RN and Post -op Vital signs reviewed and stable  Post vital signs:  Reviewed and stable  Last Vitals:  Vitals:   08/10/20 0407 08/10/20 0823  BP: (!) 146/72 (!) 186/66  Pulse: (!) 56 (!) 54  Resp: 18 15  Temp: 36.6 C 36.7 C  SpO2: 22% 449%    Complications: No apparent anesthesia complications

## 2020-08-10 NOTE — Progress Notes (Signed)
PT Cancellation Note  Patient Details Name: DARROLL BREDESON MRN: [id number] DOB: 04-06-1946   Cancelled Treatment:    Reason Eval/Treat Not Completed: PT screened, no needs identified, will sign off Spoke with pt, pt reports he is at baseline, mobilizing 2 laps on unit independently and adjusting his own RW for proper height. No acute PT needs identified.   Elna Breslow 08/10/2020, 3:38 PM

## 2020-08-10 NOTE — Interval H&P Note (Signed)
History and Physical Interval Note: No changes since I saw him yesterday. Have discussed risks / benefits of the exam and anesthesia. 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 He understands and wishes to proceed. Further recommendations pending the results.    08/10/2020 8:41 AM  [name]  has presented today for surgery, with the diagnosis of chronic diarrhea , rectal bleeding.  The various methods of treatment have been discussed with the patient and family. After consideration of risks, benefits and other options for treatment, the patient has consented to  Procedure(s): ESOPHAGOGASTRODUODENOSCOPY (EGD) WITH PROPOFOL (N/A) COLONOSCOPY WITH PROPOFOL (N/A) as a surgical intervention.  The patient's history has been reviewed, patient examined, no change in status, stable for surgery.  I have reviewed the patient's chart and labs.  Questions were answered to the patient's satisfaction.     [place]

## 2020-08-10 NOTE — Op Note (Signed)
[facility] Patient Name: [name] Procedure Date: 08/10/2020 MRN: [id number] Attending MD: Carlota Raspberry. Havery Moros , MD Date of Birth: 11/04/1945 CSN: [id number] Age: 75 Admit Type: Inpatient Procedure:                Upper GI endoscopy Indications:              Severe chronic diarrhea, rectal bleeding - rule out                            small bowel enteropathy. On plavix, held for 5 days Providers:                Remo Lipps P. Havery Moros, MD, Cleda Daub, RN, Arnoldo Hooker, CRNA, Tyrone Apple, Technician Referring MD:              Medicines:                Monitored Anesthesia Care Complications:            No immediate complications. Estimated blood loss:                            Minimal. Estimated Blood Loss:     Estimated blood loss was minimal. Procedure:                Pre-Anesthesia Assessment:                           - Prior to the procedure, a History and Physical                            was performed, and patient medications and                            allergies were reviewed. The patient's tolerance of                            previous anesthesia was also reviewed. The risks                            and benefits of the procedure and the sedation                            options and risks were discussed with the patient.                            All questions were answered, and informed consent                            was obtained. Prior Anticoagulants: The patient has                            taken Plavix (clopidogrel), last dose was 5 days  prior to procedure. ASA Grade Assessment: III - A                            patient with severe systemic disease. After                            reviewing the risks and benefits, the patient was                            deemed in satisfactory condition to undergo the                            procedure.                           After  obtaining informed consent, the endoscope was                            passed under direct vision. Throughout the                            procedure, the patient's blood pressure, pulse, and                            oxygen saturations were monitored continuously. The                            [id number]-[id number] ([id number] Olympus gastroscope was                            introduced through the mouth, and advanced to the                            second part of duodenum. The upper GI endoscopy was                            accomplished without difficulty. The patient                            tolerated the procedure well. Scope In: Scope Out: Findings:      Esophagogastric landmarks were identified: the Z-line was found at 39       cm, the gastroesophageal junction was found at 39 cm and the upper       extent of the gastric folds was found at 40 cm from the incisors.      A 1 cm hiatal hernia was present.      The exam of the esophagus was otherwise normal.      The entire examined stomach was normal.      The duodenal bulb and second portion of the duodenum were normal.       Biopsies for histology were taken with a cold forceps for evaluation of       celiac disease. Impression:               - Esophagogastric landmarks identified.                           -  Normal esophagus otherwise                           - 1 cm hiatal hernia.                           - Normal stomach.                           - Normal duodenal bulb and second portion of the                            duodenum. Biopsied to rule out celiac disease. Moderate Sedation:      No moderate sedation, case performed with MAC Recommendation:           - Return patient to hospital ward for ongoing care.                           - Regular diet as tolerated.                           - Continue present medications.                           - Resume Plavix per colonoscopy note, tomorrow                           -  Await pathology results.                           - Full recommendations in colonoscopy note Procedure Code(s):        --- Professional ---                           [phone], Esophagogastroduodenoscopy, flexible,                            transoral; with biopsy, single or multiple Diagnosis Code(s):        --- Professional ---                           K44.9, Diaphragmatic hernia without obstruction or                            gangrene                           R19.7, Diarrhea, unspecified CPT copyright 2019 American Medical Association. All rights reserved. The codes documented in this report are preliminary and upon coder review may  be revised to meet current compliance requirements. Remo Lipps P. Havery Moros, MD 08/10/2020 9:55:14 AM This report has been signed electronically. Number of Addenda: 0

## 2020-08-10 NOTE — Discharge Summary (Signed)
Physician Discharge Summary  [name] IRS:[id number] DOB: [birth year] DOA: 08/05/2020  PCP: System, Provider Not In  Admit date: 08/05/2020 Discharge date: 08/10/2020  Admitted From: Home Disposition: Home  Recommendations for Outpatient Follow-up:  1. Follow up with PCP in 1-2 weeks 2. Follow-up with gastroenterology within 1 to 2 weeks 3. Follow-up with cardiology/vascular surgery within 1 to 2 weeks 4. Resume Plavix on 08/11/2020; defer to outpatient Providers to resume aspirin 5. Please obtain CMP/CBC, Mag, Phos in one week 6. Please follow up on the following pending results: Biopsies from colonoscopy and EGD  Home Health: No Equipment/Devices: None  Discharge Condition: Stable CODE STATUS: FULL CODE Diet recommendation: Heart Healthy Carb Modified   Brief/Interim Summary: The patient is a 75 year old Caucasian male with a past medical history significant for but not limited to CAD and PAD status post multiple stents in 2017 with still on dual antiplatelet therapy, CKD stage IIIa, history of questionable dementia, diabetes mellitus type 2, hypertension and diverticulosis who presented with an episode of hematochezia about 9:45 PM on 08/05/2020.  He had no further rectal bleeding but he has been reporting that he has been having 3-4 loose stools per day for about a month.  He was admitted for what looks like a diverticular bleed and CT of the abdomen pelvis was positive for cholelithiasis and diverticulosis without any cholecystitis or diverticulitis.  Hemoccult was negative but his hemoglobin had dropped from 12.4-12.0 and was stable.  He had a GI pathogen panel done for his diarrhea and it was positive for rotavirus A.  Diarrhea improved and resolved.  GI was consulted and they evaluated and he was held off with his dual antiplatelet therapy and underwent an EGD and colonoscopy on 323 after Plavix washout.  GI recommending resuming Plavix 08/12/2022 and after his EGD and colon results.   Colonoscopy showed he had some angiodysplastic lesions which were treated and he also had some biopsies done.  Will need to follow-up with GI in outpatient setting as well as cardiology as he has been deemed to be stable for discharge now.  Discharge Diagnoses:  Active Problems:   GI bleeding   Antiplatelet or antithrombotic long-term use   Rectal bleeding  Hematochezia-diverticular bleed? CT with cholelithiasis and diverticulosis without cholecystitis, diverticulitis or appendicitis.  No leukocytosis or fever.  Hemoccult negative x2.  He is on DAPT for cardiac and extremity stents.  No further hematochezia or diarrhea in the last 48 hours.  Tolerating soft diet.  H&H stable. Recent Labs (within last 365 days)            Recent Labs    08/05/20 2320 08/06/20 0419 08/06/20 0900 08/06/20 1517 08/07/20 0639 08/07/20 1628 08/08/20 0322 08/09/20 0339  HGB 12.4* 12.1* 12.0* 13.2 12.7* 14.0 12.8* 12.1*    -EGD and colonoscopy on 3/23 -Monitor H&H -Continue holding DAPT and he underwent a EGD and colonoscopy as below; the patient had some hemorrhoid that were found on the perianal exam and that he had a small cluster of angiodysplastic lesions without bleeding in the cecum treated with APC and a single large angiodysplastic lesion without bleeding in the ascending colon treated with APC.  Patient also had some multiple small mouth diverticula in the sigmoid colon and internal hemorrhoids -EGD showed that the he had normal esophagus, 1 cm hiatal hernia, normal stomach and normal duodenal bulb and normal second portion of the duodenum; these were all biopsied to rule out celiac disease -hemoglobin/hematocrit is now stable at 12.9/38.7 -  SCD for VTE prophylaxis; resume Plavix 08/11/2020 and diet was advanced back to his normal diet and tolerated well  Subacute diarrhea/rotavirus infection-3-4 loose BMs daily for about 3 months. CT findings as above.  GIP with rotavirus.  Diarrhea seems to have  resolved.  Only 1 BM in the last 48 hours. -Stool O/C, fecal elastase and fecal calprotectin resulted -Discontinue enteric precaution -Follow-up in outpatient setting with GI  AKI on CKD-3A: Creatinine up to 1.99 after initial improvement.  Likely due to losartan. Metabolic acidosis Recent Labs (within last 365 days)        Recent Labs    08/05/20 2320 08/06/20 0419 08/08/20 0322 08/09/20 0339  BUN 24* 22 24* 29*  CREATININE 1.70* 1.56* 1.99* 1.72*    -Continue IV fluid prior to him being discharged -Patient had a mild metabolic acidosis with a CO2 of 19, anion gap 10, chloride level 111 -Continue holding losartan as initially but can be discharged with 25 mg of losartan given that his BUN/creatinine is now 22/1.51 on the day of discharge -Amlodipine for blood pressure control  Hypertensive urgency: Improved.  SBP in 140s. -Continue Coreg 6.25 mg twice daily while hospitalized but cut back to 3.125 -Discontinue losartan in the setting of AKI but will reduce the dose at discharge to 25 mg daily and continue with amlodipine 10 mg daily at discharge -Started amlodipine here for blood pressure -Continue as needed hydralazine -Follow-up outpatient for continued blood pressure medication adjustments  Controlled NIDDM-2 with macrovascular complication, hyperlipidemia and hyperglycemia: A1c 7.0%.  On glyburide and Metformin at home. Last Labs          Recent Labs  Lab 08/08/20 1206 08/08/20 1641 08/08/20 2019 08/09/20 0823 08/09/20 1154  GLUCAP 179* 124* 148* 131* 157*    -Discontinued glyburide while in-house and resume at discharge -Continue SSI-moderate and add NovoLog 3 units 3 times daily with meals -Continue statin -CBGs ranging from 95-157 -Could benefit from newer agents with cardiovascular benefits but will defer to his outpatient providers  History of CAD/PVD s/p multiple stents in 2014.  On Plavix, aspirin and a statin at home.  No anginal symptoms. -Aspirin  and Plavix on hold in the setting of GI bleed and will discontinue aspirin at discharge and defer to outpatient providers resume but patient can resume Plavix on 08/11/2020 after discussion with GI -Continue statin and Coreg.  GERD -Continue PPI at discharge  Dementia?  He is oriented appropriately.  Seems to be on Aricept at home. -Continue home Aricept   Insomnia -Continue melatonin at discharge   Overweight Body mass index is 27.31 kg/m.   Discharge Instructions  Discharge Instructions    Call MD for:  difficulty breathing, headache or visual disturbances   Complete by: As directed    Call MD for:  extreme fatigue   Complete by: As directed    Call MD for:  hives   Complete by: As directed    Call MD for:  persistant dizziness or light-headedness   Complete by: As directed    Call MD for:  persistant nausea and vomiting   Complete by: As directed    Call MD for:  redness, tenderness, or signs of infection (pain, swelling, redness, odor or green/yellow discharge around incision site)   Complete by: As directed    Call MD for:  severe uncontrolled pain   Complete by: As directed    Call MD for:  temperature >100.4   Complete by: As directed    Diet -  low sodium heart healthy   Complete by: As directed    Diet Carb Modified   Complete by: As directed    Discharge instructions   Complete by: As directed    You were cared for by a hospitalist during your hospital stay. If you have any questions about your discharge medications or the care you received while you were in the hospital after you are discharged, you can call the unit and ask to speak with the hospitalist on call if the hospitalist that took care of you is not available. Once you are discharged, your primary care physician will handle any further medical issues. Please note that NO REFILLS for any discharge medications will be authorized once you are discharged, as it is imperative that you return to your primary  care physician (or establish a relationship with a primary care physician if you do not have one) for your aftercare needs so that they can reassess your need for medications and monitor your lab values.  Follow up with PCP, Cardiology and Vascular Surgery, and Gastroenterology as an outpatient. Take all medications as prescribed. If symptoms change or worsen please return to the ED for evaluation   Increase activity slowly   Complete by: As directed      Allergies as of 08/10/2020      Reactions   Contrast Media [iodinated Diagnostic Agents] Nausea And Vomiting   Quinine Derivatives Other (See Comments)   Pt does not know      Medication List    STOP taking these medications   aspirin 81 MG chewable tablet     TAKE these medications   acetaminophen 500 MG tablet Commonly known as: TYLENOL Take 2 tablets (1,000 mg total) by mouth every 6 (six) hours as needed. What changed: reasons to take this   amLODipine 10 MG tablet Commonly known as: NORVASC Take 1 tablet (10 mg total) by mouth daily.   carvedilol 3.125 MG tablet Commonly known as: COREG Take 3.125 mg by mouth 2 (two) times daily with a meal.   cetirizine 10 MG tablet Commonly known as: ZYRTEC Take 10 mg by mouth daily.   cholecalciferol 25 MCG (1000 UNIT) tablet Commonly known as: VITAMIN D3 Take 2,000 Units by mouth See admin instructions. Every other week   clopidogrel 75 MG tablet Commonly known as: PLAVIX Take 1 tablet (75 mg total) by mouth daily. Start taking on: August 11, 2020   donepezil 5 MG tablet Commonly known as: ARICEPT Take 10 mg by mouth at bedtime.   DULoxetine 20 MG capsule Commonly known as: CYMBALTA Take 20 mg by mouth daily.   famotidine 20 MG tablet Commonly known as: PEPCID Take 20 mg by mouth at bedtime.   Fish Oil 1200 MG Cpdr Take 1,200 mg by mouth daily.   glyBURIDE 2.5 MG tablet Commonly known as: DIABETA Take 2.5 mg by mouth daily with breakfast.   LACTOBACILLUS  PROBIOTIC PO Take by mouth.   losartan 25 MG tablet Commonly known as: COZAAR Take 1 tablet (25 mg total) by mouth daily. What changed:   medication strength  how much to take   metFORMIN 1000 MG tablet Commonly known as: GLUCOPHAGE Take 1,000 mg by mouth daily.   nitroGLYCERIN 0.4 MG SL tablet Commonly known as: NITROSTAT Place 0.4 mg under the tongue every 5 (five) minutes as needed for chest pain.   ondansetron 4 MG tablet Commonly known as: ZOFRAN Take 1 tablet (4 mg total) by mouth every 6 (six) hours  as needed for nausea.   pantoprazole 40 MG tablet Commonly known as: PROTONIX Take 40 mg by mouth daily.   polyvinyl alcohol 1.4 % ophthalmic solution Commonly known as: LIQUIFILM TEARS Place 1 drop into both eyes 2 (two) times daily as needed for dry eyes.   pravastatin 40 MG tablet Commonly known as: PRAVACHOL Take 40 mg by mouth daily.       Allergies  Allergen Reactions  . Contrast Media [Iodinated Diagnostic Agents] Nausea And Vomiting  . Quinine Derivatives Other (See Comments)    Pt does not know    Consultations:  Gastroenterology  Procedures/Studies: CT ABDOMEN PELVIS WO CONTRAST  Result Date: 08/06/2020 CLINICAL DATA:  75 year old male with left lower quadrant abdominal pain. Blood in stool yesterday. EXAM: CT ABDOMEN AND PELVIS WITHOUT CONTRAST TECHNIQUE: Multidetector CT imaging of the abdomen and pelvis was performed following the standard protocol without IV contrast. COMPARISON:  CT Abdomen and Pelvis 07/04/2010. FINDINGS: Lower chest: Calcified coronary artery atherosclerosis. Calcified aortic atherosclerosis. No cardiomegaly or pericardial effusion. Chronic lung base scarring appears progressed since 2012 and greater on the right. No pleural effusion. Hepatobiliary: Chronic hepatic steatosis. Enlarged chronic gallstone, now 15 mm diameter. But there is no pericholecystic inflammation. Pancreas: Negative. Spleen: Negative. Adrenals/Urinary Tract:  Normal adrenal glands. Chronic left renal exophytic cyst appears stable and benign. No hydronephrosis. No nephrolithiasis. Some renal vascular calcifications are noted. Both ureters are decompressed and negative. The bladder is mildly distended but otherwise unremarkable. Stomach/Bowel: Redundant sigmoid colon containing gas with diverticula. No active inflammation. Mild retained stool mixed with hyperdense material in the descending and upstream large bowel loops. Occasional diverticula there without inflammation. Normal appendix arising from the cecum on series 2, image 63. Negative terminal ileum. No dilated small bowel. Decompressed stomach and duodenum. No free air or free fluid. Vascular/Lymphatic: Extensive Aortoiliac calcified atherosclerosis. Mildly tortuous abdominal aorta. Vascular patency is not evaluated in the absence of IV contrast. Reproductive: Negative. Other: No pelvic free fluid. Musculoskeletal: Chronic thoracic endplate degeneration with some levels of flowing endplate osteophytes and ankylosis. Advanced lower lumbar facet degeneration, and disc degeneration at the lumbosacral junction. No acute osseous abnormality identified. IMPRESSION: 1. No acute or inflammatory process identified in the non-contrast abdomen or pelvis: Progressed cholelithiasis since 2012 but no CT evidence of acute cholecystitis. Diverticulosis of the large bowel without active inflammation. Chronic hepatic steatosis. Cholelithiasis. 2. Progressed lung base scarring since 2012. 3. Aortic Atherosclerosis (ICD10-I70.0). Electronically Signed   By: Genevie Ann M.D.   On: 08/06/2020 08:27     Subjective: Seen and examined at bedside after his EGD and colonoscopy and he felt well.  Had no nausea or vomiting.  Denies any lightheadedness or dizziness.  Felt well and ready to go home.  Discharge Exam: Vitals:   08/10/20 1028 08/10/20 1316  BP: (!) 167/67 (!) 164/66  Pulse: (!) 54 (!) 56  Resp: 16 16  Temp: 98.1 F (36.7  C) 98.1 F (36.7 C)  SpO2: 98% 99%   Vitals:   08/10/20 1005 08/10/20 1010 08/10/20 1028 08/10/20 1316  BP:  (!) 155/69 (!) 167/67 (!) 164/66  Pulse: (!) 55 (!) 55 (!) 54 (!) 56  Resp: 12 12 16 16   Temp:   98.1 F (36.7 C) 98.1 F (36.7 C)  TempSrc:   Oral Oral  SpO2: 99% 100% 98% 99%  Weight:      Height:       General: Pt is alert, awake, not in acute distress Cardiovascular: RRR, S1/S2 +,  no rubs, no gallops Respiratory: Diminished bilaterally, no wheezing, no rhonchi Abdominal: Soft, NT, distended secondary body mass, bowel sounds + Extremities: no edema, no cyanosis  The results of significant diagnostics from this hospitalization (including imaging, microbiology, ancillary and laboratory) are listed below for reference.    Microbiology: Recent Results (from the past 240 hour(s))  SARS CORONAVIRUS 2 (TAT 6-24 HRS) Nasopharyngeal Nasopharyngeal Swab     Status: None   Collection Time: 08/06/20  1:37 AM   Specimen: Nasopharyngeal Swab  Result Value Ref Range Status   SARS Coronavirus 2 NEGATIVE NEGATIVE Final    Comment: (NOTE) SARS-CoV-2 target nucleic acids are NOT DETECTED.  The SARS-CoV-2 RNA is generally detectable in upper and lower respiratory specimens during the acute phase of infection. Negative results do not preclude SARS-CoV-2 infection, do not rule out co-infections with other pathogens, and should not be used as the sole basis for treatment or other patient management decisions. Negative results must be combined with clinical observations, patient history, and epidemiological information. The expected result is Negative.  Fact Sheet for Patients: SugarRoll.be  Fact Sheet for Healthcare Providers: https://www.woods-mathews.com/  This test is not yet approved or cleared by the [place] FDA and  has been authorized for detection and/or diagnosis of SARS-CoV-2 by FDA under an Emergency Use Authorization  (EUA). This EUA will remain  in effect (meaning this test can be used) for the duration of the COVID-19 declaration under Se ction 564(b)(1) of the Act, 21 U.S.C. section 360bbb-3(b)(1), unless the authorization is terminated or revoked sooner.  Performed at [facility], [place] [address], [place], [place] [address]   OVA + PARASITE EXAM     Status: None   Collection Time: 08/06/20  4:31 PM   Specimen: STOOL  Result Value Ref Range Status   OVA + PARASITE EXAM Final report  Final    Comment: (NOTE) These results were obtained using wet preparation(s) and trichrome stained smear. This test does not include testing for Cryptosporidium parvum, Cyclospora, or Microsporidia. Performed At: [facility] [place], [place] [id number] Truddie Coco MD EX:[id number]    Source of Sample STOOL  Final    Comment: Performed at [facility] [address] [place], [place] [address]  Gastrointestinal Panel by PCR , Stool     Status: Abnormal   Collection Time: 08/06/20  4:31 PM   Specimen: STOOL  Result Value Ref Range Status   Campylobacter species NOT DETECTED NOT DETECTED Final   Plesimonas shigelloides NOT DETECTED NOT DETECTED Final   Salmonella species NOT DETECTED NOT DETECTED Final   Yersinia enterocolitica NOT DETECTED NOT DETECTED Final   Vibrio species NOT DETECTED NOT DETECTED Final   Vibrio cholerae NOT DETECTED NOT DETECTED Final   Enteroaggregative E coli (EAEC) NOT DETECTED NOT DETECTED Final   Enteropathogenic E coli (EPEC) NOT DETECTED NOT DETECTED Final   Enterotoxigenic E coli (ETEC) NOT DETECTED NOT DETECTED Final   Shiga like toxin producing E coli (STEC) NOT DETECTED NOT DETECTED Final   Shigella/Enteroinvasive E coli (EIEC) NOT DETECTED NOT DETECTED Final   Cryptosporidium NOT DETECTED NOT DETECTED Final   Cyclospora cayetanensis NOT DETECTED NOT DETECTED Final   Entamoeba histolytica NOT DETECTED NOT DETECTED Final    Giardia lamblia NOT DETECTED NOT DETECTED Final   Adenovirus F40/41 NOT DETECTED NOT DETECTED Final   Astrovirus NOT DETECTED NOT DETECTED Final   Norovirus GI/GII NOT DETECTED NOT DETECTED Final   Rotavirus A DETECTED (  A) NOT DETECTED Final   Sapovirus (I, II, IV, and V) NOT DETECTED NOT DETECTED Final    Comment: Performed at [facility], [address] [place], [place] [address]  Calprotectin, Fecal     Status: None   Collection Time: 08/06/20  4:31 PM   Specimen: STOOL  Result Value Ref Range Status   Calprotectin, Fecal 31 0 - 120 ug/g Final    Comment: (NOTE) Concentration     Interpretation   Follow-Up <16 - 50 ug/g     Normal           None >50 -120 ug/g     Borderline       Re-evaluate in 4-6 weeks    >120 ug/g     Abnormal         Repeat as clinically                                   indicated Performed At: [facility] [place], [place] [id number] Rush Farmer MD GM:[id number]     Labs: BNP (last 3 results) No results for input(s): BNP in the last 8760 hours. Basic Metabolic Panel: Recent Labs  Lab 08/05/20 2320 08/06/20 0419 08/08/20 0322 08/09/20 0339 08/10/20 0351  NA 138 140 139 137 140  K 4.2 3.6 3.9 3.8 3.8  CL 106 107 106 108 111  CO2 23 23 24 22  19*  GLUCOSE 146* 144* 162* 165* 117*  BUN 24* 22 24* 29* 22  CREATININE 1.70* 1.56* 1.99* 1.72* 1.51*  CALCIUM 9.3 9.1 9.3 8.9 8.9  MG  --   --  2.0 2.0 2.1  PHOS  --   --  4.0 3.7 2.7   Liver Function Tests: Recent Labs  Lab 08/08/20 0322 08/09/20 0339 08/10/20 0351  ALBUMIN 3.8 3.6 4.0   No results for input(s): LIPASE, AMYLASE in the last 168 hours. No results for input(s): AMMONIA in the last 168 hours. CBC: Recent Labs  Lab 08/05/20 2320 08/06/20 0419 08/06/20 1517 08/07/20 0639 08/07/20 1628 08/08/20 0322 08/09/20 0339 08/10/20 0351  WBC 5.6   < > 5.6 7.4 6.4 6.9  --  6.4  NEUTROABS 2.8  --   --   --   --   --   --   --   HGB 12.4*   < > 13.2  12.7* 14.0 12.8* 12.1* 12.9*  HCT 36.6*   < > 39.3 37.8* 41.7 38.4* 35.6* 38.7*  MCV 92.2   < > 93.1 92.6 93.3 93.9  --  93.7  PLT 215   < > 249 209 254 214  --  209   < > = values in this interval not displayed.   Cardiac Enzymes: No results for input(s): CKTOTAL, CKMB, CKMBINDEX, TROPONINI in the last 168 hours. BNP: Invalid input(s): POCBNP CBG: Recent Labs  Lab 08/09/20 1154 08/09/20 1716 08/09/20 2043 08/10/20 0819 08/10/20 1149  GLUCAP 157* 149* 102* 95 127*   D-Dimer No results for input(s): DDIMER in the last 72 hours. Hgb A1c No results for input(s): HGBA1C in the last 72 hours. Lipid Profile No results for input(s): CHOL, HDL, LDLCALC, TRIG, CHOLHDL, LDLDIRECT in the last 72 hours. Thyroid function studies No results for input(s): TSH, T4TOTAL, T3FREE, THYROIDAB in the last 72 hours.  Invalid input(s): FREET3 Anemia work up No results for input(s): VITAMINB12, FOLATE, FERRITIN, TIBC, IRON, RETICCTPCT in the last 67  hours. Urinalysis    Component Value Date/Time   COLORURINE YELLOW 07/30/2014 1951   APPEARANCEUR CLEAR 07/30/2014 1951   LABSPEC 1.012 07/30/2014 1951   PHURINE 5.0 07/30/2014 1951   GLUCOSEU NEGATIVE 07/30/2014 1951   HGBUR NEGATIVE 07/30/2014 1951   BILIRUBINUR NEGATIVE 07/30/2014 [place] NEGATIVE 07/30/2014 1951   PROTEINUR 30 (A) 07/30/2014 1951   UROBILINOGEN 0.2 07/30/2014 1951   NITRITE NEGATIVE 07/30/2014 1951   LEUKOCYTESUR NEGATIVE 07/30/2014 1951   Sepsis Labs Invalid input(s): PROCALCITONIN,  WBC,  LACTICIDVEN Microbiology Recent Results (from the past 240 hour(s))  SARS CORONAVIRUS 2 (TAT 6-24 HRS) Nasopharyngeal Nasopharyngeal Swab     Status: None   Collection Time: 08/06/20  1:37 AM   Specimen: Nasopharyngeal Swab  Result Value Ref Range Status   SARS Coronavirus 2 NEGATIVE NEGATIVE Final    Comment: (NOTE) SARS-CoV-2 target nucleic acids are NOT DETECTED.  The SARS-CoV-2 RNA is generally detectable in upper  and lower respiratory specimens during the acute phase of infection. Negative results do not preclude SARS-CoV-2 infection, do not rule out co-infections with other pathogens, and should not be used as the sole basis for treatment or other patient management decisions. Negative results must be combined with clinical observations, patient history, and epidemiological information. The expected result is Negative.  Fact Sheet for Patients: SugarRoll.be  Fact Sheet for Healthcare Providers: https://www.woods-mathews.com/  This test is not yet approved or cleared by the [place] FDA and  has been authorized for detection and/or diagnosis of SARS-CoV-2 by FDA under an Emergency Use Authorization (EUA). This EUA will remain  in effect (meaning this test can be used) for the duration of the COVID-19 declaration under Se ction 564(b)(1) of the Act, 21 U.S.C. section 360bbb-3(b)(1), unless the authorization is terminated or revoked sooner.  Performed at [facility], [place] [address], [place], [place] [address]   OVA + PARASITE EXAM     Status: None   Collection Time: 08/06/20  4:31 PM   Specimen: STOOL  Result Value Ref Range Status   OVA + PARASITE EXAM Final report  Final    Comment: (NOTE) These results were obtained using wet preparation(s) and trichrome stained smear. This test does not include testing for Cryptosporidium parvum, Cyclospora, or Microsporidia. Performed At: [facility] [place], [place] [id number] Truddie Coco MD OI:[id number]    Source of Sample STOOL  Final    Comment: Performed at [facility] [address] [place], [place] [address]  Gastrointestinal Panel by PCR , Stool     Status: Abnormal   Collection Time: 08/06/20  4:31 PM   Specimen: STOOL  Result Value Ref Range Status   Campylobacter species NOT DETECTED NOT DETECTED Final   Plesimonas shigelloides  NOT DETECTED NOT DETECTED Final   Salmonella species NOT DETECTED NOT DETECTED Final   Yersinia enterocolitica NOT DETECTED NOT DETECTED Final   Vibrio species NOT DETECTED NOT DETECTED Final   Vibrio cholerae NOT DETECTED NOT DETECTED Final   Enteroaggregative E coli (EAEC) NOT DETECTED NOT DETECTED Final   Enteropathogenic E coli (EPEC) NOT DETECTED NOT DETECTED Final   Enterotoxigenic E coli (ETEC) NOT DETECTED NOT DETECTED Final   Shiga like toxin producing E coli (STEC) NOT DETECTED NOT DETECTED Final   Shigella/Enteroinvasive E coli (EIEC) NOT DETECTED NOT DETECTED Final   Cryptosporidium NOT DETECTED NOT DETECTED Final   Cyclospora cayetanensis NOT DETECTED NOT DETECTED Final   Entamoeba histolytica NOT DETECTED  NOT DETECTED Final   Giardia lamblia NOT DETECTED NOT DETECTED Final   Adenovirus F40/41 NOT DETECTED NOT DETECTED Final   Astrovirus NOT DETECTED NOT DETECTED Final   Norovirus GI/GII NOT DETECTED NOT DETECTED Final   Rotavirus A DETECTED (A) NOT DETECTED Final   Sapovirus (I, II, IV, and V) NOT DETECTED NOT DETECTED Final    Comment: Performed at [facility], [address] [place], [place] [address]  Calprotectin, Fecal     Status: None   Collection Time: 08/06/20  4:31 PM   Specimen: STOOL  Result Value Ref Range Status   Calprotectin, Fecal 31 0 - 120 ug/g Final    Comment: (NOTE) Concentration     Interpretation   Follow-Up <16 - 50 ug/g     Normal           None >50 -120 ug/g     Borderline       Re-evaluate in 4-6 weeks    >120 ug/g     Abnormal         Repeat as clinically                                   indicated Performed At: [facility] [place], [place] [id number] Rush Farmer MD MB:[id number]    Time coordinating discharge: 35 minutes  SIGNED:  Kerney Elbe, DO [facility] 08/10/2020, 6:52 PM Pager is on AMION  If 7PM-7AM, please contact night-coverage [email or web address]

## 2020-08-10 NOTE — Op Note (Signed)
[facility] Patient Name: [name] Procedure Date: 08/10/2020 MRN: [id number] Attending MD: Carlota Raspberry. Havery Moros , MD Date of Birth: January 04, 1946 CSN: [id number] Age: 75 Admit Type: Inpatient Procedure:                Colonoscopy Indications:              Chronic diarrhea - 3 months of severe diarrhea,                            fecal incontinence, episodes of recent rectal                            bleeding, on Plavix. Tested positive for Rotavirus                            however that would not be expected to cause chronic                            symptoms over months Providers:                Carlota Raspberry. Havery Moros, MD, Cleda Daub, RN,                            Tyrone Apple, Technician, Arnoldo Hooker, CRNA Referring MD:              Medicines:                Monitored Anesthesia Care Complications:            No immediate complications. Estimated blood loss:                            Minimal. Estimated Blood Loss:     Estimated blood loss was minimal. Procedure:                Pre-Anesthesia Assessment:                           - Prior to the procedure, a History and Physical                            was performed, and patient medications and                            allergies were reviewed. The patient's tolerance of                            previous anesthesia was also reviewed. The risks                            and benefits of the procedure and the sedation                            options and risks were discussed with the patient.  All questions were answered, and informed consent                            was obtained. Prior Anticoagulants: The patient has                            taken Plavix (clopidogrel), last dose was 5 days                            prior to procedure. ASA Grade Assessment: III - A                            patient with severe systemic disease. After                             reviewing the risks and benefits, the patient was                            deemed in satisfactory condition to undergo the                            procedure.                           After obtaining informed consent, the colonoscope                            was passed under direct vision. Throughout the                            procedure, the patient's blood pressure, pulse, and                            oxygen saturations were monitored continuously. The                            CF-HQ190L ([id number] Olympus colonoscope was                            introduced through the anus and advanced to the the                            terminal ileum, with identification of the                            appendiceal orifice and IC valve. The colonoscopy                            was performed without difficulty. The patient                            tolerated the procedure well. The quality of the  bowel preparation was adequate. The ileocecal                            valve, the appendiceal orifice and the rectum were                            photographed. Scope In: 9:01:34 AM Scope Out: 9:35:36 AM Scope Withdrawal Time: 0 hours 28 minutes 2 seconds  Total Procedure Duration: 0 hours 34 minutes 2 seconds  Findings:      Hemorrhoids were found on perianal exam.      Limited views of the terminal ileum appeared normal.      A cluster of small angiodysplastic lesions without bleeding were found       in the cecum. Fulguration to ablate the lesion to prevent bleeding by       argon plasma was successful.      A single large angiodysplastic lesion without bleeding was found in the       ascending colon. Fulguration to ablate the lesion to prevent bleeding by       argon plasma was successful. To prevent bleeding post-intervention, two       hemostatic clips were successfully placed across the lesion (given       recent rectal bleeding, need to resume  Plavix).      Multiple small-mouthed diverticula were found in the sigmoid colon.      Internal hemorrhoids were found during retroflexion. The hemorrhoids       were moderate.      The exam was otherwise without abnormality. No overt inflammatory       changes. Prep only fair in the right colon initially, several minutes       spent lavaging the colon in this area to achieve adequate views.      Biopsies for histology were taken with a cold forceps from the right       colon, left colon and transverse colon for evaluation of microscopic       colitis. Impression:               - Hemorrhoids found on perianal exam.                           - The examined portion of the ileum was normal.                           - A few non-bleeding colonic angiodysplastic                            lesions in the cecum. Treated with argon plasma                            coagulation (APC).                           - A single non-bleeding colonic angiodysplastic                            lesion in the ascending colon. Treated with argon  plasma coagulation (APC). Clips were placed                            prophylactically.                           - Diverticulosis in the sigmoid colon.                           - Internal hemorrhoids.                           - The examination was otherwise normal.                           - Biopsies were taken with a cold forceps from the                            right colon, left colon and transverse colon for                            evaluation of microscopic colitis.                           Suspect bleeding may likely have been due to                            hemorrhoids in the setting of diarrhea, no stigmata                            of bleeding at the AVMs but they were treated in                            case related. Moderate Sedation:      No moderate sedation, case performed with MAC Recommendation:           -  Return patient to hospital ward for ongoing care.                           - Resume regular diet.                           - Continue present medications.                           - Resume Plavix tomorrow                           - Await pathology results.                           - Okay to discharge home from GI perspective -                            diarrhea improved from previous, use immodium PRN  until biopsies come back Procedure Code(s):        --- Professional ---                           [phone], 59, Colonoscopy, flexible; with control of                            bleeding, any method                           45380, Colonoscopy, flexible; with biopsy, single                            or multiple Diagnosis Code(s):        --- Professional ---                           K64.8, Other hemorrhoids                           K55.20, Angiodysplasia of colon without hemorrhage                           K52.9, Noninfective gastroenteritis and colitis,                            unspecified                           K62.5, Hemorrhage of anus and rectum                           K57.30, Diverticulosis of large intestine without                            perforation or abscess without bleeding CPT copyright 2019 American Medical Association. All rights reserved. The codes documented in this report are preliminary and upon coder review may  be revised to meet current compliance requirements. Remo Lipps P. Havery Moros, MD 08/10/2020 9:46:27 AM This report has been signed electronically. Number of Addenda: 0

## 2020-08-10 NOTE — Anesthesia Postprocedure Evaluation (Signed)
Anesthesia Post Note  Patient: JAX KENTNER  Procedure(s) Performed: ESOPHAGOGASTRODUODENOSCOPY (EGD) WITH PROPOFOL (N/A ) COLONOSCOPY WITH PROPOFOL (N/A ) BIOPSY HOT HEMOSTASIS (ARGON PLASMA COAGULATION/BICAP) (N/A ) HEMOSTASIS CLIP PLACEMENT     Patient location during evaluation: PACU Anesthesia Type: MAC Level of consciousness: awake and alert and oriented Pain management: pain level controlled Vital Signs Assessment: post-procedure vital signs reviewed and stable Respiratory status: spontaneous breathing, nonlabored ventilation and respiratory function stable Cardiovascular status: blood pressure returned to baseline Postop Assessment: no apparent nausea or vomiting Anesthetic complications: no   No complications documented.  Last Vitals:  Vitals:   08/10/20 1010 08/10/20 1028  BP: (!) 155/69 (!) 167/67  Pulse: (!) 55 (!) 54  Resp: 12 16  Temp:  36.7 C  SpO2: 100% 98%    Last Pain:  Vitals:   08/10/20 1028  TempSrc: Oral  PainSc:                  Brennan Bailey

## 2020-08-11 LAB — PANCREATIC ELASTASE, FECAL: Pancreatic Elastase-1, Stool: 380 ug Elast./g (ref >200)

## 2020-08-11 LAB — SURGICAL PATHOLOGY

## 2020-09-21 ENCOUNTER — Encounter: Payer: Self-pay | Admitting: Gastroenterology

## 2020-09-21 ENCOUNTER — Ambulatory Visit: Payer: Medicare Other | Admitting: Gastroenterology

## 2020-09-21 ENCOUNTER — Other Ambulatory Visit (INDEPENDENT_AMBULATORY_CARE_PROVIDER_SITE_OTHER): Payer: Medicare Other

## 2020-09-21 VITALS — BP 118/60 | HR 56 | Ht 69.0 in | Wt 198.2 lb

## 2020-09-21 DIAGNOSIS — K625 Hemorrhage of anus and rectum: Secondary | ICD-10-CM

## 2020-09-21 LAB — CBC
HCT: 35.1 % — ABNORMAL LOW (ref 39.0–52.0)
Hemoglobin: 12.3 g/dL — ABNORMAL LOW (ref 13.0–17.0)
MCHC: 35 g/dL (ref 30.0–36.0)
MCV: 88.2 fl (ref 78.0–100.0)
Platelets: 225 10*3/uL (ref 150.0–400.0)
RBC: 3.98 Mil/uL — ABNORMAL LOW (ref 4.22–5.81)
RDW: 13.2 % (ref 11.5–15.5)
WBC: 7.3 10*3/uL (ref 4.0–10.5)

## 2020-09-21 MED ORDER — PSYLLIUM 95 % PO PACK: 1.0000 | PACK | Freq: Every day | ORAL | Status: DC

## 2020-09-21 NOTE — Progress Notes (Signed)
Review of pertinent gastrointestinal problems: 1.  Referral from [place] in [place], establish care with Dr. Ardis Hughs, October 2020 2.  Previous patient [facility] Dr. Michail Sermon including colonoscopy 2012 9 small polyps from his rectum, 5 mm sigmoid polyp, 1.2 cm rectal polyp.  These were adenomas on pathology.  Colonoscopy November 2020 Dr. 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  There were multiple diverticulosis patches throughout his left colon.  He was not having diarrhea at this time, biopsies were not indicated.  He was actually recommended to take fiber supplement for his chronic constipation. 3.  Hematochezia and significant diarrhea (positive for rotavirus on stool testing) led to colonoscopy while hospitalized in March 2022.  Colonoscopy Dr. Havery Moros found and ablated 2 large proximal colon AVMs.  Diverticulosis in the left colon was noted again.  Hemorrhoids were noted.  Biopsies were taken from throughout the colon and these showed no sign of microscopic colitis.  Same day EGD for his chronic diarrhea found a small hiatal hernia, otherwise normal.  Duodenal biopsies were negative for celiac sprue.   HPI: This is a very pleasant 75 year old man  He was recently hospitalized about 2 months ago for a few days admission for significant diarrhea and minor hematochezia.  He underwent colonoscopy and EGD.  See those results all above.  Most recent CBC was just prior to hospital discharge August 10, 2020 and his hemoglobin was 12.5.  Although he did have some bleeding leading into that admission his hemoglobin was never very low at all.  Hemoglobin nadir was 12.0.  Since leaving the hospital he has no longer been bothered by diarrhea.  He does have a sensation of incomplete evacuation.  He used to take fiber supplements but was told to stop with all his diarrhea and he has not resumed them.  He has very minor blood on the  tissue paper when he wipes.  He was having some periumbilical abdominal pains and brought with him an ultrasound report from the [place] system that show that he has cholelithiasis.   ROS: complete GI ROS as described in HPI, all other review negative.  Constitutional:  No unintentional weight loss   Past Medical History:  Diagnosis Date  . Anginal pain ([place]) 07-31-13   coronary stent x1 last 12'14  . Arthritis    osteoarthritis  . Bursitis 07-31-13   heel  . Cancer ([place]) 07-31-13   skin cancer left cheek  . Cataract 2014   bilateral cataract extraction  . 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 Coronary artery disease 07-31-13   100 % blockage in left leg artery,affects left arm b/p readings  . Diabetes mellitus without complication ([place])   . Dysrhythmia 07-31-13    hx. PAC's   . GERD (gastroesophageal reflux disease)   . Heart murmur   . History of kidney stones 07-31-13   x 1  . Hyperlipidemia   . Hypertension   . Peripheral vascular disease ([place])   . Plantar fasciitis of left foot 07-31-13   left leg  . PONV (postoperative nausea and vomiting)   . Rheumatic fever    when 75 years old    Past Surgical History:  Procedure Laterality Date  . BIOPSY  08/10/2020   Procedure: BIOPSY;  Surgeon: Yetta Flock, MD;  Location: [facility];  Service: Gastroenterology;;  . CARDIAC CATHETERIZATION     x1  stents 12'14.  . CIRCUMCISION    . COLONOSCOPY WITH PROPOFOL N/A 08/10/2020   Procedure: COLONOSCOPY WITH PROPOFOL;  Surgeon: Yetta Flock, MD;  Location: [facility];  Service: Gastroenterology;  Laterality: N/A;  . ESOPHAGOGASTRODUODENOSCOPY (EGD) WITH PROPOFOL N/A 08/10/2020   Procedure: ESOPHAGOGASTRODUODENOSCOPY (EGD) WITH PROPOFOL;  Surgeon: Yetta Flock, MD;  Location: [facility];  Service: Gastroenterology;  Laterality: N/A;  . FEMORAL-PERONEAL BYPASS GRAFT Right   . HEMOSTASIS CLIP PLACEMENT  08/10/2020    Procedure: HEMOSTASIS CLIP PLACEMENT;  Surgeon: Yetta Flock, MD;  Location: [facility];  Service: Gastroenterology;;  . HOT HEMOSTASIS N/A 08/10/2020   Procedure: HOT HEMOSTASIS (ARGON PLASMA COAGULATION/BICAP);  Surgeon: Yetta Flock, MD;  Location: [facility];  Service: Gastroenterology;  Laterality: N/A;  . leg stent Right 07-31-13   right leg  . LUMBAR LAMINECTOMY/DECOMPRESSION MICRODISCECTOMY N/A 08/05/2013   Procedure: MICRO LUMBAR DECOMPRESSION L4 - L5;  Surgeon: Johnn Hai, MD;  Location: [facility];  Service: Orthopedics;  Laterality: N/A;  . LUMBAR LAMINECTOMY/DECOMPRESSION MICRODISCECTOMY N/A 07/22/2015   Procedure: REDO DECOMPRESSION LEVEL 2 L3-4 L4-5;  Surgeon: Susa Day, MD;  Location: [facility];  Service: Orthopedics;  Laterality: N/A;  . RENAL ARTERY STENT Right 07-31-13  . SHOULDER ARTHROSCOPY Left    RCTR  . stent in Right kidney    . VASECTOMY      Current Outpatient Medications  Medication Sig Dispense Refill  . acetaminophen (TYLENOL) 500 MG tablet Take 2 tablets (1,000 mg total) by mouth every 6 (six) hours as needed. (Patient taking differently: Take 1,000 mg by mouth every 6 (six) hours as needed for mild pain.) 30 tablet 0  . amLODipine (NORVASC) 10 MG tablet Take 1 tablet (10 mg total) by mouth daily. 30 tablet 0  . carvedilol (COREG) 3.125 MG tablet Take 3.125 mg by mouth 2 (two) times daily with a meal.    . cetirizine (ZYRTEC) 10 MG tablet Take 10 mg by mouth daily.    . cholecalciferol (VITAMIN D3) 25 MCG (1000 UNIT) tablet Take 2,000 Units by mouth every 14 (fourteen) days.    . clopidogrel (PLAVIX) 75 MG tablet Take 1 tablet (75 mg total) by mouth daily.    Marland Kitchen donepezil (ARICEPT) 5 MG tablet Take 10 mg by mouth at bedtime.    . DULoxetine (CYMBALTA) 20 MG capsule Take 20 mg by mouth daily.    . famotidine (PEPCID) 20 MG tablet Take 20 mg by mouth at bedtime.    Marland Kitchen glyBURIDE (DIABETA) 2.5 MG tablet Take 2.5 mg by mouth daily with breakfast.     . LACTOBACILLUS PROBIOTIC PO Take 1 tablet by mouth daily.    Marland Kitchen losartan (COZAAR) 25 MG tablet Take 1 tablet (25 mg total) by mouth daily. 30 tablet 0  . metFORMIN (GLUCOPHAGE) 1000 MG tablet Take 1,000 mg by mouth daily.    . nitroGLYCERIN (NITROSTAT) 0.4 MG SL tablet Place 0.4 mg under the tongue every 5 (five) minutes as needed for chest pain.    . Omega-3 Fatty Acids (FISH OIL) 1200 MG CPDR Take 1,200 mg by mouth daily.    . ondansetron (ZOFRAN) 4 MG tablet Take 1 tablet (4 mg total) by mouth every 6 (six) hours as needed for nausea. 20 tablet 0  . pantoprazole (PROTONIX) 40 MG tablet Take 40 mg by mouth daily.   5  . polyvinyl alcohol (LIQUIFILM TEARS) 1.4 % ophthalmic solution Place 1 drop into both eyes 2 (two) times daily as needed for dry  eyes.    . pravastatin (PRAVACHOL) 40 MG tablet Take 40 mg by mouth daily.     No current facility-administered medications for this visit.    Allergies as of 09/21/2020 - Review Complete 09/21/2020  Allergen Reaction Noted  . Contrast media [iodinated diagnostic agents] Nausea And Vomiting 07/28/2013  . Quinine derivatives Other (See Comments) 07/28/2013    Family History  Problem Relation Age of Onset  . Kidney disease Mother   . Pancreatic cancer Mother   . Lung cancer Father   . Colon cancer Neg Hx   . Esophageal cancer Neg Hx   . Liver disease Neg Hx   . Stomach cancer Neg Hx     Social History   Socioeconomic History  . Marital status: Married    Spouse name: Not on file  . Number of children: Not on file  . Years of education: Not on file  . Highest education level: Not on file  Occupational History  . Not on file  Tobacco Use  . Smoking status: Former Smoker    Types: Cigarettes    Quit date: 08/01/2011    Years since quitting: 9.1  . Smokeless tobacco: Never Used  Vaping Use  . Vaping Use: Never used  Substance and Sexual Activity  . Alcohol use: No  . Drug use: No  . Sexual activity: Yes  Other Topics Concern   . Not on file  Social History Narrative  . Not on file   Social Determinants of Health   Financial Resource Strain: Not on file  Food Insecurity: Not on file  Transportation Needs: Not on file  Physical Activity: Not on file  Stress: Not on file  Social Connections: Not on file  Intimate Partner Violence: Not on file     Physical Exam: BP 118/60   Pulse (!) 56   Ht 5\' 9"  (1.753 m)   Wt 198 lb 3.2 oz (89.9 kg)   SpO2 97%   BMI 29.27 kg/m  Constitutional: generally well-appearing Psychiatric: alert and oriented x3 Abdomen: soft, nontender, nondistended, no obvious ascites, no peritoneal signs, normal bowel sounds No peripheral edema noted in lower extremities  Assessment and plan: 75 y.o. male with bowel changes, minor rectal bleeding, gallstones  First his bowel changes seem to much improved.  He has waxed and waned between constipation and diarrhea.  The diarrhea has significantly abated and now he is bothered by sensation of incomplete evacuation.  He would like to resume his fiber supplement I certainly think that that might help his incomplete evacuation symptoms.  He will therefore restart Metamucil which she was taking up until his admission for diarrhea at which time he was told to stop.  Think he needs a repeat CBC, he has minor rectal bleeding on toilet paper which is very consistent with hemorrhoids and I just want to make sure he has not become anemic with this checked out to be the case.  He had some periumbilical pains and this led to an ultrasound the [place] system which showed cholelithiasis.  I am not convinced that all of the cholelithiasis is causing any of his symptoms.  We discussed classic biliary colic type situation and he knows that if he has any of those type pain episodes to either call here or call his care provider at the [facility] to be considered for general surgery referral.  Please see the "Patient Instructions" section for addition details about the  plan.  Owens Loffler, MD [facility] 09/21/2020, 2:50  PM   Total time on date of encounter was 30 minutes (this included time spent preparing to see the patient reviewing records; obtaining and/or reviewing separately obtained history; performing a medically appropriate exam and/or evaluation; counseling and educating the patient and family if present; ordering medications, tests or procedures if applicable; and documenting clinical information in the health record).

## 2020-09-21 NOTE — Patient Instructions (Signed)
If you are age 77 or older, your body mass index should be between 23-30. Your Body mass index is 29.27 kg/m. If this is out of the aforementioned range listed, please consider follow up with your Primary Care Provider.  Your provider has requested that you go to the basement level for lab work before leaving today. Press "B" on the elevator. The lab is located at the first door on the left as you exit the elevator.  Due to recent changes in healthcare laws, you may see the results of your imaging and laboratory studies on MyChart before your provider has had a chance to review them.  We understand that in some cases there may be results that are confusing or concerning to you. Not all laboratory results come back in the same time frame and the provider may be waiting for multiple results in order to interpret others.  Please give [place] 48 hours in order for your provider to thoroughly review all the results before contacting the office for clarification of your results.   Please purchase the following medications over the counter and take as directed:  START: Metamucil powder use everyday.  Thank you for entrusting me with your care and choosing [facility].  Dr Ardis Hughs

## 2020-12-02 DIAGNOSIS — Z9889 Other specified postprocedural states: Secondary | ICD-10-CM

## 2021-03-23 IMAGING — CT CT ABD-PELV W/O CM
2 of 4 series · 15 of 46 positions shown, 17 images · non-contrast
Comparison: CT Abdomen and Pelvis 07/04/2010.

CLINICAL DATA: 75-year-old male with left lower quadrant abdominal
pain. Blood in stool yesterday.

EXAM:
CT ABDOMEN AND PELVIS WITHOUT CONTRAST
TECHNIQUE: Multidetector CT imaging of the abdomen and pelvis was performed
following the standard protocol without IV contrast.

[Series 2: axial st · axial · 0.83mm/px · z∈[+1032,+1517]mm · 12 of 111 slices shown, 14 images]
[im 7/111  soft-tissue]
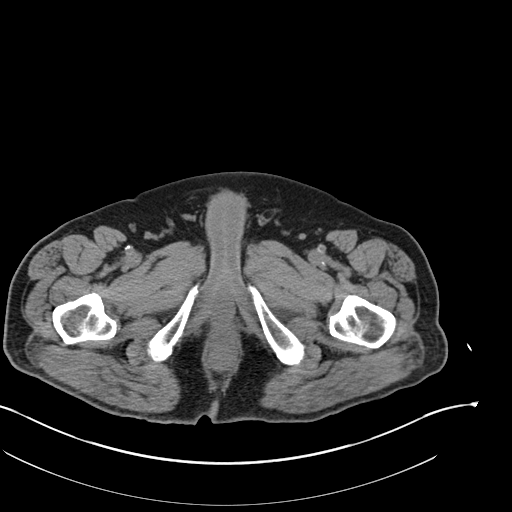
[im 7/111  bone]
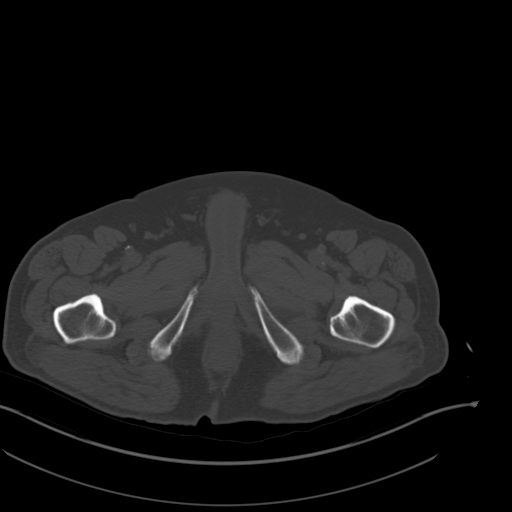
[im 19/111  soft-tissue]
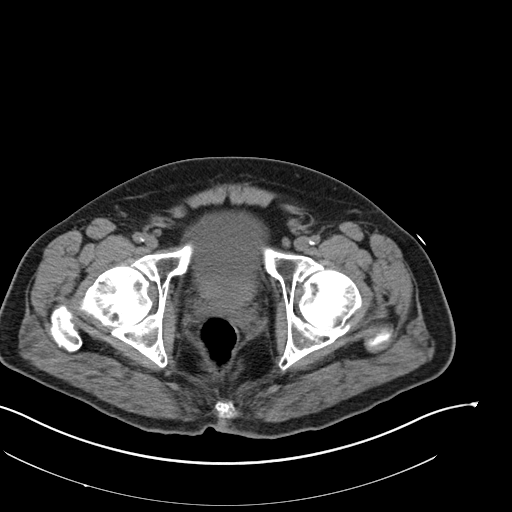
[im 25/111  soft-tissue]
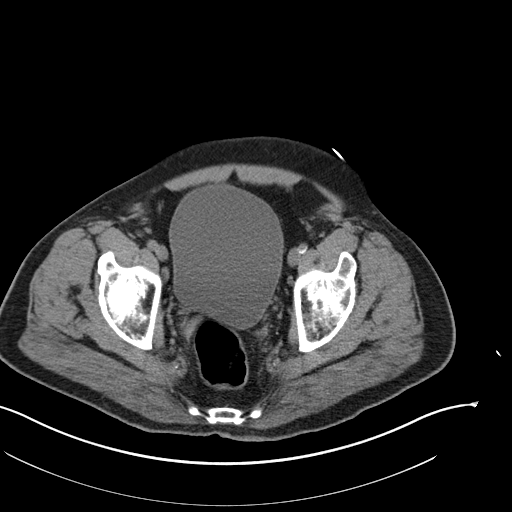
[im 31/111  soft-tissue]
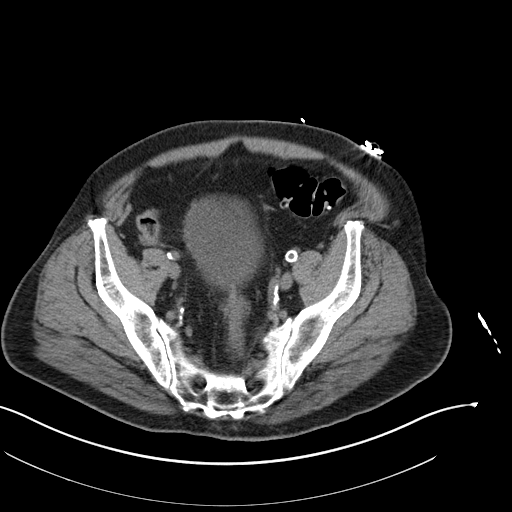
[im 43/111  soft-tissue]
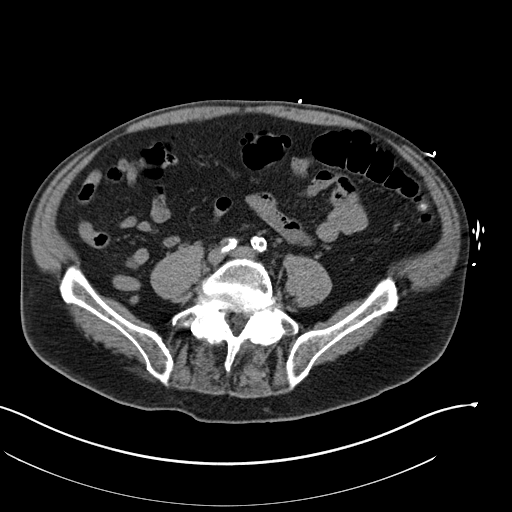
[im 49/111  soft-tissue]
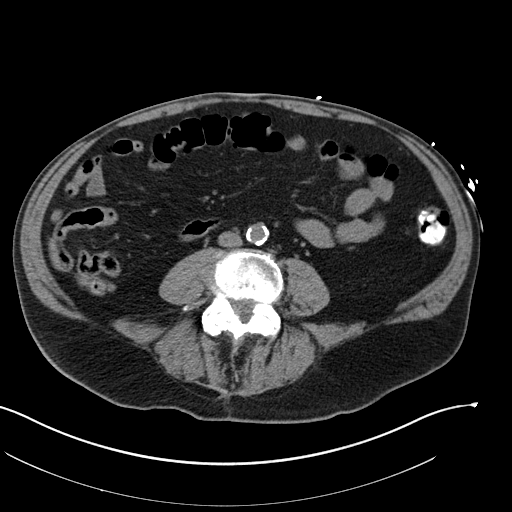
[im 62/111  soft-tissue]
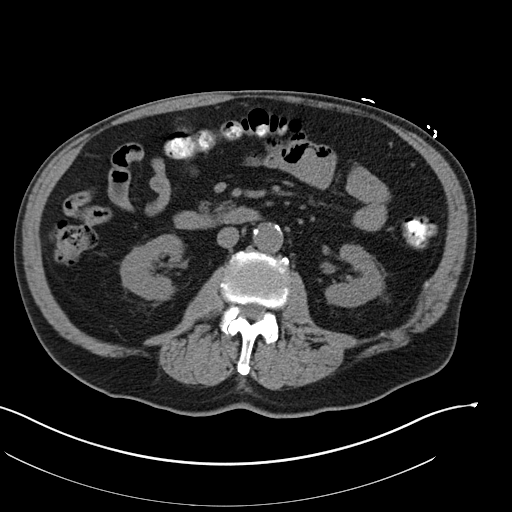
[im 68/111  soft-tissue]
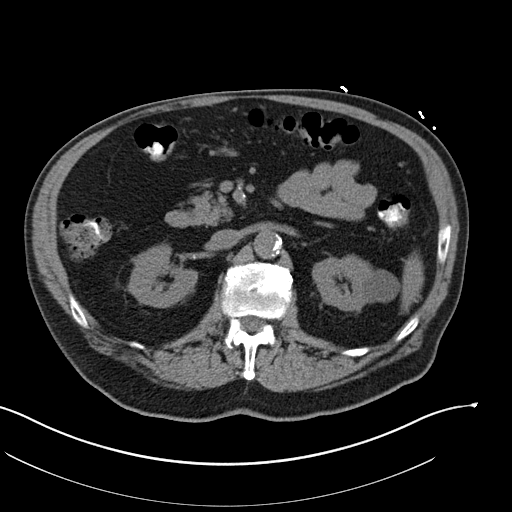
[im 80/111  soft-tissue]
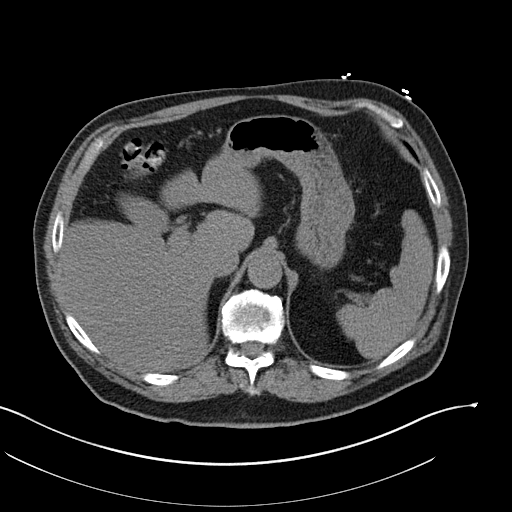
[im 80/111  bone]
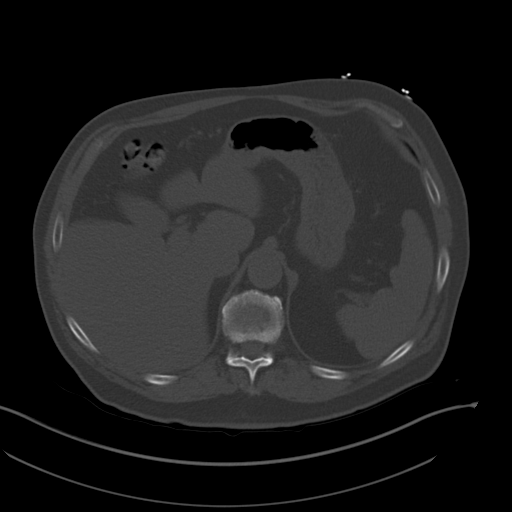
[im 86/111  soft-tissue]
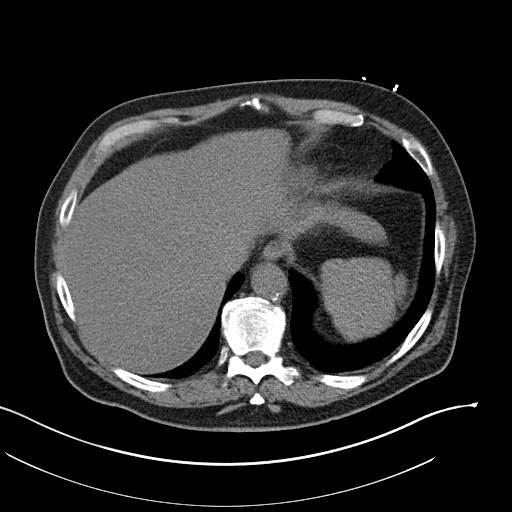
[im 92/111  soft-tissue]
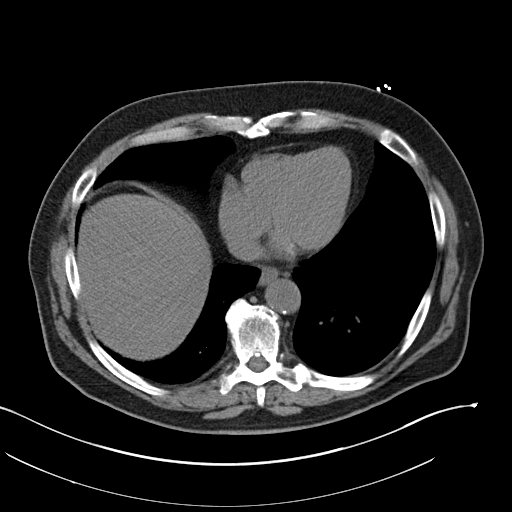
[im 104/111  soft-tissue]
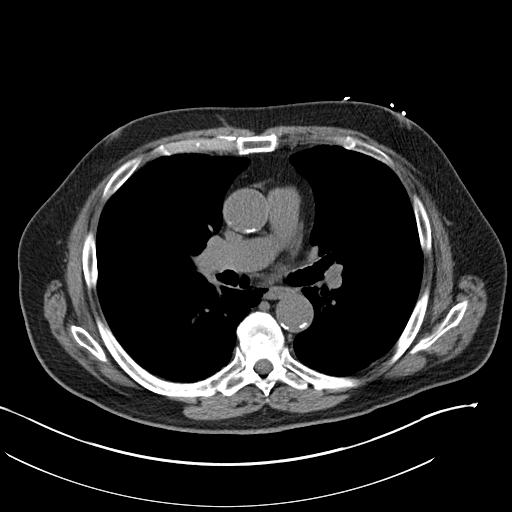

[Series 4: coronal st · coronal · 0.80mm/px · 3 of 142 slices shown]
[im 48/142  soft-tissue]
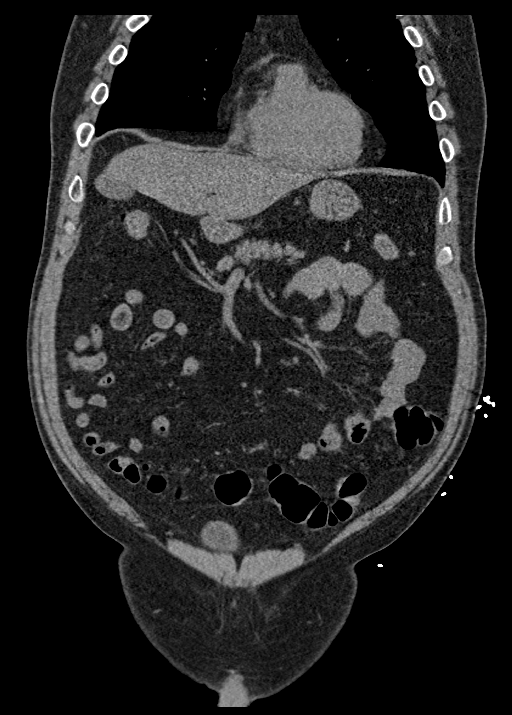
[im 63/142  soft-tissue]
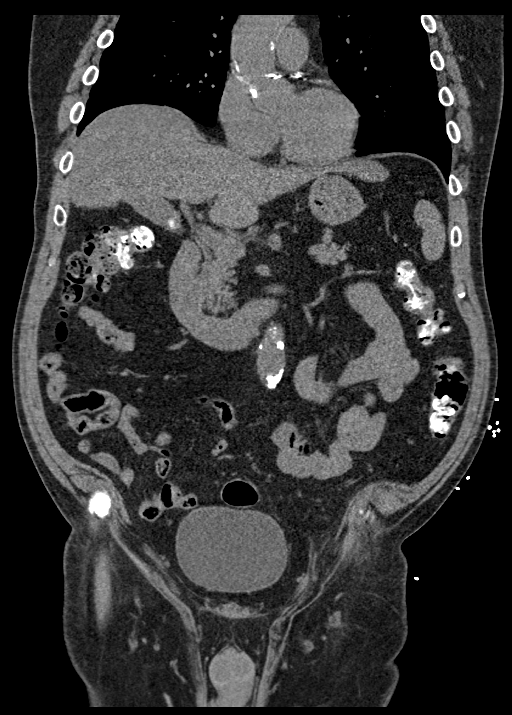
[im 79/142  soft-tissue]
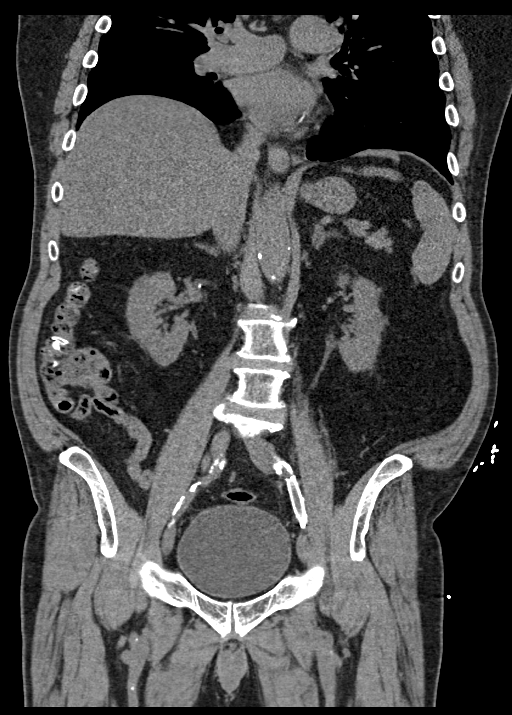

[15 of 46 positions shown; findings below may reference images not displayed]

FINDINGS: Lower chest: Calcified coronary artery atherosclerosis. Calcified
aortic atherosclerosis. No cardiomegaly or pericardial effusion.
Chronic lung base scarring appears progressed since 2072 and greater
on the right. No pleural effusion.

Hepatobiliary: Chronic hepatic steatosis. Enlarged chronic
gallstone, now 15 mm diameter. But there is no pericholecystic
inflammation.

Pancreas: Negative.

Spleen: Negative.

Adrenals/Urinary Tract: Normal adrenal glands. Chronic left renal
exophytic cyst appears stable and benign. No hydronephrosis. No
nephrolithiasis. Some renal vascular calcifications are noted. Both
ureters are decompressed and negative. The bladder is mildly
distended but otherwise unremarkable.

Stomach/Bowel: Redundant sigmoid colon containing gas with
diverticula. No active inflammation. Mild retained stool mixed with
hyperdense material in the descending and upstream large bowel
loops. Occasional diverticula there without inflammation. Normal
appendix arising from the cecum on series 2, image 63. Negative
terminal ileum. No dilated small bowel. Decompressed stomach and
duodenum. No free air or free fluid.

Vascular/Lymphatic: Extensive Aortoiliac calcified atherosclerosis.
Mildly tortuous abdominal aorta. Vascular patency is not evaluated
in the absence of IV contrast.

Reproductive: Negative.

Other: No pelvic free fluid.

Musculoskeletal: Chronic thoracic endplate degeneration with some
levels of flowing endplate osteophytes and ankylosis. Advanced lower
lumbar facet degeneration, and disc degeneration at the lumbosacral
junction. No acute osseous abnormality identified.
IMPRESSION: 1. No acute or inflammatory process identified in the non-contrast
abdomen or pelvis:
Progressed cholelithiasis since 2072 but no CT evidence of acute
cholecystitis.
Diverticulosis of the large bowel without active inflammation.
Chronic hepatic steatosis. Cholelithiasis.

2. Progressed lung base scarring since [DATE]. Aortic Atherosclerosis ([id number]).

## 2022-07-20 ENCOUNTER — Encounter (HOSPITAL_BASED_OUTPATIENT_CLINIC_OR_DEPARTMENT_OTHER): Payer: Self-pay

## 2022-07-20 DIAGNOSIS — G4730 Sleep apnea, unspecified: Secondary | ICD-10-CM

## 2022-07-26 ENCOUNTER — Other Ambulatory Visit (HOSPITAL_COMMUNITY): Payer: Self-pay | Admitting: Neurology

## 2022-07-26 DIAGNOSIS — G20A1 Parkinson's disease without dyskinesia, without mention of fluctuations: Secondary | ICD-10-CM

## 2022-08-05 ENCOUNTER — Ambulatory Visit (HOSPITAL_BASED_OUTPATIENT_CLINIC_OR_DEPARTMENT_OTHER): Payer: Medicare Other | Attending: Family | Admitting: Internal Medicine

## 2022-08-05 VITALS — Ht 69.0 in | Wt 190.0 lb

## 2022-08-05 DIAGNOSIS — G4730 Sleep apnea, unspecified: Secondary | ICD-10-CM | POA: Diagnosis present

## 2022-08-05 DIAGNOSIS — G4700 Insomnia, unspecified: Secondary | ICD-10-CM

## 2022-08-05 DIAGNOSIS — G4733 Obstructive sleep apnea (adult) (pediatric): Secondary | ICD-10-CM | POA: Insufficient documentation

## 2022-08-12 DIAGNOSIS — G4730 Sleep apnea, unspecified: Secondary | ICD-10-CM | POA: Diagnosis not present

## 2022-08-12 DIAGNOSIS — G4700 Insomnia, unspecified: Secondary | ICD-10-CM

## 2022-08-12 NOTE — Procedures (Signed)
Patient Name: [name], Graffeo Date: 08/05/2022 Gender: Male D.O.B: 09-16-1945 Age (years): 77 Referring Provider: Alice Reichert Height (inches): 80 Interpreting Physician: Baird Lyons MD, ABSM Weight (lbs): 190 RPSGT: Baxter Flattery BMI: 28 MRN: [id number] Neck Size: 17.50  CLINICAL INFORMATION Sleep Study Type: Split Night CPAP Indication for sleep study: Fatigue, OSA, Snoring, Witnessed Apneas Epworth Sleepiness Score: 4  SLEEP STUDY TECHNIQUE As per the AASM Manual for the Scoring of Sleep and Associated Events v2.3 (April 2016) with a hypopnea requiring 4% desaturations.  The channels recorded and monitored were frontal, central and occipital EEG, electrooculogram (EOG), submentalis EMG (chin), nasal and oral airflow, thoracic and abdominal wall motion, anterior tibialis EMG, snore microphone, electrocardiogram, and pulse oximetry. Continuous positive airway pressure (CPAP) was initiated when the patient met split night criteria and was titrated according to treat sleep-disordered breathing.  MEDICATIONS Medications self-administered by patient taken the night of the study : none reported  RESPIRATORY PARAMETERS Diagnostic  Total AHI (/hr): 90.7 RDI (/hr): 90.7 OA Index (/hr): 76.2 CA Index (/hr): 0.4 REM AHI (/hr): 92.5 NREM AHI (/hr): 90.3 Supine AHI (/hr): 112.7 Non-supine AHI (/hr): 53.9 Min O2 Sat (%): 85.0 Mean O2 (%): 91.8 Time below 88% (min): 11.6   Titration  Optimal Pressure (cm): 14 AHI at Optimal Pressure (/hr): 0 Min O2 at Optimal Pressure (%): 93.0 Supine % at Optimal (%): 100 Sleep % at Optimal (%): 100   SLEEP ARCHITECTURE The recording time for the entire night was 409.2 minutes.  During a baseline period of 261.9 minutes, the patient slept for 157.5 minutes in REM and nonREM, yielding a sleep efficiency of 60.1%. Sleep onset after lights out was 67.1 minutes with a REM latency of 24.0 minutes. The patient spent 2.5% of the night in stage  N1 sleep, 81.9% in stage N2 sleep, 0.3% in stage N3 and 15.2% in REM.  During the titration period of 137.0 minutes, the patient slept for 105.6 minutes in REM and nonREM, yielding a sleep efficiency of 77.1%. Sleep onset after CPAP initiation was 26.9 minutes with a REM latency of 69.5 minutes. The patient spent 9.9% of the night in stage N1 sleep, 85.3% in stage N2 sleep, 0.0% in stage N3 and 4.7% in REM.  CARDIAC DATA The 2 lead EKG demonstrated sinus rhythm. The mean heart rate was 100.0 beats per minute. Other EKG findings include: None.  LEG MOVEMENT DATA The total Periodic Limb Movements of Sleep (PLMS) were 0. The PLMS index was 0.0 .  IMPRESSIONS - Severe obstructive sleep apnea occurred during the diagnostic portion of the study (AHI = 90.7/hour). An optimal PAP pressure was selected for this patient ( 14 cm of water) - No significant central sleep apnea occurred during the diagnostic portion of the study (CAI = 0.4/hour). - Moderate oxygen desaturation was noted during the diagnostic portion of the study (Min O2 =85.0%). Minimum O2 saturation on CPAP 14 was 93%. - Loud snoring was audible during the diagnostic portion of the study. - No cardiac abnormalities were noted during this study. - Clinically significant periodic limb movements did not occur during sleep.  DIAGNOSIS - Obstructive Sleep Apnea (G47.33)  RECOMMENDATIONS - Trial of CPAP therapy on 14 cm H2O or autopap 10-20. - Patient used a Large size Fisher&Paykel Full Face Simplus mask and heated humidification. - Be careful with alcohol, sedatives and other CNS depressants that may worsen sleep apnea and disrupt normal sleep architecture. - Sleep hygiene should be reviewed to  assess factors that may improve sleep quality. - Weight management and regular exercise should be initiated or continued.  [Electronically signed] 08/12/2022 09:54 AM  Baird Lyons MD, ABSM Diplomate, American Board of Sleep Medicine NPI:  [id number]                      [place] [place] [facility]  ELECTRONICALLY SIGNED ON:  08/12/2022, 9:50 AM [place] PH: [phone]   FX: [phone] [place]

## 2022-08-17 ENCOUNTER — Encounter (HOSPITAL_COMMUNITY)
Admission: RE | Admit: 2022-08-17 | Discharge: 2022-08-17 | Disposition: A | Discharge status: Home / Self Care | Payer: No Typology Code available for payment source | Source: Ambulatory Visit | Attending: Neurology | Admitting: Neurology

## 2022-08-17 DIAGNOSIS — G20A1 Parkinson's disease without dyskinesia, without mention of fluctuations: Secondary | ICD-10-CM | POA: Insufficient documentation

## 2022-08-17 MED ORDER — POTASSIUM IODIDE (ANTIDOTE) 130 MG PO TABS: 130.0000 mg | ORAL_TABLET | Freq: Once | ORAL | Status: DC

## 2022-08-17 MED ORDER — POTASSIUM IODIDE (ANTIDOTE) 130 MG PO TABS
ORAL_TABLET | ORAL | Status: AC
  Filled 2022-08-17: qty 1

## 2022-08-17 MED ORDER — IOFLUPANE I 123 185 MBQ/2.5ML IV SOLN
4.5000 | Freq: Once | INTRAVENOUS | Status: AC | PRN
  Administered 2022-08-17: 4.5 via INTRAVENOUS
  Filled 2022-08-17: qty 5

## 2022-10-07 ENCOUNTER — Emergency Department (HOSPITAL_COMMUNITY)
Admission: EM | Admit: 2022-10-07 | Discharge: 2022-10-07 | Disposition: A | Discharge status: Home / Self Care | Payer: No Typology Code available for payment source | Attending: Emergency Medicine | Admitting: Emergency Medicine

## 2022-10-07 ENCOUNTER — Other Ambulatory Visit: Payer: Self-pay

## 2022-10-07 ENCOUNTER — Emergency Department (HOSPITAL_COMMUNITY): Payer: No Typology Code available for payment source

## 2022-10-07 DIAGNOSIS — R2681 Unsteadiness on feet: Secondary | ICD-10-CM | POA: Insufficient documentation

## 2022-10-07 DIAGNOSIS — Z7902 Long term (current) use of antithrombotics/antiplatelets: Secondary | ICD-10-CM | POA: Diagnosis not present

## 2022-10-07 DIAGNOSIS — M6740 Ganglion, unspecified site: Secondary | ICD-10-CM | POA: Diagnosis not present

## 2022-10-07 DIAGNOSIS — Z7984 Long term (current) use of oral hypoglycemic drugs: Secondary | ICD-10-CM | POA: Diagnosis not present

## 2022-10-07 DIAGNOSIS — M48061 Spinal stenosis, lumbar region without neurogenic claudication: Secondary | ICD-10-CM

## 2022-10-07 DIAGNOSIS — Z79899 Other long term (current) drug therapy: Secondary | ICD-10-CM | POA: Diagnosis not present

## 2022-10-07 DIAGNOSIS — W19XXXA Unspecified fall, initial encounter: Secondary | ICD-10-CM | POA: Insufficient documentation

## 2022-10-07 DIAGNOSIS — R531 Weakness: Secondary | ICD-10-CM

## 2022-10-07 DIAGNOSIS — M9961 Osseous and subluxation stenosis of intervertebral foramina of cervical region: Secondary | ICD-10-CM | POA: Insufficient documentation

## 2022-10-07 DIAGNOSIS — M9963 Osseous and subluxation stenosis of intervertebral foramina of lumbar region: Secondary | ICD-10-CM | POA: Insufficient documentation

## 2022-10-07 DIAGNOSIS — D72829 Elevated white blood cell count, unspecified: Secondary | ICD-10-CM | POA: Insufficient documentation

## 2022-10-07 DIAGNOSIS — E119 Type 2 diabetes mellitus without complications: Secondary | ICD-10-CM | POA: Insufficient documentation

## 2022-10-07 DIAGNOSIS — I2510 Atherosclerotic heart disease of native coronary artery without angina pectoris: Secondary | ICD-10-CM | POA: Insufficient documentation

## 2022-10-07 DIAGNOSIS — R32 Unspecified urinary incontinence: Secondary | ICD-10-CM | POA: Diagnosis not present

## 2022-10-07 DIAGNOSIS — S50912A Unspecified superficial injury of left forearm, initial encounter: Secondary | ICD-10-CM | POA: Diagnosis present

## 2022-10-07 DIAGNOSIS — M4802 Spinal stenosis, cervical region: Secondary | ICD-10-CM

## 2022-10-07 DIAGNOSIS — S52615A Nondisplaced fracture of left ulna styloid process, initial encounter for closed fracture: Secondary | ICD-10-CM | POA: Diagnosis not present

## 2022-10-07 DIAGNOSIS — M79632 Pain in left forearm: Secondary | ICD-10-CM | POA: Diagnosis not present

## 2022-10-07 DIAGNOSIS — L03114 Cellulitis of left upper limb: Secondary | ICD-10-CM | POA: Diagnosis not present

## 2022-10-07 LAB — CBC WITH DIFFERENTIAL/PLATELET
Abs Immature Granulocytes: 0.06 10*3/uL (ref 0.00–0.07)
Basophils Absolute: 0.1 10*3/uL (ref 0.0–0.1)
Basophils Relative: 1 %
Eosinophils Absolute: 0.2 10*3/uL (ref 0.0–0.5)
Eosinophils Relative: 2 %
HCT: 32.1 % — ABNORMAL LOW (ref 39.0–52.0)
Hemoglobin: 10.5 g/dL — ABNORMAL LOW (ref 13.0–17.0)
Immature Granulocytes: 1 %
Lymphocytes Relative: 11 %
Lymphs Abs: 1.3 10*3/uL (ref 0.7–4.0)
MCH: 29.7 pg (ref 26.0–34.0)
MCHC: 32.7 g/dL (ref 30.0–36.0)
MCV: 90.7 fL (ref 80.0–100.0)
Monocytes Absolute: 1.1 10*3/uL — ABNORMAL HIGH (ref 0.1–1.0)
Monocytes Relative: 9 %
Neutro Abs: 9.3 10*3/uL — ABNORMAL HIGH (ref 1.7–7.7)
Neutrophils Relative %: 76 %
Platelets: 286 10*3/uL (ref 150–400)
RBC: 3.54 MIL/uL — ABNORMAL LOW (ref 4.22–5.81)
RDW: 12.8 % (ref 11.5–15.5)
WBC: 11.9 10*3/uL — ABNORMAL HIGH (ref 4.0–10.5)
nRBC: 0 % (ref 0.0–0.2)

## 2022-10-07 LAB — COMPREHENSIVE METABOLIC PANEL
ALT: 28 U/L (ref 0–44)
AST: 21 U/L (ref 15–41)
Albumin: 3.1 g/dL — ABNORMAL LOW (ref 3.5–5.0)
Alkaline Phosphatase: 55 U/L (ref 38–126)
Anion gap: 11 (ref 5–15)
BUN: 26 mg/dL — ABNORMAL HIGH (ref 8–23)
CO2: 21 mmol/L — ABNORMAL LOW (ref 22–32)
Calcium: 8.9 mg/dL (ref 8.9–10.3)
Chloride: 106 mmol/L (ref 98–111)
Creatinine, Ser: 1.59 mg/dL — ABNORMAL HIGH (ref 0.61–1.24)
GFR, Estimated: 44 mL/min — ABNORMAL LOW (ref >60)
Glucose, Bld: 265 mg/dL — ABNORMAL HIGH (ref 70–99)
Potassium: 3.9 mmol/L (ref 3.5–5.1)
Sodium: 138 mmol/L (ref 135–145)
Total Bilirubin: 0.4 mg/dL (ref 0.3–1.2)
Total Protein: 6 g/dL — ABNORMAL LOW (ref 6.5–8.1)

## 2022-10-07 LAB — TROPONIN I (HIGH SENSITIVITY)
Troponin I (High Sensitivity): 12 ng/L (ref <18)
Troponin I (High Sensitivity): 13 ng/L (ref <18)

## 2022-10-07 MED ORDER — CEPHALEXIN 500 MG PO CAPS: 500.0000 mg | ORAL_CAPSULE | Freq: Four times a day (QID) | ORAL | 0 refills | Status: DC

## 2022-10-07 MED ORDER — MORPHINE SULFATE (PF) 4 MG/ML IV SOLN
4.0000 mg | Freq: Once | INTRAVENOUS | Status: AC
  Administered 2022-10-07: 4 mg via INTRAVENOUS
  Filled 2022-10-07: qty 1

## 2022-10-07 MED ORDER — DIAZEPAM 5 MG/ML IJ SOLN: 2.5000 mg | Freq: Once | INTRAMUSCULAR | Status: DC | PRN

## 2022-10-07 NOTE — ED Notes (Signed)
Patient transported to MRI 

## 2022-10-07 NOTE — ED Provider Notes (Addendum)
[place] EMERGENCY DEPARTMENT AT [facility] Provider Note   CSN: [id number] Arrival date & time: 10/07/22  4098     History  Chief Complaint  Patient presents with   Arm Pain   left arm tenderness   balance issues    [name] is a 77 y.o. male, history of diabetes, CAD, spinal stenosis with history of laminectomy, who presents to the ED secondary to progressive worsening of severe pain down bilateral upper extremities, and neck pain has been going on for the last 5 weeks.  Wife at bedside says that the pain has gotten progressively worse, now the patient cannot even push himself out of the chair, or even comb his hair anymore.  She notes that the pain is down both arms, and that he winces in pain, and cannot lift himself out of the chair anymore.  States that the weakness has gotten progressively worse, and he does have a history of falls.  Has fallen about 5 weeks ago, and hit his left wrist as well.  He denies any recent head trauma, but wife says that for the last 2 years he has been losing his balance, and now has developed some urinary incontinence.  She states he frequently falls and does not lift his legs up.  He has had test for Parkinson's which have been negative, but she states that they cannot keep going like this as he has a appointment with a neurologist in July, that he cannot do any of his daily activities anymore.  Denies any numbness, tingling, difficulty with speech, weakness on one side.  Pain is bilaterally.    Home Medications Prior to Admission medications   Medication Sig Start Date End Date Taking? Authorizing Provider  cephALEXin (KEFLEX) 500 MG capsule Take 1 capsule (500 mg total) by mouth 4 (four) times daily. 10/07/22  Yes Amry Cathy L, PA  acetaminophen (TYLENOL) 500 MG tablet Take 2 tablets (1,000 mg total) by mouth every 6 (six) hours as needed. Patient taking differently: Take 1,000 mg by mouth every 6 (six) hours as needed for mild  pain. 07/30/14   Arby Barrette, MD  amLODipine (NORVASC) 10 MG tablet Take 1 tablet (10 mg total) by mouth daily. 08/10/20   Marguerita Merles Latif, DO  carvedilol (COREG) 3.125 MG tablet Take 3.125 mg by mouth 2 (two) times daily with a meal.    [provider]  cetirizine (ZYRTEC) 10 MG tablet Take 10 mg by mouth daily.    [provider]  cholecalciferol (VITAMIN D3) 25 MCG (1000 UNIT) tablet Take 2,000 Units by mouth every 14 (fourteen) days.    [provider]  clopidogrel (PLAVIX) 75 MG tablet Take 1 tablet (75 mg total) by mouth daily. 08/11/20   Marguerita Merles Latif, DO  donepezil (ARICEPT) 5 MG tablet Take 10 mg by mouth at bedtime. 05/03/20   [provider]  DULoxetine (CYMBALTA) 20 MG capsule Take 20 mg by mouth daily.    [provider]  famotidine (PEPCID) 20 MG tablet Take 20 mg by mouth at bedtime.    [provider]  glyBURIDE (DIABETA) 2.5 MG tablet Take 2.5 mg by mouth daily with breakfast.    [provider]  LACTOBACILLUS PROBIOTIC PO Take 1 tablet by mouth daily.    [provider]  losartan (COZAAR) 25 MG tablet Take 1 tablet (25 mg total) by mouth daily. 08/10/20   Marguerita Merles Latif, DO  metFORMIN (GLUCOPHAGE) 1000 MG tablet Take 1,000  mg by mouth daily.    [provider]  nitroGLYCERIN (NITROSTAT) 0.4 MG SL tablet Place 0.4 mg under the tongue every 5 (five) minutes as needed for chest pain.    [provider]  Omega-3 Fatty Acids (FISH OIL) 1200 MG CPDR Take 1,200 mg by mouth daily.    [provider]  ondansetron (ZOFRAN) 4 MG tablet Take 1 tablet (4 mg total) by mouth every 6 (six) hours as needed for nausea. 08/10/20   Marguerita Merles Latif, DO  pantoprazole (PROTONIX) 40 MG tablet Take 40 mg by mouth daily.  07/19/14   [provider]  polyvinyl alcohol (LIQUIFILM TEARS) 1.4 % ophthalmic solution Place 1 drop into both eyes 2 (two) times daily as needed for dry eyes.     [provider]  pravastatin (PRAVACHOL) 40 MG tablet Take 40 mg by mouth daily.    [provider]  psyllium (HYDROCIL/METAMUCIL) 95 % PACK Take 1 packet by mouth daily. 09/21/20   Rachael Fee, MD      Allergies    Contrast media [iodinated contrast media] and Quinine derivatives    Review of Systems   Review of Systems  Neurological:  Positive for weakness. Negative for dizziness.    Physical Exam Updated Vital Signs BP (!) 159/66   Pulse 86   Temp 98.7 F (37.1 C) (Oral)   Resp 18   SpO2 96%  Physical Exam Vitals and nursing note reviewed.  Constitutional:      General: He is not in acute distress.    Appearance: He is well-developed.  HENT:     Head: Normocephalic and atraumatic.  Eyes:     Conjunctiva/sclera: Conjunctivae normal.  Cardiovascular:     Rate and Rhythm: Normal rate and regular rhythm.     Heart sounds: No murmur heard. Pulmonary:     Effort: Pulmonary effort is normal. No respiratory distress.     Breath sounds: Normal breath sounds.  Abdominal:     Palpations: Abdomen is soft.     Tenderness: There is no abdominal tenderness.  Musculoskeletal:     Cervical back: Neck supple.     Comments: +TTP of ulnar aspect of L wrist w/palpable nodule that is erythematous. TTP midline cervical spine.   Skin:    General: Skin is warm and dry.     Capillary Refill: Capillary refill takes less than 2 seconds.  Neurological:     Mental Status: He is alert.     Motor: Weakness present.     Gait: Gait abnormal.     Comments: 4/5 strength of BUE and BLE  Psychiatric:        Mood and Affect: Mood normal.     ED Results / Procedures / Treatments   Labs (all labs ordered are listed, but only abnormal results are displayed) Labs Reviewed  COMPREHENSIVE METABOLIC PANEL - Abnormal; Notable for the following components:      Result Value   CO2 21 (*)    Glucose, Bld 265 (*)    BUN 26 (*)    Creatinine, Ser 1.59 (*)    Total Protein 6.0 (*)     Albumin 3.1 (*)    GFR, Estimated 44 (*)    All other components within normal limits  CBC WITH DIFFERENTIAL/PLATELET - Abnormal; Notable for the following components:   WBC 11.9 (*)    RBC 3.54 (*)    Hemoglobin 10.5 (*)    HCT 32.1 (*)    Neutro Abs  9.3 (*)    Monocytes Absolute 1.1 (*)    All other components within normal limits  URINALYSIS, W/ REFLEX TO CULTURE (INFECTION SUSPECTED)  TROPONIN I (HIGH SENSITIVITY)  TROPONIN I (HIGH SENSITIVITY)    EKG EKG Interpretation  Date/Time:  Sunday Oct 07 2022 09:51:07 EDT Ventricular Rate:  72 PR Interval:  163 QRS Duration: 92 QT Interval:  379 QTC Calculation: 415 R Axis:   -36 Text Interpretation: Sinus rhythm Left axis deviation Abnormal R-wave progression, late transition No significant change since last tracing Confirmed by Jacalyn Lefevre [phone]) on 10/07/2022 10:05:53 AM  Radiology DG Chest 2 View  Result Date: 10/07/2022 CLINICAL DATA:  Fall EXAM: CHEST - 2 VIEW COMPARISON:  07/30/2014 radiograph FINDINGS: The cardiomediastinal silhouette is unremarkable. There is no evidence of focal airspace disease, pulmonary edema, suspicious pulmonary nodule/mass, pleural effusion, or pneumothorax. No acute bony abnormalities are identified. IMPRESSION: No active cardiopulmonary disease. Electronically Signed   By: Harmon Pier M.D.   On: 10/07/2022 13:00   DG Wrist Complete Right  Result Date: 10/07/2022 CLINICAL DATA:  Acute RIGHT wrist pain following fall. Initial encounter. EXAM: RIGHT WRIST - COMPLETE 3+ VIEW COMPARISON:  None Available. FINDINGS: There is no evidence of acute fracture or dislocation. Mild degenerative changes of the radiocarpal and lateral carpal joints noted. Soft tissue swelling noted. IMPRESSION: Soft tissue swelling without acute bony abnormality. Electronically Signed   By: Harmon Pier M.D.   On: 10/07/2022 12:56   MR LUMBAR SPINE WO CONTRAST  Result Date: 10/07/2022 CLINICAL DATA:  77 year old male with  weakness, spinal stenosis. EXAM: MRI LUMBAR SPINE WITHOUT CONTRAST TECHNIQUE: Multiplanar, multisequence MR imaging of the lumbar spine was performed. No intravenous contrast was administered. COMPARISON:  CT Abdomen and Pelvis 08/06/2020. FINDINGS: Segmentation: Transitional anatomy based on the previous CT with mostly lumbarized S1 level suspected. S1-S2 assimilation joints, full size disc there. Correlation with radiographs is recommended prior to any operative intervention. Alignment: Mildly exaggerated lumbar lordosis is stable since 2022. Subtle degenerative appearing retrolisthesis of L4 on L5. Mild levoconvex lumbar scoliosis. Vertebrae: Chronic degenerative endplate marrow signal changes. Postoperative changes to the lower lumbar spine described below. No marrow edema or evidence of acute osseous abnormality. Intact visible sacrum and SI joints. Conus medullaris and cauda equina: Conus extends to the T12 level. No lower spinal cord or conus signal abnormality. Cauda equina nerve roots are within normal limits. Paraspinal and other soft tissues: Postoperative changes to the lower lumbar posterior paraspinal soft tissues with no adverse features. Partially visible urinary bladder distension, similar to the 2022 CT. Mild tortuosity of the abdominal aorta appears stable. Disc levels: Mild for age degeneration and no spinal stenosis from the visible T11-T12 level through L1-L2. L2-L3: Minor disc bulging. Up to moderate facet and ligament flavum hypertrophy. No spinal or convincing foraminal stenosis. L3-L4: Mild disc bulge. Moderate facet and ligament flavum hypertrophy. No spinal or lateral recess stenosis. Mild left, moderate right L3 foraminal stenosis. L4-L5: Mild retrolisthesis. Mild disc bulge. Moderate facet and ligament flavum hypertrophy. No spinal stenosis. Mild lateral recess stenosis greater on the left (left L5 nerve level). Severe left and moderate to severe right L4 neural foraminal stenosis.  L5-S1: Previous posterior decompression, laminectomy. Severe residual facet hypertrophy. Circumferential disc bulge and endplate spurring. No spinal stenosis. Mild left lateral recess stenosis which appears in part related to postoperative architectural distortion (left S1 nerve level). Moderate to severe bilateral L5 neural foraminal stenosis. S1-S2: Mostly lumbarized. Facet hypertrophy on the right. Epidural lipomatosis.  No stenosis. IMPRESSION: 1. 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 Correlation with radiographs is recommended prior to any operative intervention. 2. Previous posterior decompressive laminectomy at L5-S1. No significant lumbar spinal stenosis there or elsewhere. But moderate to severe bilateral L5 neural foraminal stenosis, in part due to bulky residual facet hypertrophy. 3. Adjacent segment L4-L5 degeneration with bulky disc and facet disease. Only mild lateral recess stenosis but severe bilateral L4 neural foraminal stenosis. 4. Up to moderate neural foraminal stenosis also on the right at L3-L4. Electronically Signed   By: Odessa Fleming M.D.   On: 10/07/2022 12:34   MR Cervical Spine Wo Contrast  Result Date: 10/07/2022 CLINICAL DATA:  77 year old male with radiculopathy. EXAM: MRI CERVICAL SPINE WITHOUT CONTRAST TECHNIQUE: Multiplanar, multisequence MR imaging of the cervical spine was performed. No intravenous contrast was administered. COMPARISON:  Brain MRI today reported separately. FINDINGS: Alignment: Only mild straightening of cervical lordosis. No significant scoliosis or spondylolisthesis. Vertebrae: Normal background bone marrow signal. No marrow edema or evidence of acute osseous abnormality. Degenerative endplate and facet marrow signal changes in the lower cervical spine. Cord: No cervical cord signal abnormality despite degenerative cord mass effect at C5-C6, detailed below. Negative visible upper thoracic canal and cord. Posterior Fossa,  vertebral arteries, paraspinal tissues: Cervicomedullary junction is within normal limits. Brain is detailed separately. Major vascular flow voids in the neck are preserved, right vertebral artery appears somewhat dominant. Negative visible neck soft tissues, lung apices. Disc levels: C2-C3: Mild disc bulging or protrusion including Tamina Cyphers midline component. Mild to moderate facet and ligament flavum hypertrophy greater on the right. No spinal stenosis. Mild to moderate bilateral C3 foraminal stenosis. C3-C4: Negative disc but leftward endplate spurring. Moderate to severe left side facet hypertrophy. No spinal stenosis. Mild to moderate facet hypertrophy on the right. Mild to moderate right and severe left C4 foraminal stenosis. C4-C5: Mild circumferential disc bulge and endplate spurring. Symmetric mild to moderate facet hypertrophy. No spinal stenosis. Moderate to severe bilateral C5 foraminal stenosis. C5-C6: Disc space loss. Bulky circumferential disc osteophyte complex. Broad-based posterior and biforaminal involvement with superimposed mild to moderate facet and ligament flavum hypertrophy. Mild spinal stenosis and spinal cord mass effect. Severe bilateral C6 foraminal stenosis. C6-C7: Circumferential disc bulge with endplate spurring. Broad-based posterior component but foramina mostly are affected. No spinal stenosis. Severe bilateral C7 foraminal stenosis. C7-T1: Mild endplate spurring. Mild to moderate facet hypertrophy greater on the right. No spinal stenosis. Mild right C8 foraminal stenosis. IMPRESSION: 1. Cervical spine degeneration. No acute osseous abnormality. 2. Multifactorial spinal stenosis at C5-C6 with mild spinal cord mass effect. No cord signal abnormality. 3. No other significant spinal stenosis. But moderate or severe multifactorial neural foraminal stenosis at the left C4, bilateral C5, C6, and C7 nerve levels. Electronically Signed   By: Odessa Fleming M.D.   On: 10/07/2022 12:22   MR BRAIN WO  CONTRAST  Result Date: 10/07/2022 CLINICAL DATA:  Multiple falls. Weakness in the upper extremities bilaterally. EXAM: MRI HEAD WITHOUT CONTRAST TECHNIQUE: Multiplanar, multiecho pulse sequences of the brain and surrounding structures were obtained without intravenous contrast. COMPARISON:  None Available. FINDINGS: Brain: Right hemisphere hemi atrophy is noted. This is likely chronic. There is expansion of the extra-axial spaces. No mass effect or midline shift is present. Periventricular and scattered subcortical T2 hyperintensities are mildly advanced for age likely reflecting the sequela of chronic microvascular ischemia. No acute infarct, hemorrhage, or mass lesion is present. The ventricles are proportionate to the degree  of atrophy. No significant extraaxial fluid collection is present. Remote lacunar infarcts are present in the cerebellum bilaterally. The brainstem is within normal limits. Vascular: Flow is present in the major intracranial arteries. Skull and upper cervical spine: The craniocervical junction is normal. Upper cervical spine is within normal limits. Marrow signal is unremarkable. Sinuses/Orbits: The paranasal sinuses and mastoid air cells are clear. Bilateral lens replacements are noted. Globes and orbits are otherwise unremarkable. IMPRESSION: 1. No acute intracranial abnormality. 2. Periventricular and scattered subcortical T2 hyperintensities are mildly advanced for age. This likely reflects the sequela of chronic microvascular ischemia. 3. Remote lacunar infarcts of the cerebellum bilaterally. Electronically Signed   By: Marin Roberts M.D.   On: 10/07/2022 12:17   DG Wrist Complete Left  Result Date: 10/07/2022 CLINICAL DATA:  Pain.  Fall.  Lateral wrist pain. EXAM: LEFT WRIST - COMPLETE 3+ VIEW COMPARISON:  None Available. FINDINGS: A remote ulnar styloid fracture is present. Soft tissue swelling is present along the ulna. No acute fractures are present. Degenerative changes  are present at the STT joint and first [facility] joint. IMPRESSION: 1. Soft tissue swelling along the ulna without acute fracture. 2. Remote ulnar styloid fracture. Electronically Signed   By: Marin Roberts M.D.   On: 10/07/2022 10:30    Procedures Procedures    Medications Ordered in ED Medications  diazepam (VALIUM) injection 2.5 mg (has no administration in time range)  morphine (PF) 4 MG/ML injection 4 mg (4 mg Intravenous Given 10/07/22 1104)    ED Course/ Medical Decision Making/ A&P                             Medical Decision Making Patient is a 78 year old male, here for weakness, frequent falls, and now severe pain down bilateral arms.  Has been going on for the last 2 years the weakness, but the arm issue has been going on for the last 5 weeks, and progressively gotten worse.  Wife brought him here because pain is just so bad.  He does have tenderness to palpation of his cervical spine, and also has tenderness to palpation over the ulnar aspect of the left wrist.  We will obtain a brain, cervical, and lumbar MRI for further evaluation for possible old CVA for disequilibrium purposes, versus spinal cord change in cervical or lumbar spine given weakness.  Amount and/or Complexity of Data Reviewed Labs: ordered.    Details: Mild leukocytosis, patient refused to give urine Radiology: ordered.    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  Additionally findings of ulnar styloid fracture. ECG/medicine tests:     Details: Normal sinus rhythm Discussion of management or test interpretation with external provider(s): Discussed with patient, his cervical MRI, shows some cervical mass effect, but no cord changes, with severe neural foraminal stenosis, this may be reason why he has severe pain bilateral arms, we discussed  possibly prescribed pain medications for this, and he declined.  Given his imbalance issue it may be a more progressive neurologic disorder, versus secondary to old cerebellar lacunar infarcts.  Discussed with patient, offered home health,, versus SNF, and family declined.  We discussed the importance of following up with neurology, neurosurgery given the cervical spine pain, from the neuroforaminal stenosis.  They voiced understanding, they declined urinalysis, we will give him Keflex given his  red ganglion cyst.  Discussed return precautions, they voiced understanding given information for neurology, orthopedics.  Discussed with patient also ulnar styloid fracture, likely old given that he fell several weeks ago, does not appear to be acute.  We discussed using removable wrist splint, versus no splint, he declined at this time.  Follow-up with orthopedics.  Risk Prescription drug management.   Final Clinical Impression(s) / ED Diagnoses Final diagnoses:  Foraminal stenosis of cervical region  Foraminal stenosis of lumbar region  Closed nondisplaced fracture of styloid process of left ulna, initial encounter  Ganglion cyst  Weakness  Cellulitis of left upper extremity    Rx / DC Orders ED Discharge Orders          Ordered    cephALEXin (KEFLEX) 500 MG capsule  4 times daily        10/07/22 1443              Draven Natter Elbert Ewings, PA 10/07/22 1501    Leldon Steege, Harley Alto, PA 10/07/22 1502    Jacalyn Lefevre, MD 10/16/22 1644

## 2022-10-07 NOTE — ED Triage Notes (Addendum)
Pt/wife stated, Ive had arm pain and left arm tenderness, Its only if I try to move or if someone touches them. this started 5 weeks ago. He falls all the time going backwards. [facility] but [facility] not get [facility] til July

## 2022-10-07 NOTE — Discharge Instructions (Addendum)
Please follow-up with your primary care doctor, neurology, and orthopedics.  You are offered pain medication for your surgery cervical radiculopathy but declined, take Tylenol at home for this pain.  Return to ER if you have a fall, hit your head, have sudden onset weakness or weakness on one side of your body.  If you have any loss of bowel, bladder please return to the ER.

## 2022-11-02 ENCOUNTER — Other Ambulatory Visit: Payer: Self-pay

## 2022-11-02 ENCOUNTER — Emergency Department (HOSPITAL_COMMUNITY): Payer: No Typology Code available for payment source

## 2022-11-02 ENCOUNTER — Inpatient Hospital Stay (HOSPITAL_COMMUNITY)
Admission: EM | Admit: 2022-11-02 | Discharge: 2022-11-12 | DRG: 872 | Disposition: A | Discharge status: Skilled Nursing Facility | Payer: No Typology Code available for payment source | Attending: Internal Medicine | Admitting: Internal Medicine

## 2022-11-02 DIAGNOSIS — E1122 Type 2 diabetes mellitus with diabetic chronic kidney disease: Secondary | ICD-10-CM | POA: Diagnosis present

## 2022-11-02 DIAGNOSIS — N400 Enlarged prostate without lower urinary tract symptoms: Secondary | ICD-10-CM | POA: Diagnosis present

## 2022-11-02 DIAGNOSIS — Z85828 Personal history of other malignant neoplasm of skin: Secondary | ICD-10-CM | POA: Diagnosis not present

## 2022-11-02 DIAGNOSIS — E119 Type 2 diabetes mellitus without complications: Secondary | ICD-10-CM

## 2022-11-02 DIAGNOSIS — N3000 Acute cystitis without hematuria: Secondary | ICD-10-CM | POA: Diagnosis present

## 2022-11-02 DIAGNOSIS — Z801 Family history of malignant neoplasm of trachea, bronchus and lung: Secondary | ICD-10-CM | POA: Diagnosis not present

## 2022-11-02 DIAGNOSIS — Z955 Presence of coronary angioplasty implant and graft: Secondary | ICD-10-CM

## 2022-11-02 DIAGNOSIS — N1831 Chronic kidney disease, stage 3a: Secondary | ICD-10-CM | POA: Diagnosis present

## 2022-11-02 DIAGNOSIS — A4151 Sepsis due to Escherichia coli [E. coli]: Secondary | ICD-10-CM | POA: Diagnosis present

## 2022-11-02 DIAGNOSIS — E785 Hyperlipidemia, unspecified: Secondary | ICD-10-CM | POA: Diagnosis present

## 2022-11-02 DIAGNOSIS — Z91041 Radiographic dye allergy status: Secondary | ICD-10-CM | POA: Diagnosis not present

## 2022-11-02 DIAGNOSIS — Z9889 Other specified postprocedural states: Secondary | ICD-10-CM

## 2022-11-02 DIAGNOSIS — Z79899 Other long term (current) drug therapy: Secondary | ICD-10-CM

## 2022-11-02 DIAGNOSIS — Z7902 Long term (current) use of antithrombotics/antiplatelets: Secondary | ICD-10-CM

## 2022-11-02 DIAGNOSIS — N390 Urinary tract infection, site not specified: Secondary | ICD-10-CM | POA: Diagnosis not present

## 2022-11-02 DIAGNOSIS — I129 Hypertensive chronic kidney disease with stage 1 through stage 4 chronic kidney disease, or unspecified chronic kidney disease: Secondary | ICD-10-CM | POA: Diagnosis present

## 2022-11-02 DIAGNOSIS — Z7984 Long term (current) use of oral hypoglycemic drugs: Secondary | ICD-10-CM | POA: Diagnosis not present

## 2022-11-02 DIAGNOSIS — F0390 Unspecified dementia without behavioral disturbance: Secondary | ICD-10-CM | POA: Diagnosis present

## 2022-11-02 DIAGNOSIS — E1151 Type 2 diabetes mellitus with diabetic peripheral angiopathy without gangrene: Secondary | ICD-10-CM | POA: Diagnosis present

## 2022-11-02 DIAGNOSIS — Z597 Insufficient social insurance and welfare support: Secondary | ICD-10-CM | POA: Diagnosis not present

## 2022-11-02 DIAGNOSIS — I2510 Atherosclerotic heart disease of native coronary artery without angina pectoris: Secondary | ICD-10-CM | POA: Diagnosis present

## 2022-11-02 DIAGNOSIS — Z1152 Encounter for screening for COVID-19: Secondary | ICD-10-CM | POA: Diagnosis not present

## 2022-11-02 DIAGNOSIS — R6520 Severe sepsis without septic shock: Secondary | ICD-10-CM | POA: Diagnosis present

## 2022-11-02 DIAGNOSIS — Z7982 Long term (current) use of aspirin: Secondary | ICD-10-CM

## 2022-11-02 DIAGNOSIS — A419 Sepsis, unspecified organism: Secondary | ICD-10-CM | POA: Diagnosis not present

## 2022-11-02 DIAGNOSIS — Z841 Family history of disorders of kidney and ureter: Secondary | ICD-10-CM

## 2022-11-02 DIAGNOSIS — Z888 Allergy status to other drugs, medicaments and biological substances status: Secondary | ICD-10-CM

## 2022-11-02 DIAGNOSIS — Z800 Family history of malignant neoplasm of digestive organs: Secondary | ICD-10-CM

## 2022-11-02 DIAGNOSIS — G9341 Metabolic encephalopathy: Principal | ICD-10-CM

## 2022-11-02 DIAGNOSIS — Z87891 Personal history of nicotine dependence: Secondary | ICD-10-CM

## 2022-11-02 DIAGNOSIS — N179 Acute kidney failure, unspecified: Secondary | ICD-10-CM | POA: Diagnosis present

## 2022-11-02 LAB — URINALYSIS, ROUTINE W REFLEX MICROSCOPIC
Bilirubin Urine: NEGATIVE
Glucose, UA: 50 mg/dL — AB
Hgb urine dipstick: NEGATIVE
Ketones, ur: NEGATIVE mg/dL
Nitrite: NEGATIVE
Protein, ur: 100 mg/dL — AB
Specific Gravity, Urine: 1.017 (ref 1.005–1.030)
WBC, UA: >50 WBC/hpf (ref 0–5)
pH: 7 (ref 5.0–8.0)

## 2022-11-02 LAB — CBC
HCT: 33.3 % — ABNORMAL LOW (ref 39.0–52.0)
Hemoglobin: 11.2 g/dL — ABNORMAL LOW (ref 13.0–17.0)
MCH: 30.4 pg (ref 26.0–34.0)
MCHC: 33.6 g/dL (ref 30.0–36.0)
MCV: 90.5 fL (ref 80.0–100.0)
Platelets: 261 10*3/uL (ref 150–400)
RBC: 3.68 MIL/uL — ABNORMAL LOW (ref 4.22–5.81)
RDW: 13.5 % (ref 11.5–15.5)
WBC: 16.7 10*3/uL — ABNORMAL HIGH (ref 4.0–10.5)
nRBC: 0 % (ref 0.0–0.2)

## 2022-11-02 LAB — BASIC METABOLIC PANEL
Anion gap: 13 (ref 5–15)
BUN: 24 mg/dL — ABNORMAL HIGH (ref 8–23)
CO2: 23 mmol/L (ref 22–32)
Calcium: 9.5 mg/dL (ref 8.9–10.3)
Chloride: 100 mmol/L (ref 98–111)
Creatinine, Ser: 1.51 mg/dL — ABNORMAL HIGH (ref 0.61–1.24)
GFR, Estimated: 47 mL/min — ABNORMAL LOW (ref >60)
Glucose, Bld: 232 mg/dL — ABNORMAL HIGH (ref 70–99)
Potassium: 4.5 mmol/L (ref 3.5–5.1)
Sodium: 136 mmol/L (ref 135–145)

## 2022-11-02 LAB — HEPATIC FUNCTION PANEL
ALT: 48 U/L — ABNORMAL HIGH (ref 0–44)
AST: 25 U/L (ref 15–41)
Albumin: 3.7 g/dL (ref 3.5–5.0)
Alkaline Phosphatase: 76 U/L (ref 38–126)
Bilirubin, Direct: 0.1 mg/dL (ref 0.0–0.2)
Indirect Bilirubin: 0.6 mg/dL (ref 0.3–0.9)
Total Bilirubin: 0.7 mg/dL (ref 0.3–1.2)
Total Protein: 7.1 g/dL (ref 6.5–8.1)

## 2022-11-02 LAB — CBG MONITORING, ED: Glucose-Capillary: 243 mg/dL — ABNORMAL HIGH (ref 70–99)

## 2022-11-02 LAB — LACTIC ACID, PLASMA: Lactic Acid, Venous: 3.4 mmol/L (ref 0.5–1.9)

## 2022-11-02 LAB — PROTIME-INR
INR: 1.1 (ref 0.8–1.2)
Prothrombin Time: 14.8 seconds (ref 11.4–15.2)

## 2022-11-02 LAB — SARS CORONAVIRUS 2 BY RT PCR: SARS Coronavirus 2 by RT PCR: NEGATIVE

## 2022-11-02 LAB — APTT: aPTT: 31 seconds (ref 24–36)

## 2022-11-02 MED ORDER — SODIUM CHLORIDE 0.9 % IV SOLN
2.0000 g | Freq: Once | INTRAVENOUS | Status: AC
  Administered 2022-11-02: 2 g via INTRAVENOUS
  Filled 2022-11-02: qty 20

## 2022-11-02 MED ORDER — SODIUM CHLORIDE 0.9 % IV BOLUS
500.0000 mL | Freq: Once | INTRAVENOUS | Status: AC
  Administered 2022-11-02: 500 mL via INTRAVENOUS

## 2022-11-02 MED ORDER — ONDANSETRON HCL 4 MG/2ML IJ SOLN
4.0000 mg | Freq: Once | INTRAMUSCULAR | Status: AC
  Administered 2022-11-02: 4 mg via INTRAVENOUS
  Filled 2022-11-02: qty 2

## 2022-11-02 MED ORDER — SODIUM CHLORIDE 0.9 % IV BOLUS
1000.0000 mL | Freq: Once | INTRAVENOUS | Status: AC
  Administered 2022-11-02: 1000 mL via INTRAVENOUS

## 2022-11-02 NOTE — ED Triage Notes (Signed)
Patient coming to ED for evaluation of weakness and confusion per wife.  Patient has no current complaints.  States "I woke up this morning and both my legs were weak.  My arms were hurting too."  Alert and oriented on arrival.  No reports of pain currently.

## 2022-11-02 NOTE — ED Provider Notes (Signed)
[place] EMERGENCY DEPARTMENT AT [facility] Provider Note   CSN: [id number] Arrival date & time: 11/02/22  2023     History  Chief Complaint  Patient presents with   Weakness    [name] is a 77 y.o. male.  Patient presents to the emergency department today for evaluation of increasing confusion and weakness.  Most of the patient's history given by his wife, however he does contribute.  Patient has had months to years of worsening balance and gait as well as waxing and waning weakness which causes him to have difficulty standing up from a sitting position.  His symptoms have been waxing and waning recently, but were much worse this morning.  Patient slept for 12 hours last night.  He had difficulty and needed assistance getting up in the bathroom today.  This afternoon he could not get up from his chair, prompting EMS visit.  On arrival patient has a temperature of 101.5 F.  Patient has not had any focal symptoms.  No URI symptoms, headache, runny nose, sore throat or congestion.  He has not been coughing.  No vomiting.  No history of UTI denies any urine changes.  He did have an episode of loose stool today without blood.  No skin rashes.  He did receive a shot of steroids in his wrists a week ago by the orthopedist.  Wife states that his balance and gait issues temporarily improved after this.       Home Medications Prior to Admission medications   Medication Sig Start Date End Date Taking? Authorizing Provider  acetaminophen (TYLENOL) 500 MG tablet Take 2 tablets (1,000 mg total) by mouth every 6 (six) hours as needed. Patient taking differently: Take 1,000 mg by mouth every 6 (six) hours as needed for mild pain. 07/30/14   Arby Barrette, MD  amLODipine (NORVASC) 10 MG tablet Take 1 tablet (10 mg total) by mouth daily. 08/10/20   Marguerita Merles Latif, DO  carvedilol (COREG) 3.125 MG tablet Take 3.125 mg by mouth 2 (two) times daily with a meal.    [provider]  cephALEXin (KEFLEX) 500 MG capsule Take 1 capsule (500 mg total) by mouth 4 (four) times daily. 10/07/22   Small, Brooke L, PA  cetirizine (ZYRTEC) 10 MG tablet Take 10 mg by mouth daily.    [provider]  cholecalciferol (VITAMIN D3) 25 MCG (1000 UNIT) tablet Take 2,000 Units by mouth every 14 (fourteen) days.    [provider]  clopidogrel (PLAVIX) 75 MG tablet Take 1 tablet (75 mg total) by mouth daily. 08/11/20   Marguerita Merles Latif, DO  donepezil (ARICEPT) 5 MG tablet Take 10 mg by mouth at bedtime. 05/03/20   [provider]  DULoxetine (CYMBALTA) 20 MG capsule Take 20 mg by mouth daily.    [provider]  famotidine (PEPCID) 20 MG tablet Take 20 mg by mouth at bedtime.    [provider]  glyBURIDE (DIABETA) 2.5 MG tablet Take 2.5 mg by mouth daily with breakfast.    [provider]  LACTOBACILLUS PROBIOTIC PO Take 1 tablet by mouth daily.    [provider]  losartan (COZAAR) 25 MG tablet Take 1 tablet (25 mg total) by mouth daily. 08/10/20   Marguerita Merles Latif, DO  metFORMIN (GLUCOPHAGE) 1000 MG tablet Take 1,000 mg by mouth daily.    [provider]  nitroGLYCERIN (NITROSTAT) 0.4 MG SL tablet Place 0.4 mg under the tongue every 5 (five)  minutes as needed for chest pain.    [provider]  Omega-3 Fatty Acids (FISH OIL) 1200 MG CPDR Take 1,200 mg by mouth daily.    [provider]  ondansetron (ZOFRAN) 4 MG tablet Take 1 tablet (4 mg total) by mouth every 6 (six) hours as needed for nausea. 08/10/20   Marguerita Merles Latif, DO  pantoprazole (PROTONIX) 40 MG tablet Take 40 mg by mouth daily.  07/19/14   [provider]  polyvinyl alcohol (LIQUIFILM TEARS) 1.4 % ophthalmic solution Place 1 drop into both eyes 2 (two) times daily as needed for dry eyes.    [provider]  pravastatin (PRAVACHOL) 40 MG tablet Take 40 mg by mouth daily.    [provider]   psyllium (HYDROCIL/METAMUCIL) 95 % PACK Take 1 packet by mouth daily. 09/21/20   Rachael Fee, MD      Allergies    Contrast media [iodinated contrast media] and Quinine derivatives    Review of Systems   Review of Systems  Physical Exam Updated Vital Signs BP (!) 162/68 (BP Location: Right Arm)   Pulse 95   Temp (!) 101.5 F (38.6 C) (Oral)   Resp 18   Ht 5\' 9"  (1.753 m)   Wt 90.3 kg   SpO2 100%   BMI 29.39 kg/m  Physical Exam Vitals and nursing note reviewed.  Constitutional:      General: He is not in acute distress.    Appearance: He is well-developed.  HENT:     Head: Normocephalic and atraumatic.     Right Ear: Tympanic membrane, ear canal and external ear normal.     Left Ear: Tympanic membrane, ear canal and external ear normal.     Nose: Nose normal. No congestion.     Mouth/Throat:     Mouth: Mucous membranes are moist.  Eyes:     General:        Right eye: No discharge.        Left eye: No discharge.     Conjunctiva/sclera: Conjunctivae normal.  Cardiovascular:     Rate and Rhythm: Normal rate and regular rhythm.     Heart sounds: Murmur heard.  Pulmonary:     Effort: Pulmonary effort is normal.     Breath sounds: Normal breath sounds.  Abdominal:     Palpations: Abdomen is soft.     Tenderness: There is no abdominal tenderness. There is no guarding or rebound.  Musculoskeletal:     Cervical back: Normal range of motion and neck supple.     Right lower leg: Edema present.     Left lower leg: Edema present.     Comments: Trace edema of the ankles, bilaterally, symmetric.  Skin:    General: Skin is warm and dry.  Neurological:     Mental Status: He is alert.     Motor: No weakness.     Comments: No focal weakness.  Speech is clear.  Patient oriented.     ED Results / Procedures / Treatments   Labs (all labs ordered are listed, but only abnormal results are displayed) Labs Reviewed  CBC - Abnormal; Notable for the following components:       Result Value   WBC 16.7 (*)    RBC 3.68 (*)    Hemoglobin 11.2 (*)    HCT 33.3 (*)    All other components within normal limits  CBG MONITORING, ED - Abnormal; Notable for the following components:   Glucose-Capillary  243 (*)    All other components within normal limits  CULTURE, BLOOD (ROUTINE X 2)  CULTURE, BLOOD (ROUTINE X 2)  SARS CORONAVIRUS 2 BY RT PCR  BASIC METABOLIC PANEL  URINALYSIS, ROUTINE W REFLEX MICROSCOPIC  LACTIC ACID, PLASMA  LACTIC ACID, PLASMA  PROTIME-INR  APTT  HEPATIC FUNCTION PANEL    EKG EKG Interpretation  Date/Time:  Friday November 02 2022 20:32:01 EDT Ventricular Rate:  88 PR Interval:  150 QRS Duration: 80 QT Interval:  330 QTC Calculation: 399 R Axis:   -39 Text Interpretation: Normal sinus rhythm Left axis deviation Minimal voltage criteria for LVH, may be normal variant ( R in aVL ) Abnormal ECG When compared with ECG of 07-Oct-2022 09:51, No significant change since last tracing Confirmed by Meridee Score [phone]) on 11/02/2022 8:47:43 PM  Radiology DG Chest Port 1 View  Result Date: 11/02/2022 CLINICAL DATA:  Question of sepsis to evaluate for abnormality. Weakness in the arms and legs. EXAM: PORTABLE CHEST 1 VIEW COMPARISON:  10/07/2022 FINDINGS: Shallow inspiration. Heart size and pulmonary vascularity are normal for technique. Lungs are clear. No pleural effusions. No pneumothorax. Mediastinal contours appear intact. Calcification of the aorta. IMPRESSION: Shallow inspiration.  No evidence of active pulmonary disease. Electronically Signed   By: Burman Nieves M.D.   On: 11/02/2022 21:58    Procedures Procedures    Medications Ordered in ED Medications - No data to display  ED Course/ Medical Decision Making/ A&P Clinical Course as of 11/02/22 2242  Fri Nov 02, 2022  1915 77 year old male with weakness confusion febrile here.  Presumptive sepsis possible urine as source.  Getting fluids and antibiotics, will likely need admission  for further workup. [MB]    Clinical Course User Index [MB] Terrilee Files, MD    Patient seen and examined. History obtained directly from patient.   Labs/EKG: Ordered sepsis protocol.  This includes CBC, CMP, PT/INR and APTT as marker for endorgan damage, UA, COVID testing, lactate, blood cultures  Imaging: Ordered chest x-ray  Medications/Fluids: None ordered, patient is not hypotensive or tachycardic.  Most recent vital signs reviewed and are as follows: BP (!) 162/68 (BP Location: Right Arm)   Pulse 95   Temp (!) 101.5 F (38.6 C) (Oral)   Resp 18   Ht 5\' 9"  (1.753 m)   Wt 90.3 kg   SpO2 100%   BMI 29.39 kg/m   Initial impression: Acute on chronic weakness, likely in setting of new onset of infection.  Currently evaluating for source.  Patient will likely need admission.  10:43 PM Reassessment performed. Patient appears stable.  MAP greater than 65, lactate less than 4 so 30 cc/kg fluid bolus not ordered.  Labs personally reviewed and interpreted including: CBC with elevated white blood cell count at 16.7, hemoglobin slightly low at 11.2 otherwise unremarkable; BMP with glucose elevated at 232, creatinine elevated at 1.51 appears chronic with a BUN of 24, normal electrolytes; hepatic function panel normal; PT/INR and APTT were normal; lactate elevated at 3.4; UA with greater than 50 white blood cells per high-power field.  Imaging personally visualized and interpreted including: Chest x-ray, agree negative.  Reviewed pertinent lab work and imaging with patient at bedside. Questions answered.   Most current vital signs reviewed and are as follows: BP (!) 162/68 (BP Location: Right Arm)   Pulse 95   Temp (!) 101.5 F (38.6 C) (Oral)   Resp 18   Ht 5\' 9"  (1.753 m)   Wt 90.3 kg  SpO2 100%   BMI 29.39 kg/m   Plan: Admit to hospital.  2 g Rocephin IV ordered.  Will hydrate 2 L total.  Patient discussed with and seen by: Dr. Charm Barges  I have spoken with Dr. Mikeal Hawthorne  of [facility] hospitalist who will see patient for admission.  CRITICAL CARE Performed by: Renne Crigler PA-C Total critical care time: 35 minutes Critical care time was exclusive of separately billable procedures and treating other patients. Critical care was necessary to treat or prevent imminent or life-threatening deterioration. Critical care was time spent personally by me on the following activities: development of treatment plan with patient and/or surrogate as well as nursing, discussions with consultants, evaluation of patient's response to treatment, examination of patient, obtaining history from patient or surrogate, ordering and performing treatments and interventions, ordering and review of laboratory studies, ordering and review of radiographic studies, pulse oximetry and re-evaluation of patient's condition.                             Medical Decision Making Amount and/or Complexity of Data Reviewed Labs: ordered. Radiology: ordered.  Risk Prescription drug management. Decision regarding hospitalization.   Patient with fever, acute on chronic weakness and gait instability.  Workup so far pointing towards a urinary source.  Abdomen is soft and nontender and I have low concern for any acute abdominal pathology at this time.  Chest x-ray is clear without signs of pneumonia.  No joint inflammation or skin rashes.  Patient will be admitted for IV fluids, IV antibiotics and monitoring of sepsis.        Final Clinical Impression(s) / ED Diagnoses Final diagnoses:  Sepsis with encephalopathy without septic shock, due to unspecified organism [facility])  Acute cystitis without hematuria    Rx / DC Orders ED Discharge Orders     None         Renne Crigler, PA-C 11/02/22 2248    Terrilee Files, MD 11/03/22 1051

## 2022-11-02 NOTE — H&P (Signed)
History and Physical    Patient: [name] ZOX:[id number] DOB: Jun 03, 1945 DOA: 11/02/2022 DOS: the patient was seen and examined on 11/02/2022 PCP: [facility]  Patient coming from: {Point_of_Origin:[address]}  Chief Complaint:  Chief Complaint  Patient presents with   Weakness   HPI: [name] is a 77 y.o. 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 PAC's    GERD (gastroesophageal reflux disease)    Heart murmur    History of kidney stones 07-31-13   x 1   Hyperlipidemia    Hypertension    Peripheral vascular disease (HCC)    Plantar fasciitis of left foot 07-31-13   left leg   PONV (postoperative nausea and vomiting)    Rheumatic fever    when 77 years old   Past Surgical History:  Procedure Laterality Date   BIOPSY  08/10/2020   Procedure: BIOPSY;  Surgeon: Benancio Deeds, MD;  Location: [facility];  Service: Gastroenterology;;   CARDIAC CATHETERIZATION     x1 stents 12'14.   CIRCUMCISION     COLONOSCOPY WITH PROPOFOL N/A 08/10/2020   Procedure: COLONOSCOPY WITH PROPOFOL;  Surgeon: Benancio Deeds, MD;  Location: [facility];  Service: Gastroenterology;  Laterality: N/A;   ESOPHAGOGASTRODUODENOSCOPY (EGD) WITH PROPOFOL N/A 08/10/2020   Procedure: ESOPHAGOGASTRODUODENOSCOPY (EGD) WITH PROPOFOL;  Surgeon: Benancio Deeds, MD;  Location: [facility];  Service: Gastroenterology;  Laterality:  N/A;   FEMORAL-PERONEAL BYPASS GRAFT Right    HEMOSTASIS CLIP PLACEMENT  08/10/2020   Procedure: HEMOSTASIS CLIP PLACEMENT;  Surgeon: Benancio Deeds, MD;  Location: [facility];  Service: Gastroenterology;;   HOT HEMOSTASIS N/A 08/10/2020   Procedure: HOT HEMOSTASIS (ARGON PLASMA COAGULATION/BICAP);  Surgeon: Benancio Deeds, MD;  Location: [facility];  Service: Gastroenterology;  Laterality: N/A;   leg stent Right 07-31-13   right leg   LUMBAR LAMINECTOMY/DECOMPRESSION MICRODISCECTOMY N/A 08/05/2013   Procedure: MICRO LUMBAR DECOMPRESSION L4 - L5;  Surgeon: Javier Docker, MD;  Location: [facility];  Service: Orthopedics;  Laterality: N/A;   LUMBAR LAMINECTOMY/DECOMPRESSION MICRODISCECTOMY N/A 07/22/2015   Procedure: REDO DECOMPRESSION LEVEL 2 L3-4 L4-5;  Surgeon: Jene Every, MD;  Location: [facility];  Service: Orthopedics;  Laterality: N/A;   RENAL ARTERY STENT Right 07-31-13   SHOULDER ARTHROSCOPY Left    RCTR   stent in Right kidney     VASECTOMY     Social History:  reports that he quit smoking about 11 years ago. His smoking use included cigarettes. He has never used smokeless tobacco. He reports that he does not drink alcohol and does not use drugs.  Allergies  Allergen Reactions   Contrast Media [Iodinated Contrast Media] Nausea And Vomiting   Quinine Derivatives Other (See Comments)    Pt does not know     Family History  Problem Relation Age of Onset   Kidney disease  Mother    Pancreatic cancer Mother    Lung cancer Father    Colon cancer Neg Hx    Esophageal cancer Neg Hx    Liver disease Neg Hx    Stomach cancer Neg Hx     Prior to Admission medications   Medication Sig Start Date End Date Taking? Authorizing Provider  Alogliptin Benzoate 25 MG TABS Take 12.5 mg by mouth every evening.   Yes [provider]  amLODipine (NORVASC) 10 MG tablet Take 1 tablet (10 mg total) by mouth daily. 08/10/20  Yes Sheikh, Omair Latif, DO  aspirin EC 81 MG tablet  Take 81 mg by mouth daily. Swallow whole.   Yes [provider]  carvedilol (COREG) 6.25 MG tablet Take 6.25 mg by mouth 2 (two) times daily with a meal.   Yes [provider]  cetirizine (ZYRTEC) 10 MG tablet Take 10 mg by mouth daily.   Yes [provider]  cholecalciferol (VITAMIN D3) 25 MCG (1000 UNIT) tablet Take 2,000 Units by mouth every 14 (fourteen) days.   Yes [provider]  clopidogrel (PLAVIX) 75 MG tablet Take 1 tablet (75 mg total) by mouth daily. 08/11/20  Yes Sheikh, Omair Latif, DO  donepezil (ARICEPT) 10 MG tablet Take 10 mg by mouth in the morning.   Yes [provider]  DULoxetine (CYMBALTA) 20 MG capsule Take 20 mg by mouth every evening.   Yes [provider]  famotidine (PEPCID) 20 MG tablet Take 20 mg by mouth at bedtime.   Yes [provider]  glipiZIDE (GLUCOTROL) 5 MG tablet Take 2.5 mg by mouth 2 (two) times daily before a meal.   Yes [provider]  LACTOBACILLUS PROBIOTIC PO Take 2 tablets by mouth in the morning and at bedtime.   Yes [provider]  losartan (COZAAR) 100 MG tablet Take 100 mg by mouth in the morning.   Yes [provider]  memantine (NAMENDA) 5 MG tablet Take 5 mg by mouth 2 (two) times daily.   Yes [provider]  metFORMIN (GLUCOPHAGE) 1000 MG tablet Take 1,000 mg by mouth daily.   Yes [provider]  Multiple Vitamin (MULTIVITAMIN WITH MINERALS) TABS tablet Take 1 tablet by mouth daily.   Yes [provider]  nitroGLYCERIN (NITROSTAT) 0.4 MG SL tablet Place 0.4 mg under the tongue every 5 (five) minutes as needed for chest pain.   Yes [provider]  Omega-3 Fatty Acids (FISH OIL) 1000 MG CAPS Take 1 capsule by mouth daily.   Yes [provider]  pravastatin (PRAVACHOL) 40 MG tablet Take 40 mg by mouth in the morning.   Yes [provider]  tamsulosin (FLOMAX) 0.4 MG CAPS capsule Take 0.4 mg by mouth  every evening.   Yes [provider]  vitamin B-12 (CYANOCOBALAMIN) 500 MCG tablet Take 500 mcg by mouth daily.   Yes [provider]  acetaminophen (TYLENOL) 500 MG tablet Take 2 tablets (1,000 mg total) by mouth every 6 (six) hours as needed. Patient not taking: Reported on 11/02/2022 07/30/14   Arby Barrette, MD  cephALEXin (KEFLEX) 500 MG capsule Take 1 capsule (500 mg total) by mouth 4 (four) times daily. Patient not taking: Reported on 11/02/2022 10/07/22   Small, Brooke L, PA  losartan (COZAAR) 25 MG tablet Take 1 tablet (25 mg total) by mouth daily. Patient not taking: Reported on 11/02/2022 08/10/20   Marguerita Merles Latif, DO  ondansetron (ZOFRAN) 4 MG tablet Take 1  tablet (4 mg total) by mouth every 6 (six) hours as needed for nausea. Patient not taking: Reported on 11/02/2022 08/10/20   Marguerita Merles Latif, DO  psyllium (HYDROCIL/METAMUCIL) 95 % PACK Take 1 packet by mouth daily. Patient not taking: Reported on 11/02/2022 09/21/20   Rachael Fee, MD    Physical Exam: Vitals:   11/02/22 2024 11/02/22 2026 11/02/22 2028  BP:  (!) 162/68   Pulse:  95   Resp:  18   Temp:  (!) 101.5 F (38.6 C)   TempSrc:  Oral   SpO2: 97% 100%   Weight:   90.3 kg  Height:   5\' 9"  (1.753 m)   *** Data Reviewed: {Tip this will not be part of the note when signed- Document your independent interpretation of telemetry tracing, EKG, lab, Radiology test or any other diagnostic tests. Add any new diagnostic test ordered today. (Optional):26781} {Results:26384}  Assessment and Plan: No notes have been filed under this hospital service. Service: Hospitalist     Advance Care Planning:   Code Status: Full Code ***  Consults: ***  Family Communication: ***  Severity of Illness: {Observation/Inpatient:21159}  AuthorLonia Blood, MD 11/02/2022 11:38 PM  For on call review [email or web address].

## 2022-11-02 NOTE — ED Notes (Signed)
ED TO INPATIENT HANDOFF REPORT  ED Nurse Name and Phone #: Jodie Echevaria 58  S Name/Age/Gender [name] 77 y.o. male Room/Bed: 025C/025C  Code Status   Code Status: Prior  Home/SNF/Other Home Patient oriented to: self, place, time, and situation Is this baseline? Yes   Triage Complete: Triage complete  Chief Complaint Sepsis [facility]) [A41.9]  Triage Note Patient coming to ED for evaluation of weakness and confusion per wife.  Patient has no current complaints.  States "I woke up this morning and both my legs were weak.  My arms were hurting too."  Alert and oriented on arrival.  No reports of pain currently.    Allergies Allergies  Allergen Reactions   Contrast Media [Iodinated Contrast Media] Nausea And Vomiting   Quinine Derivatives Other (See Comments)    Pt does not know     Level of Care/Admitting Diagnosis ED Disposition     ED Disposition  Admit   Condition  --   Comment  [name] Area: [name] [[id number]]  Level of Care: Telemetry Medical [[id number]]  May admit patient to [facility] or [facility] if equivalent level of care is available:: Yes  Covid Evaluation: Asymptomatic - no recent exposure (last 10 days) testing not required  Diagnosis: Sepsis [name] [[id number]]  Admitting Physician: Rometta Emery [[id number]]  Attending Physician: Rometta Emery [[id number]]  Certification:: I certify this patient will need inpatient services for at least 2 midnights  Estimated Length of Stay: 4          B Medical/Surgery History Past Medical History:  Diagnosis Date   Anginal pain (HCC) 07-31-13   coronary stent x1 last 12'14   Arthritis    osteoarthritis   Bursitis 07-31-13   heel   Cancer (HCC) 07-31-13   skin cancer left cheek   Cataract 2014   bilateral cataract extraction   Chronic kidney disease 07-31-13    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 PAC's    GERD (gastroesophageal reflux disease)    Heart murmur    History of kidney stones 07-31-13   x 1   Hyperlipidemia    Hypertension    Peripheral vascular disease (HCC)    Plantar fasciitis of left foot 07-31-13   left leg   PONV (postoperative nausea and vomiting)    Rheumatic fever    when 77 years old   Past Surgical History:  Procedure Laterality Date   BIOPSY  08/10/2020   Procedure: BIOPSY;  Surgeon: Benancio Deeds, MD;  Location: [facility];  Service: Gastroenterology;;   CARDIAC CATHETERIZATION     x1 stents 12'14.   CIRCUMCISION     COLONOSCOPY WITH PROPOFOL N/A 08/10/2020   Procedure: COLONOSCOPY WITH PROPOFOL;  Surgeon: Benancio Deeds, MD;  Location: [facility];  Service: Gastroenterology;  Laterality: N/A;   ESOPHAGOGASTRODUODENOSCOPY (EGD) WITH PROPOFOL N/A 08/10/2020   Procedure: ESOPHAGOGASTRODUODENOSCOPY (EGD) WITH PROPOFOL;  Surgeon: Benancio Deeds, MD;  Location: [facility];  Service: Gastroenterology;  Laterality: N/A;   FEMORAL-PERONEAL BYPASS GRAFT Right    HEMOSTASIS CLIP PLACEMENT  08/10/2020   Procedure: HEMOSTASIS CLIP PLACEMENT;  Surgeon: Benancio Deeds, MD;  Location: [facility];  Service: Gastroenterology;;   HOT HEMOSTASIS N/A 08/10/2020   Procedure: HOT HEMOSTASIS (ARGON PLASMA COAGULATION/BICAP);  Surgeon: Benancio Deeds, MD;  Location: [facility];  Service: Gastroenterology;  Laterality: N/A;   leg stent Right 07-31-13   right leg   LUMBAR LAMINECTOMY/DECOMPRESSION MICRODISCECTOMY N/A 08/05/2013   Procedure: MICRO LUMBAR DECOMPRESSION L4 - L5;  Surgeon: Javier Docker, MD;  Location: [facility];  Service: Orthopedics;  Laterality: N/A;   LUMBAR LAMINECTOMY/DECOMPRESSION MICRODISCECTOMY N/A 07/22/2015   Procedure: REDO DECOMPRESSION LEVEL 2 L3-4 L4-5;  Surgeon: Jene Every, MD;  Location: [facility];  Service: Orthopedics;   Laterality: N/A;   RENAL ARTERY STENT Right 07-31-13   SHOULDER ARTHROSCOPY Left    RCTR   stent in Right kidney     VASECTOMY       A IV Location/Drains/Wounds Patient Lines/Drains/Airways Status     Active Line/Drains/Airways     Name Placement date Placement time Site Days   Peripheral IV 11/02/22 20 G Anterior;Distal;Left;Upper Arm 11/02/22  0000  Arm  less than 1            Intake/Output Last 24 hours No intake or output data in the 24 hours ending 11/02/22 2314  Labs/Imaging Results for orders placed or performed during the [name] encounter of 11/02/22 (from the past 48 hour(s))  Basic metabolic panel     Status: Abnormal   Collection Time: 11/02/22  8:34 PM  Result Value Ref Range   Sodium 136 135 - 145 mmol/L   Potassium 4.5 3.5 - 5.1 mmol/L   Chloride 100 98 - 111 mmol/L   CO2 23 22 - 32 mmol/L   Glucose, Bld 232 (H) 70 - 99 mg/dL    Comment: Glucose reference range applies only to samples taken after fasting for at least 8 hours.   BUN 24 (H) 8 - 23 mg/dL   Creatinine, Ser 9.60 (H) 0.61 - 1.24 mg/dL   Calcium 9.5 8.9 - 45.4 mg/dL   GFR, Estimated 47 (L) >60 mL/min    Comment: (NOTE) Calculated using the CKD-EPI Creatinine Equation (2021)    Anion gap 13 5 - 15    Comment: Performed at [facility], [address], [place], [place] [address]  CBC     Status: Abnormal   Collection Time: 11/02/22  8:34 PM  Result Value Ref Range   WBC 16.7 (H) 4.0 - 10.5 K/uL   RBC 3.68 (L) 4.22 - 5.81 MIL/uL   Hemoglobin 11.2 (L) 13.0 - 17.0 g/dL   HCT 91.4 (L) 78.2 - 95.6 %   MCV 90.5 80.0 - 100.0 fL   MCH 30.4 26.0 - 34.0 pg   MCHC 33.6 30.0 - 36.0 g/dL   RDW 21.3 08.6 - 57.8 %   Platelets 261 150 - 400 K/uL   nRBC 0.0 0.0 - 0.2 %    Comment: Performed at [facility], [address], [place], [place] [address]  CBG monitoring, ED     Status: Abnormal   Collection Time: 11/02/22  8:34 PM  Result Value Ref Range   Glucose-Capillary 243 (H) 70 - 99  mg/dL    Comment: Glucose reference range applies only to samples taken after fasting for at least 8 hours.  Urinalysis, Routine w reflex microscopic -Urine, Clean Catch     Status: Abnormal   Collection Time: 11/02/22  9:31 PM  Result Value Ref Range   Color, Urine YELLOW YELLOW   APPearance HAZY (A) CLEAR   Specific Gravity, Urine 1.017 1.005 - 1.030   pH 7.0 5.0 - 8.0   Glucose, UA  50 (A) NEGATIVE mg/dL   Hgb urine dipstick NEGATIVE NEGATIVE   Bilirubin Urine NEGATIVE NEGATIVE   Ketones, ur NEGATIVE NEGATIVE mg/dL   Protein, ur 782 (A) NEGATIVE mg/dL   Nitrite NEGATIVE NEGATIVE   Leukocytes,Ua SMALL (A) NEGATIVE   RBC / HPF 6-10 0 - 5 RBC/hpf   WBC, UA >50 0 - 5 WBC/hpf   Bacteria, UA RARE (A) NONE SEEN   Squamous Epithelial / HPF 0-5 0 - 5 /HPF   Mucus PRESENT     Comment: Performed at [name], [address], [place], [place] [address]  Lactic acid, plasma     Status: Abnormal   Collection Time: 11/02/22  9:39 PM  Result Value Ref Range   Lactic Acid, Venous 3.4 (HH) 0.5 - 1.9 mmol/L    Comment: CRITICAL RESULT CALLED TO, READ BACK BY AND VERIFIED WITH Anner Crete RN 11/02/22 2240 [place] Performed at [name], [address], [place], [place] [address]   Protime-INR     Status: None   Collection Time: 11/02/22  9:39 PM  Result Value Ref Range   Prothrombin Time 14.8 11.4 - 15.2 seconds   INR 1.1 0.8 - 1.2    Comment: (NOTE) INR goal varies based on device and disease states. Performed at [name], [address], [place], [place] [address]   APTT     Status: None   Collection Time: 11/02/22  9:39 PM  Result Value Ref Range   aPTT 31 24 - 36 seconds    Comment: Performed at [name], [address], [place], [place] [address]  Hepatic function panel     Status: Abnormal   Collection Time: 11/02/22  9:39 PM  Result Value Ref Range   Total Protein 7.1 6.5 - 8.1 g/dL   Albumin 3.7 3.5 - 5.0 g/dL   AST 25 15 - 41 U/L   ALT  48 (H) 0 - 44 U/L   Alkaline Phosphatase 76 38 - 126 U/L   Total Bilirubin 0.7 0.3 - 1.2 mg/dL   Bilirubin, Direct 0.1 0.0 - 0.2 mg/dL   Indirect Bilirubin 0.6 0.3 - 0.9 mg/dL    Comment: Performed at [facility], [address], [place], [place] [address]   DG Chest Port 1 View  Result Date: 11/02/2022 CLINICAL DATA:  Question of sepsis to evaluate for abnormality. Weakness in the arms and legs. EXAM: PORTABLE CHEST 1 VIEW COMPARISON:  10/07/2022 FINDINGS: Shallow inspiration. Heart size and pulmonary vascularity are normal for technique. Lungs are clear. No pleural effusions. No pneumothorax. Mediastinal contours appear intact. Calcification of the aorta. IMPRESSION: Shallow inspiration.  No evidence of active pulmonary disease. Electronically Signed   By: Burman Nieves M.D.   On: 11/02/2022 21:58    Pending Labs Unresulted Labs (From admission, onward)     Start     Ordered   11/02/22 2246  Urine Culture  Once,   R       Question:  Indication  Answer:  Dysuria   11/02/22 2245   11/02/22 2044  SARS Coronavirus 2 by RT PCR ([name] order, performed in [name] lab) *cepheid single result test* Anterior Nasal Swab  (Tier 2 - SARS Coronavirus 2 by RT PCR ([name] order, performed in [name]) *cepheid single result test*)  Once,   URGENT        11/02/22 2043   11/02/22 2043  Lactic acid, plasma  (  Undifferentiated presentation (screening labs and basic nursing orders))  Now then every 2 hours,   R (with STAT occurrences)      11/02/22 2043   11/02/22 2043  Blood Culture (routine x 2)  (Undifferentiated presentation (screening labs and basic nursing orders))  BLOOD CULTURE X 2,   STAT      11/02/22 2043            Vitals/Pain Today's Vitals   11/02/22 2024 11/02/22 2026 11/02/22 2028  BP:  (!) 162/68   Pulse:  95   Resp:  18   Temp:  (!) 101.5 F (38.6 C)   TempSrc:  Oral   SpO2: 97% 100%   Weight:   90.3 kg  Height:   5\' 9"  (1.753 m)   PainSc:   0-No pain    Isolation Precautions No active isolations  Medications Medications  cefTRIAXone (ROCEPHIN) 2 g in sodium chloride 0.9 % 100 mL IVPB (has no administration in time range)  sodium chloride 0.9 % bolus 500 mL (has no administration in time range)  sodium chloride 0.9 % bolus 1,000 mL (has no administration in time range)  ondansetron (ZOFRAN) injection 4 mg (4 mg Intravenous Given 11/02/22 2132)  sodium chloride 0.9 % bolus 500 mL (500 mLs Intravenous New Bag/Given 11/02/22 2132)    Mobility walks     Focused Assessments Cardiac Assessment Handoff:    No results found for: "CKTOTAL", "CKMB", "CKMBINDEX", "TROPONINI" No results found for: "DDIMER" Does the Patient currently have chest pain? No   , Neuro Assessment Handoff:  Swallow screen pass?  N/a         Neuro Assessment:   Neuro Checks:      Has TPA been given? No If patient is a Neuro Trauma and patient is going to OR before floor call report to 4N Charge nurse: [phone] or [phone]  , Pulmonary Assessment Handoff:  Lung sounds:   O2 Device: Room Air      R Recommendations: See Admitting Provider Note  Report given to:   Additional Notes: n/a

## 2022-11-03 ENCOUNTER — Encounter (HOSPITAL_COMMUNITY): Payer: Self-pay | Admitting: Internal Medicine

## 2022-11-03 DIAGNOSIS — A419 Sepsis, unspecified organism: Secondary | ICD-10-CM | POA: Diagnosis not present

## 2022-11-03 DIAGNOSIS — N390 Urinary tract infection, site not specified: Secondary | ICD-10-CM | POA: Diagnosis not present

## 2022-11-03 LAB — GLUCOSE, CAPILLARY
Glucose-Capillary: 229 mg/dL — ABNORMAL HIGH (ref 70–99)
Glucose-Capillary: 252 mg/dL — ABNORMAL HIGH (ref 70–99)
Glucose-Capillary: 208 mg/dL — ABNORMAL HIGH (ref 70–99)
Glucose-Capillary: 181 mg/dL — ABNORMAL HIGH (ref 70–99)

## 2022-11-03 LAB — CBC
HCT: 33 % — ABNORMAL LOW (ref 39.0–52.0)
Hemoglobin: 11 g/dL — ABNORMAL LOW (ref 13.0–17.0)
MCH: 29.7 pg (ref 26.0–34.0)
MCHC: 33.3 g/dL (ref 30.0–36.0)
MCV: 89.2 fL (ref 80.0–100.0)
Platelets: 235 10*3/uL (ref 150–400)
RBC: 3.7 MIL/uL — ABNORMAL LOW (ref 4.22–5.81)
RDW: 13.6 % (ref 11.5–15.5)
WBC: 16.8 10*3/uL — ABNORMAL HIGH (ref 4.0–10.5)
nRBC: 0 % (ref 0.0–0.2)

## 2022-11-03 LAB — COMPREHENSIVE METABOLIC PANEL
ALT: 49 U/L — ABNORMAL HIGH (ref 0–44)
AST: 34 U/L (ref 15–41)
Albumin: 3.4 g/dL — ABNORMAL LOW (ref 3.5–5.0)
Alkaline Phosphatase: 70 U/L (ref 38–126)
Anion gap: 16 — ABNORMAL HIGH (ref 5–15)
BUN: 21 mg/dL (ref 8–23)
CO2: 22 mmol/L (ref 22–32)
Calcium: 8.9 mg/dL (ref 8.9–10.3)
Chloride: 98 mmol/L (ref 98–111)
Creatinine, Ser: 1.4 mg/dL — ABNORMAL HIGH (ref 0.61–1.24)
GFR, Estimated: 51 mL/min — ABNORMAL LOW (ref >60)
Glucose, Bld: 231 mg/dL — ABNORMAL HIGH (ref 70–99)
Potassium: 3.9 mmol/L (ref 3.5–5.1)
Sodium: 136 mmol/L (ref 135–145)
Total Bilirubin: 0.6 mg/dL (ref 0.3–1.2)
Total Protein: 6.7 g/dL (ref 6.5–8.1)

## 2022-11-03 LAB — CULTURE, BLOOD (ROUTINE X 2)

## 2022-11-03 LAB — LACTIC ACID, PLASMA: Lactic Acid, Venous: 2.2 mmol/L (ref 0.5–1.9)

## 2022-11-03 LAB — HEMOGLOBIN A1C
Hgb A1c MFr Bld: 7.6 % — ABNORMAL HIGH (ref 4.8–5.6)
Mean Plasma Glucose: 171.42 mg/dL

## 2022-11-03 LAB — PROTIME-INR
INR: 1.2 (ref 0.8–1.2)
Prothrombin Time: 15.6 seconds — ABNORMAL HIGH (ref 11.4–15.2)

## 2022-11-03 LAB — CORTISOL-AM, BLOOD: Cortisol - AM: 29.3 ug/dL — ABNORMAL HIGH (ref 6.7–22.6)

## 2022-11-03 LAB — PROCALCITONIN: Procalcitonin: 0.14 ng/mL

## 2022-11-03 MED ORDER — ACETAMINOPHEN 325 MG PO TABS
650.0000 mg | ORAL_TABLET | Freq: Four times a day (QID) | ORAL | Status: DC | PRN
  Administered 2022-11-03 – 2022-11-12 (×11): 650 mg via ORAL
  Filled 2022-11-03 (×11): qty 2

## 2022-11-03 MED ORDER — VITAMIN B-12 1000 MCG PO TABS
500.0000 ug | ORAL_TABLET | Freq: Every day | ORAL | Status: DC
  Administered 2022-11-03 – 2022-11-12 (×10): 500 ug via ORAL
  Filled 2022-11-03 (×10): qty 1

## 2022-11-03 MED ORDER — ONDANSETRON HCL 4 MG/2ML IJ SOLN
4.0000 mg | Freq: Four times a day (QID) | INTRAMUSCULAR | Status: DC | PRN
  Administered 2022-11-05: 4 mg via INTRAVENOUS
  Filled 2022-11-03: qty 2

## 2022-11-03 MED ORDER — CARVEDILOL 6.25 MG PO TABS
6.2500 mg | ORAL_TABLET | Freq: Two times a day (BID) | ORAL | Status: DC
  Administered 2022-11-03 – 2022-11-12 (×19): 6.25 mg via ORAL
  Filled 2022-11-03 (×19): qty 1

## 2022-11-03 MED ORDER — ENOXAPARIN SODIUM 40 MG/0.4ML IJ SOSY
40.0000 mg | PREFILLED_SYRINGE | Freq: Every day | INTRAMUSCULAR | Status: DC
  Administered 2022-11-03 – 2022-11-12 (×10): 40 mg via SUBCUTANEOUS
  Filled 2022-11-03 (×10): qty 0.4

## 2022-11-03 MED ORDER — INSULIN ASPART 100 UNIT/ML IJ SOLN
0.0000 [IU] | Freq: Every day | INTRAMUSCULAR | Status: DC
  Administered 2022-11-03 – 2022-11-09 (×3): 2 [IU] via SUBCUTANEOUS

## 2022-11-03 MED ORDER — AMLODIPINE BESYLATE 10 MG PO TABS
10.0000 mg | ORAL_TABLET | Freq: Every day | ORAL | Status: DC
  Administered 2022-11-03 – 2022-11-12 (×10): 10 mg via ORAL
  Filled 2022-11-03 (×10): qty 1

## 2022-11-03 MED ORDER — CLOPIDOGREL BISULFATE 75 MG PO TABS
75.0000 mg | ORAL_TABLET | Freq: Every day | ORAL | Status: DC
  Administered 2022-11-03 – 2022-11-12 (×10): 75 mg via ORAL
  Filled 2022-11-03 (×10): qty 1

## 2022-11-03 MED ORDER — SODIUM CHLORIDE 0.9 % IV SOLN
2.0000 g | INTRAVENOUS | Status: AC
  Administered 2022-11-03 – 2022-11-08 (×6): 2 g via INTRAVENOUS
  Filled 2022-11-03 (×6): qty 20

## 2022-11-03 MED ORDER — DONEPEZIL HCL 10 MG PO TABS
10.0000 mg | ORAL_TABLET | Freq: Every morning | ORAL | Status: DC
  Administered 2022-11-03 – 2022-11-12 (×10): 10 mg via ORAL
  Filled 2022-11-03 (×10): qty 1

## 2022-11-03 MED ORDER — ACETAMINOPHEN 500 MG PO TABS
500.0000 mg | ORAL_TABLET | Freq: Once | ORAL | Status: AC
  Administered 2022-11-03: 500 mg via ORAL
  Filled 2022-11-03: qty 1

## 2022-11-03 MED ORDER — RINGERS IV SOLN: INTRAVENOUS | Status: DC

## 2022-11-03 MED ORDER — SODIUM CHLORIDE 0.9 % IV SOLN: 2.0000 g | INTRAVENOUS | Status: DC

## 2022-11-03 MED ORDER — MEMANTINE HCL 10 MG PO TABS
5.0000 mg | ORAL_TABLET | Freq: Two times a day (BID) | ORAL | Status: DC
  Administered 2022-11-03 – 2022-11-12 (×19): 5 mg via ORAL
  Filled 2022-11-03 (×19): qty 1

## 2022-11-03 MED ORDER — TAMSULOSIN HCL 0.4 MG PO CAPS
0.4000 mg | ORAL_CAPSULE | Freq: Every evening | ORAL | Status: DC
  Administered 2022-11-03 – 2022-11-11 (×9): 0.4 mg via ORAL
  Filled 2022-11-03 (×9): qty 1

## 2022-11-03 MED ORDER — LACTATED RINGERS IV SOLN
150.0000 mL/h | INTRAVENOUS | Status: DC
  Administered 2022-11-03: 150 mL/h via INTRAVENOUS

## 2022-11-03 MED ORDER — FAMOTIDINE 20 MG PO TABS
20.0000 mg | ORAL_TABLET | Freq: Every day | ORAL | Status: DC
  Administered 2022-11-03 – 2022-11-11 (×9): 20 mg via ORAL
  Filled 2022-11-03 (×9): qty 1

## 2022-11-03 MED ORDER — NITROGLYCERIN 0.4 MG SL SUBL: 0.4000 mg | SUBLINGUAL_TABLET | SUBLINGUAL | Status: DC | PRN

## 2022-11-03 MED ORDER — LINAGLIPTIN 5 MG PO TABS
5.0000 mg | ORAL_TABLET | Freq: Every evening | ORAL | Status: DC
  Administered 2022-11-03 – 2022-11-11 (×9): 5 mg via ORAL
  Filled 2022-11-03 (×11): qty 1

## 2022-11-03 MED ORDER — INSULIN ASPART 100 UNIT/ML IJ SOLN
0.0000 [IU] | Freq: Three times a day (TID) | INTRAMUSCULAR | Status: DC
  Administered 2022-11-03: 7 [IU] via SUBCUTANEOUS
  Administered 2022-11-03: 11 [IU] via SUBCUTANEOUS
  Administered 2022-11-04: 4 [IU] via SUBCUTANEOUS
  Administered 2022-11-04: 3 [IU] via SUBCUTANEOUS
  Administered 2022-11-04: 7 [IU] via SUBCUTANEOUS
  Administered 2022-11-05: 11 [IU] via SUBCUTANEOUS
  Administered 2022-11-05 – 2022-11-06 (×3): 4 [IU] via SUBCUTANEOUS
  Administered 2022-11-06: 7 [IU] via SUBCUTANEOUS
  Administered 2022-11-06 – 2022-11-07 (×2): 4 [IU] via SUBCUTANEOUS
  Administered 2022-11-07: 11 [IU] via SUBCUTANEOUS
  Administered 2022-11-07 – 2022-11-08 (×3): 4 [IU] via SUBCUTANEOUS
  Administered 2022-11-08 – 2022-11-09 (×3): 7 [IU] via SUBCUTANEOUS
  Administered 2022-11-09 – 2022-11-10 (×3): 4 [IU] via SUBCUTANEOUS
  Administered 2022-11-10: 7 [IU] via SUBCUTANEOUS
  Administered 2022-11-11: 3 [IU] via SUBCUTANEOUS
  Administered 2022-11-11 (×2): 4 [IU] via SUBCUTANEOUS
  Administered 2022-11-12: 11 [IU] via SUBCUTANEOUS
  Administered 2022-11-12: 4 [IU] via SUBCUTANEOUS

## 2022-11-03 MED ORDER — DULOXETINE HCL 20 MG PO CPEP
20.0000 mg | ORAL_CAPSULE | Freq: Every evening | ORAL | Status: DC
  Administered 2022-11-03 – 2022-11-11 (×9): 20 mg via ORAL
  Filled 2022-11-03 (×9): qty 1

## 2022-11-03 MED ORDER — PRAVASTATIN SODIUM 40 MG PO TABS
40.0000 mg | ORAL_TABLET | Freq: Every day | ORAL | Status: DC
  Administered 2022-11-03 – 2022-11-12 (×10): 40 mg via ORAL
  Filled 2022-11-03 (×10): qty 1

## 2022-11-03 MED ORDER — LACTATED RINGERS IV BOLUS
500.0000 mL | Freq: Once | INTRAVENOUS | Status: AC
  Administered 2022-11-03: 500 mL via INTRAVENOUS

## 2022-11-03 MED ORDER — ACETAMINOPHEN 500 MG PO TABS
1000.0000 mg | ORAL_TABLET | Freq: Once | ORAL | Status: AC
  Administered 2022-11-03: 1000 mg via ORAL
  Filled 2022-11-03: qty 2

## 2022-11-03 MED ORDER — LOSARTAN POTASSIUM 50 MG PO TABS
100.0000 mg | ORAL_TABLET | Freq: Every day | ORAL | Status: DC
  Administered 2022-11-03 – 2022-11-12 (×10): 100 mg via ORAL
  Filled 2022-11-03 (×11): qty 2

## 2022-11-03 MED ORDER — ONDANSETRON HCL 4 MG PO TABS: 4.0000 mg | ORAL_TABLET | Freq: Four times a day (QID) | ORAL | Status: DC | PRN

## 2022-11-03 MED ORDER — LACTATED RINGERS IV SOLN: INTRAVENOUS | Status: AC

## 2022-11-03 MED ORDER — LORATADINE 10 MG PO TABS
10.0000 mg | ORAL_TABLET | Freq: Every day | ORAL | Status: DC
  Administered 2022-11-03 – 2022-11-12 (×10): 10 mg via ORAL
  Filled 2022-11-03 (×10): qty 1

## 2022-11-03 NOTE — Progress Notes (Signed)
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  Patient wake up the morning of admission feeling his legs weak.  He also reported arm pains.  Evaluation in the ED patient was noted to be febrile with a temperature of 101, leukocytosis white blood cell 16, urinalysis consistent with UTI.   Assessment & Plan:   Principal Problem:   Sepsis secondary to UTI [facility]) Active Problems:   CAD (coronary artery disease)   Type 2 diabetes mellitus (HCC)   Status post carotid endarterectomy   AKI (acute kidney injury) (HCC)   Hyperlipidemia   BPH (benign prostatic hyperplasia)   Dementia without behavioral disturbance (HCC)  1-Sepsis secondary to urinary tract infection -Presented with weakness, fever, leukocytosis, fever, temperature 101, tachycardia heart rate 116, source of infection; UTI, UA with more than 50 WBC.  -Continue with IV fluids, IV ceftriaxone. -Follow urine culture. -Blood culture: no growth to date.    Diabetes type 2: Continue with Tradjenta.  SSI.   CKD stage IIIa; AKI ruled out.  Presents with CR 1.5 Cr Range: 1.5 Monitor.   Hyperlipidemia: Continue with Pravachol.   Dementia without behavioral disturbance: Continue with Namenda, Aricept and Cymbalta.  History of BPH: Continue with Flomax  Peripheral artery disease: Continue with Pravastatin And Plavix  Hypertension: Continue with Norvasc, Coreg Cozaar  Estimated body mass index is 29.39 kg/m as calculated from the following:   Height as of this encounter: 5\' 9"  (1.753 m).   Weight as of this encounter: 90.3 kg.   DVT prophylaxis: SSI Code  Status: Full code Family Communication: Care discussed with patient  Disposition Plan:  Status is: Inpatient Remains inpatient appropriate because: management of Sepsis.     Consultants:  None  Procedures:  none  Antimicrobials:  IV ceftriaxone.   Subjective: He is alert, conversant, feels better. Less confuse.   Objective: Vitals:   11/02/22 2028 11/03/22 0000 11/03/22 0036 11/03/22 0643  BP:  (!) 178/71 106/67 (!) 150/77  Pulse:  97 92 (!) 106  Resp:  20 17 17   Temp:   98.7 F (37.1 C) 99.5 F (37.5 C)  TempSrc:      SpO2:  98% 94% 94%  Weight: 90.3 kg     Height: 5\' 9"  (1.753 m)      No intake or output data in the 24 hours ending 11/03/22 0730 Filed Weights   11/02/22 2028  Weight: 90.3 kg    Examination:  General exam: Appears calm and comfortable  Respiratory system: Clear to auscultation. Respiratory effort normal. Cardiovascular system: S1 & S2 heard, RRR. No JVD, murmurs, rubs, gallops or clicks. No pedal edema. Gastrointestinal system: Abdomen is nondistended, soft and nontender. No organomegaly or masses felt. Normal bowel sounds heard. Central nervous system: Alert and oriented. No focal neurological deficits. Extremities: Symmetric 5 x 5 power.   Data Reviewed: I have personally reviewed following labs and imaging studies  CBC: Recent Labs  Lab 11/02/22 2034  WBC 16.7*  HGB 11.2*  HCT 33.3*  MCV 90.5  PLT 261   Basic Metabolic Panel: Recent Labs  Lab 11/02/22 2034  NA 136  K 4.5  CL 100  CO2 23  GLUCOSE 232*  BUN 24*  CREATININE 1.51*  CALCIUM 9.5   GFR: Estimated Creatinine Clearance: 44 mL/min (A) (by C-G formula based on SCr of 1.51 mg/dL (H)). Liver Function Tests: Recent Labs  Lab 11/02/22 2139  AST 25  ALT 48*  ALKPHOS 76  BILITOT 0.7  PROT 7.1  ALBUMIN 3.7   No results for input(s): "LIPASE", "AMYLASE" in the last 168 hours. No results for input(s): "AMMONIA" in the last 168 hours. Coagulation  Profile: Recent Labs  Lab 11/02/22 2139  INR 1.1   Cardiac Enzymes: No results for input(s): "CKTOTAL", "CKMB", "CKMBINDEX", "TROPONINI" in the last 168 hours. BNP (last 3 results) No results for input(s): "PROBNP" in the last 8760 hours. HbA1C: No results for input(s): "HGBA1C" in the last 72 hours. CBG: Recent Labs  Lab 11/02/22 2034  GLUCAP 243*   Lipid Profile: No results for input(s): "CHOL", "HDL", "LDLCALC", "TRIG", "CHOLHDL", "LDLDIRECT" in the last 72 hours. Thyroid Function Tests: No results for input(s): "TSH", "T4TOTAL", "FREET4", "T3FREE", "THYROIDAB" in the last 72 hours. Anemia Panel: No results for input(s): "VITAMINB12", "FOLATE", "FERRITIN", "TIBC", "IRON", "RETICCTPCT" in the last 72 hours. Sepsis Labs: Recent Labs  Lab 11/02/22 2139 11/02/22 2322  LATICACIDVEN 3.4* 2.2*    Recent Results (from the past 240 hour(s))  SARS Coronavirus 2 by RT PCR (hospital order, performed in [facility] hospital lab) *cepheid single result test* Anterior Nasal Swab     Status: None   Collection Time: 11/02/22 10:51 PM   Specimen: Anterior Nasal Swab  Result Value Ref Range Status   SARS Coronavirus 2 by RT PCR NEGATIVE NEGATIVE Final    Comment: Performed at [facility], [address], [place], [place] [address]         Radiology Studies: DG Chest Port 1 View  Result Date: 11/02/2022 CLINICAL DATA:  Question of sepsis to evaluate for abnormality. Weakness in the arms and legs. EXAM: PORTABLE CHEST 1 VIEW COMPARISON:  10/07/2022 FINDINGS: Shallow inspiration. Heart size and pulmonary vascularity are normal for technique. Lungs are clear. No pleural effusions. No pneumothorax. Mediastinal contours appear intact. Calcification of the aorta. IMPRESSION: Shallow inspiration.  No evidence of active pulmonary disease. Electronically Signed   By: Burman Nieves M.D.   On: 11/02/2022 21:58        Scheduled Meds: Continuous Infusions:   LOS: 1 day    Time  spent: 35 minutes    Rohnan Bartleson A Kenzie Thoreson, MD [facility]   If 7PM-7AM, please contact night-coverage [email or web address]  11/03/2022, 7:30 AM

## 2022-11-04 ENCOUNTER — Inpatient Hospital Stay (HOSPITAL_COMMUNITY): Payer: No Typology Code available for payment source

## 2022-11-04 DIAGNOSIS — N390 Urinary tract infection, site not specified: Secondary | ICD-10-CM | POA: Diagnosis not present

## 2022-11-04 DIAGNOSIS — A419 Sepsis, unspecified organism: Secondary | ICD-10-CM | POA: Diagnosis not present

## 2022-11-04 LAB — URINE CULTURE: Culture: >=100000 — AB

## 2022-11-04 LAB — CBC
HCT: 29.5 % — ABNORMAL LOW (ref 39.0–52.0)
Hemoglobin: 9.7 g/dL — ABNORMAL LOW (ref 13.0–17.0)
MCH: 29.1 pg (ref 26.0–34.0)
MCHC: 32.9 g/dL (ref 30.0–36.0)
MCV: 88.6 fL (ref 80.0–100.0)
Platelets: 195 10*3/uL (ref 150–400)
RBC: 3.33 MIL/uL — ABNORMAL LOW (ref 4.22–5.81)
RDW: 13.7 % (ref 11.5–15.5)
WBC: 13.2 10*3/uL — ABNORMAL HIGH (ref 4.0–10.5)
nRBC: 0 % (ref 0.0–0.2)

## 2022-11-04 LAB — BASIC METABOLIC PANEL
Anion gap: 11 (ref 5–15)
BUN: 23 mg/dL (ref 8–23)
CO2: 23 mmol/L (ref 22–32)
Calcium: 8.4 mg/dL — ABNORMAL LOW (ref 8.9–10.3)
Chloride: 100 mmol/L (ref 98–111)
Creatinine, Ser: 1.58 mg/dL — ABNORMAL HIGH (ref 0.61–1.24)
GFR, Estimated: 44 mL/min — ABNORMAL LOW (ref >60)
Glucose, Bld: 202 mg/dL — ABNORMAL HIGH (ref 70–99)
Potassium: 4.1 mmol/L (ref 3.5–5.1)
Sodium: 134 mmol/L — ABNORMAL LOW (ref 135–145)

## 2022-11-04 LAB — TSH: TSH: 0.55 u[IU]/mL (ref 0.350–4.500)

## 2022-11-04 LAB — GLUCOSE, CAPILLARY
Glucose-Capillary: 187 mg/dL — ABNORMAL HIGH (ref 70–99)
Glucose-Capillary: 247 mg/dL — ABNORMAL HIGH (ref 70–99)
Glucose-Capillary: 139 mg/dL — ABNORMAL HIGH (ref 70–99)
Glucose-Capillary: 180 mg/dL — ABNORMAL HIGH (ref 70–99)

## 2022-11-04 LAB — VITAMIN B12: Vitamin B-12: 421 pg/mL (ref 180–914)

## 2022-11-04 MED ORDER — LACTATED RINGERS IV SOLN: INTRAVENOUS | Status: AC

## 2022-11-04 MED ORDER — SENNA 8.6 MG PO TABS
1.0000 | ORAL_TABLET | Freq: Every day | ORAL | Status: DC
  Filled 2022-11-04: qty 1

## 2022-11-04 MED ORDER — INSULIN ASPART 100 UNIT/ML IJ SOLN
2.0000 [IU] | Freq: Three times a day (TID) | INTRAMUSCULAR | Status: DC
  Administered 2022-11-04 – 2022-11-08 (×13): 2 [IU] via SUBCUTANEOUS

## 2022-11-04 NOTE — Progress Notes (Signed)
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  Patient wake up the morning of admission feeling his legs weak.  He also reported arm pains.  Evaluation in the ED patient was noted to be febrile with a temperature of 101, leukocytosis white blood cell 16, urinalysis consistent with UTI.  Patient admitted with sepsis secondary to urinary tract infection.  Assessment & Plan:   Principal Problem:   Sepsis secondary to UTI [facility]) Active Problems:   CAD (coronary artery disease)   Type 2 diabetes mellitus (HCC)   Status post carotid endarterectomy   AKI (acute kidney injury) (HCC)   Hyperlipidemia   BPH (benign prostatic hyperplasia)   Dementia without behavioral disturbance (HCC)  1-Sepsis secondary to urinary tract infection -Presented with weakness, fever, leukocytosis, fever, temperature 101, tachycardia heart rate 116, source of infection; UTI, UA with more than 50 WBC.  -Continue with IV fluids, IV ceftriaxone. -Follow urine culture.  Growing E. coli pansensitive -Blood culture: no growth to date.  -Will continue with another day of IV antibiotics, he was still having fevers yesterday -White blood cell trending down  Diabetes type 2: Continue with Tradjenta.  SSI.   CKD stage IIIa; AKI ruled out.  Presents with CR 1.5 Cr Range: 1.5 Renal function stable  Hyperlipidemia: Continue with Pravachol.   Dementia without behavioral disturbance: Continue with Namenda, Aricept and Cymbalta.  History of BPH: Continue with Flomax  Peripheral artery disease: Continue with Pravastatin And Plavix  Hypertension: Continue  with Norvasc, Coreg Cozaar  Estimated body mass index is 29.39 kg/m as calculated from the following:   Height as of this encounter: 5\' 9"  (1.753 m).   Weight as of this encounter: 90.3 kg.   DVT prophylaxis: SSI Code Status: Full code Family Communication: Care discussed with patient , wife over the phone on 6/15 Disposition Plan:  Status is: Inpatient Remains inpatient appropriate because: management of Sepsis.     Consultants:  None  Procedures:  none  Antimicrobials:  IV ceftriaxone.   Subjective: He is lying down in bed, denies pain, he is alert and oriented x 3 He denies pain, he reports better Objective: Vitals:   11/03/22 1844 11/03/22 2004 11/03/22 2054 11/04/22 0435  BP:   (!) 118/53 (!) 143/69  Pulse:   71 73  Resp:   18 18  Temp: (!) 102.4 F (39.1 C) 99.7 F (37.6 C)  98.3 F (36.8 C)  TempSrc:  Oral    SpO2:   93% 98%  Weight:      Height:        Intake/Output Summary (Last 24 hours) at 11/04/2022 1210 Last data filed at 11/04/2022 0001 Gross per 24 hour  Intake 461.22 ml  Output --  Net 461.22 ml   Filed Weights   11/02/22 2028  Weight: 90.3 kg    Examination:  General exam: NAD Respiratory system: CTA Cardiovascular system: S 1, S 2 RRR Gastrointestinal system:BS present, soft, nt Central nervous system: Alert, oriented Extremities: No edema   Data Reviewed: I have personally reviewed following labs and imaging studies  CBC: Recent Labs  Lab 11/02/22 2034 11/03/22 0814 11/04/22 0632  WBC 16.7* 16.8*  13.2*  HGB 11.2* 11.0* 9.7*  HCT 33.3* 33.0* 29.5*  MCV 90.5 89.2 88.6  PLT 261 235 195    Basic Metabolic Panel: Recent Labs  Lab 11/02/22 2034 11/03/22 0814 11/04/22 0632  NA 136 136 134*  K 4.5 3.9 4.1  CL 100 98 100  CO2 23 22 23   GLUCOSE 232* 231* 202*  BUN 24* 21 23  CREATININE 1.51* 1.40* 1.58*  CALCIUM 9.5 8.9 8.4*    GFR: Estimated Creatinine Clearance: 42.1 mL/min (A) (by C-G formula based on SCr of  1.58 mg/dL (H)). Liver Function Tests: Recent Labs  Lab 11/02/22 2139 11/03/22 0814  AST 25 34  ALT 48* 49*  ALKPHOS 76 70  BILITOT 0.7 0.6  PROT 7.1 6.7  ALBUMIN 3.7 3.4*    No results for input(s): "LIPASE", "AMYLASE" in the last 168 hours. No results for input(s): "AMMONIA" in the last 168 hours. Coagulation Profile: Recent Labs  Lab 11/02/22 2139 11/03/22 0814  INR 1.1 1.2    Cardiac Enzymes: No results for input(s): "CKTOTAL", "CKMB", "CKMBINDEX", "TROPONINI" in the last 168 hours. BNP (last 3 results) No results for input(s): "PROBNP" in the last 8760 hours. HbA1C: Recent Labs    11/03/22 0814  HGBA1C 7.6*   CBG: Recent Labs  Lab 11/03/22 1201 11/03/22 1724 11/03/22 2005 11/04/22 0817 11/04/22 1208  GLUCAP 252* 208* 181* 187* 247*    Lipid Profile: No results for input(s): "CHOL", "HDL", "LDLCALC", "TRIG", "CHOLHDL", "LDLDIRECT" in the last 72 hours. Thyroid Function Tests: Recent Labs    11/04/22 0406  TSH 0.550   Anemia Panel: Recent Labs    11/04/22 0406  VITAMINB12 421   Sepsis Labs: Recent Labs  Lab 11/02/22 2139 11/02/22 2322 11/03/22 0814  PROCALCITON  --   --  0.14  LATICACIDVEN 3.4* 2.2*  --      Recent Results (from the past 240 hour(s))  Blood Culture (routine x 2)     Status: None (Preliminary result)   Collection Time: 11/02/22  8:43 PM   Specimen: BLOOD  Result Value Ref Range Status   Specimen Description BLOOD RIGHT ANTECUBITAL  Final   Special Requests   Final    BOTTLES DRAWN AEROBIC AND ANAEROBIC Blood Culture results may not be optimal due to an excessive volume of blood received in culture bottles   Culture   Final    NO GROWTH 2 DAYS Performed at [facility] Lab, [address] [place], [place] [address]    Report Status PENDING  Incomplete  Urine Culture     Status: Abnormal   Collection Time: 11/02/22  9:31 PM   Specimen: Urine, Clean Catch  Result Value Ref Range Status   Specimen Description  URINE, CLEAN CATCH  Final   Special Requests   Final    NONE Performed at [facility], [address] [place], [place] [address]    Culture >=100,000 COLONIES/mL ESCHERICHIA COLI (A)  Final   Report Status 11/04/2022 FINAL  Final   Organism ID, Bacteria ESCHERICHIA COLI (A)  Final      Susceptibility   Escherichia coli - MIC*    AMPICILLIN <=2 SENSITIVE Sensitive     CEFAZOLIN <=4 SENSITIVE Sensitive     CEFEPIME <=0.12 SENSITIVE Sensitive     CEFTRIAXONE <=0.25 SENSITIVE Sensitive     CIPROFLOXACIN <=0.25 SENSITIVE Sensitive     GENTAMICIN <=1 SENSITIVE Sensitive     IMIPENEM <=0.25 SENSITIVE Sensitive     NITROFURANTOIN <=16 SENSITIVE  Sensitive     TRIMETH/SULFA <=20 SENSITIVE Sensitive     AMPICILLIN/SULBACTAM <=2 SENSITIVE Sensitive     PIP/TAZO <=4 SENSITIVE Sensitive     * >=100,000 COLONIES/mL ESCHERICHIA COLI  Blood Culture (routine x 2)     Status: None (Preliminary result)   Collection Time: 11/02/22  9:50 PM   Specimen: BLOOD LEFT HAND  Result Value Ref Range Status   Specimen Description BLOOD LEFT HAND  Final   Special Requests   Final    BOTTLES DRAWN AEROBIC AND ANAEROBIC Blood Culture results may not be optimal due to an excessive volume of blood received in culture bottles   Culture   Final    NO GROWTH 2 DAYS Performed at [facility], [address], [place], [place] [address]    Report Status PENDING  Incomplete  SARS Coronavirus 2 by RT PCR (hospital order, performed in [facility] hospital lab) *cepheid single result test* Anterior Nasal Swab     Status: None   Collection Time: 11/02/22 10:51 PM   Specimen: Anterior Nasal Swab  Result Value Ref Range Status   SARS Coronavirus 2 by RT PCR NEGATIVE NEGATIVE Final    Comment: Performed at [facility], [address], [place], [place] [address]         Radiology Studies: DG Chest Port 1 View  Result Date: 11/02/2022 CLINICAL DATA:  Question of sepsis to evaluate for abnormality.  Weakness in the arms and legs. EXAM: PORTABLE CHEST 1 VIEW COMPARISON:  10/07/2022 FINDINGS: Shallow inspiration. Heart size and pulmonary vascularity are normal for technique. Lungs are clear. No pleural effusions. No pneumothorax. Mediastinal contours appear intact. Calcification of the aorta. IMPRESSION: Shallow inspiration.  No evidence of active pulmonary disease. Electronically Signed   By: Burman Nieves M.D.   On: 11/02/2022 21:58        Scheduled Meds:  amLODipine  10 mg Oral Daily   carvedilol  6.25 mg Oral BID WC   clopidogrel  75 mg Oral Daily   cyanocobalamin  500 mcg Oral Daily   donepezil  10 mg Oral q AM   DULoxetine  20 mg Oral QPM   enoxaparin (LOVENOX) injection  40 mg Subcutaneous Daily   famotidine  20 mg Oral QHS   insulin aspart  0-20 Units Subcutaneous TID WC   insulin aspart  0-5 Units Subcutaneous QHS   linagliptin  5 mg Oral QPM   loratadine  10 mg Oral Daily   losartan  100 mg Oral Daily   memantine  5 mg Oral BID   pravastatin  40 mg Oral Daily   tamsulosin  0.4 mg Oral QPM   Continuous Infusions:  cefTRIAXone (ROCEPHIN)  IV 2 g (11/03/22 2159)     LOS: 2 days    Time spent: 35 minutes    Paulanthony Gleaves A Johnnay Pleitez, MD [facility]   If 7PM-7AM, please contact night-coverage [email or web address]  11/04/2022, 12:10 PM

## 2022-11-05 DIAGNOSIS — A419 Sepsis, unspecified organism: Secondary | ICD-10-CM | POA: Diagnosis not present

## 2022-11-05 DIAGNOSIS — N390 Urinary tract infection, site not specified: Secondary | ICD-10-CM | POA: Diagnosis not present

## 2022-11-05 LAB — BASIC METABOLIC PANEL
Anion gap: 14 (ref 5–15)
BUN: 30 mg/dL — ABNORMAL HIGH (ref 8–23)
CO2: 23 mmol/L (ref 22–32)
Calcium: 8.5 mg/dL — ABNORMAL LOW (ref 8.9–10.3)
Chloride: 99 mmol/L (ref 98–111)
Creatinine, Ser: 1.75 mg/dL — ABNORMAL HIGH (ref 0.61–1.24)
GFR, Estimated: 39 mL/min — ABNORMAL LOW (ref >60)
Glucose, Bld: 222 mg/dL — ABNORMAL HIGH (ref 70–99)
Potassium: 3.9 mmol/L (ref 3.5–5.1)
Sodium: 136 mmol/L (ref 135–145)

## 2022-11-05 LAB — CBC
HCT: 27.4 % — ABNORMAL LOW (ref 39.0–52.0)
Hemoglobin: 8.9 g/dL — ABNORMAL LOW (ref 13.0–17.0)
MCH: 29.1 pg (ref 26.0–34.0)
MCHC: 32.5 g/dL (ref 30.0–36.0)
MCV: 89.5 fL (ref 80.0–100.0)
Platelets: 185 10*3/uL (ref 150–400)
RBC: 3.06 MIL/uL — ABNORMAL LOW (ref 4.22–5.81)
RDW: 13.4 % (ref 11.5–15.5)
WBC: 9.7 10*3/uL (ref 4.0–10.5)
nRBC: 0 % (ref 0.0–0.2)

## 2022-11-05 LAB — GLUCOSE, CAPILLARY
Glucose-Capillary: 187 mg/dL — ABNORMAL HIGH (ref 70–99)
Glucose-Capillary: 269 mg/dL — ABNORMAL HIGH (ref 70–99)
Glucose-Capillary: 187 mg/dL — ABNORMAL HIGH (ref 70–99)
Glucose-Capillary: 177 mg/dL — ABNORMAL HIGH (ref 70–99)

## 2022-11-05 LAB — CULTURE, BLOOD (ROUTINE X 2)

## 2022-11-05 MED ORDER — INSULIN GLARGINE-YFGN 100 UNIT/ML ~~LOC~~ SOLN
10.0000 [IU] | Freq: Every day | SUBCUTANEOUS | Status: DC
  Administered 2022-11-05 – 2022-11-06 (×2): 10 [IU] via SUBCUTANEOUS
  Filled 2022-11-05 (×3): qty 0.1

## 2022-11-05 NOTE — Progress Notes (Signed)
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  Assessment & Plan:   Principal Problem:   Sepsis secondary to UTI [facility]) Active Problems:   CAD (coronary artery disease)   Type 2 diabetes mellitus (HCC)   Status post carotid endarterectomy   AKI (acute kidney injury) (HCC)   Hyperlipidemia   BPH (benign prostatic hyperplasia)   Dementia without behavioral disturbance (HCC)  Sepsis secondary to UTI POA: Met sepsis criteria based on fever, leukocytosis and tachycardia.  UA consistent with UTI, urine culture growing E. coli which is pansensitive.  Patient remains on Rocephin and has been afebrile for 36 hours now.  Generalized weakness: Secondary to sepsis.  Seen by PT OT and they recommend SNF.  Patient and wife in agreement.  TOC consulted.  CKD stage IIIa: At baseline.  Type 2 diabetes mellitus: Hemoglobin A1c 7.6.  Takes glipizide, Tradjenta and metformin at home.  Currently on Tradjenta and SSI.  Blood sugar slightly elevated.  Will add Semglee 10 units.    Hyperlipidemia: Continue Pravachol.  Dementia without behavioral disturbances: Continue Namenda, Aricept and Cymbalta.  History of BPH: Continue Flomax.  PAD: Continue pravastatin and Plavix.  Essential hypertension: Controlled.  Continue Norvasc, Coreg and Cozaar.  DVT prophylaxis: enoxaparin (LOVENOX) injection 40 mg Start: 11/03/22 1000   Code Status: Full Code  Family Communication:  None present at bedside.  Plan of care discussed with patient in length and he/she verbalized understanding and  agreed with it.  Also discussed with his wife over the phone.  Status is: Inpatient Remains inpatient appropriate because: Medically stable, pending placement to SNF.  TOC on board.   Estimated body mass index is 29.39 kg/m as calculated from the following:   Height as of this encounter: 5\' 9"  (1.753 m).   Weight as of this encounter: 90.3 kg.    Nutritional Assessment: Body mass index is 29.39 kg/m.Marland Kitchen Seen by dietician.  I agree with the assessment and plan as outlined below: Nutrition Status:        . Skin Assessment: I have examined the patient's skin and I agree with the wound assessment as performed by the wound care RN as outlined below:    Consultants:  None  Procedures:  None  Antimicrobials:  Anti-infectives (From admission, onward)    Start     Dose/Rate Route Frequency Ordered Stop   11/03/22 2200  cefTRIAXone (ROCEPHIN) 2 g in sodium chloride 0.9 % 100 mL IVPB  Status:  Discontinued        2 g 200 mL/hr over 30 Minutes Intravenous Every 24 hours 11/03/22 0737 11/03/22 1810   11/03/22 2200  cefTRIAXone (ROCEPHIN) 2 g in sodium chloride 0.9 % 100 mL IVPB        2 g 200 mL/hr over 30 Minutes Intravenous Every 24 hours 11/03/22 1810 11/10/22 2159   11/02/22 2230  cefTRIAXone (ROCEPHIN) 2 g in sodium chloride 0.9 % 100 mL IVPB        2 g 200 mL/hr over 30 Minutes Intravenous  Once 11/02/22 2224 11/03/22 0014  Subjective: Patient seen and examined.  He has no complaints.  Objective: Vitals:   11/04/22 1645 11/04/22 2004 11/05/22 0431 11/05/22 0822  BP: (!) 153/68 (!) 118/52 (!) 138/55 (!) 97/52  Pulse: 84 77 66 70  Resp: 17 18 18 18   Temp: 100.3 F (37.9 C) 98 F (36.7 C) 98.5 F (36.9 C) 98 F (36.7 C)  TempSrc: Oral     SpO2: 96% 96% 96% 97%  Weight:      Height:        Intake/Output Summary (Last 24 hours) at 11/05/2022 1241 Last data filed at 11/05/2022 1214 Gross per 24 hour  Intake --  Output 2150 ml  Net -2150 ml   Filed  Weights   11/02/22 2028  Weight: 90.3 kg    Examination:  General exam: Appears calm and comfortable  Respiratory system: Clear to auscultation. Respiratory effort normal. Cardiovascular system: S1 & S2 heard, RRR. No JVD, murmurs, rubs, gallops or clicks. No pedal edema. Gastrointestinal system: Abdomen is nondistended, soft and nontender. No organomegaly or masses felt. Normal bowel sounds heard. Central nervous system: Alert and oriented. No focal neurological deficits. Extremities: Symmetric 5 x 5 power. Skin: No rashes, lesions or ulcers Psychiatry: Judgement and insight appear normal. Mood & affect appropriate.    Data Reviewed: I have personally reviewed following labs and imaging studies  CBC: Recent Labs  Lab 11/02/22 2034 11/03/22 0814 11/04/22 0632 11/05/22 0420  WBC 16.7* 16.8* 13.2* 9.7  HGB 11.2* 11.0* 9.7* 8.9*  HCT 33.3* 33.0* 29.5* 27.4*  MCV 90.5 89.2 88.6 89.5  PLT 261 235 195 185   Basic Metabolic Panel: Recent Labs  Lab 11/02/22 2034 11/03/22 0814 11/04/22 0632 11/05/22 0420  NA 136 136 134* 136  K 4.5 3.9 4.1 3.9  CL 100 98 100 99  CO2 23 22 23 23   GLUCOSE 232* 231* 202* 222*  BUN 24* 21 23 30*  CREATININE 1.51* 1.40* 1.58* 1.75*  CALCIUM 9.5 8.9 8.4* 8.5*   GFR: Estimated Creatinine Clearance: 38 mL/min (A) (by C-G formula based on SCr of 1.75 mg/dL (H)). Liver Function Tests: Recent Labs  Lab 11/02/22 2139 11/03/22 0814  AST 25 34  ALT 48* 49*  ALKPHOS 76 70  BILITOT 0.7 0.6  PROT 7.1 6.7  ALBUMIN 3.7 3.4*   No results for input(s): "LIPASE", "AMYLASE" in the last 168 hours. No results for input(s): "AMMONIA" in the last 168 hours. Coagulation Profile: Recent Labs  Lab 11/02/22 2139 11/03/22 0814  INR 1.1 1.2   Cardiac Enzymes: No results for input(s): "CKTOTAL", "CKMB", "CKMBINDEX", "TROPONINI" in the last 168 hours. BNP (last 3 results) No results for input(s): "PROBNP" in the last 8760 hours. HbA1C: Recent Labs     11/03/22 0814  HGBA1C 7.6*   CBG: Recent Labs  Lab 11/04/22 1208 11/04/22 1653 11/04/22 2013 11/05/22 0821 11/05/22 1219  GLUCAP 247* 139* 180* 187* 269*   Lipid Profile: No results for input(s): "CHOL", "HDL", "LDLCALC", "TRIG", "CHOLHDL", "LDLDIRECT" in the last 72 hours. Thyroid Function Tests: Recent Labs    11/04/22 0406  TSH 0.550   Anemia Panel: Recent Labs    11/04/22 0406  VITAMINB12 421   Sepsis Labs: Recent Labs  Lab 11/02/22 2139 11/02/22 2322 11/03/22 0814  PROCALCITON  --   --  0.14  LATICACIDVEN 3.4* 2.2*  --     Recent Results (from the past 240 hour(s))  Blood Culture (routine x 2)     Status: None (Preliminary result)  Collection Time: 11/02/22  8:43 PM   Specimen: BLOOD  Result Value Ref Range Status   Specimen Description BLOOD RIGHT ANTECUBITAL  Final   Special Requests   Final    BOTTLES DRAWN AEROBIC AND ANAEROBIC Blood Culture results may not be optimal due to an excessive volume of blood received in culture bottles   Culture   Final    NO GROWTH 3 DAYS Performed at [facility], [address], [place], [place] [address]    Report Status PENDING  Incomplete  Urine Culture     Status: Abnormal   Collection Time: 11/02/22  9:31 PM   Specimen: Urine, Clean Catch  Result Value Ref Range Status   Specimen Description URINE, CLEAN CATCH  Final   Special Requests   Final    NONE Performed at [facility], [address], [place], [place] [address]    Culture >=100,000 COLONIES/mL ESCHERICHIA COLI (A)  Final   Report Status 11/04/2022 FINAL  Final   Organism ID, Bacteria ESCHERICHIA COLI (A)  Final      Susceptibility   Escherichia coli - MIC*    AMPICILLIN <=2 SENSITIVE Sensitive     CEFAZOLIN <=4 SENSITIVE Sensitive     CEFEPIME <=0.12 SENSITIVE Sensitive     CEFTRIAXONE <=0.25 SENSITIVE Sensitive     CIPROFLOXACIN <=0.25 SENSITIVE Sensitive     GENTAMICIN <=1 SENSITIVE Sensitive     IMIPENEM <=0.25 SENSITIVE  Sensitive     NITROFURANTOIN <=16 SENSITIVE Sensitive     TRIMETH/SULFA <=20 SENSITIVE Sensitive     AMPICILLIN/SULBACTAM <=2 SENSITIVE Sensitive     PIP/TAZO <=4 SENSITIVE Sensitive     * >=100,000 COLONIES/mL ESCHERICHIA COLI  Blood Culture (routine x 2)     Status: None (Preliminary result)   Collection Time: 11/02/22  9:50 PM   Specimen: BLOOD LEFT HAND  Result Value Ref Range Status   Specimen Description BLOOD LEFT HAND  Final   Special Requests   Final    BOTTLES DRAWN AEROBIC AND ANAEROBIC Blood Culture results may not be optimal due to an excessive volume of blood received in culture bottles   Culture   Final    NO GROWTH 3 DAYS Performed at [facility], [address] [place], [place] [address]    Report Status PENDING  Incomplete  SARS Coronavirus 2 by RT PCR (hospital order, performed in [facility] hospital lab) *cepheid single result test* Anterior Nasal Swab     Status: None   Collection Time: 11/02/22 10:51 PM   Specimen: Anterior Nasal Swab  Result Value Ref Range Status   SARS Coronavirus 2 by RT PCR NEGATIVE NEGATIVE Final    Comment: Performed at [facility], [address], [place], [place] [address]     Radiology Studies: CT HEAD WO CONTRAST ( )  Result Date: 11/04/2022 CLINICAL DATA:  77 year old male with altered mental status. Recent question of sepsis. EXAM: CT HEAD WITHOUT CONTRAST TECHNIQUE: Contiguous axial images were obtained from the base of the skull through the vertex without intravenous contrast. RADIATION DOSE REDUCTION: This exam was performed according to the departmental dose-optimization program which includes automated exposure control, adjustment of the mA and/or kV according to patient size and/or use of iterative reconstruction technique. COMPARISON:  Brain MRI 10/07/2022. FINDINGS: Brain: Questionable disproportionate atrophy of the frontal lobes, especially the right (coronal image 37), volume unchanged from MRI last month.  No superimposed No midline shift, ventriculomegaly, mass effect, evidence of mass  lesion, intracranial hemorrhage or evidence of cortically based acute infarction. Gray-white differentiation remains largely normal for age, mild nonspecific white matter changes. Vascular: No suspicious intracranial vascular hyperdensity. Calcified atherosclerosis at the skull base. Skull: Negative. Sinuses/Orbits: Visualized paranasal sinuses and mastoids are clear. Other: Negative orbit and scalp soft tissues, postoperative changes to both globes. IMPRESSION: 1. No acute intracranial abnormality. 2. 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 Electronically Signed   By: Odessa Fleming M.D.   On: 11/04/2022 12:29    Scheduled Meds:  amLODipine  10 mg Oral Daily   carvedilol  6.25 mg Oral BID WC   clopidogrel  75 mg Oral Daily   cyanocobalamin  500 mcg Oral Daily   donepezil  10 mg Oral q AM   DULoxetine  20 mg Oral QPM   enoxaparin (LOVENOX) injection  40 mg Subcutaneous Daily   famotidine  20 mg Oral QHS   insulin aspart  0-20 Units Subcutaneous TID WC   insulin aspart  0-5 Units Subcutaneous QHS   insulin aspart  2 Units Subcutaneous TID WC   linagliptin  5 mg Oral QPM   loratadine  10 mg Oral Daily   losartan  100 mg Oral Daily   memantine  5 mg Oral BID   pravastatin  40 mg Oral Daily   tamsulosin  0.4 mg Oral QPM   Continuous Infusions:  cefTRIAXone (ROCEPHIN)  IV 2 g (11/04/22 2204)   lactated ringers 50 mL/hr at 11/04/22 1230     LOS: 3 days   Hughie Closs, MD [facility]  11/05/2022, 12:41 PM   *Please note that this is a verbal dictation therefore any spelling or grammatical errors are due to the "[employer] One" system interpretation.  Please page via Amion and do not message via secure chat for urgent patient care matters. Secure chat can be used for non urgent patient care matters.  How to contact the [facility] Attending or  Consulting provider 7A - 7P or covering provider during after hours 7P -7A, for this patient?  Check the care team in [facility] and look for a) attending/consulting [facility] provider listed and b) the [facility] team listed. Page or secure chat 7A-7P. Log into [email or web address] and use 's universal password to access. If you do not have the password, please contact the hospital operator. Locate the [facility] provider you are looking for under [facility] and page to a number that you can be directly reached. If you still have difficulty reaching the provider, please page the [facility] (Director on Call) for the Hospitalists listed on amion for assistance.

## 2022-11-05 NOTE — TOC Initial Note (Signed)
Transition of Care [facility]) - Initial/Assessment Note    Patient Details  Name: [name] MRN: [id number] Date of Birth: Nov 27, 1945  Transition of Care [facility]) CM/SW Contact:    Janae Bridgeman, RN Phone Number: 11/05/2022, 12:47 PM  Clinical Narrative:                 CM met with the patient at the bedside to discuss TOC needs - patient lives at home with his wife and was admitted for Sepsis, UTI with frequent falls at home.  I spoke with the patient at the bedside - wife is not present at this time.  The patient was agreeable to SNF placement using his Medicare benefits.  [facility] and [facility] of patient's admission to the hospital since patient was unaware if [facility] was notified by his wife or not.  Kiva, MSW with [facility] Team will follow up with the patient's wife regarding SNF placement.  Expected Discharge Plan: Skilled Nursing Facility Barriers to Discharge: Continued Medical Work up   Patient Goals and CMS Choice Patient states their goals for this hospitalization and ongoing recovery are:: To get better CMS Medicare.gov Compare Post Acute Care list provided to:: Patient Choice offered to / list presented to : Patient [facility] ownership interest in [facility].provided to:: Patient    Expected Discharge Plan and Services   Discharge Planning Services: CM Consult Post Acute Care Choice: Skilled Nursing Facility Living arrangements for the past 2 months: Single Family Home                                      Prior Living Arrangements/Services Living arrangements for the past 2 months: Single Family Home Lives with:: Spouse Patient language and need for interpreter reviewed:: Yes Do you feel safe going back to the place where you live?: Yes      Need for Family Participation in Patient Care: Yes (Comment) Care giver support system in place?: Yes (comment) Current home services: DME (DME at the home includes RW, Cane) Criminal Activity/Legal  Involvement Pertinent to Current Situation/Hospitalization: No - Comment as needed  Activities of Daily Living Home Assistive Devices/Equipment: Other (Comment) ADL Screening (condition at time of admission) Patient's cognitive ability adequate to safely complete daily activities?: Yes Is the patient deaf or have difficulty hearing?: No Does the patient have difficulty seeing, even when wearing glasses/contacts?: No Does the patient have difficulty concentrating, remembering, or making decisions?: No Patient able to express need for assistance with ADLs?: Yes Does the patient have difficulty dressing or bathing?: No Independently performs ADLs?: Yes (appropriate for developmental age) Does the patient have difficulty walking or climbing stairs?: Yes Weakness of Legs: Both Weakness of Arms/Hands: None  Permission Sought/Granted Permission sought to share information with : Case Manager, Family Supports, Magazine features editor Permission granted to share information with : Yes, Verbal Permission Granted     Permission granted to share info w AGENCY: SNF facilities for STR placement  Permission granted to share info w Relationship: spouse - Olga Norsworthy - [phone]     Emotional Assessment Appearance:: Appears stated age Attitude/Demeanor/Rapport: Gracious Affect (typically observed): Accepting Orientation: : Oriented to Self, Oriented to Place, Oriented to  Time, Oriented to Situation Alcohol / Substance Use: Not Applicable Psych Involvement: No (comment)  Admission diagnosis:  Acute cystitis without hematuria [N30.00] Sepsis secondary to UTI (HCC) [A41.9, N39.0] Sepsis (HCC) [A41.9] Sepsis  with encephalopathy without septic shock, due to unspecified organism (HCC) [A41.9, R65.20, G93.41] Patient Active Problem List   Diagnosis Date Noted   Sepsis secondary to UTI (HCC) 11/02/2022   CAD (coronary artery disease) 11/02/2022   Type 2 diabetes mellitus (HCC) 11/02/2022    AKI (acute kidney injury) (HCC) 11/02/2022   Hyperlipidemia 11/02/2022   BPH (benign prostatic hyperplasia) 11/02/2022   Dementia without behavioral disturbance (HCC) 11/02/2022   Status post carotid endarterectomy 12/02/2020   Rectal bleeding    GI bleeding 08/06/2020   Chronic diarrhea    Antiplatelet or antithrombotic long-term use    Spinal stenosis of lumbar region 07/22/2015   Spinal stenosis of lumbar region with radiculopathy 08/05/2013   PCP:  Clinic, [facility] Pharmacy:   [facility] - [place], [place] - [address] [place] [address] Phone: [phone] Fax: [phone]  [facility] [address] [place]), [place] - [address] [place]) [place] [address] Phone: [phone] Fax: [phone]     Social Determinants of Health (SDOH) Social History: SDOH Screenings   Food Insecurity: No Food Insecurity (11/03/2022)  Housing: Low Risk  (11/03/2022)  Transportation Needs: No Transportation Needs (11/03/2022)  Utilities: Not At Risk (11/03/2022)  Tobacco Use: Medium Risk (11/03/2022)   SDOH Interventions:     Readmission Risk Interventions    11/05/2022   12:47 PM  Readmission Risk Prevention Plan  Transportation Screening Complete  PCP or Specialist Appt within 5-7 Days Complete  Home Care Screening Complete  Medication Review (RN CM) Complete

## 2022-11-05 NOTE — Evaluation (Signed)
Physical Therapy Evaluation Patient Details Name: [name] MRN: [id number] DOB: 1945/09/07 Today's Date: 11/05/2022  History of Present Illness  77 year old admitted due to weakness and confusion with UTI.  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  Clinical Impression  Pt admitted with above diagnosis. Pt was able to ambulate in room but needed min assist and mod cues for safety as he veers and leans right as well as posteriorly even with RW use. Reports multiple falls at home. Very unsteady and unsafe at times. Will benefit from post acute therapy < 2 hours day. Will follow acutely.  Pt currently with functional limitations due to the deficits listed below (see PT Problem List). Pt will benefit from acute skilled PT to increase their independence and safety with mobility to allow discharge.          Recommendations for follow up therapy are one component of a multi-disciplinary discharge planning process, led by the attending physician.  Recommendations may be updated based on patient status, additional functional criteria and insurance authorization.  Follow Up Recommendations Can patient physically be transported by private vehicle: No     Assistance Recommended at Discharge Frequent or constant Supervision/Assistance  Patient can return home with the following  A lot of help with walking and/or transfers;A lot of help with bathing/dressing/bathroom;Assistance with cooking/housework;Help with stairs or ramp for entrance;Assist for transportation    Equipment Recommendations None recommended by PT  Recommendations for Other Services       Functional Status Assessment Patient has had a recent decline in their functional status and demonstrates the ability to make significant improvements in  function in a reasonable and predictable amount of time.     Precautions / Restrictions Precautions Precautions: Fall Restrictions Weight Bearing Restrictions: No      Mobility  Bed Mobility Overal bed mobility: Needs Assistance Bed Mobility: Rolling, Sidelying to Sit, Sit to Supine Rolling: Min guard Sidelying to sit: Min assist Supine to sit: Min assist     General bed mobility comments: Increased time and mod instructional cueing needed for supine to sit but able to roll to the left side and push up from sidelying with min assist.    Transfers Overall transfer level: Needs assistance Equipment used: Rolling walker (2 wheels) Transfers: Bed to chair/wheelchair/BSC, Sit to/from Stand Sit to Stand: Mod assist           General transfer comment: LOB posteriorly and to the right upon standing the first tiem with right LE extending out.  Noted BM and asked NT to come in and assist PT as pt was unsteady. NT came in and pt stood with +2 min assist, cleaned pt and then he was able to obtain balance to walk.    Ambulation/Gait Ambulation/Gait assistance: Min assist, +2 safety/equipment Gait Distance (Feet): 35 Feet Assistive device: Rolling walker (2 wheels) Gait Pattern/deviations: Step-through pattern, Decreased stride length, Trunk flexed, Drifts right/left, Staggering left, Staggering right, Leaning posteriorly   Gait velocity interpretation: <1.31 ft/sec, indicative of household ambulator   General Gait Details: Pt ambulate to door and back to chair with RW with min assist and mod cues for safety. Pt with LOB to right and posteriorly needing assist. Pt with poor safety awareness with RW needing assist to steer RW as well.  Stairs  Wheelchair Mobility    Modified Rankin (Stroke Patients Only)       Balance Overall balance assessment: Needs assistance Sitting-balance support: No upper extremity supported, Bilateral upper extremity supported Sitting  balance-Leahy Scale: Poor Sitting balance - Comments: posterior LOB in static sitting   Standing balance support: Reliant on assistive device for balance, During functional activity, Bilateral upper extremity supported Standing balance-Leahy Scale: Poor Standing balance comment: UE support needed for standing balance                             Pertinent Vitals/Pain Pain Assessment Pain Assessment: No/denies pain    Home Living Family/patient expects to be discharged to:: Private residence Living Arrangements: Spouse/significant other;Children Available Help at Discharge: Family;Available 24 hours/day (per pt, wife and son assist him) Type of Home: House Home Access: Ramped entrance       Home Layout: Able to live on main level with bedroom/bathroom Home Equipment: Rolling Walker (2 wheels);Cane - single point;Rollator (4 wheels);Shower seat;Hand held shower head;Grab bars - tub/shower      Prior Function               Mobility Comments: Used rollator vs cane       Hand Dominance        Extremity/Trunk Assessment   Upper Extremity Assessment Upper Extremity Assessment: Defer to OT evaluation    Lower Extremity Assessment Lower Extremity Assessment: Generalized weakness    Cervical / Trunk Assessment Cervical / Trunk Assessment: Kyphotic  Communication   Communication: No difficulties  Cognition Arousal/Alertness: Awake/alert Behavior During Therapy: WFL for tasks assessed/performed Overall Cognitive Status: Impaired/Different from baseline Area of Impairment: Memory, Safety/judgement, Awareness                     Memory: Decreased short-term memory   Safety/Judgement: Decreased awareness of safety Awareness: Intellectual   General Comments: Pt needed mod instructional cueing for safe RW usage.        General Comments General comments (skin integrity, edema, etc.): VSS    Exercises     Assessment/Plan    PT Assessment  Patient needs continued PT services  PT Problem List Decreased activity tolerance;Decreased balance;Decreased mobility;Decreased knowledge of use of DME;Decreased safety awareness       PT Treatment Interventions DME instruction;Gait training;Functional mobility training;Therapeutic activities;Therapeutic exercise;Balance training;Patient/family education;Wheelchair mobility training    PT Goals (Current goals can be found in the Care Plan section)  Acute Rehab PT Goals Patient Stated Goal: to get better PT Goal Formulation: With patient Time For Goal Achievement: 11/19/22 Potential to Achieve Goals: Fair    Frequency Min 3X/week     Co-evaluation               AM-PAC PT "6 Clicks" Mobility  Outcome Measure Help needed turning from your back to your side while in a flat bed without using bedrails?: A Little Help needed moving from lying on your back to sitting on the side of a flat bed without using bedrails?: A Little Help needed moving to and from a bed to a chair (including a wheelchair)?: Total Help needed standing up from a chair using your arms (e.g., wheelchair or bedside chair)?: Total Help needed to walk in hospital room?: Total Help needed climbing 3-5 steps with a railing? : Total 6 Click Score: 10    End of Session Equipment Utilized During Treatment: Gait belt Activity Tolerance: Patient limited by fatigue Patient  left: in chair;with call bell/phone within reach;with chair alarm set Nurse Communication: Mobility status PT Visit Diagnosis: Unsteadiness on feet (R26.81);Muscle weakness (generalized) (M62.81)    Time: 2130-8657 PT Time Calculation (min) (ACUTE ONLY): 27 min   Charges:   PT Evaluation $PT Eval Moderate Complexity: 1 Mod PT Treatments $Gait Training: 8-22 mins        [facility] M,PT Acute Rehab Services [phone]   Bevelyn Buckles 11/05/2022, 4:12 PM

## 2022-11-05 NOTE — NC FL2 (Signed)
[place] MEDICAID FL2 LEVEL OF CARE FORM     IDENTIFICATION  Patient Name: [name] Birthdate: 1946-03-28 Sex: male Admission Date (Current Location): 11/02/2022  [facility] and [place] Number:  Producer, television/film/video and Address:  [facility] [place], [address], [place], [place] [address]      Provider Number: [id number]  Attending Physician Name and Address:  Hughie Closs, MD  Relative Name and Phone Number:  Orest, Brosh (Spouse)  [phone]    Current Level of Care: Hospital Recommended Level of Care: Skilled Nursing Facility Prior Approval Number:    Date Approved/Denied:   PASRR Number: [id number]  Discharge Plan: SNF    Current Diagnoses: Patient Active Problem List   Diagnosis Date Noted   Sepsis secondary to UTI (HCC) 11/02/2022   CAD (coronary artery disease) 11/02/2022   Type 2 diabetes mellitus (HCC) 11/02/2022   AKI (acute kidney injury) (HCC) 11/02/2022   Hyperlipidemia 11/02/2022   BPH (benign prostatic hyperplasia) 11/02/2022   Dementia without behavioral disturbance (HCC) 11/02/2022   Status post carotid endarterectomy 12/02/2020   Rectal bleeding    GI bleeding 08/06/2020   Chronic diarrhea    Antiplatelet or antithrombotic long-term use    Spinal stenosis of lumbar region 07/22/2015   Spinal stenosis of lumbar region with radiculopathy 08/05/2013    Orientation RESPIRATION BLADDER Height & Weight     Self, Time, Situation, Place  Normal Incontinent Weight: 199 lb (90.3 kg) Height:  5\' 9"  (175.3 cm)  BEHAVIORAL SYMPTOMS/MOOD NEUROLOGICAL BOWEL NUTRITION STATUS      Incontinent Diet (see discharge summary)  AMBULATORY STATUS COMMUNICATION OF NEEDS Skin   Extensive Assist Verbally Normal                       Personal Care Assistance Level of Assistance  Bathing, Feeding, Dressing Bathing Assistance: Maximum assistance Feeding assistance: Independent Dressing Assistance: Maximum assistance      Functional Limitations Info  Sight, Hearing, Speech Sight Info: Adequate Hearing Info: Adequate Speech Info: Adequate    SPECIAL CARE FACTORS FREQUENCY  PT (By licensed PT), OT (By licensed OT)     PT Frequency: 5x/week OT Frequency: 5x/week            Contractures Contractures Info: Not present    Additional Factors Info  Code Status, Allergies, Insulin Sliding Scale Code Status Info: FULL Allergies Info: Contrast Media (Iodinated Contrast Media)  Quinine Derivatives   Insulin Sliding Scale Info: see dischareg summary       Current Medications (11/05/2022):  This is the current hospital active medication list Current Facility-Administered Medications  Medication Dose Route Frequency Provider Last Rate Last Admin   acetaminophen (TYLENOL) tablet 650 mg  650 mg Oral Q6H PRN Regalado, Belkys A, MD   650 mg at 11/05/22 1606   amLODipine (NORVASC) tablet 10 mg  10 mg Oral Daily Earlie Lou L, MD   10 mg at 11/05/22 0927   carvedilol (COREG) tablet 6.25 mg  6.25 mg Oral BID WC Garba, Mohammad L, MD   6.25 mg at 11/05/22 1606   cefTRIAXone (ROCEPHIN) 2 g in sodium chloride 0.9 % 100 mL IVPB  2 g Intravenous Q24H Regalado, Belkys A, MD 200 mL/hr at 11/04/22 2204 2 g at 11/04/22 2204   clopidogrel (PLAVIX) tablet 75 mg  75 mg Oral Daily Rometta Emery, MD   75 mg at 11/05/22 5956   cyanocobalamin (VITAMIN B12) tablet 500 mcg  500 mcg Oral  Daily Rometta Emery, MD   500 mcg at 11/05/22 1610   donepezil (ARICEPT) tablet 10 mg  10 mg Oral q AM Rometta Emery, MD   10 mg at 11/05/22 0928   DULoxetine (CYMBALTA) DR capsule 20 mg  20 mg Oral QPM Earlie Lou L, MD   20 mg at 11/04/22 1702   enoxaparin (LOVENOX) injection 40 mg  40 mg Subcutaneous Daily Earlie Lou L, MD   40 mg at 11/05/22 0927   famotidine (PEPCID) tablet 20 mg  20 mg Oral QHS Earlie Lou L, MD   20 mg at 11/04/22 2159   insulin aspart (novoLOG) injection 0-20 Units  0-20 Units Subcutaneous TID  WC Rometta Emery, MD   11 Units at 11/05/22 1238   insulin aspart (novoLOG) injection 0-5 Units  0-5 Units Subcutaneous QHS Rometta Emery, MD   2 Units at 11/03/22 2202   insulin aspart (novoLOG) injection 2 Units  2 Units Subcutaneous TID WC Regalado, Belkys A, MD   2 Units at 11/05/22 1238   insulin glargine-yfgn (SEMGLEE) injection 10 Units  10 Units Subcutaneous QHS Pahwani, Daleen Bo, MD       linagliptin (TRADJENTA) tablet 5 mg  5 mg Oral QPM Mikeal Hawthorne, Mohammad L, MD   5 mg at 11/04/22 1702   loratadine (CLARITIN) tablet 10 mg  10 mg Oral Daily Earlie Lou L, MD   10 mg at 11/05/22 0928   losartan (COZAAR) tablet 100 mg  100 mg Oral Daily Earlie Lou L, MD   100 mg at 11/05/22 0928   memantine (NAMENDA) tablet 5 mg  5 mg Oral BID Earlie Lou L, MD   5 mg at 11/05/22 0928   nitroGLYCERIN (NITROSTAT) SL tablet 0.4 mg  0.4 mg Sublingual Q5 min PRN Rometta Emery, MD       ondansetron (ZOFRAN) tablet 4 mg  4 mg Oral Q6H PRN Rometta Emery, MD       Or   ondansetron (ZOFRAN) injection 4 mg  4 mg Intravenous Q6H PRN Earlie Lou L, MD   4 mg at 11/05/22 1351   pravastatin (PRAVACHOL) tablet 40 mg  40 mg Oral Daily Earlie Lou L, MD   40 mg at 11/05/22 0928   tamsulosin (FLOMAX) capsule 0.4 mg  0.4 mg Oral QPM Rometta Emery, MD   0.4 mg at 11/04/22 1702     Discharge Medications: Please see discharge summary for a list of discharge medications.  Relevant Imaging Results:  Relevant Lab Results:   Additional Information SSN: [id number]  Ammarie Matsuura A [place], [place]

## 2022-11-05 NOTE — Progress Notes (Signed)
   11/05/22 2011  BiPAP/CPAP/SIPAP  Reason BIPAP/CPAP not in use Non-compliant

## 2022-11-05 NOTE — Progress Notes (Signed)
[facility] CPAP unit @bedside . Pt expected to d/c to home tomorrow. Will continue to monitor. Nasal pressure wound healing well.

## 2022-11-05 NOTE — Evaluation (Signed)
Occupational Therapy Evaluation Patient Details Name: [name] MRN: [id number] DOB: [birth year] Today's Date: 11/05/2022   History of Present Illness 77 year old admitted due to weakness and confusion with UTI.  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   Clinical Impression   Pt currently needing mod assist for toileting and toilet transfers with max assist for LB dressing.  Increased LOB to the right and posteriorly with ambulation using the RW for support.  Pt's spouse in for session and expresses concern that pt has had a gradual decline over the past few months with regards to balance, with multiple falls occurring and abnormal functional mobility pattern.  Feel he will benefit from acute care OT at this time in order to increase balance and safety for return back to PLOF.  Feel patient will benefit from continued inpatient follow up therapy, <3 hours/day post acute discharge.        Recommendations for follow up therapy are one component of a multi-disciplinary discharge planning process, led by the attending physician.  Recommendations may be updated based on patient status, additional functional criteria and insurance authorization.   Assistance Recommended at Discharge Frequent or constant Supervision/Assistance  Patient can return home with the following A little help with walking and/or transfers;A little help with bathing/dressing/bathroom;Help with stairs or ramp for entrance;Assist for transportation;Assistance with cooking/housework;Direct supervision/assist for financial management;Direct supervision/assist for medications management    Functional Status Assessment  Patient has had a recent decline in their functional status and demonstrates the ability to make significant  improvements in function in a reasonable and predictable amount of time.  Equipment Recommendations  None recommended by OT       Precautions / Restrictions Precautions Precautions: Fall Restrictions Weight Bearing Restrictions: No      Mobility Bed Mobility Overal bed mobility: Needs Assistance Bed Mobility: Rolling, Sidelying to Sit, Sit to Supine Rolling: Min guard Sidelying to sit: Min guard Supine to sit: Supervision     General bed mobility comments: Increased time and mod instructional cueing needed for supine to sit but able to roll to the left side and push up from sidelying with min assist.    Transfers Overall transfer level: Needs assistance Equipment used: Rolling walker (2 wheels) Transfers: Bed to chair/wheelchair/BSC, Sit to/from Stand Sit to Stand: Min assist     Step pivot transfers: Mod assist     General transfer comment: LOB posteriorly and to the left      Balance Overall balance assessment: Needs assistance Sitting-balance support: Feet unsupported Sitting balance-Leahy Scale: Poor Sitting balance - Comments: posterior LOB in static sitting   Standing balance support: Reliant on assistive device for balance, During functional activity Standing balance-Leahy Scale: Poor Standing balance comment: UE support needed for standing balance                           ADL either performed or assessed with clinical judgement   ADL Overall ADL's : Needs assistance/impaired Eating/Feeding: Independent;Sitting   Grooming: Minimal assistance;Standing Grooming Details (indicate cue type and reason): simulated Upper Body Bathing: Supervision/ safety;Sitting Upper Body Bathing Details (indicate cue type and reason): simulated Lower Body Bathing: Moderate assistance;Sit to/from stand Lower Body Bathing Details (indicate cue type and reason): simulated Upper Body Dressing : Set up;Sitting Upper Body Dressing  Details (indicate cue type and  reason): simulated Lower Body Dressing: Maximal assistance;Sit to/from stand   Toilet Transfer: Moderate assistance;Ambulation;Rolling walker (2 wheels)   Toileting- Clothing Manipulation and Hygiene: Moderate assistance;Sit to/from stand       Functional mobility during ADLs: Moderate assistance;Rolling walker (2 wheels) General ADL Comments: Pt with increased LOB to the right and posteriorly with mobility and when standing unsupported and attempting to pull incontinence brief up.  Pt's spouse present and concerned about gradual decline over the past few months.  Multiple falls at home with increasing memory deficits.     Vision Baseline Vision/History: 0 No visual deficits Ability to See in Adequate Light: 0 Adequate Patient Visual Report: No change from baseline Vision Assessment?: No apparent visual deficits     Perception  WFL   Praxis  [facility]    Pertinent Vitals/Pain Pain Assessment Pain Assessment: Faces Pain Score: 0-No pain        Extremity/Trunk Assessment Upper Extremity Assessment Upper Extremity Assessment: Overall WFL for tasks assessed   Lower Extremity Assessment Lower Extremity Assessment: Defer to PT evaluation   Cervical / Trunk Assessment Cervical / Trunk Assessment: Kyphotic   Communication Communication Communication: No difficulties   Cognition Arousal/Alertness: Awake/alert Behavior During Therapy: WFL for tasks assessed/performed Overall Cognitive Status: Impaired/Different from baseline Area of Impairment: Memory, Safety/judgement, Awareness                     Memory: Decreased short-term memory   Safety/Judgement: Decreased awareness of safety Awareness: Intellectual   General Comments: Pt needed mod instructional cueing for safe RW usage.                Home Living Family/patient expects to be discharged to:: Private residence Living Arrangements: Spouse/significant other;Children Available Help at Discharge:  Family;Available 24 hours/day (per pt, wife and son assist him) Type of Home: House Home Access: Ramped entrance     Home Layout: Able to live on main level with bedroom/bathroom     Bathroom Shower/Tub: Arts development officer Toilet: Handicapped height     Home Equipment: Agricultural consultant (2 wheels);Cane - single point;Rollator (4 wheels);Shower seat;Hand held shower head;Grab bars - tub/shower          Prior Functioning/Environment               Mobility Comments: Used rollator vs cane          OT Problem List: Decreased strength;Decreased knowledge of use of DME or AE;Impaired balance (sitting and/or standing);Decreased safety awareness;Pain;Decreased cognition      OT Treatment/Interventions: Self-care/ADL training;Patient/family education;Balance training;Therapeutic activities;DME and/or AE instruction;Cognitive remediation/compensation    OT Goals(Current goals can be found in the care plan section) Acute Rehab OT Goals Patient Stated Goal: Pt's spouse wants him to be seen by a neurologist OT Goal Formulation: With patient/family Time For Goal Achievement: 11/19/22 Potential to Achieve Goals: Good  OT Frequency: Min 2X/week       AM-PAC OT "6 Clicks" Daily Activity     Outcome Measure Help from another person eating meals?: None Help from another person taking care of personal grooming?: A Little Help from another person toileting, which includes using toliet, bedpan, or urinal?: A Lot Help from another person bathing (including washing, rinsing, drying)?: A Lot Help from another person to put on and taking off regular upper body clothing?: A Little Help from another person to put on and taking off regular lower body clothing?: A Lot 6 Click Score:  16   End of Session Equipment Utilized During Treatment: Gait belt;Rolling walker (2 wheels) Nurse Communication: Mobility status  Activity Tolerance: Patient tolerated treatment well Patient left: in  chair;with call bell/phone within reach;with chair alarm set;with family/visitor present  OT Visit Diagnosis: Unsteadiness on feet (R26.81);Repeated falls (R29.6);Muscle weakness (generalized) (M62.81);Other abnormalities of gait and mobility (R26.89)                Time: 1338-1440 OT Time Calculation (min): 62 min Charges:  OT General Charges $OT Visit: 1 Visit OT Evaluation $OT Eval Moderate Complexity: 1 Mod OT Treatments $Self Care/Home Management : 38-52 mins Perrin Maltese, OTR/L Acute Rehabilitation Services  Office [phone] 11/05/2022

## 2022-11-05 NOTE — TOC Progression Note (Signed)
Transition of Care [facility]) - Progression Note    Patient Details  Name: [name] MRN: [id number] Date of Birth: September 08, 1945  Transition of Care [facility]) CM/SW Contact  Yusif Gnau A [place], [place] Phone Number: 11/05/2022, 5:02 PM  Clinical Narrative:    CSW was contacted by French Ana at [employer]. She said that pt's insurance, BCBS Medicare does not cover any skilled services.  CSW updated pt and pt's wife. List of VA in-network facilities provided. Referral information sent to [facility] for review.   Barriers to discharge: bed availability, insurance authorization TOC will continue to follow.    CSW met with pt and pt's wife, [name] at bedside. She said that she was nervous about pt going to SNF but felt that it would be useful. She said that her preference was for [facility].    CSW will complete SNF workup.   TOC will continue to follow.   Expected Discharge Plan: Skilled Nursing Facility Barriers to Discharge: Continued Medical Work up  Expected Discharge Plan and Services   Discharge Planning Services: CM Consult Post Acute Care Choice: Skilled Nursing Facility Living arrangements for the past 2 months: Single Family Home                                       Social Determinants of Health (SDOH) Interventions SDOH Screenings   Food Insecurity: No Food Insecurity (11/03/2022)  Housing: Low Risk  (11/03/2022)  Transportation Needs: No Transportation Needs (11/03/2022)  Utilities: Not At Risk (11/03/2022)  Tobacco Use: Medium Risk (11/03/2022)    Readmission Risk Interventions    11/05/2022   12:47 PM  Readmission Risk Prevention Plan  Transportation Screening Complete  PCP or Specialist Appt within 5-7 Days Complete  Home Care Screening Complete  Medication Review (RN CM) Complete

## 2022-11-06 DIAGNOSIS — N390 Urinary tract infection, site not specified: Secondary | ICD-10-CM | POA: Diagnosis not present

## 2022-11-06 DIAGNOSIS — A419 Sepsis, unspecified organism: Secondary | ICD-10-CM | POA: Diagnosis not present

## 2022-11-06 LAB — CULTURE, BLOOD (ROUTINE X 2): Culture: NO GROWTH

## 2022-11-06 LAB — BASIC METABOLIC PANEL
Anion gap: 10 (ref 5–15)
BUN: 25 mg/dL — ABNORMAL HIGH (ref 8–23)
CO2: 25 mmol/L (ref 22–32)
Calcium: 8.6 mg/dL — ABNORMAL LOW (ref 8.9–10.3)
Chloride: 101 mmol/L (ref 98–111)
Creatinine, Ser: 1.4 mg/dL — ABNORMAL HIGH (ref 0.61–1.24)
GFR, Estimated: 51 mL/min — ABNORMAL LOW (ref >60)
Glucose, Bld: 170 mg/dL — ABNORMAL HIGH (ref 70–99)
Potassium: 4 mmol/L (ref 3.5–5.1)
Sodium: 136 mmol/L (ref 135–145)

## 2022-11-06 LAB — GLUCOSE, CAPILLARY
Glucose-Capillary: 153 mg/dL — ABNORMAL HIGH (ref 70–99)
Glucose-Capillary: 216 mg/dL — ABNORMAL HIGH (ref 70–99)
Glucose-Capillary: 173 mg/dL — ABNORMAL HIGH (ref 70–99)
Glucose-Capillary: 190 mg/dL — ABNORMAL HIGH (ref 70–99)

## 2022-11-06 NOTE — TOC Initial Note (Addendum)
Transition of Care [facility]) - Initial/Assessment Note    Patient Details  Name: [name] MRN: [id number] Date of Birth: 07/24/1945  Transition of Care [facility]) CM/SW Contact:    [name], [name] Phone Number: 11/06/2022, 11:43 AM  Clinical Narrative:                 CSW contacted pt's wife, [name] with information that [facility] accepted pt.  She contacted her insurance company to confirm that her [employer] was in network. Pt and pt's wife prefer to utilize BCBS due to [employer] not being in network with [facility].  CSW attempted to complete insurance authorization, however was unable due to not being in the [employer] portal.  [name] at [facility] was notified and was able to start auth at the facility.  CSW provided [name]'s contact information to [facility] to assist with questions specific to the SNF. Bed is available as early as today.   Barriers to discharge: Insurance authorization.   TOC will continue to follow.    CSW received contact from [place] at [facility] stating pt bed offer has been extended. CSW will follow up with pt and pt's wife regarding bed offer. Insurance authorization to be started.   TOC will continue to follow.  Expected Discharge Plan: Skilled Nursing Facility Barriers to Discharge: Continued Medical Work up   Patient Goals and CMS Choice Patient states their goals for this hospitalization and ongoing recovery are:: To get better CMS Medicare.gov Compare Post Acute Care list provided to:: Patient Choice offered to / list presented to : Patient [place] ownership interest in [facility] [place].provided to:: Patient    Expected Discharge Plan and Services   Discharge Planning Services: CM Consult Post Acute Care Choice: Skilled Nursing Facility Living arrangements for the past 2 months: Single Family Home                                      Prior Living Arrangements/Services Living arrangements for the past 2  months: Single Family Home Lives with:: Spouse Patient language and need for interpreter reviewed:: Yes Do you feel safe going back to the place where you live?: Yes      Need for Family Participation in Patient Care: Yes (Comment) Care giver support system in place?: Yes (comment) Current home services: DME (DME at the home includes RW, Cane) Criminal Activity/Legal Involvement Pertinent to Current Situation/Hospitalization: No - Comment as needed  Activities of Daily Living Home Assistive Devices/Equipment: Other (Comment) ADL Screening (condition at time of admission) Patient's cognitive ability adequate to safely complete daily activities?: Yes Is the patient deaf or have difficulty hearing?: No Does the patient have difficulty seeing, even when wearing glasses/contacts?: No Does the patient have difficulty concentrating, remembering, or making decisions?: No Patient able to express need for assistance with ADLs?: Yes Does the patient have difficulty dressing or bathing?: No Independently performs ADLs?: Yes (appropriate for developmental age) Does the patient have difficulty walking or climbing stairs?: Yes Weakness of Legs: Both Weakness of Arms/Hands: None  Permission Sought/Granted Permission sought to share information with : Case Manager, Family Supports, Oceanographer granted to share information with : Yes, Verbal Permission Granted     Permission granted to share info w AGENCY: SNF facilities for STR placement  Permission granted to share info w Relationship: spouse - [name] - [phone]  Emotional Assessment Appearance:: Appears stated age Attitude/Demeanor/Rapport: Gracious Affect (typically observed): Accepting Orientation: : Oriented to Self, Oriented to Place, Oriented to  Time, Oriented to Situation Alcohol / Substance Use: Not Applicable Psych Involvement: No (comment)  Admission diagnosis:  Acute cystitis without  hematuria [N30.00] Sepsis secondary to UTI (HCC) [A41.9, N39.0] Sepsis (HCC) [A41.9] Sepsis with encephalopathy without septic shock, due to unspecified organism (HCC) [A41.9, R65.20, G93.41] Patient Active Problem List   Diagnosis Date Noted   Sepsis secondary to UTI (HCC) 11/02/2022   CAD (coronary artery disease) 11/02/2022   Type 2 diabetes mellitus (HCC) 11/02/2022   AKI (acute kidney injury) (HCC) 11/02/2022   Hyperlipidemia 11/02/2022   BPH (benign prostatic hyperplasia) 11/02/2022   Dementia without behavioral disturbance (HCC) 11/02/2022   Status post carotid endarterectomy 12/02/2020   Rectal bleeding    GI bleeding 08/06/2020   Chronic diarrhea    Antiplatelet or antithrombotic long-term use    Spinal stenosis of lumbar region 07/22/2015   Spinal stenosis of lumbar region with radiculopathy 08/05/2013   PCP:  Clinic, [facility] Pharmacy:   [facility] - [place], [place] - [address] [place] [address] Phone: [phone] Fax: [phone]  [id number] [address] [place]),  - [address] [place] [address] Phone: [phone] Fax: [phone]     Social Determinants of Health (SDOH) Social History: SDOH Screenings   Food Insecurity: No Food Insecurity (11/03/2022)  Housing: Low Risk  (11/03/2022)  Transportation Needs: No Transportation Needs (11/03/2022)  Utilities: Not At Risk (11/03/2022)  Tobacco Use: Medium Risk (11/03/2022)   SDOH Interventions:     Readmission Risk Interventions    11/05/2022   12:47 PM  Readmission Risk Prevention Plan  Transportation Screening Complete  PCP or Specialist Appt within 5-7 Days Complete  Home Care Screening Complete  Medication Review (RN CM) Complete

## 2022-11-06 NOTE — Progress Notes (Signed)
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  Assessment & Plan:   Principal Problem:   Sepsis secondary to UTI [facility]) Active Problems:   CAD (coronary artery disease)   Type 2 diabetes mellitus (HCC)   Status post carotid endarterectomy   AKI (acute kidney injury) (HCC)   Hyperlipidemia   BPH (benign prostatic hyperplasia)   Dementia without behavioral disturbance (HCC)  Sepsis secondary to UTI POA: Met sepsis criteria based on fever, leukocytosis and tachycardia.  UA consistent with UTI, urine culture growing E. coli which is pansensitive.  Last temperature spike was of 102.4 was on 11/03/2022.  He is afebrile since then.  Continue Rocephin for 5 to 7 days.  Generalized weakness: Secondary to sepsis.  Seen by PT OT and they recommend SNF.  Patient and wife in agreement.  TOC consulted.  Pending placement.  CKD stage IIIa: At baseline.  Type 2 diabetes mellitus: Hemoglobin A1c 7.6.  Takes glipizide, Tradjenta and metformin at home.  Currently on Semglee 10 units, Tradjenta and SSI.  Blood sugar controlled.  Hyperlipidemia: Continue Pravachol.  Dementia without behavioral disturbances: Continue Namenda, Aricept and Cymbalta.  History of BPH: Continue Flomax.  PAD: Continue pravastatin and Plavix.  Essential hypertension: Controlled.  Continue Norvasc, Coreg and Cozaar.  DVT prophylaxis: enoxaparin (LOVENOX) injection 40 mg Start: 11/03/22 1000   Code Status: Full Code  Family Communication:  None present at bedside.  Plan of care discussed with patient  in length and he/she verbalized understanding and agreed with it.  Also discussed with his wife over the phone.  Status is: Inpatient Remains inpatient appropriate because: Medically stable, pending placement to SNF.  TOC on board.   Estimated body mass index is 29.39 kg/m as calculated from the following:   Height as of this encounter: 5\' 9"  (1.753 m).   Weight as of this encounter: 90.3 kg.    Nutritional Assessment: Body mass index is 29.39 kg/m.Marland Kitchen Seen by dietician.  I agree with the assessment and plan as outlined below: Nutrition Status:        . Skin Assessment: I have examined the patient's skin and I agree with the wound assessment as performed by the wound care RN as outlined below:    Consultants:  None  Procedures:  None  Antimicrobials:  Anti-infectives (From admission, onward)    Start     Dose/Rate Route Frequency Ordered Stop   11/03/22 2200  cefTRIAXone (ROCEPHIN) 2 g in sodium chloride 0.9 % 100 mL IVPB  Status:  Discontinued        2 g 200 mL/hr over 30 Minutes Intravenous Every 24 hours 11/03/22 0737 11/03/22 1810   11/03/22 2200  cefTRIAXone (ROCEPHIN) 2 g in sodium chloride 0.9 % 100 mL IVPB        2 g 200 mL/hr over 30 Minutes Intravenous Every 24 hours 11/03/22 1810 11/10/22 2159   11/02/22 2230  cefTRIAXone (ROCEPHIN) 2 g in sodium chloride 0.9 % 100 mL IVPB        2 g 200 mL/hr over 30 Minutes Intravenous  Once 11/02/22 2224 11/03/22 0014  Subjective: Patient seen and examined.  No complaints.  Objective: Vitals:   11/05/22 1651 11/05/22 1938 11/06/22 0458 11/06/22 0812  BP: (!) 124/92 138/61 (!) 140/72 126/89  Pulse: (!) 56 60 66 63  Resp: 18 18 18 18   Temp: 98 F (36.7 C) 97.6 F (36.4 C) 97.7 F (36.5 C) 97.8 F (36.6 C)  TempSrc: Oral Oral Oral   SpO2: 98% 100% 97% 98%  Weight:      Height:        Intake/Output Summary (Last 24 hours) at 11/06/2022 1052 Last data filed at 11/06/2022 0903 Gross per 24 hour   Intake 240 ml  Output 750 ml  Net -510 ml    Filed Weights   11/02/22 2028  Weight: 90.3 kg    Examination:  General exam: Appears calm and comfortable  Respiratory system: Clear to auscultation. Respiratory effort normal. Cardiovascular system: S1 & S2 heard, RRR. No JVD, murmurs, rubs, gallops or clicks. No pedal edema. Gastrointestinal system: Abdomen is nondistended, soft and nontender. No organomegaly or masses felt. Normal bowel sounds heard. Central nervous system: Alert and oriented. No focal neurological deficits. Extremities: Symmetric 5 x 5 power. Skin: No rashes, lesions or ulcers.  Psychiatry: Judgement and insight appear normal. Mood & affect appropriate.   Data Reviewed: I have personally reviewed following labs and imaging studies  CBC: Recent Labs  Lab 11/02/22 2034 11/03/22 0814 11/04/22 0632 11/05/22 0420  WBC 16.7* 16.8* 13.2* 9.7  HGB 11.2* 11.0* 9.7* 8.9*  HCT 33.3* 33.0* 29.5* 27.4*  MCV 90.5 89.2 88.6 89.5  PLT 261 235 195 185    Basic Metabolic Panel: Recent Labs  Lab 11/02/22 2034 11/03/22 0814 11/04/22 0632 11/05/22 0420 11/06/22 0820  NA 136 136 134* 136 136  K 4.5 3.9 4.1 3.9 4.0  CL 100 98 100 99 101  CO2 23 22 23 23 25   GLUCOSE 232* 231* 202* 222* 170*  BUN 24* 21 23 30* 25*  CREATININE 1.51* 1.40* 1.58* 1.75* 1.40*  CALCIUM 9.5 8.9 8.4* 8.5* 8.6*    GFR: Estimated Creatinine Clearance: 47.5 mL/min (A) (by C-G formula based on SCr of 1.4 mg/dL (H)). Liver Function Tests: Recent Labs  Lab 11/02/22 2139 11/03/22 0814  AST 25 34  ALT 48* 49*  ALKPHOS 76 70  BILITOT 0.7 0.6  PROT 7.1 6.7  ALBUMIN 3.7 3.4*    No results for input(s): "LIPASE", "AMYLASE" in the last 168 hours. No results for input(s): "AMMONIA" in the last 168 hours. Coagulation Profile: Recent Labs  Lab 11/02/22 2139 11/03/22 0814  INR 1.1 1.2    Cardiac Enzymes: No results for input(s): "CKTOTAL", "CKMB", "CKMBINDEX", "TROPONINI" in the last  168 hours. BNP (last 3 results) No results for input(s): "PROBNP" in the last 8760 hours. HbA1C: No results for input(s): "HGBA1C" in the last 72 hours.  CBG: Recent Labs  Lab 11/05/22 0821 11/05/22 1219 11/05/22 1656 11/05/22 1939 11/06/22 0810  GLUCAP 187* 269* 187* 177* 153*    Lipid Profile: No results for input(s): "CHOL", "HDL", "LDLCALC", "TRIG", "CHOLHDL", "LDLDIRECT" in the last 72 hours. Thyroid Function Tests: Recent Labs    11/04/22 0406  TSH 0.550    Anemia Panel: Recent Labs    11/04/22 0406  VITAMINB12 421    Sepsis Labs: Recent Labs  Lab 11/02/22 2139 11/02/22 2322 11/03/22 0814  PROCALCITON  --   --  0.14  LATICACIDVEN 3.4* 2.2*  --      Recent Results (from the past 240  hour(s))  Blood Culture (routine x 2)     Status: None (Preliminary result)   Collection Time: 11/02/22  8:43 PM   Specimen: BLOOD  Result Value Ref Range Status   Specimen Description BLOOD RIGHT ANTECUBITAL  Final   Special Requests   Final    BOTTLES DRAWN AEROBIC AND ANAEROBIC Blood Culture results may not be optimal due to an excessive volume of blood received in culture bottles   Culture   Final    NO GROWTH 4 DAYS Performed at [facility], [address], [place], [place] [address]    Report Status PENDING  Incomplete  Urine Culture     Status: Abnormal   Collection Time: 11/02/22  9:31 PM   Specimen: Urine, Clean Catch  Result Value Ref Range Status   Specimen Description URINE, CLEAN CATCH  Final   Special Requests   Final    NONE Performed at [facility], [address], [place], [place] [address]    Culture >=100,000 COLONIES/mL ESCHERICHIA COLI (A)  Final   Report Status 11/04/2022 FINAL  Final   Organism ID, Bacteria ESCHERICHIA COLI (A)  Final      Susceptibility   Escherichia coli - MIC*    AMPICILLIN <=2 SENSITIVE Sensitive     CEFAZOLIN <=4 SENSITIVE Sensitive     CEFEPIME <=0.12 SENSITIVE Sensitive     CEFTRIAXONE <=0.25  SENSITIVE Sensitive     CIPROFLOXACIN <=0.25 SENSITIVE Sensitive     GENTAMICIN <=1 SENSITIVE Sensitive     IMIPENEM <=0.25 SENSITIVE Sensitive     NITROFURANTOIN <=16 SENSITIVE Sensitive     TRIMETH/SULFA <=20 SENSITIVE Sensitive     AMPICILLIN/SULBACTAM <=2 SENSITIVE Sensitive     PIP/TAZO <=4 SENSITIVE Sensitive     * >=100,000 COLONIES/mL ESCHERICHIA COLI  Blood Culture (routine x 2)     Status: None (Preliminary result)   Collection Time: 11/02/22  9:50 PM   Specimen: BLOOD LEFT HAND  Result Value Ref Range Status   Specimen Description BLOOD LEFT HAND  Final   Special Requests   Final    BOTTLES DRAWN AEROBIC AND ANAEROBIC Blood Culture results may not be optimal due to an excessive volume of blood received in culture bottles   Culture   Final    NO GROWTH 4 DAYS Performed at [facility], [address] [place], [place] [address]    Report Status PENDING  Incomplete  SARS Coronavirus 2 by RT PCR (hospital order, performed in [facility] hospital lab) *cepheid single result test* Anterior Nasal Swab     Status: None   Collection Time: 11/02/22 10:51 PM   Specimen: Anterior Nasal Swab  Result Value Ref Range Status   SARS Coronavirus 2 by RT PCR NEGATIVE NEGATIVE Final    Comment: Performed at [facility], [address], [place], [place] [address]     Radiology Studies: No results found.  Scheduled Meds:  amLODipine  10 mg Oral Daily   carvedilol  6.25 mg Oral BID WC   clopidogrel  75 mg Oral Daily   cyanocobalamin  500 mcg Oral Daily   donepezil  10 mg Oral q AM   DULoxetine  20 mg Oral QPM   enoxaparin (LOVENOX) injection  40 mg Subcutaneous Daily   famotidine  20 mg Oral QHS   insulin aspart  0-20 Units Subcutaneous TID WC   insulin aspart  0-5 Units Subcutaneous QHS   insulin aspart  2 Units Subcutaneous  TID WC   insulin glargine-yfgn  10 Units Subcutaneous QHS   linagliptin  5 mg Oral QPM   loratadine  10 mg Oral Daily   losartan  100 mg Oral  Daily   memantine  5 mg Oral BID   pravastatin  40 mg Oral Daily   tamsulosin  0.4 mg Oral QPM   Continuous Infusions:  cefTRIAXone (ROCEPHIN)  IV 2 g (11/05/22 2123)     LOS: 4 days   Hughie Closs, MD [facility]  11/06/2022, 10:52 AM   *Please note that this is a verbal dictation therefore any spelling or grammatical errors are due to the "Dragon [employer] One" system interpretation.  Please page via Amion and do not message via secure chat for urgent patient care matters. Secure chat can be used for non urgent patient care matters.  How to contact the [facility] Attending or Consulting provider 7A - 7P or covering provider during after hours 7P -7A, for this patient?  Check the care team in [facility]: Lambert Rhonda W and look for a) attending/consulting TR[facility] provider listed and b) the [facility] team listed. Page or secure chat 7A-7P. Log into [email or web address] and use [place]'s universal password to access. If you do not have the password, please contact the hospital operator. Locate the [facility] provider you are looking for under [facility] and page to a number that you can be directly reached. If you still have difficulty reaching the provider, please page the [facility] (Director on Call) for the Hospitalists listed on amion for assistance.

## 2022-11-06 NOTE — Progress Notes (Signed)
Patient refused CPAP for the night  

## 2022-11-07 DIAGNOSIS — A419 Sepsis, unspecified organism: Secondary | ICD-10-CM | POA: Diagnosis not present

## 2022-11-07 DIAGNOSIS — N390 Urinary tract infection, site not specified: Secondary | ICD-10-CM | POA: Diagnosis not present

## 2022-11-07 LAB — GLUCOSE, CAPILLARY
Glucose-Capillary: 163 mg/dL — ABNORMAL HIGH (ref 70–99)
Glucose-Capillary: 271 mg/dL — ABNORMAL HIGH (ref 70–99)
Glucose-Capillary: 164 mg/dL — ABNORMAL HIGH (ref 70–99)
Glucose-Capillary: 228 mg/dL — ABNORMAL HIGH (ref 70–99)

## 2022-11-07 LAB — CULTURE, BLOOD (ROUTINE X 2): Culture: NO GROWTH

## 2022-11-07 MED ORDER — INSULIN GLARGINE-YFGN 100 UNIT/ML ~~LOC~~ SOLN
15.0000 [IU] | Freq: Every day | SUBCUTANEOUS | Status: DC
  Administered 2022-11-07 – 2022-11-08 (×2): 15 [IU] via SUBCUTANEOUS
  Filled 2022-11-07 (×3): qty 0.15

## 2022-11-07 NOTE — Progress Notes (Signed)
Physical Therapy Treatment Patient Details Name: [name] MRN: [id number] DOB: 02-Sep-1945 Today's Date: 11/07/2022   History of Present Illness 77 year old admitted due to weakness and confusion with UTI.  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    PT Comments    Pt was seen for mobility on bed with pt declining to get up as he has been up already for another staff member.  Pt is fatigued with previous effort, would therefore recommend them to send to <3 hours a day rehab as he is limited in standing and endurance of gait.  Follow along with him for progression of mobility as he can tolerate.  Recommendations for follow up therapy are one component of a multi-disciplinary discharge planning process, led by the attending physician.  Recommendations may be updated based on patient status, additional functional criteria and insurance authorization.  Follow Up Recommendations  Can patient physically be transported by private vehicle: No    Assistance Recommended at Discharge Frequent or constant Supervision/Assistance  Patient can return home with the following A lot of help with walking and/or transfers;A lot of help with bathing/dressing/bathroom;Assistance with cooking/housework;Help with stairs or ramp for entrance;Assist for transportation   Equipment Recommendations  None recommended by PT    Recommendations for Other Services       Precautions / Restrictions Precautions Precautions: Fall Restrictions Weight Bearing Restrictions: No     Mobility  Bed Mobility Overal bed mobility: Needs Assistance Bed Mobility: Rolling Rolling: Min guard         General bed mobility comments: scooting up in bed max assist    Transfers                   General transfer comment:  declined to attempt    Ambulation/Gait               General Gait Details: refused to stand   Stairs             Wheelchair Mobility    Modified Rankin (Stroke Patients Only)       Balance     Sitting balance-Leahy Scale: Fair                                      Cognition Arousal/Alertness: Awake/alert Behavior During Therapy: WFL for tasks assessed/performed Overall Cognitive Status: Within Functional Limits for tasks assessed Area of Impairment: Attention, Following commands, Awareness, Problem solving                   Current Attention Level: Selective Memory: Decreased recall of precautions, Decreased short-term memory Following Commands: Follows one step commands with increased time Safety/Judgement: Decreased awareness of deficits Awareness: Intellectual Problem Solving: Slow processing General Comments: pt is requring repetitive instructions for completion of movement with legs, refusing to stand but also has PF contractures and poor awareness of the issues of his legs        Exercises General Exercises - Lower Extremity Ankle Circles/Pumps: AROM, 10 reps Quad Sets: AROM, 20 reps Gluteal Sets: AROM, 20 reps Heel Slides: AROM, 20 reps Hip ABduction/ADduction: AAROM, 20 reps    General Comments General comments (skin integrity, edema, etc.): Pt had been up to walk with staff member and  declined OOB but agreed to exercises.      Pertinent Vitals/Pain Pain Assessment Pain Assessment: Faces Faces Pain Scale: No hurt Pain Intervention(s): Monitored during session    Home Living                          Prior Function            PT Goals (current goals can now be found in the care plan section) Acute Rehab PT Goals Patient Stated Goal: to get home    Frequency    Min 3X/week      PT Plan      Co-evaluation              AM-PAC PT "6 Clicks" Mobility   Outcome Measure  Help needed  turning from your back to your side while in a flat bed without using bedrails?: A Little Help needed moving from lying on your back to sitting on the side of a flat bed without using bedrails?: A Little Help needed moving to and from a bed to a chair (including a wheelchair)?: A Lot Help needed standing up from a chair using your arms (e.g., wheelchair or bedside chair)?: A Lot Help needed to walk in hospital room?: A Lot Help needed climbing 3-5 steps with a railing? : A Lot 6 Click Score: 14    End of Session   Activity Tolerance: Patient limited by fatigue Patient left: in bed;with call bell/phone within reach;with bed alarm set Nurse Communication: Mobility status PT Visit Diagnosis: Unsteadiness on feet (R26.81);Muscle weakness (generalized) (M62.81)     Time: 5409-8119 PT Time Calculation (min) (ACUTE ONLY): 23 min  Charges:  $Therapeutic Exercise: 8-22 mins $Therapeutic Activity: 8-22 mins            Ivar Drape 11/07/2022, 3:44 PM  Samul Dada, PT PhD Acute Rehab Dept. Number: [facility] [id number] and [facility] [phone]

## 2022-11-07 NOTE — Progress Notes (Signed)
Occupational Therapy Treatment Patient Details Name: [name] MRN: [id number] DOB: Mar 21, 1946 Today's Date: 11/07/2022   History of present illness 77 year old admitted due to weakness and confusion with UTI.  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   OT comments  Pt currently still with increased posterior LOB in standing during grooming tasks and with mobility at times using the RW for support during toilet transfers and toileting.  Feel he will continue to benefit from acute care OT to further work on ADL independence and balance.  Will continue to follow.   Recommendations for follow up therapy are one component of a multi-disciplinary discharge planning process, led by the attending physician.  Recommendations may be updated based on patient status, additional functional criteria and insurance authorization.    Assistance Recommended at Discharge Frequent or constant Supervision/Assistance  Patient can return home with the following  A little help with walking and/or transfers;A little help with bathing/dressing/bathroom;Help with stairs or ramp for entrance;Assist for transportation;Assistance with cooking/housework;Direct supervision/assist for financial management;Direct supervision/assist for medications management   Equipment Recommendations  None recommended by OT       Precautions / Restrictions Precautions Precautions: Fall Restrictions Weight Bearing Restrictions: No       Mobility Bed Mobility Overal bed mobility: Needs Assistance Bed Mobility: Rolling, Sidelying to Sit Rolling: Min guard Sidelying to sit: Min assist       General bed mobility comments: Min instructional cueing for sequencing supine to sit and min assist for bringing trunk up to midline from  sidelying.    Transfers Overall transfer level: Needs assistance Equipment used: Rolling walker (2 wheels) Transfers: Bed to chair/wheelchair/BSC, Sit to/from Stand Sit to Stand: Min assist Stand pivot transfers: Min assist         General transfer comment: Posterior LOB with initial standing     Balance Overall balance assessment: Needs assistance Sitting-balance support: Feet supported, No upper extremity supported Sitting balance-Leahy Scale: Fair     Standing balance support: No upper extremity supported, During functional activity Standing balance-Leahy Scale: Poor Standing balance comment: LOB posteriorly in standing with grooming tasks and with mobility                           ADL either performed or assessed with clinical judgement   ADL Overall ADL's : Needs assistance/impaired     Grooming: Wash/dry hands;Wash/dry face;Standing;Minimal assistance                   Toilet Transfer: Minimal assistance;Ambulation;Rolling walker (2 wheels)   Toileting- Clothing Manipulation and Hygiene: Minimal assistance;Sit to/from stand       Functional mobility during ADLs: Minimal assistance;Rolling walker (2 wheels) General ADL Comments: Pt still with increased posterior LOB when standing unsupported while completing clothing management pre and post toileting and when washing hands at the sink.  Worked on standing balance at [facility] with small head movements.  Pt able to maintain static standing balance with small side to side head movements but loses balance when tilting head to look up to the ceiling.      Cognition Arousal/Alertness: Awake/alert Behavior During Therapy: WFL for tasks assessed/performed Overall Cognitive Status: Within Functional Limits for tasks assessed Area of Impairment: Memory  Memory: Decreased short-term memory         General Comments: Pt needs min instructional cueing for sequencing sit to stand  but  reports understanding that it is not safe for him to try and get up on his own.                   Pertinent Vitals/ Pain       Pain Assessment Pain Assessment: No/denies pain         Frequency  Min 2X/week        Progress Toward Goals  OT Goals(current goals can now be found in the care plan section)  Progress towards OT goals: Progressing toward goals  Acute Rehab OT Goals Patient Stated Goal: Pt reports he's going to rehab and the MD said he might leave today. OT Goal Formulation: With patient/family Time For Goal Achievement: 11/19/22 Potential to Achieve Goals: Good  Plan Discharge plan remains appropriate       AM-PAC OT "6 Clicks" Daily Activity     Outcome Measure   Help from another person eating meals?: None Help from another person taking care of personal grooming?: A Little Help from another person toileting, which includes using toliet, bedpan, or urinal?: A Little Help from another person bathing (including washing, rinsing, drying)?: A Little Help from another person to put on and taking off regular upper body clothing?: A Little Help from another person to put on and taking off regular lower body clothing?: A Little 6 Click Score: 19    End of Session Equipment Utilized During Treatment: Gait belt;Rolling walker (2 wheels)  OT Visit Diagnosis: Unsteadiness on feet (R26.81);Repeated falls (R29.6);Muscle weakness (generalized) (M62.81);Other abnormalities of gait and mobility (R26.89)   Activity Tolerance Patient tolerated treatment well   Patient Left in chair;with call bell/phone within reach;with chair alarm set;with family/visitor present   Nurse Communication Mobility status        Time: 1610-9604 OT Time Calculation (min): 45 min  Charges: OT General Charges $OT Visit: 1 Visit OT Treatments $Self Care/Home Management : 23-37 mins  Perrin Maltese, OTR/L Acute Rehabilitation Services  Office [phone] 11/07/2022

## 2022-11-07 NOTE — Progress Notes (Signed)
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  Assessment & Plan:   Principal Problem:   Sepsis secondary to UTI [facility]) Active Problems:   CAD (coronary artery disease)   Type 2 diabetes mellitus (HCC)   Status post carotid endarterectomy   AKI (acute kidney injury) (HCC)   Hyperlipidemia   BPH (benign prostatic hyperplasia)   Dementia without behavioral disturbance (HCC)  Sepsis secondary to UTI POA: Met sepsis criteria based on fever, leukocytosis and tachycardia.  UA consistent with UTI, urine culture growing E. coli which is pansensitive.  Last temperature spike was of 102.4 was on 11/03/2022.  He is afebrile since then.  Continue Rocephin for 7 days.  Generalized weakness: Secondary to sepsis.  Seen by PT OT and they recommend SNF.  Patient and wife in agreement.  TOC consulted.  Pending placement.  CKD stage IIIa: At baseline.  Type 2 diabetes mellitus: Hemoglobin A1c 7.6.  Takes glipizide, Tradjenta and metformin at home.  Currently on Semglee 10 units, Tradjenta and SSI.  Blood sugar slightly elevated, will increase Semglee to 15 units.  Hyperlipidemia: Continue Pravachol.  Dementia without behavioral disturbances: Continue Namenda, Aricept and Cymbalta.  History of BPH: Continue Flomax.  PAD: Continue pravastatin and Plavix.  Essential hypertension: Controlled.  Continue Norvasc, Coreg and Cozaar.  DVT prophylaxis: enoxaparin (LOVENOX) injection 40 mg Start: 11/03/22 1000   Code Status: Full Code  Family Communication:  None present at bedside.   Plan of care discussed with patient in length and he/she verbalized understanding and agreed with it.  Also discussed with his wife over the phone.  Status is: Inpatient Remains inpatient appropriate because: Medically stable, pending placement to SNF.  TOC on board.   Estimated body mass index is 29.39 kg/m as calculated from the following:   Height as of this encounter: 5\' 9"  (1.753 m).   Weight as of this encounter: 90.3 kg.    Nutritional Assessment: Body mass index is 29.39 kg/m.Marland Kitchen Seen by dietician.  I agree with the assessment and plan as outlined below: Nutrition Status:        . Skin Assessment: I have examined the patient's skin and I agree with the wound assessment as performed by the wound care RN as outlined below:    Consultants:  None  Procedures:  None  Antimicrobials:  Anti-infectives (From admission, onward)    Start     Dose/Rate Route Frequency Ordered Stop   11/03/22 2200  cefTRIAXone (ROCEPHIN) 2 g in sodium chloride 0.9 % 100 mL IVPB  Status:  Discontinued        2 g 200 mL/hr over 30 Minutes Intravenous Every 24 hours 11/03/22 0737 11/03/22 1810   11/03/22 2200  cefTRIAXone (ROCEPHIN) 2 g in sodium chloride 0.9 % 100 mL IVPB        2 g 200 mL/hr over 30 Minutes Intravenous Every 24 hours 11/03/22 1810 11/10/22 2159   11/02/22 2230  cefTRIAXone (ROCEPHIN) 2 g in sodium chloride 0.9 % 100 mL IVPB        2 g 200 mL/hr over 30 Minutes Intravenous  Once 11/02/22 2224 11/03/22 0014         Subjective: Patient seen and examined.  He has no complaints.  Heard from Child psychotherapist that [employer] does not cover SNF and that they would have to go through patient's [employer] which will take extra time.  No chance of patient being discharged to SNF today.  Objective: Vitals:   11/06/22 2026 11/06/22 2049 11/07/22 0741 11/07/22 1108  BP: (!) 134/53 (!) 129/54 115/84 137/61  Pulse: 68 69 (!) 59 64  Resp: 16 18 18    Temp: 97.7 F (36.5 C)  98.2 F (36.8 C) 98 F (36.7 C)   TempSrc: Oral     SpO2: 99% 99% 99%   Weight:      Height:        Intake/Output Summary (Last 24 hours) at 11/07/2022 1116 Last data filed at 11/07/2022 1031 Gross per 24 hour  Intake 1220 ml  Output 1050 ml  Net 170 ml    Filed Weights   11/02/22 2028  Weight: 90.3 kg    Examination:  General exam: Appears calm and comfortable  Respiratory system: Clear to auscultation. Respiratory effort normal. Cardiovascular system: S1 & S2 heard, RRR. No JVD, murmurs, rubs, gallops or clicks. No pedal edema. Gastrointestinal system: Abdomen is nondistended, soft and nontender. No organomegaly or masses felt. Normal bowel sounds heard. Central nervous system: Alert and oriented. No focal neurological deficits. Extremities: Symmetric 5 x 5 power. Skin: No rashes, lesions or ulcers.  Psychiatry: Judgement and insight appear normal. Mood & affect appropriate.   Data Reviewed: I have personally reviewed following labs and imaging studies  CBC: Recent Labs  Lab 11/02/22 2034 11/03/22 0814 11/04/22 0632 11/05/22 0420  WBC 16.7* 16.8* 13.2* 9.7  HGB 11.2* 11.0* 9.7* 8.9*  HCT 33.3* 33.0* 29.5* 27.4*  MCV 90.5 89.2 88.6 89.5  PLT 261 235 195 185    Basic Metabolic Panel: Recent Labs  Lab 11/02/22 2034 11/03/22 0814 11/04/22 0632 11/05/22 0420 11/06/22 0820  NA 136 136 134* 136 136  K 4.5 3.9 4.1 3.9 4.0  CL 100 98 100 99 101  CO2 23 22 23 23 25   GLUCOSE 232* 231* 202* 222* 170*  BUN 24* 21 23 30* 25*  CREATININE 1.51* 1.40* 1.58* 1.75* 1.40*  CALCIUM 9.5 8.9 8.4* 8.5* 8.6*    GFR: Estimated Creatinine Clearance: 47.5 mL/min (A) (by C-G formula based on SCr of 1.4 mg/dL (H)). Liver Function Tests: Recent Labs  Lab 11/02/22 2139 11/03/22 0814  AST 25 34  ALT 48* 49*  ALKPHOS 76 70  BILITOT 0.7 0.6  PROT 7.1 6.7  ALBUMIN 3.7 3.4*    No results for input(s): "LIPASE", "AMYLASE" in the last 168 hours. No results for input(s):  "AMMONIA" in the last 168 hours. Coagulation Profile: Recent Labs  Lab 11/02/22 2139 11/03/22 0814  INR 1.1 1.2    Cardiac Enzymes: No results for input(s): "CKTOTAL", "CKMB", "CKMBINDEX", "TROPONINI" in the last 168 hours. BNP (last 3 results) No results for input(s): "PROBNP" in the last 8760 hours. HbA1C: No results for input(s): "HGBA1C" in the last 72 hours.  CBG: Recent Labs  Lab 11/06/22 0810 11/06/22 1231 11/06/22 1724 11/06/22 2115 11/07/22 0808  GLUCAP 153* 216* 173* 190* 163*    Lipid Profile: No results for input(s): "CHOL", "HDL", "LDLCALC", "TRIG", "CHOLHDL", "LDLDIRECT" in the last 72 hours. Thyroid Function Tests: No results for input(s): "TSH", "T4TOTAL", "FREET4", "T3FREE", "THYROIDAB" in the last 72 hours.  Anemia  Panel: No results for input(s): "VITAMINB12", "FOLATE", "FERRITIN", "TIBC", "IRON", "RETICCTPCT" in the last 72 hours.  Sepsis Labs: Recent Labs  Lab 11/02/22 2139 11/02/22 2322 11/03/22 0814  PROCALCITON  --   --  0.14  LATICACIDVEN 3.4* 2.2*  --      Recent Results (from the past 240 hour(s))  Blood Culture (routine x 2)     Status: None   Collection Time: 11/02/22  8:43 PM   Specimen: BLOOD  Result Value Ref Range Status   Specimen Description BLOOD RIGHT ANTECUBITAL  Final   Special Requests   Final    BOTTLES DRAWN AEROBIC AND ANAEROBIC Blood Culture results may not be optimal due to an excessive volume of blood received in culture bottles   Culture   Final    NO GROWTH 5 DAYS Performed at [facility], [address] [place], [place] [address]    Report Status 11/07/2022 FINAL  Final  Urine Culture     Status: Abnormal   Collection Time: 11/02/22  9:31 PM   Specimen: Urine, Clean Catch  Result Value Ref Range Status   Specimen Description URINE, CLEAN CATCH  Final   Special Requests   Final    NONE Performed at [facility], [address] [place], [place] [address]    Culture >=100,000 COLONIES/mL  ESCHERICHIA COLI (A)  Final   Report Status 11/04/2022 FINAL  Final   Organism ID, Bacteria ESCHERICHIA COLI (A)  Final      Susceptibility   Escherichia coli - MIC*    AMPICILLIN <=2 SENSITIVE Sensitive     CEFAZOLIN <=4 SENSITIVE Sensitive     CEFEPIME <=0.12 SENSITIVE Sensitive     CEFTRIAXONE <=0.25 SENSITIVE Sensitive     CIPROFLOXACIN <=0.25 SENSITIVE Sensitive     GENTAMICIN <=1 SENSITIVE Sensitive     IMIPENEM <=0.25 SENSITIVE Sensitive     NITROFURANTOIN <=16 SENSITIVE Sensitive     TRIMETH/SULFA <=20 SENSITIVE Sensitive     AMPICILLIN/SULBACTAM <=2 SENSITIVE Sensitive     PIP/TAZO <=4 SENSITIVE Sensitive     * >=100,000 COLONIES/mL ESCHERICHIA COLI  Blood Culture (routine x 2)     Status: None   Collection Time: 11/02/22  9:50 PM   Specimen: BLOOD LEFT HAND  Result Value Ref Range Status   Specimen Description BLOOD LEFT HAND  Final   Special Requests   Final    BOTTLES DRAWN AEROBIC AND ANAEROBIC Blood Culture results may not be optimal due to an excessive volume of blood received in culture bottles   Culture   Final    NO GROWTH 5 DAYS Performed at [facility], [address], [place], [place] [address]    Report Status 11/07/2022 FINAL  Final  SARS Coronavirus 2 by RT PCR (hospital order, performed in [facility] hospital lab) *cepheid single result test* Anterior Nasal Swab     Status: None   Collection Time: 11/02/22 10:51 PM   Specimen: Anterior Nasal Swab  Result Value Ref Range Status   SARS Coronavirus 2 by RT PCR NEGATIVE NEGATIVE Final    Comment: Performed at [facility], [address], [place], [place] [address]     Radiology Studies: No results found.  Scheduled Meds:  amLODipine  10 mg Oral Daily   carvedilol  6.25 mg Oral BID WC   clopidogrel  75 mg Oral Daily   cyanocobalamin  500 mcg Oral Daily   donepezil  10 mg Oral q AM  DULoxetine  20 mg Oral QPM   enoxaparin (LOVENOX) injection  40 mg Subcutaneous Daily   famotidine  20  mg Oral QHS   insulin aspart  0-20 Units Subcutaneous TID WC   insulin aspart  0-5 Units Subcutaneous QHS   insulin aspart  2 Units Subcutaneous TID WC   insulin glargine-yfgn  10 Units Subcutaneous QHS   linagliptin  5 mg Oral QPM   loratadine  10 mg Oral Daily   losartan  100 mg Oral Daily   memantine  5 mg Oral BID   pravastatin  40 mg Oral Daily   tamsulosin  0.4 mg Oral QPM   Continuous Infusions:  cefTRIAXone (ROCEPHIN)  IV Stopped (11/06/22 2318)     LOS: 5 days   Hughie Closs, MD [facility]  11/07/2022, 11:16 AM   *Please note that this is a verbal dictation therefore any spelling or grammatical errors are due to the "Dragon Medical One" system interpretation.  Please page via Amion and do not message via secure chat for urgent patient care matters. Secure chat can be used for non urgent patient care matters.  How to contact the [facility] Attending or Consulting provider 7A - 7P or covering provider during after hours 7P -7A, for this patient?  Check the care team in [facility] and look for a) attending/consulting TRH provider listed and b) the [facility] team listed. Page or secure chat 7A-7P. Log into [email or web address] and use [place]'s universal password to access. If you do not have the password, please contact the hospital operator. Locate the [facility] provider you are looking for under [facility] and page to a number that you can be directly reached. If you still have difficulty reaching the provider, please page the [facility] (Director on Call) for the Hospitalists listed on amion for assistance.

## 2022-11-07 NOTE — Progress Notes (Signed)
Patient refused Cpap for the night

## 2022-11-08 DIAGNOSIS — A419 Sepsis, unspecified organism: Secondary | ICD-10-CM | POA: Diagnosis not present

## 2022-11-08 DIAGNOSIS — N390 Urinary tract infection, site not specified: Secondary | ICD-10-CM | POA: Diagnosis not present

## 2022-11-08 LAB — CBC WITH DIFFERENTIAL/PLATELET
Abs Immature Granulocytes: 0.6 10*3/uL — ABNORMAL HIGH (ref 0.00–0.07)
Basophils Absolute: 0.1 10*3/uL (ref 0.0–0.1)
Basophils Relative: 1 %
Eosinophils Absolute: 0.4 10*3/uL (ref 0.0–0.5)
Eosinophils Relative: 4 %
HCT: 31.8 % — ABNORMAL LOW (ref 39.0–52.0)
Hemoglobin: 10.3 g/dL — ABNORMAL LOW (ref 13.0–17.0)
Immature Granulocytes: 6 %
Lymphocytes Relative: 15 %
Lymphs Abs: 1.6 10*3/uL (ref 0.7–4.0)
MCH: 28.1 pg (ref 26.0–34.0)
MCHC: 32.4 g/dL (ref 30.0–36.0)
MCV: 86.9 fL (ref 80.0–100.0)
Monocytes Absolute: 1.1 10*3/uL — ABNORMAL HIGH (ref 0.1–1.0)
Monocytes Relative: 10 %
Neutro Abs: 7 10*3/uL (ref 1.7–7.7)
Neutrophils Relative %: 64 %
Platelets: 336 10*3/uL (ref 150–400)
RBC: 3.66 MIL/uL — ABNORMAL LOW (ref 4.22–5.81)
RDW: 13.5 % (ref 11.5–15.5)
WBC: 10.7 10*3/uL — ABNORMAL HIGH (ref 4.0–10.5)
nRBC: 0 % (ref 0.0–0.2)

## 2022-11-08 LAB — COMPREHENSIVE METABOLIC PANEL
ALT: 45 U/L — ABNORMAL HIGH (ref 0–44)
AST: 25 U/L (ref 15–41)
Albumin: 2.7 g/dL — ABNORMAL LOW (ref 3.5–5.0)
Alkaline Phosphatase: 120 U/L (ref 38–126)
Anion gap: 10 (ref 5–15)
BUN: 24 mg/dL — ABNORMAL HIGH (ref 8–23)
CO2: 23 mmol/L (ref 22–32)
Calcium: 8.8 mg/dL — ABNORMAL LOW (ref 8.9–10.3)
Chloride: 102 mmol/L (ref 98–111)
Creatinine, Ser: 1.5 mg/dL — ABNORMAL HIGH (ref 0.61–1.24)
GFR, Estimated: 47 mL/min — ABNORMAL LOW (ref >60)
Glucose, Bld: 153 mg/dL — ABNORMAL HIGH (ref 70–99)
Potassium: 4 mmol/L (ref 3.5–5.1)
Sodium: 135 mmol/L (ref 135–145)
Total Bilirubin: 0.4 mg/dL (ref 0.3–1.2)
Total Protein: 6.2 g/dL — ABNORMAL LOW (ref 6.5–8.1)

## 2022-11-08 LAB — GLUCOSE, CAPILLARY
Glucose-Capillary: 156 mg/dL — ABNORMAL HIGH (ref 70–99)
Glucose-Capillary: 177 mg/dL — ABNORMAL HIGH (ref 70–99)
Glucose-Capillary: 219 mg/dL — ABNORMAL HIGH (ref 70–99)
Glucose-Capillary: 163 mg/dL — ABNORMAL HIGH (ref 70–99)

## 2022-11-08 MED ORDER — INSULIN ASPART 100 UNIT/ML IJ SOLN
3.0000 [IU] | Freq: Three times a day (TID) | INTRAMUSCULAR | Status: DC
  Administered 2022-11-08 – 2022-11-12 (×12): 3 [IU] via SUBCUTANEOUS

## 2022-11-08 MED ORDER — DIPHENHYDRAMINE-ZINC ACETATE 2-0.1 % EX CREA
TOPICAL_CREAM | Freq: Three times a day (TID) | CUTANEOUS | Status: DC | PRN
  Filled 2022-11-08: qty 28

## 2022-11-08 NOTE — TOC Progression Note (Addendum)
Transition of Care [facility]) - Progression Note    Patient Details  Name: [name] MRN: [id number] Date of Birth: [birth year]  [facility] CM/SW Contact  Chatara Lucente A [place], [place] Phone Number: 11/08/2022, 4:32 PM  Clinical Narrative:    CSW received contact from [place], [place],, Child psychotherapist, that pt was approved for short term rehab, 30 day contract with [facility] facility. She said that CSW needed to follow up with Tomasa Hose [phone] or [email or web address]) regarding pt's facility choice and generate the authorization finalization for SNF as well as ambulance transportation. CSW left contact information and voicemail with Malachi Paradise as she was unable to be reached. Pt and pt's family updated with information.   Pt has bed offer and room number and number for report from facility, [facility] (Room 111, [phone]. CSW contacted them to updated on discharge date early next week.   TOC will continue to follow.    CSW received contact from [facility], [phone] ext [phone]  stating that their office had received documentation for review regarding pt's SNF request. She stated their office would follow up with CSW tomorrow regarding decision on facility recommendation. Pt's family informed of information.   TOC will continue to follow.   Expected Discharge Plan: Skilled Nursing Facility Barriers to Discharge: Continued Medical Work up  Expected Discharge Plan and Services   Discharge Planning Services: CM Consult Post Acute Care Choice: Skilled Nursing Facility Living arrangements for the past 2 months: Single Family Home Expected Discharge Date: 11/07/22                                     Social Determinants of Health (SDOH) Interventions SDOH Screenings   Food Insecurity: No Food Insecurity (11/03/2022)  Housing: Low Risk  (11/03/2022)  Transportation Needs: No Transportation Needs (11/03/2022)  Utilities: Not At Risk (11/03/2022)  Tobacco  Use: Medium Risk (11/03/2022)    Readmission Risk Interventions    11/06/2022    3:32 PM 11/05/2022   12:47 PM  Readmission Risk Prevention Plan  Transportation Screening Complete Complete  PCP or Specialist Appt within 5-7 Days Complete Complete  Home Care Screening Complete Complete  Medication Review (RN CM) Complete Complete

## 2022-11-08 NOTE — Progress Notes (Signed)
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  Assessment & Plan:   Principal Problem:   Sepsis secondary to UTI [facility]) Active Problems:   CAD (coronary artery disease)   Type 2 diabetes mellitus (HCC)   Status post carotid endarterectomy   AKI (acute kidney injury) (HCC)   Hyperlipidemia   BPH (benign prostatic hyperplasia)   Dementia without behavioral disturbance (HCC)  Sepsis secondary to UTI POA: Met sepsis criteria based on fever, leukocytosis and tachycardia.  UA consistent with UTI, urine culture growing E. coli which is pansensitive.  Last temperature spike was of 102.4 was on 11/03/2022.  He is afebrile since then.  Continue Rocephin for 7 days.  Generalized weakness: Secondary to sepsis.  Seen by PT OT and they recommend SNF.  Patient and wife in agreement.  TOC consulted.  Pending placement.  CKD stage IIIa: At baseline.  Type 2 diabetes mellitus: Hemoglobin A1c 7.6.  Takes glipizide, Tradjenta and metformin at home.  Currently on Semglee 10 units, Tradjenta and SSI.  Blood sugar slightly elevated, will increase Semglee to 15 units.  Hyperlipidemia: Continue Pravachol.  Dementia without behavioral disturbances: Continue Namenda, Aricept and Cymbalta.  History of BPH: Continue Flomax.  PAD: Continue pravastatin and Plavix.  Essential hypertension: Controlled.  Continue Norvasc, Coreg and Cozaar.  DVT prophylaxis: enoxaparin (LOVENOX) injection 40 mg Start: 11/03/22 1000   Code Status: Full Code  Family Communication:  None present at bedside.   Plan of care discussed with patient in length and he/she verbalized understanding and agreed with it.  Also discussed with his wife over the phone.  Status is: Inpatient Remains inpatient appropriate because: Medically stable, pending placement to SNF.  TOC on board.   Estimated body mass index is 29.39 kg/m as calculated from the following:   Height as of this encounter: 5\' 9"  (1.753 m).   Weight as of this encounter: 90.3 kg.    Nutritional Assessment: Body mass index is 29.39 kg/m.Marland Kitchen Seen by dietician.  I agree with the assessment and plan as outlined below: Nutrition Status:        . Skin Assessment: I have examined the patient's skin and I agree with the wound assessment as performed by the wound care RN as outlined below:    Consultants:  None  Procedures:  None  Antimicrobials:  Anti-infectives (From admission, onward)    Start     Dose/Rate Route Frequency Ordered Stop   11/03/22 2200  cefTRIAXone (ROCEPHIN) 2 g in sodium chloride 0.9 % 100 mL IVPB  Status:  Discontinued        2 g 200 mL/hr over 30 Minutes Intravenous Every 24 hours 11/03/22 0737 11/03/22 1810   11/03/22 2200  cefTRIAXone (ROCEPHIN) 2 g in sodium chloride 0.9 % 100 mL IVPB        2 g 200 mL/hr over 30 Minutes Intravenous Every 24 hours 11/03/22 1810 11/10/22 2159   11/02/22 2230  cefTRIAXone (ROCEPHIN) 2 g in sodium chloride 0.9 % 100 mL IVPB        2 g 200 mL/hr over 30 Minutes Intravenous  Once 11/02/22 2224 11/03/22 0014         Subjective: Seen and examined, no complaints.  Objective: Vitals:   11/07/22 2005 11/08/22 0204 11/08/22 0206 11/08/22 0726  BP: 138/65 (!) 141/67 (!) 141/67 133/67  Pulse: 64 64 64 63  Resp: 18  18 18   Temp: 97.8 F (36.6 C)  97.8 F (36.6 C) 98 F (36.7 C)  TempSrc: Oral     SpO2: 100% 99% 99% 96%  Weight:      Height:        Intake/Output Summary (Last 24 hours) at 11/08/2022 1107 Last data filed at 11/08/2022 0818 Gross per 24 hour  Intake  600 ml  Output 2800 ml  Net -2200 ml    Filed Weights   11/02/22 2028  Weight: 90.3 kg    Examination:  General exam: Appears calm and comfortable  Respiratory system: Clear to auscultation. Respiratory effort normal. Cardiovascular system: S1 & S2 heard, RRR. No JVD, murmurs, rubs, gallops or clicks. No pedal edema. Gastrointestinal system: Abdomen is nondistended, soft and nontender. No organomegaly or masses felt. Normal bowel sounds heard. Central nervous system: Alert and oriented. No focal neurological deficits. Extremities: Symmetric 5 x 5 power. Skin: No rashes, lesions or ulcers.  Psychiatry: Judgement and insight appear normal. Mood & affect appropriate.   Data Reviewed: I have personally reviewed following labs and imaging studies  CBC: Recent Labs  Lab 11/02/22 2034 11/03/22 0814 11/04/22 0632 11/05/22 0420 11/08/22 0806  WBC 16.7* 16.8* 13.2* 9.7 10.7*  NEUTROABS  --   --   --   --  7.0  HGB 11.2* 11.0* 9.7* 8.9* 10.3*  HCT 33.3* 33.0* 29.5* 27.4* 31.8*  MCV 90.5 89.2 88.6 89.5 86.9  PLT 261 235 195 185 336    Basic Metabolic Panel: Recent Labs  Lab 11/03/22 0814 11/04/22 0632 11/05/22 0420 11/06/22 0820 11/08/22 0806  NA 136 134* 136 136 135  K 3.9 4.1 3.9 4.0 4.0  CL 98 100 99 101 102  CO2 22 23 23 25 23   GLUCOSE 231* 202* 222* 170* 153*  BUN 21 23 30* 25* 24*  CREATININE 1.40* 1.58* 1.75* 1.40* 1.50*  CALCIUM 8.9 8.4* 8.5* 8.6* 8.8*    GFR: Estimated Creatinine Clearance: 44.3 mL/min (A) (by C-G formula based on SCr of 1.5 mg/dL (H)). Liver Function Tests: Recent Labs  Lab 11/02/22 2139 11/03/22 0814 11/08/22 0806  AST 25 34 25  ALT 48* 49* 45*  ALKPHOS 76 70 120  BILITOT 0.7 0.6 0.4  PROT 7.1 6.7 6.2*  ALBUMIN 3.7 3.4* 2.7*    No results for input(s): "LIPASE", "AMYLASE" in the last 168 hours. No results for input(s): "AMMONIA" in the last 168 hours. Coagulation Profile: Recent Labs  Lab 11/02/22 2139 11/03/22 0814  INR  1.1 1.2    Cardiac Enzymes: No results for input(s): "CKTOTAL", "CKMB", "CKMBINDEX", "TROPONINI" in the last 168 hours. BNP (last 3 results) No results for input(s): "PROBNP" in the last 8760 hours. HbA1C: No results for input(s): "HGBA1C" in the last 72 hours.  CBG: Recent Labs  Lab 11/07/22 0808 11/07/22 1147 11/07/22 1612 11/07/22 2007 11/08/22 0728  GLUCAP 163* 271* 164* 228* 156*    Lipid Profile: No results for input(s): "CHOL", "HDL", "LDLCALC", "TRIG", "CHOLHDL", "LDLDIRECT" in the last 72 hours. Thyroid Function Tests: No results for input(s): "TSH", "T4TOTAL", "FREET4", "T3FREE", "THYROIDAB" in the last 72 hours.  Anemia Panel: No results for input(s): "VITAMINB12", "FOLATE", "FERRITIN", "TIBC", "IRON", "RETICCTPCT" in the  last 72 hours.  Sepsis Labs: Recent Labs  Lab 11/02/22 2139 11/02/22 2322 11/03/22 0814  PROCALCITON  --   --  0.14  LATICACIDVEN 3.4* 2.2*  --      Recent Results (from the past 240 hour(s))  Blood Culture (routine x 2)     Status: None   Collection Time: 11/02/22  8:43 PM   Specimen: BLOOD  Result Value Ref Range Status   Specimen Description BLOOD RIGHT ANTECUBITAL  Final   Special Requests   Final    BOTTLES DRAWN AEROBIC AND ANAEROBIC Blood Culture results may not be optimal due to an excessive volume of blood received in culture bottles   Culture   Final    NO GROWTH 5 DAYS Performed at [facility], [address] [place], [place] [address]    Report Status 11/07/2022 FINAL  Final  Urine Culture     Status: Abnormal   Collection Time: 11/02/22  9:31 PM   Specimen: Urine, Clean Catch  Result Value Ref Range Status   Specimen Description URINE, CLEAN CATCH  Final   Special Requests   Final    NONE Performed at [facility], [address] [place], [place] [address]    Culture >=100,000 COLONIES/mL ESCHERICHIA COLI (A)  Final   Report Status 11/04/2022 FINAL  Final   Organism ID, Bacteria ESCHERICHIA COLI  (A)  Final      Susceptibility   Escherichia coli - MIC*    AMPICILLIN <=2 SENSITIVE Sensitive     CEFAZOLIN <=4 SENSITIVE Sensitive     CEFEPIME <=0.12 SENSITIVE Sensitive     CEFTRIAXONE <=0.25 SENSITIVE Sensitive     CIPROFLOXACIN <=0.25 SENSITIVE Sensitive     GENTAMICIN <=1 SENSITIVE Sensitive     IMIPENEM <=0.25 SENSITIVE Sensitive     NITROFURANTOIN <=16 SENSITIVE Sensitive     TRIMETH/SULFA <=20 SENSITIVE Sensitive     AMPICILLIN/SULBACTAM <=2 SENSITIVE Sensitive     PIP/TAZO <=4 SENSITIVE Sensitive     * >=100,000 COLONIES/mL ESCHERICHIA COLI  Blood Culture (routine x 2)     Status: None   Collection Time: 11/02/22  9:50 PM   Specimen: BLOOD LEFT HAND  Result Value Ref Range Status   Specimen Description BLOOD LEFT HAND  Final   Special Requests   Final    BOTTLES DRAWN AEROBIC AND ANAEROBIC Blood Culture results may not be optimal due to an excessive volume of blood received in culture bottles   Culture   Final    NO GROWTH 5 DAYS Performed at [facility], [address], [place], [place] [address]    Report Status 11/07/2022 FINAL  Final  SARS Coronavirus 2 by RT PCR (hospital order, performed in [facility] hospital lab) *cepheid single result test* Anterior Nasal Swab     Status: None   Collection Time: 11/02/22 10:51 PM   Specimen: Anterior Nasal Swab  Result Value Ref Range Status   SARS Coronavirus 2 by RT PCR NEGATIVE NEGATIVE Final    Comment: Performed at [facility], [address], [place], [place] [address]     Radiology Studies: No results found.  Scheduled Meds:  amLODipine  10 mg Oral Daily   carvedilol  6.25 mg Oral BID WC   clopidogrel  75 mg Oral Daily   cyanocobalamin  500 mcg Oral Daily   donepezil  10 mg Oral q AM   DULoxetine  20 mg Oral QPM   enoxaparin (LOVENOX) injection  40 mg Subcutaneous Daily   famotidine  20 mg Oral QHS   insulin aspart  0-20 Units Subcutaneous TID WC   insulin aspart  0-5 Units Subcutaneous QHS    insulin aspart  2 Units Subcutaneous TID WC   insulin aspart  3 Units Subcutaneous TID WC   insulin glargine-yfgn  15 Units Subcutaneous QHS   linagliptin  5 mg Oral QPM   loratadine  10 mg Oral Daily   losartan  100 mg Oral Daily   memantine  5 mg Oral BID   pravastatin  40 mg Oral Daily   tamsulosin  0.4 mg Oral QPM   Continuous Infusions:  cefTRIAXone (ROCEPHIN)  IV 2 g (11/07/22 2224)     LOS: 6 days   Hughie Closs, MD [facility]  11/08/2022, 11:07 AM   *Please note that this is a verbal dictation therefore any spelling or grammatical errors are due to the "Dragon [employer] One" system interpretation.  Please page via Amion and do not message via secure chat for urgent patient care matters. Secure chat can be used for non urgent patient care matters.  How to contact the [facility] Attending or Consulting provider 7A - 7P or covering provider during after hours 7P -7A, for this patient?  Check the care team in [facility] and look for a) attending/consulting [facility] provider listed and b) the [facility] team listed. Page or secure chat 7A-7P. Log into [email or web address] and use [facility]'s universal password to access. If you do not have the password, please contact the hospital operator. Locate the [facility] provider you are looking for under [facility] and page to a number that you can be directly reached. If you still have difficulty reaching the provider, please page the [facility] (Director on Call) for the Hospitalists listed on amion for assistance.

## 2022-11-08 NOTE — Plan of Care (Signed)
RN was notified by pt wife pt had stitches on left shoulder that is supposed to be removed today. Stiches were from area of skin cancer that was removed previously before pt hospitalization. Per pt wife, wife was supposed to take pt back to clinic today to have them removed but pt still here in hospital. Per pt wife request RN reached out to Dr. Jacqulyn Bath MD, to see if it is okay for RN to remove those stiches for pt, per MD okay to remove as long as wife says they need to be removed today. RN was able to remove 9 stitches from pt Left shoulder incision. Incision clean and dry, RN placed dry dressing over area.

## 2022-11-08 NOTE — Progress Notes (Signed)
Physical Therapy Treatment Patient Details Name: [name] MRN: [id number] DOB: Oct 10, 1945 Today's Date: 11/08/2022   History of Present Illness 77 year old admitted due to weakness and confusion with UTI.  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    PT Comments    Pt was seen for mobility on RW and to strengthen and stretch BLE's with focus on LLE.  Pt is demonstrating a certain amount of inattention to the task, noted especially with doing exercises to BLE's.  Repetitive cues for all tasks were used, and will recommend rehab stay as planned.  Contacted his wife to update at end of session as requested.  Follow for goals of acute PT.   Recommendations for follow up therapy are one component of a multi-disciplinary discharge planning process, led by the attending physician.  Recommendations may be updated based on patient status, additional functional criteria and insurance authorization.  Follow Up Recommendations  Can patient physically be transported by private vehicle: No    Assistance Recommended at Discharge Frequent or constant Supervision/Assistance  Patient can return home with the following A little help with walking and/or transfers;A little help with bathing/dressing/bathroom;Assistance with cooking/housework;Assist for transportation;Help with stairs or ramp for entrance   Equipment Recommendations  None recommended by PT    Recommendations for Other Services       Precautions / Restrictions Precautions Precautions: Fall Precaution Comments: inattention to the task often Restrictions Weight Bearing Restrictions: No     Mobility  Bed Mobility Overal bed mobility: Needs Assistance             General bed mobility comments: up to chair when PT arrives     Transfers Overall transfer level: Needs assistance Equipment used: Rolling walker (2 wheels) Transfers: Sit to/from Stand Sit to Stand: Min guard, Min assist           General transfer comment: minor support due to posterior LOB    Ambulation/Gait Ambulation/Gait assistance: Min guard, Min assist Gait Distance (Feet): 80 Feet Assistive device: Rolling walker (2 wheels) Gait Pattern/deviations: Step-through pattern, Decreased stride length, Wide base of support, Drifts right/left, Decreased weight shift to left Gait velocity: reduced Gait velocity interpretation: <1.31 ft/sec, indicative of household ambulator   General Gait Details: tends to list back and L but is also dealing with strength and ROM changes on LLE   Stairs             Wheelchair Mobility    Modified Rankin (Stroke Patients Only)       Balance Overall balance assessment: Needs assistance Sitting-balance support: Feet supported Sitting balance-Leahy Scale: Fair     Standing balance support: Bilateral upper extremity supported, During functional activity Standing balance-Leahy Scale: Poor                              Cognition Arousal/Alertness: Awake/alert Behavior During Therapy:  (distracted) Overall Cognitive Status: No family/caregiver present to determine baseline cognitive functioning Area of Impairment: Attention, Following commands, Awareness, Problem solving                   Current Attention Level: Selective Memory: Decreased recall of precautions, Decreased short-term memory Following Commands: Follows one step commands with increased time Safety/Judgement: Decreased awareness of safety, Decreased awareness of deficits Awareness:  Intellectual Problem Solving: Slow processing General Comments: reinstructed for mobility on RW, requires direct intervention for safety due to focus on direction and posterior LOB        Exercises General Exercises - Lower  Extremity Ankle Circles/Pumps: AROM, AAROM, 5 reps Quad Sets: AROM, 10 reps Gluteal Sets: AROM, 10 reps Heel Slides: AROM, AAROM, 10 reps Hip ABduction/ADduction: AAROM, AROM, 10 reps    General Comments General comments (skin integrity, edema, etc.): pt is up to walk with help and requires assist and direction, unsafe to maneuver alone with tendency to posteriorly lean      Pertinent Vitals/Pain Pain Assessment Pain Assessment: Faces Faces Pain Scale: No hurt    Home Living                          Prior Function            PT Goals (current goals can now be found in the care plan section) Acute Rehab PT Goals Patient Stated Goal: to get home Progress towards PT goals: Progressing toward goals    Frequency    Min 3X/week      PT Plan Current plan remains appropriate    Co-evaluation              AM-PAC PT "6 Clicks" Mobility   Outcome Measure  Help needed turning from your back to your side while in a flat bed without using bedrails?: A Little Help needed moving from lying on your back to sitting on the side of a flat bed without using bedrails?: A Little Help needed moving to and from a bed to a chair (including a wheelchair)?: A Little Help needed standing up from a chair using your arms (e.g., wheelchair or bedside chair)?: A Little Help needed to walk in hospital room?: A Little Help needed climbing 3-5 steps with a railing? : A Lot 6 Click Score: 17    End of Session Equipment Utilized During Treatment: Gait belt Activity Tolerance: Patient limited by fatigue Patient left: in chair;with call bell/phone within reach;with chair alarm set Nurse Communication: Mobility status PT Visit Diagnosis: Unsteadiness on feet (R26.81);Muscle weakness (generalized) (M62.81)     Time: 1610-9604 PT Time Calculation (min) (ACUTE ONLY): 33 min  Charges:  $Gait Training: 8-22 mins $Therapeutic Exercise: 8-22 mins       Ivar Drape 11/08/2022, 12:54  PM  Samul Dada, PT PhD Acute Rehab Dept. Number: [facility] [id number] and [facility] [phone]

## 2022-11-08 NOTE — Inpatient Diabetes Management (Signed)
Inpatient Diabetes Program Recommendations  AACE/ADA: New Consensus Statement on Inpatient Glycemic Control (2015)  Target Ranges:  Prepandial:   less than 140 mg/dL      Peak postprandial:   less than 180 mg/dL (1-2 hours)      Critically ill patients:  140 - 180 mg/dL   Lab Results  Component Value Date   GLUCAP 156 (H) 11/08/2022   HGBA1C 7.6 (H) 11/03/2022    Review of Glycemic Control  Latest Reference Range & Units 11/07/22 08:08 11/07/22 11:47 11/07/22 16:12 11/07/22 20:07 11/08/22 07:28  Glucose-Capillary 70 - 99 mg/dL 409 (H) 811 (H) 914 (H) 228 (H) 156 (H)   Diabetes history: DM Outpatient Diabetes medications:  Glucotrol 2.5 mg bid Metformin 1000 mg daily Current orders for Inpatient glycemic control:  Novolog 0-20 units tid with meals and HS Novolog 2 units tid with meals Semglee 15 units q HS Tradjenta 5 mg daily  Inpatient Diabetes Program Recommendations:    Consider increasing Novolog meal coverage to 3 units tid with meals (hold if patient eats less than 50% or NPO).   Thanks,  Beryl Meager, RN, BC-ADM Inpatient Diabetes Coordinator Pager [phone]  (8a-5p)

## 2022-11-08 NOTE — Plan of Care (Signed)

## 2022-11-09 LAB — CREATININE, SERUM
Creatinine, Ser: 1.6 mg/dL — ABNORMAL HIGH (ref 0.61–1.24)
GFR, Estimated: 44 mL/min — ABNORMAL LOW (ref >60)

## 2022-11-09 LAB — GLUCOSE, CAPILLARY
Glucose-Capillary: 209 mg/dL — ABNORMAL HIGH (ref 70–99)
Glucose-Capillary: 221 mg/dL — ABNORMAL HIGH (ref 70–99)
Glucose-Capillary: 184 mg/dL — ABNORMAL HIGH (ref 70–99)
Glucose-Capillary: 202 mg/dL — ABNORMAL HIGH (ref 70–99)

## 2022-11-09 MED ORDER — INSULIN GLARGINE-YFGN 100 UNIT/ML ~~LOC~~ SOLN
20.0000 [IU] | Freq: Every day | SUBCUTANEOUS | Status: DC
  Administered 2022-11-09 – 2022-11-11 (×3): 20 [IU] via SUBCUTANEOUS
  Filled 2022-11-09 (×4): qty 0.2

## 2022-11-09 NOTE — Discharge Summary (Addendum)
Physician Discharge Summary  DALMER DOLINSKI ZOX:[id number] DOB: 1946/03/26 DOA: 11/02/2022  PCP: [facility]  Admit date: 11/02/2022 Discharge date: 11/12/2022 30 Day Unplanned Readmission Risk Score    Flowsheet Row ED to Hosp-Admission (Current) from 11/02/2022 in [place] [facility] Unit  30 Day Unplanned Readmission Risk Score (%) 21.99 Filed at 11/09/2022 0400       This score is the patient's risk of an unplanned readmission within 30 days of being discharged (0 -100%). The score is based on dignosis, age, lab data, medications, orders, and past utilization.   Low:  0-14.9   Medium: 15-21.9   High: 22-29.9   Extreme: 30 and above          Admitted From: Home Disposition: SNF  Recommendations for Outpatient Follow-up:  Follow up with PCP in 1-2 weeks Please obtain BMP/CBC in one week Please follow up with your PCP on the following pending results: Unresulted Labs (From admission, onward)     Start     Ordered   11/09/22 0500  Creatinine, serum  (enoxaparin (LOVENOX)    CrCl >/= 30 ml/min)  Weekly,   R     Comments: while on enoxaparin therapy    11/03/22 0737              Home Health: None Equipment/Devices: None  Discharge Condition: Stable CODE STATUS: Full code Diet recommendation: Cardiac  Subjective: Seen and examined.  He has no complaints since last few days.  He has been waiting for placement to SNF.  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  Details below.   Sepsis secondary to UTI POA: Met sepsis criteria based on fever, leukocytosis and tachycardia.  UA consistent with UTI, urine culture growing E. coli which is  pansensitive.  Last temperature spike was of 102.4 was on 11/03/2022.  He is afebrile since then.  Received Rocephin for 7 days.   Generalized weakness:  Severe sepsis secondary to UTI: UTI secondary to E.Coli: - Seen by PT OT and they recommend SNF.  Patient and wife in agreement.  TOC consulted.  Pending placement.   CKD stage IIIa: At baseline.   Type 2 diabetes mellitus: Hemoglobin A1c 7.6.  Takes glipizide, Tradjenta and metformin at home.  Currently on Semglee 15 units units, Tradjenta and SSI.  Will discharge on home medications.  Follow-up with PCP for further modifications of medications as needed.   Hyperlipidemia: Continue Pravachol.   Dementia without behavioral disturbances: Continue Namenda, Aricept and Cymbalta.   History of BPH: Continue Flomax.   PAD: Continue pravastatin and Plavix.   Essential hypertension: Controlled.  Continue Norvasc, Coreg and Cozaar.  Discharge plan was discussed with patient and/or family member and they verbalized understanding and agreed with it.  Discharge Diagnoses:  Principal Problem:   Sepsis secondary to UTI [facility]) Active Problems:   CAD (coronary artery disease)   Type 2 diabetes mellitus (HCC)   Status post carotid endarterectomy   AKI (acute kidney injury) (HCC)   Hyperlipidemia   BPH (benign prostatic hyperplasia)   Dementia without behavioral disturbance [facility])    Discharge Instructions  Discharge Instructions     Diet - low sodium heart healthy   Complete  by: As directed    Increase activity slowly   Complete by: As directed       Allergies as of 11/12/2022       Reactions   Contrast Media [iodinated Contrast Media] Nausea And Vomiting   Quinine Derivatives Other (See Comments)   Pt does not know        Medication List     STOP taking these medications    cephALEXin 500 MG capsule Commonly known as: KEFLEX   ondansetron 4 MG tablet Commonly known as: ZOFRAN   psyllium 95 % Pack Commonly known as:  HYDROCIL/METAMUCIL       TAKE these medications    acetaminophen 500 MG tablet Commonly known as: TYLENOL Take 2 tablets (1,000 mg total) by mouth every 6 (six) hours as needed.   Alogliptin Benzoate 25 MG Tabs Take 12.5 mg by mouth every evening.   amLODipine 10 MG tablet Commonly known as: NORVASC Take 1 tablet (10 mg total) by mouth daily.   aspirin EC 81 MG tablet Take 81 mg by mouth daily. Swallow whole.   carvedilol 6.25 MG tablet Commonly known as: COREG Take 6.25 mg by mouth 2 (two) times daily with a meal.   cetirizine 10 MG tablet Commonly known as: ZYRTEC Take 10 mg by mouth daily.   cholecalciferol 25 MCG (1000 UNIT) tablet Commonly known as: VITAMIN D3 Take 2,000 Units by mouth every 14 (fourteen) days.   clopidogrel 75 MG tablet Commonly known as: PLAVIX Take 1 tablet (75 mg total) by mouth daily.   donepezil 10 MG tablet Commonly known as: ARICEPT Take 10 mg by mouth in the morning.   DULoxetine 20 MG capsule Commonly known as: CYMBALTA Take 20 mg by mouth every evening.   famotidine 20 MG tablet Commonly known as: PEPCID Take 20 mg by mouth at bedtime.   Fish Oil 1000 MG Caps Take 1 capsule by mouth daily.   glipiZIDE 5 MG tablet Commonly known as: GLUCOTROL Take 2.5 mg by mouth 2 (two) times daily before a meal.   LACTOBACILLUS PROBIOTIC PO Take 2 tablets by mouth in the morning and at bedtime.   losartan 100 MG tablet Commonly known as: COZAAR Take 100 mg by mouth in the morning. What changed: Another medication with the same name was removed. Continue taking this medication, and follow the directions you see here.   memantine 5 MG tablet Commonly known as: NAMENDA Take 5 mg by mouth 2 (two) times daily.   metFORMIN 1000 MG tablet Commonly known as: GLUCOPHAGE Take 1,000 mg by mouth daily.   multivitamin with minerals Tabs tablet Take 1 tablet by mouth daily.   nitroGLYCERIN 0.4 MG SL tablet Commonly known as:  NITROSTAT Place 0.4 mg under the tongue every 5 (five) minutes as needed for chest pain.   pravastatin 40 MG tablet Commonly known as: PRAVACHOL Take 40 mg by mouth in the morning.   tamsulosin 0.4 MG Caps capsule Commonly known as: FLOMAX Take 0.4 mg by mouth every evening.   vitamin B-12 500 MCG tablet Commonly known as: CYANOCOBALAMIN Take 500 mcg by mouth daily.        Follow-up Information     [name] up in 1 week(s).   Contact information: [address] [facility] [place] [address] [phone]                Allergies  Allergen Reactions   Contrast Media [Iodinated Contrast Media] Nausea And Vomiting   Quinine  Derivatives Other (See Comments)    Pt does not know     Consultations: None   Procedures/Studies: CT HEAD WO CONTRAST ( )  Result Date: 11/04/2022 CLINICAL DATA:  77 year old male with altered mental status. Recent question of sepsis. EXAM: CT HEAD WITHOUT CONTRAST TECHNIQUE: Contiguous axial images were obtained from the base of the skull through the vertex without intravenous contrast. RADIATION DOSE REDUCTION: This exam was performed according to the departmental dose-optimization program which includes automated exposure control, adjustment of the mA and/or kV according to patient size and/or use of iterative reconstruction technique. COMPARISON:  Brain MRI 10/07/2022. FINDINGS: Brain: Questionable disproportionate atrophy of the frontal lobes, especially the right (coronal image 37), volume unchanged from MRI last month. No superimposed No midline shift, ventriculomegaly, mass effect, evidence of mass lesion, intracranial hemorrhage or evidence of cortically based acute infarction. Gray-white differentiation remains largely normal for age, mild nonspecific white matter changes. Vascular: No suspicious intracranial vascular hyperdensity. Calcified atherosclerosis at the skull base. Skull: Negative.  Sinuses/Orbits: Visualized paranasal sinuses and mastoids are clear. Other: Negative orbit and scalp soft tissues, postoperative changes to both globes. IMPRESSION: 1. No acute intracranial abnormality. 2. 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 Electronically Signed   By: Odessa Fleming M.D.   On: 11/04/2022 12:29   DG Chest Port 1 View  Result Date: 11/02/2022 CLINICAL DATA:  Question of sepsis to evaluate for abnormality. Weakness in the arms and legs. EXAM: PORTABLE CHEST 1 VIEW COMPARISON:  10/07/2022 FINDINGS: Shallow inspiration. Heart size and pulmonary vascularity are normal for technique. Lungs are clear. No pleural effusions. No pneumothorax. Mediastinal contours appear intact. Calcification of the aorta. IMPRESSION: Shallow inspiration.  No evidence of active pulmonary disease. Electronically Signed   By: Burman Nieves M.D.   On: 11/02/2022 21:58     Discharge Exam: Vitals:   11/12/22 0817 11/12/22 0827  BP: 131/64   Pulse: (!) 49 67  Resp: 17   Temp: (!) 97.5 F (36.4 C)   SpO2: 96%    Vitals:   11/12/22 0414 11/12/22 0416 11/12/22 0817 11/12/22 0827  BP: (!) 144/108 (!) 144/58 131/64   Pulse: 64 66 (!) 49 67  Resp: 16  17   Temp: 98 F (36.7 C)  (!) 97.5 F (36.4 C)   TempSrc: Oral     SpO2: 95%  96%   Weight:      Height:        General: Pt is alert, awake, not in acute distress Cardiovascular: RRR, S1/S2 +, no rubs, no gallops Respiratory: CTA bilaterally, no wheezing, no rhonchi Abdominal: Soft, NT, ND, bowel sounds + Extremities: no edema, no cyanosis    The results of significant diagnostics from this hospitalization (including imaging, microbiology, ancillary and laboratory) are listed below for reference.     Microbiology: Recent Results (from the past 240 hour(s))  Blood Culture (routine x 2)     Status: None   Collection Time: 11/02/22  8:43 PM   Specimen: BLOOD  Result Value Ref Range  Status   Specimen Description BLOOD RIGHT ANTECUBITAL  Final   Special Requests   Final    BOTTLES DRAWN AEROBIC AND ANAEROBIC Blood Culture results may not be optimal due to an excessive volume of blood received in culture bottles   Culture   Final    NO GROWTH 5 DAYS Performed at [facility] Lab, [address], [place], [place] [address]    Report Status 11/07/2022  FINAL  Final  Urine Culture     Status: Abnormal   Collection Time: 11/02/22  9:31 PM   Specimen: Urine, Clean Catch  Result Value Ref Range Status   Specimen Description URINE, CLEAN CATCH  Final   Special Requests   Final    NONE Performed at [facility], [address], [place], [place] [address]    Culture >=100,000 COLONIES/mL ESCHERICHIA COLI (A)  Final   Report Status 11/04/2022 FINAL  Final   Organism ID, Bacteria ESCHERICHIA COLI (A)  Final      Susceptibility   Escherichia coli - MIC*    AMPICILLIN <=2 SENSITIVE Sensitive     CEFAZOLIN <=4 SENSITIVE Sensitive     CEFEPIME <=0.12 SENSITIVE Sensitive     CEFTRIAXONE <=0.25 SENSITIVE Sensitive     CIPROFLOXACIN <=0.25 SENSITIVE Sensitive     GENTAMICIN <=1 SENSITIVE Sensitive     IMIPENEM <=0.25 SENSITIVE Sensitive     NITROFURANTOIN <=16 SENSITIVE Sensitive     TRIMETH/SULFA <=20 SENSITIVE Sensitive     AMPICILLIN/SULBACTAM <=2 SENSITIVE Sensitive     PIP/TAZO <=4 SENSITIVE Sensitive     * >=100,000 COLONIES/mL ESCHERICHIA COLI  Blood Culture (routine x 2)     Status: None   Collection Time: 11/02/22  9:50 PM   Specimen: BLOOD LEFT HAND  Result Value Ref Range Status   Specimen Description BLOOD LEFT HAND  Final   Special Requests   Final    BOTTLES DRAWN AEROBIC AND ANAEROBIC Blood Culture results may not be optimal due to an excessive volume of blood received in culture bottles   Culture   Final    NO GROWTH 5 DAYS Performed at [facility] Lab, [address] [place], [place] [address]    Report Status 11/07/2022 FINAL  Final  SARS  Coronavirus 2 by RT PCR (hospital order, performed in [facility] hospital lab) *cepheid single result test* Anterior Nasal Swab     Status: None   Collection Time: 11/02/22 10:51 PM   Specimen: Anterior Nasal Swab  Result Value Ref Range Status   SARS Coronavirus 2 by RT PCR NEGATIVE NEGATIVE Final    Comment: Performed at [facility] Lab, [address] [place], [place] [address]     Labs: BNP (last 3 results) No results for input(s): "BNP" in the last 8760 hours. Basic Metabolic Panel: Recent Labs  Lab 11/06/22 0820 11/08/22 0806 11/09/22 0609  NA 136 135  --   K 4.0 4.0  --   CL 101 102  --   CO2 25 23  --   GLUCOSE 170* 153*  --   BUN 25* 24*  --   CREATININE 1.40* 1.50* 1.60*  CALCIUM 8.6* 8.8*  --    Liver Function Tests: Recent Labs  Lab 11/08/22 0806  AST 25  ALT 45*  ALKPHOS 120  BILITOT 0.4  PROT 6.2*  ALBUMIN 2.7*   No results for input(s): "LIPASE", "AMYLASE" in the last 168 hours. No results for input(s): "AMMONIA" in the last 168 hours. CBC: Recent Labs  Lab 11/08/22 0806  WBC 10.7*  NEUTROABS 7.0  HGB 10.3*  HCT 31.8*  MCV 86.9  PLT 336   Cardiac Enzymes: No results for input(s): "CKTOTAL", "CKMB", "CKMBINDEX", "TROPONINI" in the last 168 hours. BNP: Invalid input(s): "POCBNP" CBG: Recent Labs  Lab 11/11/22 1226 11/11/22 1620 11/11/22 2100 11/12/22 0815 11/12/22 1149  GLUCAP 173* 165* 145* 152* 266*   D-Dimer No results for input(s): "  DDIMER" in the last 72 hours. Hgb A1c No results for input(s): "HGBA1C" in the last 72 hours. Lipid Profile No results for input(s): "CHOL", "HDL", "LDLCALC", "TRIG", "CHOLHDL", "LDLDIRECT" in the last 72 hours. Thyroid function studies No results for input(s): "TSH", "T4TOTAL", "T3FREE", "THYROIDAB" in the last 72 hours.  Invalid input(s): "FREET3" Anemia work up No results for input(s): "VITAMINB12", "FOLATE", "FERRITIN", "TIBC", "IRON", "RETICCTPCT" in the last 72 hours. Urinalysis     Component Value Date/Time   COLORURINE YELLOW 11/02/2022 2131   APPEARANCEUR HAZY (A) 11/02/2022 2131   LABSPEC 1.017 11/02/2022 2131   PHURINE 7.0 11/02/2022 2131   GLUCOSEU 50 (A) 11/02/2022 2131   HGBUR NEGATIVE 11/02/2022 2131   BILIRUBINUR NEGATIVE 11/02/2022 2131   KETONESUR NEGATIVE 11/02/2022 2131   PROTEINUR 100 (A) 11/02/2022 2131   UROBILINOGEN 0.2 07/30/2014 1951   NITRITE NEGATIVE 11/02/2022 2131   LEUKOCYTESUR SMALL (A) 11/02/2022 2131   Sepsis Labs Recent Labs  Lab 11/08/22 0806  WBC 10.7*   Microbiology Recent Results (from the past 240 hour(s))  Blood Culture (routine x 2)     Status: None   Collection Time: 11/02/22  8:43 PM   Specimen: BLOOD  Result Value Ref Range Status   Specimen Description BLOOD RIGHT ANTECUBITAL  Final   Special Requests   Final    BOTTLES DRAWN AEROBIC AND ANAEROBIC Blood Culture results may not be optimal due to an excessive volume of blood received in culture bottles   Culture   Final    NO GROWTH 5 DAYS Performed at [facility] Lab, [address] [place], [place] [address]    Report Status 11/07/2022 FINAL  Final  Urine Culture     Status: Abnormal   Collection Time: 11/02/22  9:31 PM   Specimen: Urine, Clean Catch  Result Value Ref Range Status   Specimen Description URINE, CLEAN CATCH  Final   Special Requests   Final    NONE Performed at [facility], [address], [place], [place] [address]    Culture >=100,000 COLONIES/mL ESCHERICHIA COLI (A)  Final   Report Status 11/04/2022 FINAL  Final   Organism ID, Bacteria ESCHERICHIA COLI (A)  Final      Susceptibility   Escherichia coli - MIC*    AMPICILLIN <=2 SENSITIVE Sensitive     CEFAZOLIN <=4 SENSITIVE Sensitive     CEFEPIME <=0.12 SENSITIVE Sensitive     CEFTRIAXONE <=0.25 SENSITIVE Sensitive     CIPROFLOXACIN <=0.25 SENSITIVE Sensitive     GENTAMICIN <=1 SENSITIVE Sensitive     IMIPENEM <=0.25 SENSITIVE Sensitive     NITROFURANTOIN <=16 SENSITIVE  Sensitive     TRIMETH/SULFA <=20 SENSITIVE Sensitive     AMPICILLIN/SULBACTAM <=2 SENSITIVE Sensitive     PIP/TAZO <=4 SENSITIVE Sensitive     * >=100,000 COLONIES/mL ESCHERICHIA COLI  Blood Culture (routine x 2)     Status: None   Collection Time: 11/02/22  9:50 PM   Specimen: BLOOD LEFT HAND  Result Value Ref Range Status   Specimen Description BLOOD LEFT HAND  Final   Special Requests   Final    BOTTLES DRAWN AEROBIC AND ANAEROBIC Blood Culture results may not be optimal due to an excessive volume of blood received in culture bottles   Culture   Final    NO GROWTH 5 DAYS Performed at [facility] Lab, [address], [place], [place] [address]    Report Status 11/07/2022 FINAL  Final  SARS Coronavirus 2  by RT PCR (hospital order, performed in [facility] lab) *cepheid single result test* Anterior Nasal Swab     Status: None   Collection Time: 11/02/22 10:51 PM   Specimen: Anterior Nasal Swab  Result Value Ref Range Status   SARS Coronavirus 2 by RT PCR NEGATIVE NEGATIVE Final    Comment: Performed at [facility], [address] [place], [place] [address]    FURTHER DISCHARGE INSTRUCTIONS:   Get Medicines reviewed and adjusted: Please take all your medications with you for your next visit with your Primary MD   Laboratory/radiological data: Please request your Primary MD to go over all hospital tests and procedure/radiological results at the follow up, please ask your Primary MD to get all Hospital records sent to his/her office.   In some cases, they will be blood work, cultures and biopsy results pending at the time of your discharge. Please request that your primary care M.D. goes through all the records of your hospital data and follows up on these results.   Also Note the following: If you experience worsening of your admission symptoms, develop shortness of breath, life threatening emergency, suicidal or homicidal thoughts you must seek medical attention  immediately by calling 911 or calling your MD immediately  if symptoms less severe.   You must read complete instructions/literature along with all the possible adverse reactions/side effects for all the Medicines you take and that have been prescribed to you. Take any new Medicines after you have completely understood and accpet all the possible adverse reactions/side effects.    Do not drive when taking Pain medications or sleeping medications (Benzodaizepines)   Do not take more than prescribed Pain, Sleep and Anxiety Medications. It is not advisable to combine anxiety,sleep and pain medications without talking with your primary care practitioner   Special Instructions: If you have smoked or chewed Tobacco  in the last 2 yrs please stop smoking, stop any regular Alcohol  and or any Recreational drug use.   Wear Seat belts while driving.   Please note: You were cared for by a hospitalist during your hospital stay. Once you are discharged, your primary care physician will handle any further medical issues. Please note that NO REFILLS for any discharge medications will be authorized once you are discharged, as it is imperative that you return to your primary care physician (or establish a relationship with a primary care physician if you do not have one) for your post hospital discharge needs so that they can reassess your need for medications and monitor your lab values  Time coordinating discharge: Over 30 minutes  SIGNED:   Barnetta Chapel, MD  [facility] 11/12/2022, 2:23 PM *Please note that this is a verbal dictation therefore any spelling or grammatical errors are due to the "[employer] One" system interpretation. If 7PM-7AM, please contact night-coverage [email or web address]   Addendum: -Addendum is done on 11/12/2022.  This is an addendum to the discharge summary done by Hughie Closs, MD. -Patient has remained stable. -Patient will be discharged to skilled nursing  facility. -Patient will need to follow-up with your primary care provider within 1 week of discharge.

## 2022-11-09 NOTE — Plan of Care (Signed)

## 2022-11-10 DIAGNOSIS — A419 Sepsis, unspecified organism: Secondary | ICD-10-CM | POA: Diagnosis not present

## 2022-11-10 DIAGNOSIS — N390 Urinary tract infection, site not specified: Secondary | ICD-10-CM | POA: Diagnosis not present

## 2022-11-10 LAB — GLUCOSE, CAPILLARY
Glucose-Capillary: 167 mg/dL — ABNORMAL HIGH (ref 70–99)
Glucose-Capillary: 194 mg/dL — ABNORMAL HIGH (ref 70–99)
Glucose-Capillary: 212 mg/dL — ABNORMAL HIGH (ref 70–99)
Glucose-Capillary: 157 mg/dL — ABNORMAL HIGH (ref 70–99)

## 2022-11-10 NOTE — Progress Notes (Signed)
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  He has ongoing hospitalization due to waiting on SNF placement which is likely to occur on Monday 6/24.  Assessment & Plan:   Principal Problem:   Sepsis secondary to UTI [facility]) Active Problems:   CAD (coronary artery disease)   Type 2 diabetes mellitus (HCC)   Status post carotid endarterectomy   AKI (acute kidney injury) (HCC)   Hyperlipidemia   BPH (benign prostatic hyperplasia)   Dementia without behavioral disturbance (HCC)  Sepsis secondary to UTI POA: Met sepsis criteria based on fever, leukocytosis and tachycardia.  UA consistent with UTI, urine culture growing E. coli which is pansensitive.  Last temperature spike was of 102.4 was on 11/03/2022.  He is afebrile since then.  Completed Rocephin for 7 days.  Generalized weakness: Secondary to sepsis.  Seen by PT OT and they recommend SNF.  Patient and wife in agreement.  TOC consulted.  Pending placement, which is likely to occur 6/24.  CKD stage IIIa: At baseline.  Type 2 diabetes mellitus: Hemoglobin A1c 7.6.  Takes glipizide, Tradjenta and metformin at home.  Currently on Semglee 10 units, Tradjenta and SSI.  Blood sugar slightly elevated, will increase Semglee to 15 units.  Hyperlipidemia: Continue Pravachol.  Dementia without behavioral disturbances: Continue Namenda, Aricept and Cymbalta.  History of BPH: Continue Flomax.  PAD: Continue pravastatin and Plavix.  Essential hypertension: Controlled.  Continue Norvasc, Coreg and Cozaar.  DVT  prophylaxis: enoxaparin (LOVENOX) injection 40 mg Start: 11/03/22 1000   Code Status: Full Code  Family Communication:  None present at bedside.  Plan of care discussed with patient in length and he/she verbalized understanding and agreed with it.   Status is: Inpatient Remains inpatient appropriate because: Medically stable, pending placement to SNF.  TOC on board.   Estimated body mass index is 29.39 kg/m as calculated from the following:   Height as of this encounter: 5\' 9"  (1.753 m).   Weight as of this encounter: 90.3 kg.    Nutritional Assessment: Body mass index is 29.39 kg/m.Marland Kitchen Seen by dietician.  I agree with the assessment and plan as outlined below: Nutrition Status:     . Skin Assessment: I have examined the patient's skin and I agree with the wound assessment as performed by the wound care RN as outlined below:    Consultants:  None  Procedures:  None  Antimicrobials:  Anti-infectives (From admission, onward)    Start     Dose/Rate Route Frequency Ordered Stop   11/03/22 2200  cefTRIAXone (ROCEPHIN) 2 g in sodium chloride 0.9 % 100 mL IVPB  Status:  Discontinued        2 g 200 mL/hr over 30 Minutes Intravenous Every 24 hours 11/03/22 0737 11/03/22 1810   11/03/22 2200  cefTRIAXone (ROCEPHIN) 2 g in sodium chloride 0.9 % 100 mL IVPB        2 g 200 mL/hr over 30 Minutes Intravenous Every 24 hours 11/03/22 1810 11/08/22 2302   11/02/22 2230  cefTRIAXone (ROCEPHIN) 2 g in sodium chloride 0.9 % 100 mL IVPB  2 g 200 mL/hr over 30 Minutes Intravenous  Once 11/02/22 2224 11/03/22 0014         Subjective: Seen and examined, no complaints.  Reports pain in his bilateral wrists.  He tried Voltaren gel in the past but the Tylenol he is getting currently seems to be working.  He had a bowel movement yesterday.  Objective: Vitals:   11/09/22 0826 11/09/22 1622 11/09/22 2034 11/10/22 0416  BP: (!) 126/51 137/65 126/62 (!) 152/57  Pulse: 70 68 67 64  Resp:  18 18 15 18   Temp: 97.7 F (36.5 C) 97.7 F (36.5 C) 98.1 F (36.7 C) 97.7 F (36.5 C)  TempSrc:  Oral Oral Oral  SpO2: 97% 96% 97% 98%  Weight:      Height:        Intake/Output Summary (Last 24 hours) at 11/10/2022 1254 Last data filed at 11/10/2022 0420 Gross per 24 hour  Intake 60 ml  Output 1100 ml  Net -1040 ml   Filed Weights   11/02/22 2028  Weight: 90.3 kg    Examination:  General exam: Appears calm and comfortable  Respiratory system: Clear to auscultation. Respiratory effort normal. Cardiovascular system: S1 & S2 heard, RRR. No JVD, murmurs, rubs, gallops or clicks. No pedal edema. Gastrointestinal system: Abdomen is nondistended, soft and nontenden. Central nervous system: Alert and oriented. No focal neurological deficits. Extremities: Moves all extremities Skin: No rashes, lesions or ulcers.  Psychiatry: Judgement and insight appear normal. Mood & affect appropriate.   Data Reviewed: I have personally reviewed following labs and imaging studies  CBC: Recent Labs  Lab 11/04/22 0632 11/05/22 0420 11/08/22 0806  WBC 13.2* 9.7 10.7*  NEUTROABS  --   --  7.0  HGB 9.7* 8.9* 10.3*  HCT 29.5* 27.4* 31.8*  MCV 88.6 89.5 86.9  PLT 195 185 336   Basic Metabolic Panel: Recent Labs  Lab 11/04/22 0632 11/05/22 0420 11/06/22 0820 11/08/22 0806 11/09/22 0609  NA 134* 136 136 135  --   K 4.1 3.9 4.0 4.0  --   CL 100 99 101 102  --   CO2 23 23 25 23   --   GLUCOSE 202* 222* 170* 153*  --   BUN 23 30* 25* 24*  --   CREATININE 1.58* 1.75* 1.40* 1.50* 1.60*  CALCIUM 8.4* 8.5* 8.6* 8.8*  --    GFR: Estimated Creatinine Clearance: 41.6 mL/min (A) (by C-G formula based on SCr of 1.6 mg/dL (H)). Liver Function Tests: Recent Labs  Lab 11/08/22 0806  AST 25  ALT 45*  ALKPHOS 120  BILITOT 0.4  PROT 6.2*  ALBUMIN 2.7*   No results for input(s): "LIPASE", "AMYLASE" in the last 168 hours. No results for input(s): "AMMONIA" in the last 168  hours. Coagulation Profile: No results for input(s): "INR", "PROTIME" in the last 168 hours. Cardiac Enzymes: No results for input(s): "CKTOTAL", "CKMB", "CKMBINDEX", "TROPONINI" in the last 168 hours. BNP (last 3 results) No results for input(s): "PROBNP" in the last 8760 hours. HbA1C: No results for input(s): "HGBA1C" in the last 72 hours.  CBG: Recent Labs  Lab 11/09/22 1231 11/09/22 1602 11/09/22 2034 11/10/22 0802 11/10/22 1152  GLUCAP 221* 184* 202* 167* 194*   Lipid Profile: No results for input(s): "CHOL", "HDL", "LDLCALC", "TRIG", "CHOLHDL", "LDLDIRECT" in the last 72 hours. Thyroid Function Tests: No results for input(s): "TSH", "T4TOTAL", "FREET4", "T3FREE", "THYROIDAB" in the last 72 hours.  Anemia Panel: No results for input(s): "VITAMINB12", "FOLATE", "FERRITIN", "TIBC", "IRON", "  RETICCTPCT" in the last 72 hours.  Sepsis Labs: No results for input(s): "PROCALCITON", "LATICACIDVEN" in the last 168 hours.  Recent Results (from the past 240 hour(s))  Blood Culture (routine x 2)     Status: None   Collection Time: 11/02/22  8:43 PM   Specimen: BLOOD  Result Value Ref Range Status   Specimen Description BLOOD RIGHT ANTECUBITAL  Final   Special Requests   Final    BOTTLES DRAWN AEROBIC AND ANAEROBIC Blood Culture results may not be optimal due to an excessive volume of blood received in culture bottles   Culture   Final    NO GROWTH 5 DAYS Performed at [facility], [address] [place], [place] [address]    Report Status 11/07/2022 FINAL  Final  Urine Culture     Status: Abnormal   Collection Time: 11/02/22  9:31 PM   Specimen: Urine, Clean Catch  Result Value Ref Range Status   Specimen Description URINE, CLEAN CATCH  Final   Special Requests   Final    NONE Performed at [facility], [address], [place], [place] [address]    Culture >=100,000 COLONIES/mL ESCHERICHIA COLI (A)  Final   Report Status 11/04/2022 FINAL  Final   Organism  ID, Bacteria ESCHERICHIA COLI (A)  Final      Susceptibility   Escherichia coli - MIC*    AMPICILLIN <=2 SENSITIVE Sensitive     CEFAZOLIN <=4 SENSITIVE Sensitive     CEFEPIME <=0.12 SENSITIVE Sensitive     CEFTRIAXONE <=0.25 SENSITIVE Sensitive     CIPROFLOXACIN <=0.25 SENSITIVE Sensitive     GENTAMICIN <=1 SENSITIVE Sensitive     IMIPENEM <=0.25 SENSITIVE Sensitive     NITROFURANTOIN <=16 SENSITIVE Sensitive     TRIMETH/SULFA <=20 SENSITIVE Sensitive     AMPICILLIN/SULBACTAM <=2 SENSITIVE Sensitive     PIP/TAZO <=4 SENSITIVE Sensitive     * >=100,000 COLONIES/mL ESCHERICHIA COLI  Blood Culture (routine x 2)     Status: None   Collection Time: 11/02/22  9:50 PM   Specimen: BLOOD LEFT HAND  Result Value Ref Range Status   Specimen Description BLOOD LEFT HAND  Final   Special Requests   Final    BOTTLES DRAWN AEROBIC AND ANAEROBIC Blood Culture results may not be optimal due to an excessive volume of blood received in culture bottles   Culture   Final    NO GROWTH 5 DAYS Performed at [facility], [address] [place], [place] [address]    Report Status 11/07/2022 FINAL  Final  SARS Coronavirus 2 by RT PCR (hospital order, performed in [facility] hospital lab) *cepheid single result test* Anterior Nasal Swab     Status: None   Collection Time: 11/02/22 10:51 PM   Specimen: Anterior Nasal Swab  Result Value Ref Range Status   SARS Coronavirus 2 by RT PCR NEGATIVE NEGATIVE Final    Comment: Performed at [facility], [address], [place], [place] [address]     Radiology Studies: No results found.  Scheduled Meds:  amLODipine  10 mg Oral Daily   carvedilol  6.25 mg Oral BID WC   clopidogrel  75 mg Oral Daily   cyanocobalamin  500 mcg Oral Daily   donepezil  10 mg Oral q AM   DULoxetine  20 mg Oral QPM   enoxaparin (LOVENOX) injection  40 mg Subcutaneous Daily   famotidine  20 mg Oral QHS  insulin aspart  0-20 Units Subcutaneous TID WC   insulin aspart   0-5 Units Subcutaneous QHS   insulin aspart  3 Units Subcutaneous TID WC   insulin glargine-yfgn  20 Units Subcutaneous QHS   linagliptin  5 mg Oral QPM   loratadine  10 mg Oral Daily   losartan  100 mg Oral Daily   memantine  5 mg Oral BID   pravastatin  40 mg Oral Daily   tamsulosin  0.4 mg Oral QPM      LOS: 8 days   Charolotte Eke, MD [facility]  11/10/2022, 12:54 PM   *Please note that this is a verbal dictation therefore any spelling or grammatical errors are due to the "Dragon Medical One" system interpretation.  Please page via Amion and do not message via secure chat for urgent patient care matters. Secure chat can be used for non urgent patient care matters.  How to contact the [facility] Attending or Consulting provider 7A - 7P or covering provider during after hours 7P -7A, for this patient?  Check the care team in [facility] and look for a) attending/consulting TRH provider listed and b) the [facility] team listed. Page or secure chat 7A-7P. Log into [email or web address] and use [place]'s universal password to access. If you do not have the password, please contact the hospital operator. Locate the [facility] provider you are looking for under [facility] and page to a number that you can be directly reached. If you still have difficulty reaching the provider, please page the [facility] (Director on Call) for the Hospitalists listed on amion for assistance.

## 2022-11-10 NOTE — Progress Notes (Signed)
Pt refuses cpap

## 2022-11-10 NOTE — Progress Notes (Signed)
   11/09/22 2300  BiPAP/CPAP/SIPAP  Reason BIPAP/CPAP not in use Non-compliant   Pt refused CPAP for nightime use.

## 2022-11-10 NOTE — Plan of Care (Signed)

## 2022-11-11 DIAGNOSIS — A419 Sepsis, unspecified organism: Secondary | ICD-10-CM | POA: Diagnosis not present

## 2022-11-11 DIAGNOSIS — N390 Urinary tract infection, site not specified: Secondary | ICD-10-CM | POA: Diagnosis not present

## 2022-11-11 LAB — GLUCOSE, CAPILLARY
Glucose-Capillary: 144 mg/dL — ABNORMAL HIGH (ref 70–99)
Glucose-Capillary: 173 mg/dL — ABNORMAL HIGH (ref 70–99)
Glucose-Capillary: 165 mg/dL — ABNORMAL HIGH (ref 70–99)
Glucose-Capillary: 145 mg/dL — ABNORMAL HIGH (ref 70–99)

## 2022-11-11 NOTE — Progress Notes (Signed)
   11/11/22 2017  BiPAP/CPAP/SIPAP  Reason BIPAP/CPAP not in use Non-compliant   Pt refused CPAP for night time use.

## 2022-11-11 NOTE — Plan of Care (Signed)

## 2022-11-11 NOTE — Progress Notes (Addendum)
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  11/11/2022: Patient seen.  No new complaints.  Patient is awaiting disposition.   Assessment & Plan:   Principal Problem:   Sepsis secondary to UTI [facility]) Active Problems:   CAD (coronary artery disease)   Type 2 diabetes mellitus (HCC)   Status post carotid endarterectomy   AKI (acute kidney injury) (HCC)   Hyperlipidemia   BPH (benign prostatic hyperplasia)   Dementia without behavioral disturbance (HCC)  Severe Sepsis secondary to UTI POA:  UTI secondary to E.Coli: -Severe sepsis, present on admission. -Fever, leukocytosis and tachycardia on presentation. -Worsening renal function -UA consistent with UTI -Urine culture grew E. coli   11/11/2022: Sepsis physiology has resolved.  Pursue disposition.  Generalized weakness: Secondary to sepsis.  Seen by PT OT and they recommend SNF.  Patient and wife in agreement.  TOC consulted.  Pending placement. 11/11/2022: Pursue disposition.  CKD stage IIIa: At baseline.  Type 2 diabetes mellitus: Hemoglobin A1c 7.6.  Takes glipizide, Tradjenta and metformin at home.  Currently on Semglee 10 units, Tradjenta and SSI.  Blood sugar slightly elevated, will increase Semglee to 15 units.  Hyperlipidemia: Continue Pravachol.  Dementia without behavioral disturbances: Continue Namenda, Aricept and Cymbalta.  History of BPH: Continue Flomax.  PAD: Continue pravastatin and Plavix.  Essential hypertension: Controlled.  Continue Norvasc, Coreg and Cozaar.  DVT prophylaxis:  enoxaparin (LOVENOX) injection 40 mg Start: 11/03/22 1000   Code Status: Full Code  Family Communication:  None present at bedside.  Plan of care discussed with patient in length and he/she verbalized understanding and agreed with it.  Also discussed with his wife over the phone.  Status is: Inpatient Remains inpatient appropriate because: Medically stable, pending placement to SNF.  TOC on board.   Estimated body mass index is 29.39 kg/m as calculated from the following:   Height as of this encounter: 5\' 9"  (1.753 m).   Weight as of this encounter: 90.3 kg.    Nutritional Assessment: Body mass index is 29.39 kg/m.Marland Kitchen Seen by dietician.  I agree with the assessment and plan as outlined below: Nutrition Status:        . Skin Assessment: I have examined the patient's skin and I agree with the wound assessment as performed by the wound care RN as outlined below:    Consultants:  None  Procedures:  None  Antimicrobials:  Anti-infectives (From admission, onward)    Start     Dose/Rate Route Frequency Ordered Stop   11/03/22 2200  cefTRIAXone (ROCEPHIN) 2 g in sodium chloride 0.9 % 100 mL IVPB  Status:  Discontinued        2 g 200 mL/hr over 30 Minutes Intravenous Every 24 hours 11/03/22 0737 11/03/22 1810   11/03/22 2200  cefTRIAXone (ROCEPHIN) 2 g in sodium chloride 0.9 % 100 mL IVPB        2 g 200 mL/hr over 30 Minutes Intravenous Every 24 hours 11/03/22 1810 11/08/22 2302   11/02/22 2230  cefTRIAXone (ROCEPHIN) 2 g in sodium chloride 0.9 % 100 mL IVPB  2 g 200 mL/hr over 30 Minutes Intravenous  Once 11/02/22 2224 11/03/22 0014         Subjective: No new complaints.  Objective: Vitals:   11/10/22 2004 11/11/22 0436 11/11/22 0744 11/11/22 1650  BP: 127/70 (!) 162/75 (!) 141/54 (!) 136/50  Pulse: 66 72 64 63  Resp: 16  16 18   Temp: 98.4 F (36.9 C) 97.7 F (36.5 C) 98 F (36.7 C) 98.2 F (36.8 C)  TempSrc: Oral   Oral  SpO2: 98% 97% 99% 100%  Weight:       Height:        Intake/Output Summary (Last 24 hours) at 11/11/2022 1856 Last data filed at 11/11/2022 1820 Gross per 24 hour  Intake --  Output 3450 ml  Net -3450 ml    Filed Weights   11/02/22 2028  Weight: 90.3 kg    Examination:  General exam: Appears calm and comfortable.  Not in any distress. Respiratory system: Clear to auscultation.  Cardiovascular system: S1 & S2 heard. Gastrointestinal system: Abdomen is soft and nontender.   Central nervous system: Alert and oriented.  Extremities: Symmetric: No leg edema.  Data Reviewed: I have personally reviewed following labs and imaging studies  CBC: Recent Labs  Lab 11/05/22 0420 11/08/22 0806  WBC 9.7 10.7*  NEUTROABS  --  7.0  HGB 8.9* 10.3*  HCT 27.4* 31.8*  MCV 89.5 86.9  PLT 185 336    Basic Metabolic Panel: Recent Labs  Lab 11/05/22 0420 11/06/22 0820 11/08/22 0806 11/09/22 0609  NA 136 136 135  --   K 3.9 4.0 4.0  --   CL 99 101 102  --   CO2 23 25 23   --   GLUCOSE 222* 170* 153*  --   BUN 30* 25* 24*  --   CREATININE 1.75* 1.40* 1.50* 1.60*  CALCIUM 8.5* 8.6* 8.8*  --     GFR: Estimated Creatinine Clearance: 41.6 mL/min (A) (by C-G formula based on SCr of 1.6 mg/dL (H)). Liver Function Tests: Recent Labs  Lab 11/08/22 0806  AST 25  ALT 45*  ALKPHOS 120  BILITOT 0.4  PROT 6.2*  ALBUMIN 2.7*    No results for input(s): "LIPASE", "AMYLASE" in the last 168 hours. No results for input(s): "AMMONIA" in the last 168 hours. Coagulation Profile: No results for input(s): "INR", "PROTIME" in the last 168 hours.  Cardiac Enzymes: No results for input(s): "CKTOTAL", "CKMB", "CKMBINDEX", "TROPONINI" in the last 168 hours. BNP (last 3 results) No results for input(s): "PROBNP" in the last 8760 hours. HbA1C: No results for input(s): "HGBA1C" in the last 72 hours.  CBG: Recent Labs  Lab 11/10/22 1602 11/10/22 2140 11/11/22 0857 11/11/22 1226 11/11/22 1620  GLUCAP 212* 157* 144* 173*  165*    Lipid Profile: No results for input(s): "CHOL", "HDL", "LDLCALC", "TRIG", "CHOLHDL", "LDLDIRECT" in the last 72 hours. Thyroid Function Tests: No results for input(s): "TSH", "T4TOTAL", "FREET4", "T3FREE", "THYROIDAB" in the last 72 hours.  Anemia Panel: No results for input(s): "VITAMINB12", "FOLATE", "FERRITIN", "TIBC", "IRON", "RETICCTPCT" in the last 72 hours.  Sepsis Labs: No results for input(s): "PROCALCITON", "LATICACIDVEN" in the last 168 hours.   Recent Results (from the past 240 hour(s))  Blood Culture (routine x 2)     Status: None   Collection Time: 11/02/22  8:43 PM   Specimen: BLOOD  Result Value Ref Range Status   Specimen Description BLOOD RIGHT ANTECUBITAL  Final   Special Requests   Final  BOTTLES DRAWN AEROBIC AND ANAEROBIC Blood Culture results may not be optimal due to an excessive volume of blood received in culture bottles   Culture   Final    NO GROWTH 5 DAYS Performed at [facility], [address], [place], [place] [address]    Report Status 11/07/2022 FINAL  Final  Urine Culture     Status: Abnormal   Collection Time: 11/02/22  9:31 PM   Specimen: Urine, Clean Catch  Result Value Ref Range Status   Specimen Description URINE, CLEAN CATCH  Final   Special Requests   Final    NONE Performed at [facility], [address] [place], [place] [address]    Culture >=100,000 COLONIES/mL ESCHERICHIA COLI (A)  Final   Report Status 11/04/2022 FINAL  Final   Organism ID, Bacteria ESCHERICHIA COLI (A)  Final      Susceptibility   Escherichia coli - MIC*    AMPICILLIN <=2 SENSITIVE Sensitive     CEFAZOLIN <=4 SENSITIVE Sensitive     CEFEPIME <=0.12 SENSITIVE Sensitive     CEFTRIAXONE <=0.25 SENSITIVE Sensitive     CIPROFLOXACIN <=0.25 SENSITIVE Sensitive     GENTAMICIN <=1 SENSITIVE Sensitive     IMIPENEM <=0.25 SENSITIVE Sensitive     NITROFURANTOIN <=16 SENSITIVE Sensitive     TRIMETH/SULFA <=20 SENSITIVE Sensitive      AMPICILLIN/SULBACTAM <=2 SENSITIVE Sensitive     PIP/TAZO <=4 SENSITIVE Sensitive     * >=100,000 COLONIES/mL ESCHERICHIA COLI  Blood Culture (routine x 2)     Status: None   Collection Time: 11/02/22  9:50 PM   Specimen: BLOOD LEFT HAND  Result Value Ref Range Status   Specimen Description BLOOD LEFT HAND  Final   Special Requests   Final    BOTTLES DRAWN AEROBIC AND ANAEROBIC Blood Culture results may not be optimal due to an excessive volume of blood received in culture bottles   Culture   Final    NO GROWTH 5 DAYS Performed at [facility], [address] [place], [place] [address]    Report Status 11/07/2022 FINAL  Final  SARS Coronavirus 2 by RT PCR (hospital order, performed in [facility] hospital lab) *cepheid single result test* Anterior Nasal Swab     Status: None   Collection Time: 11/02/22 10:51 PM   Specimen: Anterior Nasal Swab  Result Value Ref Range Status   SARS Coronavirus 2 by RT PCR NEGATIVE NEGATIVE Final    Comment: Performed at [facility], [address] [place], [place] [address]     Radiology Studies: No results found.  Scheduled Meds:  amLODipine  10 mg Oral Daily   carvedilol  6.25 mg Oral BID WC   clopidogrel  75 mg Oral Daily   cyanocobalamin  500 mcg Oral Daily   donepezil  10 mg Oral q AM   DULoxetine  20 mg Oral QPM   enoxaparin (LOVENOX) injection  40 mg Subcutaneous Daily   famotidine  20 mg Oral QHS   insulin aspart  0-20 Units Subcutaneous TID WC   insulin aspart  0-5 Units Subcutaneous QHS   insulin aspart  3 Units Subcutaneous TID WC   insulin glargine-yfgn  20 Units Subcutaneous QHS   linagliptin  5 mg Oral QPM   loratadine  10 mg Oral Daily   losartan  100 mg Oral Daily   memantine  5 mg Oral BID   pravastatin  40 mg Oral Daily  tamsulosin  0.4 mg Oral QPM   Continuous Infusions:     LOS: 9 days   Barnetta Chapel, MD [facility]  11/11/2022, 6:56 PM   *Please note that this is a verbal  dictation therefore any spelling or grammatical errors are due to the "Dragon Medical One" system interpretation.  Please page via Amion and do not message via secure chat for urgent patient care matters. Secure chat can be used for non urgent patient care matters.  How to contact the [facility] Attending or Consulting provider 7A - 7P or covering provider during after hours 7P -7A, for this patient?  Check the care team in [facility] and look for a) attending/consulting TRH provider listed and b) the [facility] team listed. Page or secure chat 7A-7P. Log into [email or web address] and use [facility]'s universal password to access. If you do not have the password, please contact the hospital operator. Locate the [facility] provider you are looking for under [facility] and page to a number that you can be directly reached. If you still have difficulty reaching the provider, please page the [facility] (Director on Call) for the Hospitalists listed on amion for assistance.

## 2022-11-12 DIAGNOSIS — A419 Sepsis, unspecified organism: Secondary | ICD-10-CM | POA: Diagnosis not present

## 2022-11-12 DIAGNOSIS — N390 Urinary tract infection, site not specified: Secondary | ICD-10-CM | POA: Diagnosis not present

## 2022-11-12 LAB — GLUCOSE, CAPILLARY
Glucose-Capillary: 152 mg/dL — ABNORMAL HIGH (ref 70–99)
Glucose-Capillary: 266 mg/dL — ABNORMAL HIGH (ref 70–99)

## 2022-11-12 NOTE — Progress Notes (Signed)
Physical Therapy Treatment Patient Details Name: [name] MRN: [id number] DOB: 02/04/1946 Today's Date: 11/12/2022   History of Present Illness 77 year old admitted due to weakness and confusion with UTI.  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    PT Comments    Pt admitted with above diagnosis. Pt was able to ambulate with RW with min assist as pt is unsteady at times with significant posterior lean. Pt needs constant support for safety. Continue to recommend post acute rehab.  Pt currently with functional limitations due to the deficits listed below (see PT Problem List). Pt will benefit from acute skilled PT to increase their independence and safety with mobility to allow discharge.      Recommendations for follow up therapy are one component of a multi-disciplinary discharge planning process, led by the attending physician.  Recommendations Billy be updated based on patient status, additional functional criteria and insurance authorization.  Follow Up Recommendations  Can patient physically be transported by private vehicle: No    Assistance Recommended at Discharge Frequent or constant Supervision/Assistance  Patient can return home with the following A little help with walking and/or transfers;A little help with bathing/dressing/bathroom;Assistance with cooking/housework;Assist for transportation;Help with stairs or ramp for entrance   Equipment Recommendations  None recommended by PT    Recommendations for Other Services       Precautions / Restrictions Precautions Precautions: Fall Precaution Comments: inattention to the task often Restrictions Weight Bearing Restrictions: No     Mobility  Bed Mobility Overal bed mobility: Needs Assistance Bed Mobility: Rolling Rolling:  Min guard Sidelying to sit: Min assist Supine to sit: Min assist     General bed mobility comments: Assist given for safety.    Transfers Overall transfer level: Needs assistance Equipment used: Rolling walker (2 wheels) Transfers: Sit to/from Stand Sit to Stand: Mod assist           General transfer comment: Pt needs fluctuating support due to posterior LOB and pt doesnt correct therefore can need mod assist.    Ambulation/Gait Ambulation/Gait assistance: Min assist Gait Distance (Feet): 250 Feet Assistive device: Rolling walker (2 wheels) Gait Pattern/deviations: Step-through pattern, Decreased stride length, Wide base of support, Drifts right/left, Decreased weight shift to left Gait velocity: reduced Gait velocity interpretation: <1.31 ft/sec, indicative of household ambulator   General Gait Details: tends to list back and L but is also dealing with strength and ROM changes on LLE.  Needs min assist for safety. Was able to incr distance today.   Stairs             Wheelchair Mobility    Modified Rankin (Stroke Patients Only)       Balance Overall balance assessment: Needs assistance Sitting-balance support: Feet supported Sitting balance-Leahy Scale: Fair Sitting balance - Comments: posterior LOB in static sitting   Standing balance support: Bilateral upper extremity supported, During functional activity Standing balance-Leahy Scale: Poor Standing balance comment: LOB posteriorly in standing with mobility                            Cognition Arousal/Alertness: Awake/alert Behavior During Therapy:  (distracted) Overall Cognitive Status: No family/caregiver present to determine baseline cognitive functioning Area of Impairment: Attention, Following commands, Awareness, Problem solving  Current Attention Level: Selective Memory: Decreased recall of precautions, Decreased short-term memory Following Commands:  Follows one step commands with increased time Safety/Judgement: Decreased awareness of safety, Decreased awareness of deficits Awareness: Intellectual Problem Solving: Slow processing General Comments: reinstructed for mobility on RW, requires direct intervention for safety due to focus on direction and posterior LOB        Exercises General Exercises - Lower Extremity Ankle Circles/Pumps: AROM, AAROM, 5 reps Heel Slides: AROM, AAROM, 10 reps Hip ABduction/ADduction: AAROM, AROM, 10 reps    General Comments        Pertinent Vitals/Pain Pain Assessment Pain Assessment: No/denies pain Faces Pain Scale: No hurt    Home Living                          Prior Function            PT Goals (current goals can now be found in the care plan section) Acute Rehab PT Goals Patient Stated Goal: to get home Progress towards PT goals: Progressing toward goals    Frequency    Min 3X/week      PT Plan Current plan remains appropriate    Co-evaluation              AM-PAC PT "6 Clicks" Mobility   Outcome Measure  Help needed turning from your back to your side while in a flat bed without using bedrails?: A Little Help needed moving from lying on your back to sitting on the side of a flat bed without using bedrails?: A Little Help needed moving to and from a bed to a chair (including a wheelchair)?: A Little Help needed standing up from a chair using your arms (e.g., wheelchair or bedside chair)?: A Lot Help needed to walk in hospital room?: A Little Help needed climbing 3-5 steps with a railing? : A Lot 6 Click Score: 16    End of Session Equipment Utilized During Treatment: Gait belt Activity Tolerance: Patient limited by fatigue Patient left: in chair;with call bell/phone within reach;with chair alarm set;with family/visitor present Nurse Communication: Mobility status PT Visit Diagnosis: Unsteadiness on feet (R26.81);Muscle weakness (generalized)  (M62.81)     Time: 0865-7846 PT Time Calculation (min) (ACUTE ONLY): 16 min  Charges:  $Gait Training: 8-22 mins                     [name] M,PT Acute Rehab Services [phone]    [name] 11/12/2022, 2:07 PM

## 2022-11-12 NOTE — TOC Progression Note (Signed)
Transition of Care [facility]) - Progression Note    Patient Details  Name: [name] MRN: [id number] Date of Birth: [birth year]  Transition of [facility]) CM/SW Contact  Keylen Uzelac A [place], [place] Phone Number: 11/12/2022, 3:33 PM  Clinical Narrative:     CSW spoke with Renetta Chalk at the [place], [phone].  She said that she saw in the [place] system that pt was approved for short term rehab at a skilled facility. CSW informed her the pt received bed offer from [facility]. She confirmed with Whitney at pt was accepted and approved pt's authorization for rehab. Zonia Kief stated that [facility] will authorize him via [facility] days in [facility].    CSW will update pt and pt's family and facilitate dc today.   TOC will continue to follow.   Expected Discharge Plan: Skilled Nursing Facility Barriers to Discharge: Barriers Resolved  Expected Discharge Plan and Services   Discharge Planning Services: CM Consult Post Acute Care Choice: Skilled Nursing Facility Living arrangements for the past 2 months: Single Family Home Expected Discharge Date: 11/12/22                                     Social Determinants of Health (SDOH) Interventions SDOH Screenings   Food Insecurity: No Food Insecurity (11/03/2022)  Housing: Low Risk  (11/03/2022)  Transportation Needs: No Transportation Needs (11/03/2022)  Utilities: Not At Risk (11/03/2022)  Tobacco Use: Medium Risk (11/03/2022)    Readmission Risk Interventions    11/06/2022    3:32 PM 11/05/2022   12:47 PM  Readmission Risk Prevention Plan  Transportation Screening Complete Complete  PCP or Specialist Appt within 5-7 Days Complete Complete  Home Care Screening Complete Complete  Medication Review (RN CM) Complete Complete

## 2022-11-12 NOTE — TOC Transition Note (Signed)
Transition of Care [facility]) - CM/SW Discharge Note   Patient Details  Name: [name] MRN: [id number] Date of Birth: Jul 30, 1945  Transition of Care [facility]) CM/SW Contact:  Reyes Fifield A [place], [place]resia Majors Phone Number: 11/12/2022, 3:33 PM   Clinical Narrative:     Patient will DC to: [facility]  Anticipated DC date: 11/12/22  Family notified: Kimber Relic  Transport by: Sharin Mons      Per MD patient ready for DC to [facility]. RN, patient, patient's family, and facility notified of DC. Discharge Summary and FL2 sent to facility. RN to call report prior to discharge ( Room 11, [phone]). DC packet on chart. Ambulance transport requested for patient.     CSW will sign off for now as social work intervention is no longer needed. Please consult [place] again if new needs arise.    Final next level of care: Skilled Nursing Facility Barriers to Discharge: Barriers Resolved   Patient Goals and CMS Choice CMS Medicare.gov Compare Post Acute Care list provided to:: Patient Choice offered to / list presented to : Patient  Discharge Placement                Patient chooses bed at: [facility] at [facility] Patient to be transferred to facility by: [facility] Name of family member notified: Kimber Relic Patient and family notified of of transfer: 11/12/22  Discharge Plan and Services Additional resources added to the After Visit Summary for     Discharge Planning Services: CM Consult Post Acute Care Choice: Skilled Nursing Facility                               Social Determinants of Health (SDOH) Interventions SDOH Screenings   Food Insecurity: No Food Insecurity (11/03/2022)  Housing: Low Risk  (11/03/2022)  Transportation Needs: No Transportation Needs (11/03/2022)  Utilities: Not At Risk (11/03/2022)  Tobacco Use: Medium Risk (11/03/2022)     Readmission Risk Interventions    11/06/2022    3:32 PM 11/05/2022   12:47 PM  Readmission Risk Prevention Plan  Transportation Screening  Complete Complete  PCP or Specialist Appt within 5-7 Days Complete Complete  Home Care Screening Complete Complete  Medication Review (RN CM) Complete Complete

## 2022-11-12 NOTE — Progress Notes (Signed)
Report attempted to [place] at [name]. HIPAA compliant VM left, requesting a call back.

## 2023-01-16 ENCOUNTER — Ambulatory Visit: Payer: Medicare Other | Admitting: Neurology

## 2023-02-26 ENCOUNTER — Encounter (HOSPITAL_BASED_OUTPATIENT_CLINIC_OR_DEPARTMENT_OTHER): Payer: No Typology Code available for payment source | Attending: Internal Medicine | Admitting: Internal Medicine

## 2023-02-26 DIAGNOSIS — L97418 Non-pressure chronic ulcer of right heel and midfoot with other specified severity: Secondary | ICD-10-CM | POA: Insufficient documentation

## 2023-02-26 DIAGNOSIS — E11621 Type 2 diabetes mellitus with foot ulcer: Secondary | ICD-10-CM | POA: Diagnosis present

## 2023-02-26 DIAGNOSIS — E1142 Type 2 diabetes mellitus with diabetic polyneuropathy: Secondary | ICD-10-CM | POA: Diagnosis not present

## 2023-02-26 DIAGNOSIS — E1122 Type 2 diabetes mellitus with diabetic chronic kidney disease: Secondary | ICD-10-CM | POA: Insufficient documentation

## 2023-02-26 DIAGNOSIS — I6529 Occlusion and stenosis of unspecified carotid artery: Secondary | ICD-10-CM | POA: Insufficient documentation

## 2023-02-26 DIAGNOSIS — N1832 Chronic kidney disease, stage 3b: Secondary | ICD-10-CM | POA: Diagnosis not present

## 2023-02-26 DIAGNOSIS — L97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: Secondary | ICD-10-CM | POA: Insufficient documentation

## 2023-02-26 DIAGNOSIS — E1151 Type 2 diabetes mellitus with diabetic peripheral angiopathy without gangrene: Secondary | ICD-10-CM | POA: Insufficient documentation

## 2023-02-26 DIAGNOSIS — I129 Hypertensive chronic kidney disease with stage 1 through stage 4 chronic kidney disease, or unspecified chronic kidney disease: Secondary | ICD-10-CM | POA: Diagnosis not present

## 2023-02-28 NOTE — Progress Notes (Signed)
hospital within the last three years: Yes Total Score: 100 Level Of Care: New/Established - Level 3 Electronic Signature(s) Signed: 02/28/2023 4:20:59 PM By: [name] Entered By: [name] on 02/26/2023 14:12:06 [name] ([id number]) [id number] Page 3 of 10 -------------------------------------------------------------------------------- Encounter Discharge Information Details Patient Name: Date of Service: [name], [name] 02/26/2023 1:15 PM Medical Record Number: [id number] Patient Account Number: [id number] Date of Birth/Sex: Treating RN: 02/28/1946 (77 y.o. [name] Primary Care Sameul [name]: [name] Other Clinician: Referring Dorothie Wah: Treating [name]/Extender: [name], [place] in Treatment: 0 Encounter Discharge Information Items Post Procedure Vitals Discharge Condition: Stable Temperature (F): 98.1 Ambulatory Status: Walker Pulse (bpm): 80 Discharge Destination: Home Respiratory Rate (breaths/min): 18 Transportation: Private Auto Blood Pressure (mmHg): 128/64 Accompanied By: wife Schedule Follow-up Appointment: Yes Clinical Summary of Care: Patient Declined Electronic Signature(s) Signed: 02/28/2023 4:20:59 PM By: [name] Entered By: [name] on 02/26/2023 15:06:45 -------------------------------------------------------------------------------- Lower Extremity Assessment Details Patient Name: Date of Service: [name], [name] 02/26/2023 1:15 PM Medical Record Number: [id number] Patient Account Number:  [id number] Date of Birth/Sex: Treating RN: Mar 12, 1946 (77 y.o. [name] Primary Care [name]: [facility], [facility] Other Clinician: Referring [name]: Treating [name]/Extender: [name], [place] in Treatment: 0 Edema Assessment Assessed: [Left: No] [Right: No] [Left: Edema] [Right: :] Calf Left: Right: Point of Measurement: From Medial Instep 35 cm 36 cm Ankle Left: Right: Point of Measurement: From Medial Instep 22 cm 22 cm Knee To Floor Left: Right: From Medial Instep 44 cm 44 cm Vascular Assessment Pulses: Dorsalis Pedis Palpable: [Left:Yes] [Right:Yes] Extremity colors, hair growth, and conditions: Extremity Color: [Left:Normal] [Right:Normal] Hair Growth on Extremity: [Left:No] [Right:No] Temperature of Extremity: [Left:Warm] [Right:Warm] Capillary Refill: [Left:< 3 seconds] [Right:< 3 seconds] Blood Pressure: BOLTON, CANUPP ([id number]) [Right:[id number] Page 4 of 10] Brachial: [Left:156] [Right:156] Ankle: [Left:Dorsalis Pedis: 117 0.75] [Right:Dorsalis Pedis: 173 1.11] Toe Nail Assessment Left: Right: Thick: Yes Yes Discolored: Yes Yes Deformed: Yes Yes Improper Length and Hygiene: Yes Yes Electronic Signature(s) Signed: 02/28/2023 4:20:59 PM By: [name] Entered By: [name] on 02/26/2023 13:46:45 -------------------------------------------------------------------------------- Multi Wound Chart Details Patient Name: Date of Service: [name], [name] 02/26/2023 1:15 PM Medical Record Number: [id number] Patient Account Number: [id number] Date of Birth/Sex: Treating RN: Sep 10, 1945 (77 y.o. 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 N/A Primary Etiology: Extremity Extremity Arrhythmia, Coronary Artery Disease, Arrhythmia, Coronary Artery Disease, N/A Comorbid History: Hypertension, Peripheral Venous Hypertension, Peripheral Venous Disease, Type II Diabetes, Disease, Type II Diabetes, Osteoarthritis, Neuropathy Osteoarthritis, Neuropathy 01/20/2023 01/20/2023 N/A Date Acquired: 0 0 N/A Weeks of Treatment: Open Open N/A Wound Status: No No N/A Wound Recurrence: 1.8x1.2x0.2 1.2x1.2x0.3 N/A Measurements L x W x D (cm) 1.696 1.131 N/A A (cm) : rea 0.339 0.339 N/A Volume (cm) : Grade 3 Grade 2 N/A Classification: Medium Medium N/A Exudate A mount: Serosanguineous Serosanguineous N/A Exudate Type: red, brown red, brown N/A Exudate Color: Medium (34-66%) Medium (34-66%) N/A Granulation A mount: Red, Pink Red, Pink N/A Granulation Quality: Medium (34-66%) Medium (34-66%) N/A Necrotic A mount: Fat Layer (Subcutaneous Tissue): Yes  Fat Layer (Subcutaneous Tissue): Yes N/A Exposed Structures: Debridement - Selective/Open Wound Debridement - Selective/Open Wound N/A Debridement: Pre-procedure Verification/Time Out 14:25 14:25 N/A Taken: Billy, [name] ([id number]) [id number] Page 5 of 10 Lidocaine 4% Topical Solution Lidocaine 4% Topical Solution N/A Pain Control: [facility] N/A Tissue Debrided: Non-Viable Tissue Non-Viable Tissue N/A Level: 1.7 1.13 N/A Debridement A (sq cm): rea Curette Curette N/A Instrument: Minimum Minimum N/A Bleeding: Pressure Pressure N/A Hemostasis A chieved: Procedure was tolerated well Procedure was tolerated well N/A Debridement Treatment  Response: 1.8x1.2x0.2 1.2x1.2x0.3 N/A Post Debridement Measurements L x W x D (cm) 0.339 0.339 N/A Post Debridement Volume: (cm) No Abnormalities Noted No Abnormalities Noted N/A Periwound Skin Texture: Dry/Scaly: Yes Dry/Scaly: Yes N/A Periwound Skin Moisture: No Abnormalities Noted No Abnormalities Noted N/A Periwound Skin Color: Debridement Debridement N/A Procedures Performed: Treatment Notes Wound #1 (Calcaneus) Wound Laterality: Left, Posterior Cleanser Peri-Wound Care Topical Primary Dressing IODOFLEX 0.9% Cadexomer Iodine Pad 4x6 cm Discharge Instruction: Apply to wound bed as instructed Secondary Dressing ALLEVYN Heel 4 1/2in x 5 1/2in / 10.5cm x 13.5cm Discharge Instruction: Apply over primary dressing as directed. Secured With [employer], 4.5x3.1 (in/yd) Discharge Instruction: Secure with Kerlix as directed. 35M Medipore H Soft Cloth Surgical T ape, 4 x 10 (in/yd) Discharge Instruction: Secure with tape as directed. Compression Wrap Compression Stockings Add-Ons Wound #2 (Calcaneus) Wound Laterality: Right, Posterior Cleanser Peri-Wound Care Topical Primary Dressing IODOFLEX 0.9% Cadexomer Iodine Pad 4x6 cm Discharge Instruction: Apply to wound bed as instructed Secondary Dressing ALLEVYN Heel 4 1/2in x 5 1/2in / 10.5cm x 13.5cm Discharge Instruction: Apply over primary dressing as directed. Secured With [employer], 4.5x3.1 (in/yd) Discharge Instruction: Secure with Kerlix as directed. 35M Medipore H Soft Cloth Surgical T ape, 4 x 10 (in/yd) Discharge Instruction: Secure with tape as directed. Compression Wrap Compression Stockings Add-Ons Electronic Signature(s) Signed: 02/27/2023 9:33:55 AM By: Billy Najjar MD [name] ([id number]) [id number] Page 6 of 10 Entered By: [name] on 02/26/2023  17:16:02 -------------------------------------------------------------------------------- Multi-Disciplinary Care Plan Details Patient Name: Date of Service: [name], [name] 02/26/2023 1:15 PM Medical Record Number: [id number] Patient Account Number: [id number] Date of Birth/Sex: Treating RN: 06/30/1945 (77 y.o. [name] Primary Care Robbie Rideaux: [name] Other Clinician: Referring Eliz Nigg: Treating Ercil Cassis/Extender: [name], [place] in Treatment: 0 Active Inactive Wound/Skin Impairment Nursing Diagnoses: Knowledge deficit related to smoking impact on wound healing Goals: Patient/caregiver will verbalize understanding of skin care regimen Date Initiated: 02/26/2023 Target Resolution Date: 04/20/2023 Goal Status: Active Interventions: Assess patient/caregiver ability to obtain necessary supplies Assess patient/caregiver ability to perform ulcer/skin care regimen upon admission and as needed Assess ulceration(s) every visit Provide education on ulcer and skin care Screen for HBO Treatment Activities: Consult for HBO : 02/26/2023 Skin care regimen initiated : 02/26/2023 Topical wound management initiated : 02/26/2023 Notes: Electronic Signature(s) Signed: 02/28/2023 4:20:59 PM By: [name] Entered By: [name] on 02/26/2023 13:59:05 -------------------------------------------------------------------------------- Pain Assessment Details Patient Name: Date of Service: [name], [name] 02/26/2023 1:15 PM Medical Record Number: [id number] Patient Account Number: [id number] Date of Birth/Sex: Treating RN: 1945/11/07 (77 y.o. [name] Primary Care Jeffey Janssen: [name] Other Clinician: Referring Hava Massingale: Treating Loyde Orth/Extender: [name], [place] in Treatment: 0 Active Problems Location of Pain Severity and Description of Pain Patient Has Paino No Site Locations JERRIC, OYEN ([id number]) [id number] Page 7 of 10 Pain Management and Medication Current Pain Management: Electronic Signature(s) Signed:  Fat Layer (Subcutaneous Tissue): Yes N/A Exposed Structures: Debridement - Selective/Open Wound Debridement - Selective/Open Wound N/A Debridement: Pre-procedure Verification/Time Out 14:25 14:25 N/A Taken: Billy, [name] ([id number]) [id number] Page 5 of 10 Lidocaine 4% Topical Solution Lidocaine 4% Topical Solution N/A Pain Control: [facility] N/A Tissue Debrided: Non-Viable Tissue Non-Viable Tissue N/A Level: 1.7 1.13 N/A Debridement A (sq cm): rea Curette Curette N/A Instrument: Minimum Minimum N/A Bleeding: Pressure Pressure N/A Hemostasis A chieved: Procedure was tolerated well Procedure was tolerated well N/A Debridement Treatment  Response: 1.8x1.2x0.2 1.2x1.2x0.3 N/A Post Debridement Measurements L x W x D (cm) 0.339 0.339 N/A Post Debridement Volume: (cm) No Abnormalities Noted No Abnormalities Noted N/A Periwound Skin Texture: Dry/Scaly: Yes Dry/Scaly: Yes N/A Periwound Skin Moisture: No Abnormalities Noted No Abnormalities Noted N/A Periwound Skin Color: Debridement Debridement N/A Procedures Performed: Treatment Notes Wound #1 (Calcaneus) Wound Laterality: Left, Posterior Cleanser Peri-Wound Care Topical Primary Dressing IODOFLEX 0.9% Cadexomer Iodine Pad 4x6 cm Discharge Instruction: Apply to wound bed as instructed Secondary Dressing ALLEVYN Heel 4 1/2in x 5 1/2in / 10.5cm x 13.5cm Discharge Instruction: Apply over primary dressing as directed. Secured With [employer], 4.5x3.1 (in/yd) Discharge Instruction: Secure with Kerlix as directed. 35M Medipore H Soft Cloth Surgical T ape, 4 x 10 (in/yd) Discharge Instruction: Secure with tape as directed. Compression Wrap Compression Stockings Add-Ons Wound #2 (Calcaneus) Wound Laterality: Right, Posterior Cleanser Peri-Wound Care Topical Primary Dressing IODOFLEX 0.9% Cadexomer Iodine Pad 4x6 cm Discharge Instruction: Apply to wound bed as instructed Secondary Dressing ALLEVYN Heel 4 1/2in x 5 1/2in / 10.5cm x 13.5cm Discharge Instruction: Apply over primary dressing as directed. Secured With [employer], 4.5x3.1 (in/yd) Discharge Instruction: Secure with Kerlix as directed. 35M Medipore H Soft Cloth Surgical T ape, 4 x 10 (in/yd) Discharge Instruction: Secure with tape as directed. Compression Wrap Compression Stockings Add-Ons Electronic Signature(s) Signed: 02/27/2023 9:33:55 AM By: Billy Najjar MD [name] ([id number]) [id number] Page 6 of 10 Entered By: [name] on 02/26/2023  17:16:02 -------------------------------------------------------------------------------- Multi-Disciplinary Care Plan Details Patient Name: Date of Service: [name], [name] 02/26/2023 1:15 PM Medical Record Number: [id number] Patient Account Number: [id number] Date of Birth/Sex: Treating RN: 06/30/1945 (77 y.o. [name] Primary Care Robbie Rideaux: [name] Other Clinician: Referring Eliz Nigg: Treating Ercil Cassis/Extender: [name], [place] in Treatment: 0 Active Inactive Wound/Skin Impairment Nursing Diagnoses: Knowledge deficit related to smoking impact on wound healing Goals: Patient/caregiver will verbalize understanding of skin care regimen Date Initiated: 02/26/2023 Target Resolution Date: 04/20/2023 Goal Status: Active Interventions: Assess patient/caregiver ability to obtain necessary supplies Assess patient/caregiver ability to perform ulcer/skin care regimen upon admission and as needed Assess ulceration(s) every visit Provide education on ulcer and skin care Screen for HBO Treatment Activities: Consult for HBO : 02/26/2023 Skin care regimen initiated : 02/26/2023 Topical wound management initiated : 02/26/2023 Notes: Electronic Signature(s) Signed: 02/28/2023 4:20:59 PM By: [name] Entered By: [name] on 02/26/2023 13:59:05 -------------------------------------------------------------------------------- Pain Assessment Details Patient Name: Date of Service: [name], [name] 02/26/2023 1:15 PM Medical Record Number: [id number] Patient Account Number: [id number] Date of Birth/Sex: Treating RN: 1945/11/07 (77 y.o. [name] Primary Care Jeffey Janssen: [name] Other Clinician: Referring Hava Massingale: Treating Loyde Orth/Extender: [name], [place] in Treatment: 0 Active Problems Location of Pain Severity and Description of Pain Patient Has Paino No Site Locations JERRIC, OYEN ([id number]) [id number] Page 7 of 10 Pain Management and Medication Current Pain Management: Electronic Signature(s) Signed:  ANKIT, DEGREGORIO ([id number]) [id number] Page 1 of 10 Visit Report for 02/26/2023 Allergy List Details Patient Name: Date of Service: [name], [name] 02/26/2023 1:15 PM Medical Record Number: [id number] Patient Account Number: [id number] Date of Birth/Sex: Treating RN: 02-Dec-1945 (77 y.o. [name] Primary Care Bernardo Brayman: [name] Other Clinician: Referring Shalamar Plourde: Treating Fionn Stracke/Extender: [name] [place] in Treatment: 0 Allergies Active Allergies Iodinated Contrast Media quinine Allergy Notes Electronic Signature(s) Signed: 02/28/2023 4:20:59 PM By: [name] Entered By: [name] on 02/26/2023 13:27:58 -------------------------------------------------------------------------------- Arrival Information Details Patient Name: Date of Service: [name], [name] 02/26/2023 1:15 PM Medical Record Number: [id number] Patient Account Number: [id number] Date of Birth/Sex: Treating RN: 1945-11-03 (77 y.o. [name] Primary Care Der Gagliano: [name] Other Clinician: Referring Omya Winfield: Treating Madine Sarr/Extender: [name] [place] in Treatment: 0 Visit Information Patient Arrived: [name] Arrival Time: 13:24 Accompanied By: wife Transfer Assistance: None Patient Identification Verified: Yes Secondary Verification Process Completed: Yes Patient Requires Transmission-Based Precautions: No Patient Has Alerts: No Electronic Signature(s) Signed: 02/28/2023 4:20:59 PM By: [name] Entered By: [name] on 02/26/2023 13:25:03 Clinic Level of Care Assessment Details -------------------------------------------------------------------------------- [name] ([id number]) [id number] Page 2 of 10 Patient Name: Date of Service: [name], [name] 02/26/2023 1:15 PM Medical Record Number: [id number] Patient Account  Number: [id number] Date of Birth/Sex: Treating RN: 08/24/1945 (77 y.o. [name] Primary Care Sharelle Burditt: [name] Other Clinician: Referring Klayton Monie: Treating Baby Gieger/Extender: [name] [place] in Treatment: 0 Clinic Level of Care Assessment Items TOOL 1 Quantity Score X- 1 0 Use when EandM and Procedure is performed on INITIAL visit ASSESSMENTS - Nursing Assessment / Reassessment X- 1 20 General Physical Exam (combine w/ comprehensive assessment (listed just below) when performed on new pt. evals) X- 1 25 Comprehensive Assessment (HX, ROS, Risk Assessments, Wounds Hx, etc.) ASSESSMENTS - Wound and Skin Assessment / Reassessment X- 1 10 Dermatologic / Skin Assessment (not related to wound area) ASSESSMENTS - Ostomy and/or Continence Assessment and Care []  - 0 Incontinence Assessment and Management []  - 0 Ostomy Care Assessment and Management (repouching, etc.) PROCESS - Coordination of Care []  - 0 Simple Patient / Family Education for ongoing care X- 1 20 Complex (extensive) Patient / Family Education for ongoing care X- 1 10 Staff obtains Chiropractor, Records, T Results / Process Orders est []  - 0 Staff telephones HHA, Nursing Homes / Clarify orders / etc []  - 0 Routine Transfer to another Facility (non-emergent condition) []  - 0 Routine Hospital Admission (non-emergent condition) X- 1 15 New Admissions / Manufacturing engineer / Ordering NPWT Apligraf, etc. , []  - 0 Emergency Hospital Admission (emergent condition) PROCESS - Special Needs []  - 0 Pediatric / Minor Patient Management []  - 0 Isolation Patient Management []  - 0 Hearing / Language / Visual special needs []  - 0 Assessment of Community assistance (transportation, D/C planning, etc.) []  - 0 Additional assistance / Altered mentation []  - 0 Support Surface(s) Assessment (bed, cushion, seat, etc.) INTERVENTIONS - Miscellaneous []  - 0 External ear exam []  -  0 Patient Transfer (multiple staff / Nurse, adult / Similar devices) []  - 0 Simple Staple / Suture removal (25 or less) []  - 0 Complex Staple / Suture removal (26 or more) []  - 0 Hypo/Hyperglycemic Management (do not check if billed separately) []  - 0 Ankle / Brachial Index (ABI) - do not check if billed separately Has the patient been seen at the  Red, Pink Fat Layer (Subcutaneous Tissue) Exposed: Yes Necrotic Amount: Medium (34-66%) Necrotic Quality: Adherent Slough Periwound Skin Texture Texture Color No Abnormalities Noted: Yes No Abnormalities Noted: Yes Moisture No Abnormalities Noted: No MARSHEL, GOLUBSKI ([id number]) [id number] Page 10 of 10 Dry / Scaly: Yes Treatment Notes Wound #2 (Calcaneus) Wound Laterality: Right, Posterior Cleanser Peri-Wound Care Topical Primary Dressing IODOFLEX 0.9% Cadexomer Iodine Pad 4x6 cm Discharge Instruction: Apply to wound bed as instructed Secondary Dressing ALLEVYN Heel 4 1/2in x 5 1/2in / 10.5cm x 13.5cm Discharge Instruction: Apply over primary dressing as directed. Secured With [employer], 4.5x3.1 (in/yd) Discharge Instruction: Secure with Kerlix as directed. 76M Medipore H Soft Cloth  Surgical T ape, 4 x 10 (in/yd) Discharge Instruction: Secure with tape as directed. Compression Wrap Compression Stockings Add-Ons Electronic Signature(s) Signed: 02/28/2023 4:20:59 PM By: [name] Entered By: [name] on 02/26/2023 13:55:40 -------------------------------------------------------------------------------- Vitals Details Patient Name: Date of Service: [name], [name] 02/26/2023 1:15 PM Medical Record Number: [id number] Patient Account Number: [id number] Date of Birth/Sex: Treating RN: 1946-01-18 (77 y.o. [name] Primary Care Jakhai Fant: [name] Other Clinician: Referring Cleotha Whalin: Treating Trystin Hargrove/Extender: [name] [place] in Treatment: 0 Vital Signs Time Taken: 13:25 Temperature (F): 97.7 Height (in): 70 Pulse (bpm): 61 Weight (lbs): 196 Respiratory Rate (breaths/min): 18 Body Mass Index (BMI): 28.1 Blood Pressure (mmHg): 156/72 Capillary Blood Glucose (mg/dl): 841 Reference Range: 80 - 120 mg / dl Electronic Signature(s) Signed: 02/28/2023 4:20:59 PM By: [name] Entered By: [name] on 02/26/2023 13:27:08

## 2023-02-28 NOTE — Progress Notes (Signed)
[name], [name] ([id number]) [id number] Page 1 of 10 Visit Report for 02/26/2023 Chief Complaint Document Details Patient Name: Date of Service: [name], [name] 02/26/2023 1:15 PM Medical Record Number: [id number] Patient Account Number: [id number] Date of Birth/Sex: Treating RN: Nov 16, 1945 (77 y.o. M) Primary Care Provider: [name] Other Clinician: Referring Provider: Treating Provider/Extender: [name] [place] [facility] in Treatment: 0 Information Obtained from: Patient Chief Complaint 02/26/2023. Patient is here for review of wounds on his bilateral heel tips Electronic Signature(s) Signed: 02/27/2023 9:33:55 AM By: Billy Najjar [name] Entered By: [name] on 02/26/2023 17:18:57 -------------------------------------------------------------------------------- Debridement Details Patient Name: Date of Service: [name], [name] 02/26/2023 1:15 PM Medical Record Number: [id number] Patient Account Number: [id number] Date of Birth/Sex: Treating RN: 05/13/1946 (77 y.o. M) Primary Care Provider: [name] Other Clinician: Referring Provider: Treating Provider/Extender: [name] [place] [facility] in Treatment: 0 Debridement Performed for Assessment: Wound #1 Left,Posterior Calcaneus Performed By: Physician [name] The following information was scribed by: [name] The information was scribed for: [name] Debridement Type: Debridement Severity of Tissue Pre Debridement: Fat layer exposed Level of Consciousness (Pre-procedure): Awake and Alert Pre-procedure Verification/Time Out Yes - 14:25 Taken: Start Time: 14:26 Pain Control: Lidocaine 4% T opical Solution Percent of Wound Bed Debrided: 100% T Area Debrided (cm): otal 1.7 Tissue and other material debrided: Non-Viable, Slough, Subcutaneous, Slough Level: Skin/Subcutaneous Tissue Debridement Description:  Excisional Instrument: Curette Bleeding: Minimum Hemostasis Achieved: Pressure Response to Treatment: Procedure was tolerated well Level of Consciousness (Post- Awake and Alert procedure): Post Debridement Measurements of Total Wound Length: (cm) 1.8 Width: (cm) 1.2 Depth: (cm) 0.2 Volume: (cm) 0.339 Character of Wound/Ulcer Post Debridement: Stable Severity of Tissue Post Debridement: Fat layer exposed [name] ([id number]) [id number] Page 2 of 10 Post Procedure Diagnosis Same as Pre-procedure Electronic Signature(s) Signed: 02/27/2023 9:33:55 AM By: Billy Najjar [name] Entered By: [name] on 02/26/2023 17:26:07 -------------------------------------------------------------------------------- Debridement Details Patient Name: Date of Service: [name], [name] 02/26/2023 1:15 PM Medical Record Number: [id number] Patient Account Number: [id number] Date of Birth/Sex: Treating RN: 30-Mar-1946 (77 y.o. M) Primary Care Provider: [name] Other Clinician: Referring Provider: Treating Provider/Extender: [name] [place] [facility] in Treatment: 0 Debridement Performed for Assessment: Wound #2 Right,Posterior Calcaneus Performed By: Physician [name] The following information was scribed by: [name] The information was scribed for: [name] Debridement Type: Debridement Severity of Tissue Pre Debridement: Fat layer exposed Level of Consciousness (Pre-procedure): Awake and Alert Pre-procedure Verification/Time Out Yes - 14:25 Taken: Start Time: 14:26 Pain Control: Lidocaine 4% T opical Solution Percent of Wound Bed Debrided: 100% T Area Debrided (cm): otal 1.13 Tissue and other material debrided: Non-Viable, Slough, Subcutaneous, Slough Level: Skin/Subcutaneous Tissue Debridement Description: Excisional Instrument: Curette Bleeding: Minimum Hemostasis Achieved: Pressure Response  to Treatment: Procedure was tolerated well Level of Consciousness (Post- Awake and Alert procedure): Post Debridement Measurements of Total Wound Length: (cm) 1.2 Width: (cm) 1.2 Depth: (cm) 0.3 Volume: (cm) 0.339 Character of Wound/Ulcer Post Debridement: Stable Severity of Tissue Post Debridement: Fat layer exposed Post Procedure Diagnosis Same as Pre-procedure Electronic Signature(s) Signed: 02/27/2023 9:33:55 AM By: Billy Najjar [name] Entered By: [name] on 02/26/2023 17:26:15 -------------------------------------------------------------------------------- HPI Details Patient Name: Date of Service: [name], [name] 02/26/2023 1:15 PM [name] ([id number]) [id number] Page 3 of 10 Medical Record Number: [id number] Patient Account Number: [id number] Date of Birth/Sex: Treating RN: 16-Oct-1945 (77 y.o.  [name] Entered By: [name] on 02/26/2023 13:28:13 -------------------------------------------------------------------------------- SuperBill Details Patient Name: Date of Service: [name], [name] 02/26/2023 Medical Record Number: [id number] Patient Account Number: [id number] Date of Birth/Sex: Treating RN: [birth year] (77 y.o. 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 E11.42 Type 2 diabetes mellitus with diabetic polyneuropathy E11.51 Type 2 diabetes mellitus with diabetic peripheral angiopathy without gangrene Facility Procedures : CPT4 Code: 56213086 Description: WOUND CARE VISIT-LEV 3 NEW PT Modifier: 25 Quantity: 1 Physician Procedures : CPT4 Code Description Modifier [id number] - WC PHYS LEVEL 4 - NEW PT ICD-10 Diagnosis Description 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  M) Primary Care Provider: [name] Other Clinician: Referring Provider: Treating Provider/Extender: [name] [place] [facility] in Treatment: 0 History of Present Illness HPI Description: ADMISSION 02/26/2023 Patient is Billy 77 year old man with type 2 diabetes. He has had Billy 47-month history of wounds on his bilateral heels just above the heel tips. They have been using Iodoflex/Iodosorb ointment. The patient has Billy history of PAD and has Billy SFA stent on the right. He also also status post left iliac stent. Furthermore he has Billy history of carotid artery stenosis status post right carotid endarterectomy and Billy left subclavian artery stenosis. His wound started on his blisters on the heel that of open to wounds with minimal but some depth right greater than left. He also has advanced diabetic neuropathy. The patient is cared for through the [place] and has been referred here for hyperbaric oxygen treatment through his vascular surgeon. Past medical history includes hypertension, hyperlipidemia, type 2 diabetes chronic kidney  disease stage IIIb, carotid artery stenosis status post right carotid endarterectomy, left subclavian artery stenosis ABIs done in our clinic were 1.11 on the right and 0.75 on the left these were remarkably similar to what was quoted during the vascular surgery appointment from 02/05/2023. Electronic Signature(s) Signed: 02/27/2023 9:33:55 AM By: Billy Najjar [name] Entered By: [name] on 02/26/2023 17:22:58 -------------------------------------------------------------------------------- Physical Exam Details Patient Name: Date of Service: [name], [name] 02/26/2023 1:15 PM Medical Record Number: [id number] Patient Account Number: [id number] Date of Birth/Sex: Treating RN: [birth year] (77 y.o. M) Primary Care Provider: [name] Other Clinician: Referring Provider: Treating Provider/Extender: Kath[facility] in Treatment: 0 Constitutional Patient is hypertensive. Pulse regular and within target range for patient.Marland Kitchen Respirations regular, non-labored and within target range.. Temperature is normal and within the target range for the patient.. He appears well. He is able to respond to questions although his wife does most of the talking.. Cardiovascular Interestingly his dorsalis pedis and posterior tibial pulses were palpable certainly not robust but palpable. Neurological Absent to the monofilament. Notes Wound exam; the patient has bilateral heel wounds. Slightly laterally on the left. More centrally on the right. Thick callus and skin around the wound margins and some debris on the surface debrided with Billy #3 curette minimal bleeding. The wounds clean up remarkably well. These do not probe to bone and are not associated with any obvious soft tissue infection Electronic Signature(s) Signed: 02/27/2023 9:33:55 AM By: Billy Najjar [name] Entered By: [name] on 02/26/2023  17:25:44 -------------------------------------------------------------------------------- Physician Orders Details Patient Name: Date of Service: [name], [name] 02/26/2023 1:15 PM Medical Record Number: [id number] Patient Account Number: [id number] JADIN, CREQUE ([id number]) [id number] Page 4 of 10 Date of Birth/Sex: Treating RN: 09/12/1945 (77 y.o. [name] Primary Care Provider: Other Clinician: [name] Referring Provider: Treating Provider/Extender: [name] [place] [facility] in Treatment: 0 The following information was scribed by: [name] The information was scribed for: [name] Verbal / Phone Orders: No Diagnosis Coding Follow-up Appointments ppointment in 1 week. - Please make appointment. Return Billy Anesthetic (In clinic) Topical Lidocaine 4% applied to wound bed - In clinic Bathing/ Shower/ Hygiene May shower with protection but do not get wound dressing(s) wet. Protect dressing(s) with water repellant cover (for example, large plastic bag) or Billy cast cover and may then take shower. Off-Loading Wound #1 Left,Posterior Calcaneus Wedge shoe to: - Off loading shoes bilaterally to alleviate pressure from heels. Turn and reposition every 2 hours  [name] Entered By: [name] on 02/26/2023 13:28:13 -------------------------------------------------------------------------------- SuperBill Details Patient Name: Date of Service: [name], [name] 02/26/2023 Medical Record Number: [id number] Patient Account Number: [id number] Date of Birth/Sex: Treating RN: [birth year] (77 y.o. 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 E11.42 Type 2 diabetes mellitus with diabetic polyneuropathy E11.51 Type 2 diabetes mellitus with diabetic peripheral angiopathy without gangrene Facility Procedures : CPT4 Code: 56213086 Description: WOUND CARE VISIT-LEV 3 NEW PT Modifier: 25 Quantity: 1 Physician Procedures : CPT4 Code Description Modifier [id number] - WC PHYS LEVEL 4 - NEW PT ICD-10 Diagnosis Description 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 02/26/2023 1:15 PM Medical Record Number: [id number] Patient Account Number: [id number] Date of Birth/Sex: Treating RN: Nov 16, 1945 (77 y.o. M) Primary Care Provider: [name] Other Clinician: Referring Provider: Treating Provider/Extender: [name] [place] [facility] in Treatment: 0 Information Obtained from: Patient Chief Complaint 02/26/2023. Patient is here for review of wounds on his bilateral heel tips Electronic Signature(s) Signed: 02/27/2023 9:33:55 AM By: Billy Najjar [name] Entered By: [name] on 02/26/2023 17:18:57 -------------------------------------------------------------------------------- Debridement Details Patient Name: Date of Service: [name], [name] 02/26/2023 1:15 PM Medical Record Number: [id number] Patient Account Number: [id number] Date of Birth/Sex: Treating RN: 05/13/1946 (77 y.o. M) Primary Care Provider: [name] Other Clinician: Referring Provider: Treating Provider/Extender: [name] [place] [facility] in Treatment: 0 Debridement Performed for Assessment: Wound #1 Left,Posterior Calcaneus Performed By: Physician [name] The following information was scribed by: [name] The information was scribed for: [name] Debridement Type: Debridement Severity of Tissue Pre Debridement: Fat layer exposed Level of Consciousness (Pre-procedure): Awake and Alert Pre-procedure Verification/Time Out Yes - 14:25 Taken: Start Time: 14:26 Pain Control: Lidocaine 4% T opical Solution Percent of Wound Bed Debrided: 100% T Area Debrided (cm): otal 1.7 Tissue and other material debrided: Non-Viable, Slough, Subcutaneous, Slough Level: Skin/Subcutaneous Tissue Debridement Description:  Excisional Instrument: Curette Bleeding: Minimum Hemostasis Achieved: Pressure Response to Treatment: Procedure was tolerated well Level of Consciousness (Post- Awake and Alert procedure): Post Debridement Measurements of Total Wound Length: (cm) 1.8 Width: (cm) 1.2 Depth: (cm) 0.2 Volume: (cm) 0.339 Character of Wound/Ulcer Post Debridement: Stable Severity of Tissue Post Debridement: Fat layer exposed [name] ([id number]) [id number] Page 2 of 10 Post Procedure Diagnosis Same as Pre-procedure Electronic Signature(s) Signed: 02/27/2023 9:33:55 AM By: Billy Najjar [name] Entered By: [name] on 02/26/2023 17:26:07 -------------------------------------------------------------------------------- Debridement Details Patient Name: Date of Service: [name], [name] 02/26/2023 1:15 PM Medical Record Number: [id number] Patient Account Number: [id number] Date of Birth/Sex: Treating RN: 30-Mar-1946 (77 y.o. M) Primary Care Provider: [name] Other Clinician: Referring Provider: Treating Provider/Extender: [name] [place] [facility] in Treatment: 0 Debridement Performed for Assessment: Wound #2 Right,Posterior Calcaneus Performed By: Physician [name] The following information was scribed by: [name] The information was scribed for: [name] Debridement Type: Debridement Severity of Tissue Pre Debridement: Fat layer exposed Level of Consciousness (Pre-procedure): Awake and Alert Pre-procedure Verification/Time Out Yes - 14:25 Taken: Start Time: 14:26 Pain Control: Lidocaine 4% T opical Solution Percent of Wound Bed Debrided: 100% T Area Debrided (cm): otal 1.13 Tissue and other material debrided: Non-Viable, Slough, Subcutaneous, Slough Level: Skin/Subcutaneous Tissue Debridement Description: Excisional Instrument: Curette Bleeding: Minimum Hemostasis Achieved: Pressure Response  to Treatment: Procedure was tolerated well Level of Consciousness (Post- Awake and Alert procedure): Post Debridement Measurements of Total Wound Length: (cm) 1.2 Width: (cm) 1.2 Depth: (cm) 0.3 Volume: (cm) 0.339 Character of Wound/Ulcer Post Debridement: Stable Severity of Tissue Post Debridement: Fat layer exposed Post Procedure Diagnosis Same as Pre-procedure Electronic Signature(s) Signed: 02/27/2023 9:33:55 AM By: Billy Najjar [name] Entered By: [name] on 02/26/2023 17:26:15 -------------------------------------------------------------------------------- HPI Details Patient Name: Date of Service: [name], [name] 02/26/2023 1:15 PM [name] ([id number]) [id number] Page 3 of 10 Medical Record Number: [id number] Patient Account Number: [id number] Date of Birth/Sex: Treating RN: 16-Oct-1945 (77 y.o.  the hospital definition of Billy critical result, follow hospital policy. Billy. Give patient an 8 ounce Glucerna Shake, an 8 ounce Ensure, or 8 ounces of Billy Glucerna/Ensure equivalent dietary supplement*. B. Wait 30 minutes. If result is 71 mg/dl to 784 mg/dl: C. Retest patients capillary blood glucose (CBG). D. If result greater than or equal to 110 mg/dl, proceed with HBO. If result less than 110 mg/dl, notify HBO physician and consider holding HBO. If result is 131 mg/dl to 696 mg/dl: Billy. Proceed with HBO. Billy. Notify HBO physician and await physician orders. B. It is recommended to hold HBO and do If result is 250 mg/dl or  greater: blood/urine ketone testing. C. If the result meets the hospital definition of Billy critical result, follow hospital policy. POST-HBO GLYCEMIA INTERVENTIONS ACTION INTERVENTION Obtain post HBO capillary blood glucose (ensure 1 physician order is in chart). Billy. Notify HBO physician and await physician orders. 2 If result is 70 mg/dl or below: B. If the result meets the hospital definition of Billy critical result, follow hospital policy. Billy. Give patient an 8 ounce Glucerna Shake, an 8 ounce Ensure, or 8 ounces of Billy Glucerna/Ensure equivalent dietary supplement*. B. Wait 15 minutes for symptoms of If result is 71 mg/dl to 295 mg/dl: hypoglycemia (i.e. nervousness, anxiety, sweating, chills, clamminess, irritability, confusion, tachycardia or dizziness). C. If patient asymptomatic, discharge patient. If patient symptomatic, repeat capillary blood glucose (CBG) and notify HBO physician. If result is 101 mg/dl to 284 mg/dl: Billy. Discharge patient. Billy. Notify HBO physician and await physician orders. B. It is recommended to do blood/urine ketone If result is 250 mg/dl or greater: testing. C. If the result meets the hospital definition of Billy critical result, follow hospital policy. *Juice or candies are NOT equivalent products. If patient refuses the Glucerna or Ensure, please consult the hospital dietitian for an appropriate substitute. Electronic Signature(s) Signed: 02/26/2023 5:32:21 PM By: Billy Najjar [name] Entered By: [name] on 02/26/2023 17:32:21 -------------------------------------------------------------------------------- Problem List Details Patient Name: Date of Service: [name], [name] 02/26/2023 1:15 PM Medical Record Number: [id number] Patient Account Number: [id number] Date of Birth/Sex: Treating RN: March 28, 1946 (77 y.o. 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 02/26/2023 1:15 PM Medical Record Number: [id number] Patient Account Number: [id number] Date of Birth/Sex: Treating RN: 06-Apr-1946 (77 y.o. M) Primary Care Provider: [name] Other Clinician: Referring Provider: Treating Provider/Extender: [name] [place] [facility] in Treatment: 0 Subjective Chief Complaint Information obtained from Patient 02/26/2023. Patient is here for review of wounds on his bilateral heel tips History of Present Illness (HPI) ADMISSION 02/26/2023 Patient is Billy 77 year old man with type 2 diabetes. He has had Billy 64-month history of wounds on his bilateral  M) Primary Care Provider: [name] Other Clinician: Referring Provider: Treating Provider/Extender: [name] [place] [facility] in Treatment: 0 History of Present Illness HPI Description: ADMISSION 02/26/2023 Patient is Billy 77 year old man with type 2 diabetes. He has had Billy 47-month history of wounds on his bilateral heels just above the heel tips. They have been using Iodoflex/Iodosorb ointment. The patient has Billy history of PAD and has Billy SFA stent on the right. He also also status post left iliac stent. Furthermore he has Billy history of carotid artery stenosis status post right carotid endarterectomy and Billy left subclavian artery stenosis. His wound started on his blisters on the heel that of open to wounds with minimal but some depth right greater than left. He also has advanced diabetic neuropathy. The patient is cared for through the [place] and has been referred here for hyperbaric oxygen treatment through his vascular surgeon. Past medical history includes hypertension, hyperlipidemia, type 2 diabetes chronic kidney  disease stage IIIb, carotid artery stenosis status post right carotid endarterectomy, left subclavian artery stenosis ABIs done in our clinic were 1.11 on the right and 0.75 on the left these were remarkably similar to what was quoted during the vascular surgery appointment from 02/05/2023. Electronic Signature(s) Signed: 02/27/2023 9:33:55 AM By: Billy Najjar [name] Entered By: [name] on 02/26/2023 17:22:58 -------------------------------------------------------------------------------- Physical Exam Details Patient Name: Date of Service: [name], [name] 02/26/2023 1:15 PM Medical Record Number: [id number] Patient Account Number: [id number] Date of Birth/Sex: Treating RN: [birth year] (77 y.o. M) Primary Care Provider: [name] Other Clinician: Referring Provider: Treating Provider/Extender: Kathaleen Grinder in Treatment: 0 Constitutional Patient is hypertensive. Pulse regular and within target range for patient.Marland Kitchen Respirations regular, non-labored and within target range.. Temperature is normal and within the target range for the patient.. He appears well. He is able to respond to questions although his wife does most of the talking.. Cardiovascular Interestingly his dorsalis pedis and posterior tibial pulses were palpable certainly not robust but palpable. Neurological Absent to the monofilament. Notes Wound exam; the patient has bilateral heel wounds. Slightly laterally on the left. More centrally on the right. Thick callus and skin around the wound margins and some debris on the surface debrided with Billy #3 curette minimal bleeding. The wounds clean up remarkably well. These do not probe to bone and are not associated with any obvious soft tissue infection Electronic Signature(s) Signed: 02/27/2023 9:33:55 AM By: Billy Najjar [name] Entered By: [name] on 02/26/2023  17:25:44 -------------------------------------------------------------------------------- Physician Orders Details Patient Name: Date of Service: [name], [name] 02/26/2023 1:15 PM Medical Record Number: [id number] Patient Account Number: [id number] JADIN, CREQUE ([id number]) [id number] Page 4 of 10 Date of Birth/Sex: Treating RN: 09/12/1945 (77 y.o. [name] Primary Care Provider: Other Clinician: [name] Referring Provider: Treating Provider/Extender: [name] [facility] [place] [facility] in Treatment: 0 The following information was scribed by: [name] The information was scribed for: [name] Verbal / Phone Orders: No Diagnosis Coding Follow-up Appointments ppointment in 1 week. - Please make appointment. Return Billy Anesthetic (In clinic) Topical Lidocaine 4% applied to wound bed - In clinic Bathing/ Shower/ Hygiene May shower with protection but do not get wound dressing(s) wet. Protect dressing(s) with water repellant cover (for example, large plastic bag) or Billy cast cover and may then take shower. Off-Loading Wound #1 Left,Posterior Calcaneus Wedge shoe to: - Off loading shoes bilaterally to alleviate pressure from heels. Turn and reposition every 2 hours  [name] Entered By: [name] on 02/26/2023 13:28:13 -------------------------------------------------------------------------------- SuperBill Details Patient Name: Date of Service: [name], [name] 02/26/2023 Medical Record Number: [id number] Patient Account Number: [id number] Date of Birth/Sex: Treating RN: [birth year] (77 y.o. 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 E11.42 Type 2 diabetes mellitus with diabetic polyneuropathy E11.51 Type 2 diabetes mellitus with diabetic peripheral angiopathy without gangrene Facility Procedures : CPT4 Code: [id number] Description: WOUND CARE VISIT-LEV 3 NEW PT Modifier: 25 Quantity: 1 Physician Procedures : CPT4 Code Description Modifier [id number] - WC PHYS LEVEL 4 - NEW PT ICD-10 Diagnosis Description 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

## 2023-02-28 NOTE — Progress Notes (Signed)
Furniture 0 No Intravenous therapy Access/Saline/Heparin Lock 0 No Gait/Transferring Normal/ bed rest/ wheelchair 0 No Weak (short steps with or without shuffle, stooped but able to lift head while walking, may seek 0 No support from furniture) Impaired (short steps with shuffle, may have difficulty arising from chair, head down, impaired 0 No balance) Mental Status Oriented to own ability 0 No Electronic Signature(s) Signed: 02/28/2023 4:20:59 PM By: [name] Entered By: [name] on 02/26/2023 13:31:08 -------------------------------------------------------------------------------- Foot Assessment Details Patient Name: Date of Service: [name] LD, [name] 02/26/2023 1:15 PM Medical Record Number: [id number] Patient Account Number: [id number] Date of Birth/Sex: Treating RN: 12/29/1945 (77 y.o. [name] Primary Care Matsue Strom: [name] Other Clinician: Referring Shiva Karis: Treating Felix Meras/Extender: [name] [place] [facility] in Treatment: 0 Foot Assessment Items Site Locations + = Sensation present, - = Sensation absent, C = Callus, U = Ulcer R = Redness, W = Warmth, M = Maceration, PU = Pre-ulcerative lesion F = Fissure, S = Swelling, D =  Dryness Assessment Right: Left: Other Deformity: No No Prior Foot Ulcer: No No Prior Amputation: No No Charcot Joint: No No Ambulatory Status: Ambulatory With Help Assistance Device: Walker GaitTREYVION, [name] ([id number]) [id number] Page 4 of 4 Electronic Signature(s) Signed: 02/28/2023 4:20:59 PM By: [name] Entered By: [name] on 02/26/2023 13:35:49 -------------------------------------------------------------------------------- Nutrition Risk Screening Details Patient Name: Date of Service: [name], [name] 02/26/2023 1:15 PM Medical Record Number: [id number] Patient Account Number: [id number] Date of Birth/Sex: Treating RN: [birth year] (77 y.o. [name] Primary Care Cheyenne Bordeaux: [name] Other Clinician: Referring Jakerria Kingbird: Treating Lauriel Helin/Extender: [name] [place] [facility] in Treatment: 0 Height (in): 70 Weight (lbs): 196 Body Mass Index (BMI): 28.1 Nutrition Risk Screening Items Score Screening NUTRITION RISK SCREEN: I have an illness or condition that made me change the kind and/or amount of food I eat 0 No I eat fewer than two meals per day 0 No I eat few fruits and vegetables, or milk products 0 No I have three or more drinks of beer, liquor or wine almost every day 0 No I have tooth or mouth problems that make it hard for me to eat 0 No I don't always have enough money to buy the food I need 0 No I eat alone most of the time 0 No I take three or more different prescribed or over-the-counter drugs a day 0 No Without wanting to, I have lost or gained 10 pounds in the last six months 0 No I am not always physically able to shop, cook and/or feed myself 0 No Nutrition Protocols Good Risk Protocol Moderate Risk Protocol High Risk Proctocol Risk Level: Good Risk Score: 0 Electronic Signature(s) Signed: 02/28/2023 4:20:59 PM By: [name] Entered By: [name] on 02/26/2023 13:31:39  Billy, [name] ([id number]) [id number] Page 1 of 4 Visit Report for 02/26/2023 Abuse Risk Screen Details Patient Name: Date of Service: [name], [name] 02/26/2023 1:15 PM Medical Record Number: [id number] Patient Account Number: [id number] Date of Birth/Sex: Treating RN: 08/21/1945 (77 y.o. [name] Primary Care Marites Nath: [name] Other Clinician: Referring Tahnee Cifuentes: Treating Krish Bailly/Extender: [name] [place] [facility] in Treatment: 0 Abuse Risk Screen Items Answer ABUSE RISK SCREEN: Has anyone close to you tried to hurt or harm you recentlyo No Do you feel uncomfortable with anyone in your familyo No Has anyone forced you do things that you didnt want to doo No Electronic Signature(s) Signed: 02/28/2023 4:20:59 PM By: [name] Entered By: [name] on 02/26/2023 13:28:39 -------------------------------------------------------------------------------- Activities of Daily Living Details Patient Name: Date of Service: [name], [name] 02/26/2023 1:15 PM Medical Record Number: [id number] Patient Account Number: [id number] Date of Birth/Sex: Treating RN: 1945/12/20 (77 y.o. [name] Primary Care Sharissa Brierley: [name] Other Clinician: Referring Vi Whitesel: Treating Kahleel Fadeley/Extender: [name] [place] [facility] in Treatment: 0 Activities of Daily Living Items Answer Activities of Daily Living (Please select one for each item) Drive Automobile Not Able T Medications ake Completely Able Use T elephone Completely Able Care for Appearance Completely Able Use T oilet Completely Able Bath / Shower Completely Able Dress Self Completely Able Feed Self Completely Able Walk Completely Able Get In / Out Bed Completely Able Housework Completely Able Prepare Meals Completely Able Handle Money Completely Able Shop for Self Completely Able Electronic  Signature(s) Signed: 02/28/2023 4:20:59 PM By: [name] Entered By: [name] on 02/26/2023 13:29:56 [name] ([id number]) [id number] Page 2 of 4 -------------------------------------------------------------------------------- Education Screening Details Patient Name: Date of Service: [name], [name] 02/26/2023 1:15 PM Medical Record Number: [id number] Patient Account Number: [id number] Date of Birth/Sex: Treating RN: [birth year] (77 y.o. [name] Primary Care Auria Mckinlay: [name] Other Clinician: Referring Jaren Kearn: Treating Virginia Curl/Extender: [name] [place] [facility] in Treatment: 0 Learning Preferences/Education Level/Primary Language Highest Education Level: College or Above Preferred Language: English Cognitive Barrier Language Barrier: No Translator Needed: No Memory Deficit: No Emotional Barrier: No Cultural/Religious Beliefs Affecting Medical Care: No Physical Barrier Impaired Vision: Yes Glasses Impaired Hearing: No Decreased Hand dexterity: No Knowledge/Comprehension Knowledge Level: Medium Comprehension Level: Medium Ability to understand written instructions: Medium Ability to understand verbal instructions: Medium Motivation Anxiety Level: Calm Cooperation: Cooperative Education Importance: Acknowledges Need Interest in Health Problems: Asks Questions Perception: Coherent Willingness to Engage in Self-Management High Activities: Readiness to Engage in Self-Management High Activities: Electronic Signature(s) Signed: 02/28/2023 4:20:59 PM By: [name] Entered By: [name] on 02/26/2023 13:30:50 -------------------------------------------------------------------------------- Fall Risk Assessment Details Patient Name: Date of Service: [name], [name] 02/26/2023 1:15 PM Medical Record Number: [id number] Patient Account Number: [id number] Date of  Birth/Sex: Treating RN: [birth year] (77 y.o. [name] Primary Care Darene Nappi: [name] Other Clinician: Referring Clorene Nerio: Treating Borna Wessinger/Extender: [name] [place] [facility] in Treatment: 0 Fall Risk Assessment Items Have you had 2 or more falls in the last 12 monthso 0 Yes Have you had any fall that resulted in injury in the last 12 monthso 0 Yes [address] Billy, [name] ([id number]) [id number] Page 3 of 4 History of falling - immediate or within 3 months 0 No Secondary diagnosis (Do you have 2 or more medical diagnoseso) 0 No Ambulatory aid None/bed rest/wheelchair/nurse 0 No Crutches/cane/walker 0 No

## 2023-03-04 ENCOUNTER — Encounter (HOSPITAL_BASED_OUTPATIENT_CLINIC_OR_DEPARTMENT_OTHER): Payer: No Typology Code available for payment source | Admitting: Internal Medicine

## 2023-03-04 DIAGNOSIS — E11621 Type 2 diabetes mellitus with foot ulcer: Secondary | ICD-10-CM | POA: Diagnosis not present

## 2023-03-05 NOTE — Progress Notes (Signed)
Patient Name: Date of Service: [name], [name]. 03/04/2023 10:30 Billy M Medical Record Number: [id number] Patient Account Number: [id number] Date of Birth/Sex: Treating RN: 01-30-1946 (77 y.o. M) Primary Care Provider: [name] Other Clinician: Referring Provider: Treating Provider/Extender: Kathaleen Grinder in Treatment: 0 Constitutional Patient is hypertensive.. Pulse regular and within target range for patient.Marland Kitchen Respirations regular, non-labored and within target range.. Temperature is normal and within the target range for the patient.Marland Kitchen Appears in no distress. Notes Wound exam; the area on the left is small and superficial surface looks quite good on the left I remove nonviable subcutaneous tissue and eschar hemostasis with direct pressure. Neither 1 of these areas has surrounding soft tissue or skin infection. They do they do not probe to bone in either area Electronic Signature([name] Signed: 03/04/2023 4:41:28 PM By: [name] MD Entered By: [name] on 03/04/2023 08:56:09 -------------------------------------------------------------------------------- Physician Orders Details Patient Name: Date of Service: [name], [name]. 03/04/2023 10:30 Billy M Medical Record Number: [id number] Patient Account Number: [id number] Date of Birth/Sex: Treating RN: [birth year] (76 y.o. 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 - Dr. Leanord Hawking Thursday 03/14/2023 0815 (before hyperbarics) Return Billy ****Once ENT approves Dr. Christella Hartigan to start Hyperbarics can start Thursday or Monday 1000 slot be here at 0930***** ppointment in 2 weeks. - Dr. Leanord Hawking (front office to schedule appts) before or after Hyperbarics. Return Billy Return appointment in 3 weeks. - Dr. Leanord Hawking (front office to schedule appts) before or after Hyperbarics. Return appointment in 1 month. - Dr. Leanord Hawking (front office to schedule appts) before or after Hyperbarics. Other: - Patient to call wound center when ready to start hyperbarics. Anesthetic (In clinic) Topical Lidocaine 4% applied to wound bed - In clinic Bathing/ Shower/ Hygiene May shower with protection but do not get wound dressing([name] wet. Protect dressing([name] with water repellant cover (for example, large plastic bag) or Billy cast cover and may then take shower. Off-Loading Wound #1 Left,Posterior Calcaneus Wedge shoe to: - Off loading shoes bilaterally to alleviate pressure from heels. Turn and reposition every 2 hours Prevalon Boot - bilaterally at bedtime Wound #2 Right,Posterior Calcaneus Wedge shoe to: - Off loading shoes bilaterally to alleviate pressure from heels. Turn and reposition  every 2 hours Prevalon Boot - bilaterally at bedtime Hyperbaric Oxygen Therapy Wound #1 Left,Posterior Calcaneus Evaluate for HBO Therapy - Per patient, he has claustrophobia. Evaluate for HBO Therapy - Per patient, he has claustrophobia. If appropriate for treatment, begin HBOT per protocol: 2.0 ATA for 90 Minutes without Billy Breaks ir 2.0 ATA for 90 Minutes without Billy Breaks ir Total Number of Treatments: - 40 One treatments per day (delivered Monday through Friday unless otherwise specified in Special Instructions below): One treatments per day (delivered Monday through Friday unless otherwise specified in Special Instructions below): Finger stick Blood Glucose  result is 250 mg/dl or greater: blood/urine ketone testing. C. If the result meets the hospital definition of Billy critical result, follow hospital policy. POST-HBO GLYCEMIA INTERVENTIONS ACTION INTERVENTION Obtain post HBO capillary blood glucose (ensure 1 physician order is in chart). Billy. Notify HBO physician and await physician orders. 2 If result is 70 mg/dl or below: B. If the result meets the hospital definition of Billy critical result, follow hospital policy. Billy. Give patient an 8 ounce Glucerna Shake, an 8 ounce Ensure, or 8 ounces of Billy Glucerna/Ensure equivalent dietary supplement*. B. Wait 15 minutes for symptoms of If result is 71 mg/dl to 027 mg/dl: hypoglycemia (i.e. nervousness, anxiety, sweating, chills, clamminess, irritability, confusion, tachycardia or dizziness). C. If patient asymptomatic, discharge patient. If patient symptomatic, repeat capillary blood glucose (CBG) and notify HBO physician. If result is 101 mg/dl to 253 mg/dl: Billy. Discharge patient. Billy. Notify HBO physician and await physician orders. B. It is recommended to do blood/urine ketone If result is 250 mg/dl or greater: testing. C. If the result meets the hospital definition of Billy critical result, follow hospital policy. *Juice or candies are NOT equivalent products. If patient refuses the  Glucerna or Ensure, please consult the hospital dietitian for an appropriate substitute. RAWN, QUIROA ([id number]) [id number] Page 5 of 8 Electronic Signature([name] Signed: 03/04/2023 4:41:28 PM By: [name] MD Signed: 03/04/2023 5:13:31 PM By: Shawn Stall RN, BSN Entered By: Shawn Stall on 03/04/2023 08:40:58 -------------------------------------------------------------------------------- Problem List Details Patient Name: Date of Service: [name], [name]. 03/04/2023 10:30 Billy M Medical Record Number: [id number] Patient Account Number: [id number] Date of Birth/Sex: Treating RN: 1945-10-13 (77 y.o. 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 03/04/2023 10:30 Billy M Medical Record Number: [id number] Patient Account Number: [id number] Date of Birth/Sex: Treating RN: 1945-12-24 (76 y.o. M) Primary Care Provider: [name] Other Clinician: Referring  Provider: Treating Provider/Extender: Milas Hock, [place] Cerise in Treatment: [address] NILS, THOR ([id number]) [id number] Page 6 of 8 Subjective History of Present Illness (HPI) ADMISSION 02/26/2023 Patient is Billy 77 year old man with type 2 diabetes. He has had Billy 54-month history of wounds on his bilateral heels just above the heel tips. They have been using Iodoflex/Iodosorb ointment. The patient has Billy history of PAD and has Billy SFA stent on the right. He also also status post left iliac stent. Furthermore he has Billy history of carotid artery stenosis status post right carotid endarterectomy and Billy left subclavian artery stenosis. His wound started on his blisters on the heel that of open to wounds with minimal but some depth right greater than left. He also has advanced diabetic neuropathy. The patient is cared for through the [place] and has been referred here for hyperbaric oxygen treatment through his  [name], [name] ([id number]) [id number] Page 1 of 8 Visit Report for 03/04/2023 Debridement Details Patient Name: Date of Service: [name], [name]. 03/04/2023 10:30 Billy M Medical Record Number: [id number] Patient Account Number: [id number] Date of Birth/Sex: Treating RN: [birth year] (77 y.o. M) Primary Care Provider: [name] Other Clinician: Referring Provider: Treating Provider/Extender: [name] [facility], [place] Cerise in Treatment: 0 Debridement Performed for Assessment: Wound #1 Left,Posterior Calcaneus Performed By: Physician Maxwell Caul., MD The following information was scribed by: Shawn Stall The information was scribed for: [name] Debridement Type: Debridement Severity of Tissue Pre Debridement: Fat layer exposed Level of Consciousness (Pre-procedure): Awake and Alert Pre-procedure Verification/Time Out Yes - 11:15 Taken: Start Time: 11:16 Pain Control: Lidocaine 4% T opical Solution Percent of Wound Bed Debrided: 100% T Area Debrided (cm): otal 0.2 Tissue and other material debrided: Viable, Non-Viable, Slough, Subcutaneous, Skin: Dermis , Skin: Epidermis, Slough Level: Skin/Subcutaneous Tissue Debridement Description: Excisional Instrument: Curette Bleeding: Minimum Hemostasis Achieved: Pressure End Time: 11:21 Procedural Pain: 0 Post Procedural Pain: 0 Response to Treatment: Procedure was tolerated well Level of Consciousness (Post- Awake and Alert procedure): Post Debridement Measurements of Total Wound Length: (cm) 0.5 Width: (cm) 0.5 Depth: (cm) 0.2 Volume: (cm) 0.039 Character of Wound/Ulcer Post Debridement: Improved Severity of Tissue Post Debridement: Fat layer exposed Post Procedure Diagnosis Same as Pre-procedure Electronic Signature([name] Signed: 03/04/2023 4:41:28 PM By: [name] MD Entered By: [name] on 03/04/2023  08:53:25 -------------------------------------------------------------------------------- HPI Details Patient Name: Date of Service: [name], [name]. 03/04/2023 10:30 Billy M Medical Record Number: [id number] Patient Account Number: [id number] Date of Birth/Sex: Treating RN: Feb 18, 1946 (77 y.o. M) Primary Care Provider: [name] Other Clinician: Referring Provider: Treating Provider/Extender: Milas Hock, [place] Cerise in Treatment: [address] [name], [name] ([id number]) [id number] Page 2 of 8 History of Present Illness HPI Description: ADMISSION 02/26/2023 Patient is Billy 77 year old man with type 2 diabetes. He has had Billy 18-month history of wounds on his bilateral heels just above the heel tips. They have been using Iodoflex/Iodosorb ointment. The patient has Billy history of PAD and has Billy SFA stent on the right. He also also status post left iliac stent. Furthermore he has Billy history of carotid artery stenosis status post right carotid endarterectomy and Billy left subclavian artery stenosis. His wound started on his blisters on the heel that of open to wounds with minimal but some depth right greater than left. He also has advanced diabetic neuropathy. The patient is cared for through the [place] and has been referred here for hyperbaric oxygen treatment through his vascular surgeon. Past medical history includes hypertension, hyperlipidemia, type 2 diabetes chronic kidney disease stage IIIb, carotid artery stenosis status post right carotid endarterectomy, left subclavian artery stenosis ABIs done in our clinic were 1.11 on the right and 0.75 on the left these were remarkably similar to what was quoted during the vascular surgery appointment from 02/05/2023. 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 He was referred for hyperbaric oxygen by the [place]. His chest x-ray I think shows vascular crowding but no  pneumothorax. He has been using Iodoflex on the wound with reasonably good improvement this week. He sees his ENT in 2 days and then they will make Billy decision about when they want to start HBO Electronic Signature([name] Signed: 03/04/2023 4:41:28 PM By: [name] MD Entered By: [name] on 03/04/2023 08:54:52 -------------------------------------------------------------------------------- Physical Exam Details  Patient Name: Date of Service: [name], [name]. 03/04/2023 10:30 Billy M Medical Record Number: [id number] Patient Account Number: [id number] Date of Birth/Sex: Treating RN: 01-30-1946 (77 y.o. M) Primary Care Provider: [name] Other Clinician: Referring Provider: Treating Provider/Extender: Kathaleen Grinder in Treatment: 0 Constitutional Patient is hypertensive.. Pulse regular and within target range for patient.Marland Kitchen Respirations regular, non-labored and within target range.. Temperature is normal and within the target range for the patient.Marland Kitchen Appears in no distress. Notes Wound exam; the area on the left is small and superficial surface looks quite good on the left I remove nonviable subcutaneous tissue and eschar hemostasis with direct pressure. Neither 1 of these areas has surrounding soft tissue or skin infection. They do they do not probe to bone in either area Electronic Signature([name] Signed: 03/04/2023 4:41:28 PM By: [name] MD Entered By: [name] on 03/04/2023 08:56:09 -------------------------------------------------------------------------------- Physician Orders Details Patient Name: Date of Service: [name], [name]. 03/04/2023 10:30 Billy M Medical Record Number: [id number] Patient Account Number: [id number] Date of Birth/Sex: Treating RN: [birth year] (76 y.o. 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 - Dr. Leanord Hawking Thursday 03/14/2023 0815 (before hyperbarics) Return Billy ****Once ENT approves Dr. Christella Hartigan to start Hyperbarics can start Thursday or Monday 1000 slot be here at 0930***** ppointment in 2 weeks. - Dr. Leanord Hawking (front office to schedule appts) before or after Hyperbarics. Return Billy Return appointment in 3 weeks. - Dr. Leanord Hawking (front office to schedule appts) before or after Hyperbarics. Return appointment in 1 month. - Dr. Leanord Hawking (front office to schedule appts) before or after Hyperbarics. Other: - Patient to call wound center when ready to start hyperbarics. Anesthetic (In clinic) Topical Lidocaine 4% applied to wound bed - In clinic Bathing/ Shower/ Hygiene May shower with protection but do not get wound dressing([name] wet. Protect dressing([name] with water repellant cover (for example, large plastic bag) or Billy cast cover and may then take shower. Off-Loading Wound #1 Left,Posterior Calcaneus Wedge shoe to: - Off loading shoes bilaterally to alleviate pressure from heels. Turn and reposition every 2 hours Prevalon Boot - bilaterally at bedtime Wound #2 Right,Posterior Calcaneus Wedge shoe to: - Off loading shoes bilaterally to alleviate pressure from heels. Turn and reposition  every 2 hours Prevalon Boot - bilaterally at bedtime Hyperbaric Oxygen Therapy Wound #1 Left,Posterior Calcaneus Evaluate for HBO Therapy - Per patient, he has claustrophobia. Evaluate for HBO Therapy - Per patient, he has claustrophobia. If appropriate for treatment, begin HBOT per protocol: 2.0 ATA for 90 Minutes without Billy Breaks ir 2.0 ATA for 90 Minutes without Billy Breaks ir Total Number of Treatments: - 40 One treatments per day (delivered Monday through Friday unless otherwise specified in Special Instructions below): One treatments per day (delivered Monday through Friday unless otherwise specified in Special Instructions below): Finger stick Blood Glucose  Pad 4x6 cm (Generic) 2 x Per Week/30 Days ary Discharge Instructions: ****May use iodosorb****Apply to wound bed as instructed Secondary Dressing: ALLEVYN Heel 4 1/2in x 5 1/2in / 10.5cm x 13.5cm (Generic) 2 x Per Week/30 Days Discharge Instructions: Apply over primary dressing as directed. Secured With: [employer], 4.5x3.1 (in/yd) (Generic) 2 x Per Week/30 Days Discharge Instructions: Secure with Kerlix as directed. Secured With: 67M Medipore H Soft Cloth Surgical T ape, 4 x 10 (in/yd) (Generic) 2 x Per Week/30 Days Discharge Instructions: Secure with tape as directed. Secured With: fish net 5in 2 x Per Week/30 Days Discharge Instructions: secure kerlix in place. WOUND #2: - Calcaneus Wound Laterality: Right, Posterior Cleanser: Wound Cleanser 2 x Per Week/30 Days Discharge Instructions: Cleanse the wound with wound cleanser prior to applying Billy clean dressing using gauze sponges,  not tissue or cotton balls. Prim Dressing: IODOFLEX 0.9% Cadexomer Iodine Pad 4x6 cm (Generic) 2 x Per Week/30 Days ary Discharge Instructions: ****May use iodosorb****Apply to wound bed as instructed Secondary Dressing: ALLEVYN Heel 4 1/2in x 5 1/2in / 10.5cm x 13.5cm (Generic) 2 x Per Week/30 Days Discharge Instructions: Apply over primary dressing as directed. Secured With: [employer], 4.5x3.1 (in/yd) (Generic) 2 x Per Week/30 Days Discharge Instructions: Secure with Kerlix as directed. Secured With: 67M Medipore H Soft Cloth Surgical T ape, 4 x 10 (in/yd) (Generic) 2 x Per Week/30 Days Discharge Instructions: Secure with tape as directed. Secured With: fish net 5in 2 x Per Week/30 Days Discharge Instructions: secure kerlix in place. 1. I am continuing the Iodoflex. Although there was an intolerance listed and apparently overlooked to IV contrast dye he has tolerated this well and the wounds look really quite good for only 1 week of treatment 2. He is ready to start HBO. He sees his ENT in 2 days time. Electronic Signature([name] Signed: 03/04/2023 4:41:28 PM By: [name] MD [name], Signed: 03/04/2023 4:41:28 PM By: [name] MD [name] ([id number]) [id number] Page 8 of 8 Entered By: [name] on 03/04/2023 08:56:58 -------------------------------------------------------------------------------- SuperBill Details Patient Name: Date of Service: [name], [name]. 03/04/2023 Medical Record Number: [id number] Patient Account Number: [id number] Date of Birth/Sex: Treating RN: 1945-06-02 (77 y.o. 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 E11.42 Type 2 diabetes mellitus with diabetic polyneuropathy E11.51 Type 2 diabetes mellitus with diabetic peripheral angiopathy without gangrene Facility Procedures : CPT4 Code: [id number] Description: 11042 - DEB SUBQ TISSUE 20 SQ CM/< ICD-10 Diagnosis Description 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 Modifier: rity Quantity: 1 Physician Procedures : CPT4 Code Description Modifier [id number] 11042 - WC PHYS SUBQ TISS 20 SQ CM ICD-10 Diagnosis Description 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 03/04/2023 10:30 Billy M Medical Record Number: [id number] Patient Account Number: [id number] Date of Birth/Sex: Treating RN: [birth year] (77 y.o. M) Primary Care Provider: [name] Other Clinician: Referring Provider: Treating Provider/Extender: [name] [facility], [place] in Treatment: 0 Debridement Performed for Assessment: Wound #1 Left,Posterior Calcaneus Performed By: Physician Maxwell Caul., MD The following information was scribed by: Shawn Stall The information was scribed for: [name] Debridement Type: Debridement Severity of Tissue Pre Debridement: Fat layer exposed Level of Consciousness (Pre-procedure): Awake and Alert Pre-procedure Verification/Time Out Yes - 11:15 Taken: Start Time: 11:16 Pain Control: Lidocaine 4% T opical Solution Percent of Wound Bed Debrided: 100% T Area Debrided (cm): otal 0.2 Tissue and other material debrided: Viable, Non-Viable, Slough, Subcutaneous, Skin: Dermis , Skin: Epidermis, Slough Level: Skin/Subcutaneous Tissue Debridement Description: Excisional Instrument: Curette Bleeding: Minimum Hemostasis Achieved: Pressure End Time: 11:21 Procedural Pain: 0 Post Procedural Pain: 0 Response to Treatment: Procedure was tolerated well Level of Consciousness (Post- Awake and Alert procedure): Post Debridement Measurements of Total Wound Length: (cm) 0.5 Width: (cm) 0.5 Depth: (cm) 0.2 Volume: (cm) 0.039 Character of Wound/Ulcer Post Debridement: Improved Severity of Tissue Post Debridement: Fat layer exposed Post Procedure Diagnosis Same as Pre-procedure Electronic Signature([name] Signed: 03/04/2023 4:41:28 PM By: [name] MD Entered By: [name] on 03/04/2023  08:53:25 -------------------------------------------------------------------------------- HPI Details Patient Name: Date of Service: [name], [name]. 03/04/2023 10:30 Billy M Medical Record Number: [id number] Patient Account Number: [id number] Date of Birth/Sex: Treating RN: Feb 18, 1946 (77 y.o. M) Primary Care Provider: [name] Other Clinician: Referring Provider: Treating Provider/Extender: Milas Hock, [place] Cerise in Treatment: [address] [name], [name] ([id number]) [id number] Page 2 of 8 History of Present Illness HPI Description: ADMISSION 02/26/2023 Patient is Billy 77 year old man with type 2 diabetes. He has had Billy 18-month history of wounds on his bilateral heels just above the heel tips. They have been using Iodoflex/Iodosorb ointment. The patient has Billy history of PAD and has Billy SFA stent on the right. He also also status post left iliac stent. Furthermore he has Billy history of carotid artery stenosis status post right carotid endarterectomy and Billy left subclavian artery stenosis. His wound started on his blisters on the heel that of open to wounds with minimal but some depth right greater than left. He also has advanced diabetic neuropathy. The patient is cared for through the [place] and has been referred here for hyperbaric oxygen treatment through his vascular surgeon. Past medical history includes hypertension, hyperlipidemia, type 2 diabetes chronic kidney disease stage IIIb, carotid artery stenosis status post right carotid endarterectomy, left subclavian artery stenosis ABIs done in our clinic were 1.11 on the right and 0.75 on the left these were remarkably similar to what was quoted during the vascular surgery appointment from 02/05/2023. 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 He was referred for hyperbaric oxygen by the [place]. His chest x-ray I think shows vascular crowding but no  pneumothorax. He has been using Iodoflex on the wound with reasonably good improvement this week. He sees his ENT in 2 days and then they will make Billy decision about when they want to start HBO Electronic Signature([name] Signed: 03/04/2023 4:41:28 PM By: [name] MD Entered By: [name] on 03/04/2023 08:54:52 -------------------------------------------------------------------------------- Physical Exam Details  Patient Name: Date of Service: [name], [name]. 03/04/2023 10:30 Billy M Medical Record Number: [id number] Patient Account Number: [id number] Date of Birth/Sex: Treating RN: 01-30-1946 (77 y.o. M) Primary Care Provider: [name] Other Clinician: Referring Provider: Treating Provider/Extender: Kathaleen Grinder in Treatment: 0 Constitutional Patient is hypertensive.. Pulse regular and within target range for patient.Marland Kitchen Respirations regular, non-labored and within target range.. Temperature is normal and within the target range for the patient.Marland Kitchen Appears in no distress. Notes Wound exam; the area on the left is small and superficial surface looks quite good on the left I remove nonviable subcutaneous tissue and eschar hemostasis with direct pressure. Neither 1 of these areas has surrounding soft tissue or skin infection. They do they do not probe to bone in either area Electronic Signature([name] Signed: 03/04/2023 4:41:28 PM By: [name] MD Entered By: [name] on 03/04/2023 08:56:09 -------------------------------------------------------------------------------- Physician Orders Details Patient Name: Date of Service: [name], [name]. 03/04/2023 10:30 Billy M Medical Record Number: [id number] Patient Account Number: [id number] Date of Birth/Sex: Treating RN: [birth year] (76 y.o. 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 - Dr. Leanord Hawking Thursday 03/14/2023 0815 (before hyperbarics) Return Billy ****Once ENT approves Dr. Christella Hartigan to start Hyperbarics can start Thursday or Monday 1000 slot be here at 0930***** ppointment in 2 weeks. - Dr. Leanord Hawking (front office to schedule appts) before or after Hyperbarics. Return Billy Return appointment in 3 weeks. - Dr. Leanord Hawking (front office to schedule appts) before or after Hyperbarics. Return appointment in 1 month. - Dr. Leanord Hawking (front office to schedule appts) before or after Hyperbarics. Other: - Patient to call wound center when ready to start hyperbarics. Anesthetic (In clinic) Topical Lidocaine 4% applied to wound bed - In clinic Bathing/ Shower/ Hygiene May shower with protection but do not get wound dressing([name] wet. Protect dressing([name] with water repellant cover (for example, large plastic bag) or Billy cast cover and may then take shower. Off-Loading Wound #1 Left,Posterior Calcaneus Wedge shoe to: - Off loading shoes bilaterally to alleviate pressure from heels. Turn and reposition every 2 hours Prevalon Boot - bilaterally at bedtime Wound #2 Right,Posterior Calcaneus Wedge shoe to: - Off loading shoes bilaterally to alleviate pressure from heels. Turn and reposition  every 2 hours Prevalon Boot - bilaterally at bedtime Hyperbaric Oxygen Therapy Wound #1 Left,Posterior Calcaneus Evaluate for HBO Therapy - Per patient, he has claustrophobia. Evaluate for HBO Therapy - Per patient, he has claustrophobia. If appropriate for treatment, begin HBOT per protocol: 2.0 ATA for 90 Minutes without Billy Breaks ir 2.0 ATA for 90 Minutes without Billy Breaks ir Total Number of Treatments: - 40 One treatments per day (delivered Monday through Friday unless otherwise specified in Special Instructions below): One treatments per day (delivered Monday through Friday unless otherwise specified in Special Instructions below): Finger stick Blood Glucose

## 2023-03-06 NOTE — Progress Notes (Signed)
IODOFLEX 0.9% Cadexomer Iodine Pad 4x6 cm Discharge Instruction: ****May use iodosorb****Apply to wound bed as instructed Secondary Dressing ALLEVYN Heel 4 1/2in x 5 1/2in / 10.5cm x 13.5cm Discharge Instruction: Apply over primary dressing as directed. Secured With [employer], 4.5x3.1 (in/yd) Discharge Instruction: Secure with Kerlix as directed. 52M Medipore H Soft Cloth Surgical T ape, 4 x 10 (in/yd) Discharge Instruction: Secure with tape as directed. Compression Wrap Compression Stockings Add-Ons Electronic Signature(s) Signed: 03/04/2023 5:13:31 PM By: Shawn Stall RN, BSN Signed: 03/06/2023 4:39:33 PM By: Thayer Dallas Entered By: Thayer Dallas on 03/04/2023 08:06:53 -------------------------------------------------------------------------------- Wound Assessment Details Patient Name: Date of Service: [name], [name] 03/04/2023 10:30 A M Medical Record Number: [id number] Patient Account Number: [id number] RAJI, GLINSKI ([id number]) [id number] Page 8 of 9 Date of Birth/Sex: Treating RN: 1946-01-15 (77 y.o. [name] Primary Care Asaiah Hunnicutt: Other Clinician: [name] Referring Baker Kogler: Treating Arael Piccione/Extender: Kathaleen Grinder in Treatment: 0 Wound Status Wound Number: 2 Primary Diabetic Wound/Ulcer of the Lower Extremity Etiology: Wound Location: Right, Posterior Calcaneus Wound Open Wounding Event: Blister Status: Date Acquired: 01/20/2023 Comorbid Arrhythmia, Coronary Artery Disease, Hypertension, Peripheral Weeks Of Treatment: 0 History: Venous Disease, Type II Diabetes,  Osteoarthritis, Neuropathy Clustered Wound: No Photos Wound Measurements Length: (cm) 1.5 Width: (cm) 0.9 Depth: (cm) 0.2 Area: (cm) 1.06 Volume: (cm) 0.212 % Reduction in Area: 6.3% % Reduction in Volume: 37.5% Epithelialization: Small (1-33%) Tunneling: No Undermining: No Wound Description Classification: Grade 2 Wound Margin: Distinct, outline attached Exudate Amount: Medium Exudate Type: Serosanguineous Exudate Color: red, brown Foul Odor After Cleansing: No Slough/Fibrino Yes Wound Bed Granulation Amount: Medium (34-66%) Exposed Structure Granulation Quality: Red, Pink Fascia Exposed: No Necrotic Amount: Medium (34-66%) Fat Layer (Subcutaneous Tissue) Exposed: Yes Necrotic Quality: Adherent Slough Tendon Exposed: No Muscle Exposed: No Joint Exposed: No Bone Exposed: No Periwound Skin Texture Texture Color No Abnormalities Noted: Yes No Abnormalities Noted: Yes Moisture No Abnormalities Noted: No Dry / Scaly: Yes Treatment Notes Wound #2 (Calcaneus) Wound Laterality: Right, Posterior Cleanser Peri-Wound Care Topical Primary Dressing IODOFLEX 0.9% Cadexomer Iodine Pad 4x6 cm Discharge Instruction: ****May use iodosorb****Apply to wound bed as instructed Secondary Dressing ALLEVYN Heel 4 1/2in x 5 1/2in / 10.5cm x 13.5cm Discharge Instruction: Apply over primary dressing as directed. DAMANI, KELEMEN ([id number]) [id number] Page 9 of 9 Secured With [employer], 4.5x3.1 (in/yd) Discharge Instruction: Secure with Kerlix as directed. 52M Medipore H Soft Cloth Surgical T ape, 4 x 10 (in/yd) Discharge Instruction: Secure with tape as directed. Compression Wrap Compression Stockings Add-Ons Electronic Signature(s) Signed: 03/04/2023 5:13:31 PM By: Shawn Stall RN, BSN Signed: 03/06/2023 4:39:33 PM By: Thayer Dallas Entered By: Thayer Dallas on 03/04/2023  08:07:11 -------------------------------------------------------------------------------- Vitals Details Patient Name: Date of Service: [name], [name] 03/04/2023 10:30 A M Medical Record Number: [id number] Patient Account Number: [id number] Date of Birth/Sex: Treating RN: 1946/02/27 (77 y.o. [name] Primary Care Pearson Picou: [name] Other Clinician: Referring Ilean Spradlin: Treating Payden Docter/Extender: [name] [facility], [place] Cerise in Treatment: 0 Vital Signs Time Taken: 10:58 Temperature (F): 98 Height (in): 70 Pulse (bpm): 66 Weight (lbs): 196 Respiratory Rate (breaths/min): 20 Body Mass Index (BMI): 28.1 Blood Pressure (mmHg): 161/71 Capillary Blood Glucose (mg/dl): 952 Reference Range: 80 - 120 mg / dl Electronic Signature(s) Signed: 03/04/2023 5:13:31 PM By: Shawn Stall RN, BSN Entered By: Shawn Stall on 03/04/2023 07:59:05  Treating RN: Oct 14, 1945 (77 y.o. [name] Primary Care Derhonda Eastlick: [name] Other Clinician: Referring Phylliss Strege: Treating Micah Galeno/Extender: Milas Hock, [place] Cerise in Treatment: 0 Active Inactive Wound/Skin Impairment Nursing Diagnoses: MICHAEL, WALRATH ([id number]) [id number] Page 5 of 9 Knowledge deficit related to smoking impact on wound healing Goals: Patient/caregiver will verbalize understanding of skin care regimen Date Initiated: 02/26/2023 Target Resolution Date: 04/20/2023 Goal Status: Active Interventions: Assess patient/caregiver ability to obtain necessary  supplies Assess patient/caregiver ability to perform ulcer/skin care regimen upon admission and as needed Assess ulceration(s) every visit Provide education on ulcer and skin care Screen for HBO Treatment Activities: Consult for HBO : 02/26/2023 Skin care regimen initiated : 02/26/2023 Topical wound management initiated : 02/26/2023 Notes: Electronic Signature(s) Signed: 03/04/2023 5:13:31 PM By: Shawn Stall RN, BSN Entered By: Shawn Stall on 03/04/2023 08:07:39 -------------------------------------------------------------------------------- Pain Assessment Details Patient Name: Date of Service: [name] LD, [name] 03/04/2023 10:30 A M Medical Record Number: [id number] Patient Account Number: [id number] Date of Birth/Sex: Treating RN: Aug 20, 1945 (77 y.o. [name] Primary Care Wahneta Derocher: [name] Other Clinician: Referring Emilliano Dilworth: Treating Savina Olshefski/Extender: [name] [facility], [place] Cer[place] in Treatment: 0 Active Problems Location of Pain Severity and Description of Pain Patient Has Paino No Site Locations Pain Management and Medication Current Pain Management: Electronic Signature(s) Signed: 03/04/2023 5:13:31 PM By: Shawn Stall RN, BSN Entered By: Shawn Stall on 03/04/2023 07:59:08 [name] ([id number]) [id number] Page 6 of 9 -------------------------------------------------------------------------------- Patient/Caregiver Education Details Patient Name: Date of Service: [name], [name] 10/14/2024andnbsp10:30 A M Medical Record Number: [id number] Patient Account Number: [id number] Date of Birth/Gender: Treating RN: 10-01-1945 (77 y.o. [name] Primary Care Physician: [name] Other Clinician: Referring Physician: Treating Physician/Extender: [name] [facility], [place] Cerise in Treatment: 0 Education Assessment Education Provided To: Patient Education Topics  Provided Wound/Skin Impairment: Handouts: Caring for Your Ulcer Methods: Explain/Verbal Responses: Reinforcements needed Electronic Signature(s) Signed: 03/04/2023 5:13:31 PM By: Shawn Stall RN, BSN Entered By: Shawn Stall on 03/04/2023 08:07:52 -------------------------------------------------------------------------------- Wound Assessment Details Patient Name: Date of Service: [name], [name] 03/04/2023 10:30 A M Medical Record Number: [id number] Patient Account Number: [id number] Date of Birth/Sex: Treating RN: 04/06/1946 (77 y.o. [name] Primary Care Theo Reither: [name] Other Clinician: Referring Brandun Pinn: Treating Una Yeomans/Extender: [name], [place] Cerise in Treatment: 0 Wound Status Wound Number: 1 Primary Diabetic Wound/Ulcer of the Lower Extremity Etiology: Wound Location: Left, Posterior Calcaneus Wound Open Wounding Event: Blister Status: Date Acquired: 01/20/2023 Comorbid Arrhythmia, Coronary Artery Disease, Hypertension, Peripheral Weeks Of Treatment: 0 History: Venous Disease, Type II Diabetes, Osteoarthritis, Neuropathy Clustered Wound: No Photos Wound Measurements Length: (cm) 0.5 DAWAUN, BRANCATO S ([id number]) Width: (cm) 0.5 Depth: (cm) 0.2 Area: (cm) 0.196 Volume: (cm) 0.039 % Reduction in Area: 88.4% [id number] Page 7 of 9 % Reduction in Volume: 88.5% Epithelialization: Medium (34-66%) Tunneling: No Undermining: No Wound Description Classification: Grade 3 Wound Margin: Distinct, outline attached Exudate Amount: Medium Exudate Type: Serosanguineous Exudate Color: red, brown Foul Odor After Cleansing: No Slough/Fibrino Yes Wound Bed Granulation Amount: Large (67-100%) Exposed Structure Granulation Quality: Red, Pink Fascia Exposed: No Necrotic Amount: None Present (0%) Fat Layer (Subcutaneous Tissue) Exposed: Yes Tendon Exposed: No Muscle Exposed: No Joint Exposed:  No Bone Exposed: No Periwound Skin Texture Texture Color No Abnormalities Noted: Yes No Abnormalities Noted: Yes Moisture No Abnormalities Noted: No Dry / Scaly: Yes Treatment Notes Wound #1 (Calcaneus) Wound Laterality: Left, Posterior Cleanser Peri-Wound Care Topical Primary Dressing  Treating RN: Oct 14, 1945 (77 y.o. [name] Primary Care Derhonda Eastlick: [name] Other Clinician: Referring Phylliss Strege: Treating Micah Galeno/Extender: Milas Hock, [place] Cerise in Treatment: 0 Active Inactive Wound/Skin Impairment Nursing Diagnoses: MICHAEL, WALRATH ([id number]) [id number] Page 5 of 9 Knowledge deficit related to smoking impact on wound healing Goals: Patient/caregiver will verbalize understanding of skin care regimen Date Initiated: 02/26/2023 Target Resolution Date: 04/20/2023 Goal Status: Active Interventions: Assess patient/caregiver ability to obtain necessary  supplies Assess patient/caregiver ability to perform ulcer/skin care regimen upon admission and as needed Assess ulceration(s) every visit Provide education on ulcer and skin care Screen for HBO Treatment Activities: Consult for HBO : 02/26/2023 Skin care regimen initiated : 02/26/2023 Topical wound management initiated : 02/26/2023 Notes: Electronic Signature(s) Signed: 03/04/2023 5:13:31 PM By: Shawn Stall RN, BSN Entered By: Shawn Stall on 03/04/2023 08:07:39 -------------------------------------------------------------------------------- Pain Assessment Details Patient Name: Date of Service: [name] LD, [name] 03/04/2023 10:30 A M Medical Record Number: [id number] Patient Account Number: [id number] Date of Birth/Sex: Treating RN: Aug 20, 1945 (77 y.o. [name] Primary Care Wahneta Derocher: [name] Other Clinician: Referring Emilliano Dilworth: Treating Savina Olshefski/Extender: [name], [place] Cerise in Treatment: 0 Active Problems Location of Pain Severity and Description of Pain Patient Has Paino No Site Locations Pain Management and Medication Current Pain Management: Electronic Signature(s) Signed: 03/04/2023 5:13:31 PM By: Shawn Stall RN, BSN Entered By: Shawn Stall on 03/04/2023 07:59:08 [name] ([id number]) [id number] Page 6 of 9 -------------------------------------------------------------------------------- Patient/Caregiver Education Details Patient Name: Date of Service: [name], [name] 10/14/2024andnbsp10:30 A M Medical Record Number: [id number] Patient Account Number: [id number] Date of Birth/Gender: Treating RN: 10-01-1945 (77 y.o. [name] Primary Care Physician: [name] Other Clinician: Referring Physician: Treating Physician/Extender: [name], [place] Cerise in Treatment: 0 Education Assessment Education Provided To: Patient Education Topics  Provided Wound/Skin Impairment: Handouts: Caring for Your Ulcer Methods: Explain/Verbal Responses: Reinforcements needed Electronic Signature(s) Signed: 03/04/2023 5:13:31 PM By: Shawn Stall RN, BSN Entered By: Shawn Stall on 03/04/2023 08:07:52 -------------------------------------------------------------------------------- Wound Assessment Details Patient Name: Date of Service: [name], [name] 03/04/2023 10:30 A M Medical Record Number: [id number] Patient Account Number: [id number] Date of Birth/Sex: Treating RN: 04/06/1946 (77 y.o. [name] Primary Care Theo Reither: [name] Other Clinician: Referring Brandun Pinn: Treating Una Yeomans/Extender: [name], [place] Cerise in Treatment: 0 Wound Status Wound Number: 1 Primary Diabetic Wound/Ulcer of the Lower Extremity Etiology: Wound Location: Left, Posterior Calcaneus Wound Open Wounding Event: Blister Status: Date Acquired: 01/20/2023 Comorbid Arrhythmia, Coronary Artery Disease, Hypertension, Peripheral Weeks Of Treatment: 0 History: Venous Disease, Type II Diabetes, Osteoarthritis, Neuropathy Clustered Wound: No Photos Wound Measurements Length: (cm) 0.5 DAWAUN, BRANCATO S ([id number]) Width: (cm) 0.5 Depth: (cm) 0.2 Area: (cm) 0.196 Volume: (cm) 0.039 % Reduction in Area: 88.4% [id number] Page 7 of 9 % Reduction in Volume: 88.5% Epithelialization: Medium (34-66%) Tunneling: No Undermining: No Wound Description Classification: Grade 3 Wound Margin: Distinct, outline attached Exudate Amount: Medium Exudate Type: Serosanguineous Exudate Color: red, brown Foul Odor After Cleansing: No Slough/Fibrino Yes Wound Bed Granulation Amount: Large (67-100%) Exposed Structure Granulation Quality: Red, Pink Fascia Exposed: No Necrotic Amount: None Present (0%) Fat Layer (Subcutaneous Tissue) Exposed: Yes Tendon Exposed: No Muscle Exposed: No Joint Exposed:  No Bone Exposed: No Periwound Skin Texture Texture Color No Abnormalities Noted: Yes No Abnormalities Noted: Yes Moisture No Abnormalities Noted: No Dry / Scaly: Yes Treatment Notes Wound #1 (Calcaneus) Wound Laterality: Left, Posterior Cleanser Peri-Wound Care Topical Primary Dressing  JOURNEE, KOHEN ([id number]) [id number] Page 1 of 9 Visit Report for 03/04/2023 Arrival Information Details Patient Name: Date of Service: [name], [name] 03/04/2023 10:30 A M Medical Record Number: [id number] Patient Account Number: [id number] Date of Birth/Sex: Treating RN: Nov 18, 1945 (77 y.o. [name] Primary Care Versie Fleener: [name] Other Clinician: Referring Kamaria Lucia: Treating Mario Coronado/Extender: [name], [place] Cer[place] in Treatment: 0 Visit Information History Since Last Visit Added or deleted any medications: No Patient Arrived: [name] Any new allergies or adverse reactions: No Arrival Time: 10:50 Had a fall or experienced change in No Accompanied By: wife activities of daily living that may affect Transfer Assistance: None risk of falls: Patient Identification Verified: Yes Signs or symptoms of abuse/neglect since last visito No Secondary Verification Process Completed: Yes Hospitalized since last visit: No Patient Requires Transmission-Based Precautions: No Implantable device outside of the clinic excluding No Patient Has Alerts: No cellular tissue based products placed in the center since last visit: Has Dressing in Place as Prescribed: Yes Has Footwear/Offloading in Place as Prescribed: Yes Left: Wedge Shoe Right: Wedge Shoe Pain Present Now: No Notes Gagandeep Kossman made aware of iodine allergy. Electronic Signature(s) Signed: 03/04/2023 5:13:31 PM By: Shawn Stall RN, BSN Entered By: Shawn Stall on 03/04/2023 11:41:31 -------------------------------------------------------------------------------- Encounter Discharge Information Details Patient Name: Date of Service: [name], [name] 03/04/2023 10:30 A M Medical Record Number: [id number] Patient Account Number: [id number] Date of Birth/Sex: Treating RN: [birth year] (77 y.o. [name] Primary Care Burlene Montecalvo: [name] Other  Clinician: Referring Bridney Guadarrama: Treating Elliott Quade/Extender: Kathaleen Grinder in Treatment: 0 Encounter Discharge Information Items Post Procedure Vitals Discharge Condition: Stable Temperature (F): 98 Ambulatory Status: Walker Pulse (bpm): 66 Discharge Destination: Home Respiratory Rate (breaths/min): 20 Transportation: Private Auto Blood Pressure (mmHg): 161/71 Accompanied By: wife Schedule Follow-up Appointment: Yes Clinical Summary of Care: Electronic Signature(s) Signed: 03/04/2023 5:13:31 PM By: Shawn Stall RN, BSN Entered By: Shawn Stall on 03/04/2023 08:29:33 [name] ([id number]) [id number] Page 2 of 9 -------------------------------------------------------------------------------- Lower Extremity Assessment Details Patient Name: Date of Service: [name], [name] 03/04/2023 10:30 A M Medical Record Number: [id number] Patient Account Number: [id number] Date of Birth/Sex: Treating RN: Jul 09, 1945 (77 y.o. [name] Primary Care Torrance Frech: [name] Other Clinician: Referring Lorenna Lurry: Treating Allysia Ingles/Extender: [name], [place] in Treatment: 0 Edema Assessment Assessed: [Left: Yes] [Right: Yes] Edema: [Left: No] [Right: No] Calf Left: Right: Point of Measurement: From Medial Instep 34 cm 35 cm Ankle Left: Right: Point of Measurement: From Medial Instep 19 cm 21 cm Vascular Assessment Pulses: Dorsalis Pedis Palpable: [Left:Yes] [Right:Yes] Extremity colors, hair growth, and conditions: Extremity Color: [Left:Normal] [Right:Normal] Hair Growth on Extremity: [Left:No] [Right:No] Temperature of Extremity: [Left:Warm] [Right:Warm] Capillary Refill: [Left:< 3 seconds] [Right:< 3 seconds] Dependent Rubor: [Left:No] [Right:No] Blanched when Elevated: [Left:No No] [Right:No No] Toe Nail Assessment Left: Right: Thick: No No Discolored: No No Deformed: No  No Improper Length and Hygiene: No No Electronic Signature(s) Signed: 03/04/2023 5:13:31 PM By: Shawn Stall RN, BSN Entered By: Shawn Stall on 03/04/2023 07:59:52 -------------------------------------------------------------------------------- Multi Wound Chart Details Patient Name: Date of Service: [name], [name] 03/04/2023 10:30 A M Medical Record Number: [id number] Patient Account Number: [id number] Date of Birth/Sex: Treating RN: 15-May-1946 (77 y.o. M) Primary Care Janessa Mickle: [name] Other Clinician: Referring Marjorie Deprey: Treating Tres Grzywacz/Extender: Milas Hock, [place] Cer[place] in Treatment: 0 Vital Signs AYDEN, HARDWICK ([id number]) [id number] Page 3 of 9 Height(in): 70

## 2023-03-11 ENCOUNTER — Encounter (HOSPITAL_BASED_OUTPATIENT_CLINIC_OR_DEPARTMENT_OTHER): Payer: No Typology Code available for payment source | Admitting: Internal Medicine

## 2023-03-11 DIAGNOSIS — E11621 Type 2 diabetes mellitus with foot ulcer: Secondary | ICD-10-CM | POA: Diagnosis not present

## 2023-03-11 LAB — GLUCOSE, CAPILLARY
Glucose-Capillary: 212 mg/dL — ABNORMAL HIGH (ref 70–99)
Glucose-Capillary: 157 mg/dL — ABNORMAL HIGH (ref 70–99)

## 2023-03-11 NOTE — Progress Notes (Signed)
BOONE, CUEBAS ([id number]) [id number] Page 1 of 3 Visit Report for 03/11/2023 HBO Details Patient Name: Date of Service: [name], [name] 03/11/2023 10:00 Billy M Medical Record Number: [id number] Patient Account Number: [id number] Date of Birth/Sex: Treating RN: 1945/12/29 (77 y.o. [name] Primary Care Alishba [place]: [facility] Other Clinician: Karl Bales Referring Saydie Gerdts: Treating Shyhiem Beeney/Extender: [name] [place] in Treatment: 1 HBO Treatment Course Details Treatment Course Number: 1 Ordering Johany Hansman: [name] T Treatments Ordered: otal 40 HBO Treatment Start Date: 03/11/2023 HBO Indication: Diabetic Ulcer(s) of the Lower Extremity Standard/Conservative Wound Care tried and failed greater than or equal to 30 days Wound #1 Left, Posterior Calcaneus , Wound #2 Right, Posterior Calcaneus HBO Treatment Details Treatment Number: 1 Patient Type: Outpatient Chamber Type: Monoplace Chamber Serial #: [id number] Treatment Protocol: 2.0 ATA with 90 minutes oxygen, and no air breaks Treatment Details Compression Rate Down: 1.0 psi / minute De-Compression Rate Up: 1.0 psi / minute Air breaks and breathing Decompress Decompress Compress Tx Pressure Begins Reached periods Begins Ends (leave unused spaces blank) Chamber Pressure (ATA 1 2 ------2 1 ) Clock Time (24 hr) 10:53 11:16 - - - - - - 12:46 13:01 Treatment Length: 128 (minutes) Treatment Segments: 4 Vital Signs Capillary Blood Glucose Reference Range: 80 - 120 mg / dl HBO Diabetic Blood Glucose Intervention Range: <131 mg/dl or >782 mg/dl Time Vitals Blood Respiratory Capillary Blood Glucose Pulse Action Type: Pulse: Temperature: Taken: Pressure: Rate: Glucose (mg/dl): Meter #: Oximetry (%) Taken: Pre 09:57 135/60 62 16 97.9 212 Post 13:09 143/56 62 18 97.5 157 Treatment Response Treatment Toleration: Well Treatment Completion  Status: Treatment Completed without Adverse Event Treatment Notes Today was the patient first treatment. The patient was seen by Dr. Leanord Hawking before starting treatment. No problems noted today. Additional Procedure Documentation Tissue Sevierity: Fat layer exposed Sheilla Maris Notes No concerns with treatment given. Patient was seen for first treatment. Respiratory cardiac exam exams normal. Thematic membrane on the right llooks normal. On the left scarring no effusions but no visible landmarks either Physician HBO Attestation: I certify that I supervised this HBO treatment in accordance with Medicare guidelines. Billy trained emergency response team is readily available per Yes hospital policies and procedures. JANSON, SICOTTE ([id number]) [id number] Page 2 of 3 Continue HBOT as ordered. Yes Electronic Signature(s) Signed: 03/11/2023 6:11:24 PM By: [name] MD Previous Signature: 03/11/2023 4:13:07 PM Version By: Karl Bales EMT Entered By: [name] on 03/11/2023 18:07:41 -------------------------------------------------------------------------------- HBO Safety Checklist Details Patient Name: Date of Service: [name], [name] 03/11/2023 10:00 Billy M Medical Record Number: [id number] Patient Account Number: [id number] Date of Birth/Sex: Treating RN: 1945-10-25 (77 y.o. [name] Primary Care Cia Garretson: [name] Other Clinician: Karl Bales Referring Sharniece Gibbon: Treating Erykah Lippert/Extender: [name] [place] in Treatment: 1 HBO Safety Checklist Items Safety Checklist Consent Form Signed Patient voided / foley secured and emptied When did you last eato 0830 Last dose of injectable or oral agent 0800 metforman Ostomy pouch emptied and vented if applicable NA All implantable devices assessed, documented and approved NA Intravenous access site secured and place NA Valuables secured Linens and cotton and  cotton/polyester blend (less than 51% polyester) Personal oil-based products / skin lotions / body lotions removed Wigs or hairpieces removed NA Smoking or tobacco materials removed Books / newspapers / magazines / loose paper removed Cologne, aftershave, perfume and deodorant removed Jewelry removed (may wrap wedding band) Make-up removed NA Hair care products  removed Battery operated devices (external) removed Heating patches and chemical warmers removed Titanium eyewear removed NA Nail polish cured greater than 10 hours NA Casting material cured greater than 10 hours NA Hearing aids removed NA Loose dentures or partials removed removed by patient Prosthetics have been removed NA Patient demonstrates correct use of air break device (if applicable) Patient concerns have been addressed Patient grounding bracelet on and cord attached to chamber Specifics for Inpatients (complete in addition to above) Medication sheet sent with patient NA Intravenous medications needed or due during therapy sent with patient NA Drainage tubes (e.g. nasogastric tube or chest tube secured and vented) NA Endotracheal or Tracheotomy tube secured NA Cuff deflated of air and inflated with saline NA Airway suctioned NA Notes The safety checklist was done before the treatment was started. Electronic Signature(s) Signed: 03/11/2023 3:28:45 PM By: Karl Bales EMT Entered By: Karl Bales on 03/11/2023 15:28:45 [name] ([id number]) [id number] Page 3 of 3

## 2023-03-11 NOTE — Progress Notes (Addendum)
RAMEEK, GRASSER ([id number]) [id number] Page 1 of 2 Visit Report for 03/11/2023 Arrival Information Details Patient Name: Date of Service: [name], [name] 03/11/2023 10:00 Billy M Medical Record Number: [id number] Patient Account Number: [id number] Date of Birth/Sex: Treating RN: 1945-06-21 (77 y.o. [name] Primary Care Osmara Drummonds: [name] Other Clinician: Karl Bales Referring Demtrius Rounds: Treating Elowen Debruyn/Extender: [name] [place] in Treatment: 1 Visit Information History Since Last Visit All ordered tests and consults were completed: Yes Patient Arrived: Wheel Chair Added or deleted any medications: No Arrival Time: 09:28 Any new allergies or adverse reactions: No Accompanied By: Wife Had Billy fall or experienced change in No Transfer Assistance: None activities of daily living that may affect Patient Identification Verified: Yes risk of falls: Secondary Verification Process Completed: Yes Signs or symptoms of abuse/neglect since last visito No Patient Requires Transmission-Based Precautions: No Hospitalized since last visit: No Patient Has Alerts: No Implantable device outside of the clinic excluding No cellular tissue based products placed in the center since last visit: Pain Present Now: No Electronic Signature(s) Signed: 03/11/2023 3:14:51 PM By: Karl Bales EMT Entered By: Karl Bales on 03/11/2023 12:14:51 -------------------------------------------------------------------------------- Encounter Discharge Information Details Patient Name: Date of Service: [name], [name] 03/11/2023 10:00 Billy M Medical Record Number: [id number] Patient Account Number: [id number] Date of Birth/Sex: Treating RN: [birth year] (77 y.o. [name] Primary Care Draiden Mirsky: [name] Other Clinician: Karl Bales Referring Chapin Arduini: Treating Louiza Moor/Extender: [name]  [place] in Treatment: 1 Encounter Discharge Information Items Discharge Condition: Stable Ambulatory Status: Wheelchair Discharge Destination: Home Transportation: Private Auto Accompanied By: None Schedule Follow-up Appointment: Yes Clinical Summary of Care: Electronic Signature(s) Signed: 03/11/2023 4:15:28 PM By: Karl Bales EMT Entered By: Karl Bales on 03/11/2023 13:15:28 [name] ([id number]) [id number] Page 2 of 2 -------------------------------------------------------------------------------- Vitals Details Patient Name: Date of Service: [name], [name] 03/11/2023 10:00 Billy M Medical Record Number: [id number] Patient Account Number: [id number] Date of Birth/Sex: Treating RN: 1946-05-12 (77 y.o. [name] Primary Care Cristen Murcia: [name] Other Clinician: Karl Bales Referring Nechuma Boven: Treating Camreigh Michie/Extender: [name] [place] in Treatment: 1 Vital Signs Time Taken: 09:57 Temperature (F): 97.9 Height (in): 70 Pulse (bpm): 62 Weight (lbs): 196 Respiratory Rate (breaths/min): 16 Body Mass Index (BMI): 28.1 Blood Pressure (mmHg): 135/60 Capillary Blood Glucose (mg/dl): 063 Reference Range: 80 - 120 mg / dl Electronic Signature(s) Signed: 03/11/2023 3:16:12 PM By: Karl Bales EMT Entered By: Karl Bales on 03/11/2023 12:16:12

## 2023-03-12 ENCOUNTER — Encounter (HOSPITAL_BASED_OUTPATIENT_CLINIC_OR_DEPARTMENT_OTHER): Payer: No Typology Code available for payment source | Admitting: Internal Medicine

## 2023-03-12 DIAGNOSIS — E11621 Type 2 diabetes mellitus with foot ulcer: Secondary | ICD-10-CM | POA: Diagnosis not present

## 2023-03-12 LAB — GLUCOSE, CAPILLARY
Glucose-Capillary: 200 mg/dL — ABNORMAL HIGH (ref 70–99)
Glucose-Capillary: 169 mg/dL — ABNORMAL HIGH (ref 70–99)

## 2023-03-12 NOTE — Progress Notes (Addendum)
SALOME, MASSENA ([id number]) [id number] Page 1 of 2 Visit Report for 03/12/2023 HBO Details Patient Name: Date of Service: Billy [name], [name] 03/12/2023 10:00 Billy M Medical Record Number: [id number] Patient Account Number: [id number] Date of Birth/Sex: Treating RN: 1945/11/04 (77 y.o. [name] Primary Care Samanta Gal: [name] Other Clinician: Karl Bales Referring Benaiah Behan: Treating Sahmya Arai/Extender: [name] [place] in Treatment: 2 HBO Treatment Course Details Treatment Course Number: 1 Ordering Tessy Pawelski: [name] T Treatments Ordered: otal 40 HBO Treatment Start Date: 03/11/2023 HBO Indication: Diabetic Ulcer(s) of the Lower Extremity Standard/Conservative Wound Care tried and failed greater than or equal to 30 days Wound #1 Left, Posterior Calcaneus , Wound #2 Right, Posterior Calcaneus HBO Treatment Details Treatment Number: 2 Patient Type: Outpatient Chamber Type: Monoplace Chamber Serial #: [id number] Treatment Protocol: 2.0 ATA with 90 minutes oxygen, and no air breaks Treatment Details Compression Rate Down: 1.0 psi / minute De-Compression Rate Up: 1.0 psi / minute Air breaks and breathing Decompress Decompress Compress Tx Pressure Begins Reached periods Begins Ends (leave unused spaces blank) Chamber Pressure (ATA 1 2 ------2 1 ) Clock Time (24 hr) 10:42 10:57 - - - - - - 12:27 12:41 Treatment Length: 119 (minutes) Treatment Segments: 4 Vital Signs Capillary Blood Glucose Reference Range: 80 - 120 mg / dl HBO Diabetic Blood Glucose Intervention Range: <131 mg/dl or >616 mg/dl Time Vitals Blood Respiratory Capillary Blood Glucose Pulse Action Type: Pulse: Temperature: Taken: Pressure: Rate: Glucose (mg/dl): Meter #: Oximetry (%) Taken: Pre 09:48 136/58 61 18 97.5 200 Post 12:44 144/*58 62 18 97.4 169 Treatment Response Treatment Toleration: Well Treatment Completion  Status: Treatment Completed without Adverse Event Additional Procedure Documentation Tissue Sevierity: Fat layer exposed [name] Notes He was concerned before treatment with the feeling that he may have fluid in his ears. His ears were checked I did not see any evidence of that. He tolerated treatment otherwise without incident Physician HBO Attestation: I certify that I supervised this HBO treatment in accordance with Medicare guidelines. Billy trained emergency response team is readily available per Yes hospital policies and procedures. Continue HBOT as ordered. [address] [name] ([id number]) [id number] Page 2 of 2 Electronic Signature(s) Signed: 03/12/2023 4:45:38 PM By: [name] MD Previous Signature: 03/12/2023 2:22:57 PM Version By: Karl Bales EMT Entered By: [name] on 03/12/2023 16:41:42 -------------------------------------------------------------------------------- HBO Safety Checklist Details Patient Name: Date of Service: [name] LD, [name] 03/12/2023 10:00 Billy M Medical Record Number: [id number] Patient Account Number: [id number] Date of Birth/Sex: Treating RN: 1945-11-14 (77 y.o. [name] Primary Care Winner Valeriano: [name] Other Clinician: Karl Bales Referring Anisten Tomassi: Treating Rosabelle Jupin/Extender: [name] [place] in Treatment: 2 HBO Safety Checklist Items Safety Checklist Consent Form Signed Patient voided / foley secured and emptied When did you last eato 0815 Last dose of injectable or oral agent 0600 metformin Ostomy pouch emptied and vented if applicable NA All implantable devices assessed, documented and approved NA Intravenous access site secured and place NA Valuables secured Linens and cotton and cotton/polyester blend (less than 51% polyester) Personal oil-based products / skin lotions / body lotions removed Wigs or hairpieces removed NA Smoking or tobacco  materials removed Books / newspapers / magazines / loose paper removed Cologne, aftershave, perfume and deodorant removed Jewelry removed (may wrap wedding band) Make-up removed NA Hair care products removed Battery operated devices (external) removed Heating patches and chemical warmers removed Titanium eyewear removed NA Nail polish cured greater than 10  hours NA Casting material cured greater than 10 hours NA Hearing aids removed NA Loose dentures or partials removed NA Prosthetics have been removed NA Patient demonstrates correct use of air break device (if applicable) Patient concerns have been addressed Patient grounding bracelet on and cord attached to chamber Specifics for Inpatients (complete in addition to above) Medication sheet sent with patient NA Intravenous medications needed or due during therapy sent with patient NA Drainage tubes (e.g. nasogastric tube or chest tube secured and vented) NA Endotracheal or Tracheotomy tube secured NA Cuff deflated of air and inflated with saline NA Airway suctioned NA Notes The safety checklist was done before the treatment was started. Electronic Signature(s) Signed: 03/12/2023 2:21:19 PM By: Karl Bales EMT Entered By: Karl Bales on 03/12/2023 14:21:19

## 2023-03-12 NOTE — Progress Notes (Signed)
GEE, KERBEL ([id number]) [id number] Page 1 of 2 Visit Report for 03/12/2023 Arrival Information Details Patient Name: Date of Service: [name], [name] 03/12/2023 10:00 A M Medical Record Number: [id number] Patient Account Number: [id number] Date of Birth/Sex: Treating RN: 05/06/1946 (77 y.o. Marlan [name] Primary Care Kylyn Mcdade: [name] Other Clinician: [name] Referring Daud Cayer: Treating Jaedynn Bohlken/Extender: [name] [place] in Treatment: 2 Visit Information History Since Last Visit All ordered tests and consults were completed: Yes Patient Arrived: [name] Added or deleted any medications: No Arrival Time: 09:24 Any new allergies or adverse reactions: No Accompanied By: spouse Had a fall or experienced change in No Transfer Assistance: None activities of daily living that may affect Patient Identification Verified: Yes risk of falls: Secondary Verification Process Completed: Yes Signs or symptoms of abuse/neglect since last visito No Patient Requires Transmission-Based Precautions: No Hospitalized since last visit: No Patient Has Alerts: No Implantable device outside of the clinic excluding No cellular tissue based products placed in the center since last visit: Pain Present Now: No Electronic Signature(s) Signed: 03/12/2023 1:58:46 PM By: [name] CHT EMT BS , , Entered By: [name] on 03/12/2023 10:58:46 -------------------------------------------------------------------------------- Encounter Discharge Information Details Patient Name: Date of Service: [name], [name] 03/12/2023 10:00 A M Medical Record Number: [id number] Patient Account Number: [id number] Date of Birth/Sex: Treating RN: November 18, 1945 (77 y.o. Marlan [name] Primary Care Zailynn Brandel: [name] Other Clinician: Karl Bales Referring Sahana Boyland: Treating Sanjana Folz/Extender:  [name] [place] in Treatment: 2 Encounter Discharge Information Items Discharge Condition: Stable Ambulatory Status: Walker Discharge Destination: Home Transportation: Private Auto Accompanied By: Wife Schedule Follow-up Appointment: Yes Clinical Summary of Care: Electronic Signature(s) Signed: 03/12/2023 2:24:07 PM By: Karl Bales EMT Entered By: Karl Bales on 03/12/2023 11:24:07 [name] ([id number]) [id number] Page 2 of 2 -------------------------------------------------------------------------------- Vitals Details Patient Name: Date of Service: [name], [name] 03/12/2023 10:00 A M Medical Record Number: [id number] Patient Account Number: [id number] Date of Birth/Sex: Treating RN: [name] Primary Care Makisha Marrin: [name] Other Clinician: [name] Referring Keynan Heffern: Treating Ebonee Stober/Extender: [name] [place] in Treatment: 2 Vital Signs Time Taken: 09:48 Temperature (F): 97.5 Height (in): 70 Pulse (bpm): 61 Weight (lbs): 196 Respiratory Rate (breaths/min): 18 Body Mass Index (BMI): 28.1 Blood Pressure (mmHg): 136/58 Capillary Blood Glucose (mg/dl): 220 Reference Range: 80 - 120 mg / dl Electronic Signature(s) Signed: 03/12/2023 1:59:15 PM By: [name] CHT EMT BS , , Entered By: [name] on 03/12/2023 10:59:15

## 2023-03-12 NOTE — Progress Notes (Addendum)
[name], [name] ([id number]) [id number] Page 1 of 2 Visit Report for 03/11/2023 Problem List Details Patient Name: Date of Service: [name], [name] 03/11/2023 10:00 A M Medical Record Number: [id number] Patient Account Number: [id number] Date of Birth/Sex: Treating RN: 1945/08/31 (77 y.o. 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 03/11/2023 Medical Record Number: [id number] Patient Account Number: [id number] Date of Birth/Sex: Treating RN: [birth year] (77 y.o. 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 E11.42 Type 2 diabetes mellitus with diabetic polyneuropathy E11.51 Type 2 diabetes mellitus with diabetic peripheral angiopathy without gangrene Facility Procedures : CPT4 Code Description: [id number]-(Facility Use Only) HBOT full body chamber, , ICD-10 Diagnosis Description 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 Modifier: her specified se er specified sev Quantity: 4 verity erity Physician Procedures : CPT4 Code Description Modifier [id number] [phone] - WC PHYS HYPERBARIC OXYGEN THERAPY ICD-10 Diagnosis Description 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

## 2023-03-12 NOTE — Progress Notes (Addendum)
[name], [name] ([id number]) [id number] Page 1 of 2 Visit Report for 03/12/2023 Problem List Details Patient Name: Date of Service: [name], [name] 03/12/2023 10:00 A M Medical Record Number: [id number] Patient Account Number: [id number] Date of Birth/Sex: Treating RN: 06/01/1945 (77 y.o. 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 03/12/2023 Medical Record Number: [id number] Patient Account Number: [id number] Date of Birth/Sex: Treating RN: 1945-08-23 (77 y.o. 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 E11.42 Type 2 diabetes mellitus with diabetic polyneuropathy E11.51 Type 2 diabetes mellitus with diabetic peripheral angiopathy without gangrene Facility Procedures : CPT4 Code Description: [id number] G0277-(Facility Use Only) HBOT full body chamber, , ICD-10 Diagnosis Description 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 Modifier: her specified se er specified sev Quantity: 4 verity erity Physician Procedures : CPT4 Code Description Modifier [id number] [phone] - WC PHYS HYPERBARIC OXYGEN THERAPY ICD-10 Diagnosis Description 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

## 2023-03-13 ENCOUNTER — Encounter (HOSPITAL_BASED_OUTPATIENT_CLINIC_OR_DEPARTMENT_OTHER): Payer: No Typology Code available for payment source | Admitting: Physician Assistant

## 2023-03-13 DIAGNOSIS — E11621 Type 2 diabetes mellitus with foot ulcer: Secondary | ICD-10-CM | POA: Diagnosis not present

## 2023-03-13 LAB — GLUCOSE, CAPILLARY
Glucose-Capillary: 165 mg/dL — ABNORMAL HIGH (ref 70–99)
Glucose-Capillary: 130 mg/dL — ABNORMAL HIGH (ref 70–99)

## 2023-03-13 NOTE — Progress Notes (Addendum)
[name] ([id number]) [id number] Page 1 of 2 Visit Report for 03/13/2023 HBO Details Patient Name: Date of Service: Billy [name], [name] 03/13/2023 10:00 Billy M Medical Record Number: [id number] Patient Account Number: [id number] Date of Birth/Sex: Treating RN: 1945-10-30 (77 y.o. [name] Primary Care Dantrell Schertzer: [name] Other Clinician: Karl Bales Referring Khair Chasteen: Treating Taelar Gronewold/Extender: Lenon Curt, [place] in Treatment: 2 HBO Treatment Course Details Treatment Course Number: 1 Ordering Aricka Goldberger: [name] T Treatments Ordered: otal 40 HBO Treatment Start Date: 03/11/2023 HBO Indication: Diabetic Ulcer(s) of the Lower Extremity Standard/Conservative Wound Care tried and failed greater than or equal to 30 days Wound #1 Left, Posterior Calcaneus , Wound #2 Right, Posterior Calcaneus HBO Treatment Details Treatment Number: 3 Patient Type: Outpatient Chamber Type: Monoplace Chamber Serial #: [id number] Treatment Protocol: 2.0 ATA with 90 minutes oxygen, and no air breaks Treatment Details Compression Rate Down: 1.0 psi / minute De-Compression Rate Up: 1.0 psi / minute Air breaks and breathing Decompress Decompress Compress Tx Pressure Begins Reached periods Begins Ends (leave unused spaces blank) Chamber Pressure (ATA 1 2 ------2 1 ) Clock Time (24 hr) 10:16 10:30 - - - - - - 12:00 12:14 Treatment Length: 118 (minutes) Treatment Segments: 4 Vital Signs Capillary Blood Glucose Reference Range: 80 - 120 mg / dl HBO Diabetic Blood Glucose Intervention Range: <131 mg/dl or >629 mg/dl Time Vitals Blood Respiratory Capillary Blood Glucose Pulse Action Type: Pulse: Temperature: Taken: Pressure: Rate: Glucose (mg/dl): Meter #: Oximetry (%) Taken: Pre 09:26 156/62 68 16 98.4 165 Post 12:22 164/71 70 18 98.2 130 Treatment Response Treatment Toleration: Well Treatment Completion  Status: Treatment Completed without Adverse Event Additional Procedure Documentation Tissue Sevierity: Fat layer exposed Electronic Signature(s) Signed: 03/13/2023 2:18:12 PM By: Karl Bales EMT Signed: 03/13/2023 4:45:44 PM By: Allen Derry PA-C Previous Signature: 03/13/2023 2:15:53 PM Version By: Karl Bales EMT Previous Signature: 03/13/2023 11:46:00 AM Version By: Karl Bales EMT Entered By: Karl Bales on 03/13/2023 11:18:12 [name] ([id number]) [id number] Page 2 of 2 -------------------------------------------------------------------------------- HBO Safety Checklist Details Patient Name: Date of Service: Billy [name], [name] 03/13/2023 10:00 Billy M Medical Record Number: [id number] Patient Account Number: [id number] Date of Birth/Sex: Treating RN: 12-Dec-1945 (77 y.o. [name] Primary Care Adelene Polivka: [name] Other Clinician: Karl Bales Referring Sumaya Riedesel: Treating Ekansh Sherk/Extender: Lenon Curt, [name] Weeks in Treatment: 2 HBO Safety Checklist Items Safety Checklist Consent Form Signed Patient voided / foley secured and emptied When did you last eato 0830 Last dose of injectable or oral agent 0730 metformin Ostomy pouch emptied and vented if applicable NA All implantable devices assessed, documented and approved NA Intravenous access site secured and place NA Valuables secured Linens and cotton and cotton/polyester blend (less than 51% polyester) Personal oil-based products / skin lotions / body lotions removed Wigs or hairpieces removed NA Smoking or tobacco materials removed Books / newspapers / magazines / loose paper removed Cologne, aftershave, perfume and deodorant removed Jewelry removed (may wrap wedding band) Make-up removed NA Hair care products removed Battery operated devices (external) removed Heating patches and chemical warmers removed Titanium eyewear  removed NA Nail polish cured greater than 10 hours NA Casting material cured greater than 10 hours NA Hearing aids removed NA Loose dentures or partials removed Prosthetics have been removed NA Patient demonstrates correct use of air break device (if applicable) Patient concerns have been addressed Patient grounding bracelet on and cord attached to chamber Specifics for Inpatients (  complete in addition to above) Medication sheet sent with patient NA Intravenous medications needed or due during therapy sent with patient NA Drainage tubes (e.g. nasogastric tube or chest tube secured and vented) NA Endotracheal or Tracheotomy tube secured NA Cuff deflated of air and inflated with saline NA Airway suctioned NA Notes The safety checklist was done before the treatment was started. Electronic Signature(s) Signed: 03/13/2023 11:45:16 AM By: Karl Bales EMT Entered By: Karl Bales on 03/13/2023 08:45:15

## 2023-03-13 NOTE — Progress Notes (Signed)
[name], [name] ([id number]) [id number] Page 1 of 2 Visit Report for 03/13/2023 Problem List Details Patient Name: Date of Service: [name], [name] 03/13/2023 10:00 Billy M Medical Record Number: [id number] Patient Account Number: [id number] Date of Birth/Sex: Treating RN: 1946/01/19 (77 y.o. 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 03/13/2023 Medical Record Number: [id number] Patient Account Number: [id number] Date of Birth/Sex: Treating RN: Sep 29, 1945 (77 y.o. 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.51 Type 2 diabetes mellitus with diabetic peripheral angiopathy without gangrene Facility Procedures : CPT4 Code Description: [id number] G0277-(Facility Use Only) HBOT full body chamber, , ICD-10 Diagnosis Description 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  Non-pressure chronic ulcer of left heel and midfoot with oth Modifier: her specified se er specified sev Quantity: 4 verity erity Physician Procedures : CPT4 Code Description Modifier [id number] [phone] - WC PHYS HYPERBARIC OXYGEN THERAPY ICD-10 Diagnosis Description 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

## 2023-03-13 NOTE — Progress Notes (Addendum)
KHAYDEN, BUHMAN ([id number]) [id number] Page 1 of 2 Visit Report for 03/13/2023 Arrival Information Details Patient Name: Date of Service: [name], [name] 03/13/2023 10:00 A M Medical Record Number: [id number] Patient Account Number: [id number] Date of Birth/Sex: Treating RN: 23-May-1945 (77 y.o. Cline Cools Primary Care Shamia Uppal: [name] Other Clinician: Karl Bales Referring Andy Allende: Treating Erykah Lippert/Extender: Lenon Curt, [place] in Treatment: 2 Visit Information History Since Last Visit All ordered tests and consults were completed: Yes Patient Arrived: [name] Added or deleted any medications: No Arrival Time: 09:20 Any new allergies or adverse reactions: No Accompanied By: Wife Had a fall or experienced change in No Transfer Assistance: None activities of daily living that may affect Patient Identification Verified: Yes risk of falls: Secondary Verification Process Completed: Yes Signs or symptoms of abuse/neglect since last visito No Patient Requires Transmission-Based Precautions: No Hospitalized since last visit: No Patient Has Alerts: No Implantable device outside of the clinic excluding No cellular tissue based products placed in the center since last visit: Pain Present Now: No Electronic Signature(s) Signed: 03/13/2023 11:42:56 AM By: Karl Bales EMT Entered By: Karl Bales on 03/13/2023 08:42:56 -------------------------------------------------------------------------------- Encounter Discharge Information Details Patient Name: Date of Service: [name], [name] 03/13/2023 10:00 A M Medical Record Number: [id number] Patient Account Number: [id number] Date of Birth/Sex: Treating RN: 1945-06-04 (77 y.o. Cline Cools Primary Care Chandlor Noecker: [name] Other Clinician: Karl Bales Referring Ayahna Solazzo: Treating Dachelle Molzahn/Extender: Lenon Curt,  [place] in Treatment: 2 Encounter Discharge Information Items Discharge Condition: Stable Ambulatory Status: Walker Discharge Destination: Home Transportation: Private Auto Accompanied By: Wife Schedule Follow-up Appointment: Yes Clinical Summary of Care: Electronic Signature(s) Signed: 03/13/2023 2:17:19 PM By: Karl Bales EMT Entered By: Karl Bales on 03/13/2023 11:17:19 [name] ([id number]) [id number] Page 2 of 2 -------------------------------------------------------------------------------- Vitals Details Patient Name: Date of Service: [name], [name] 03/13/2023 10:00 A M Medical Record Number: [id number] Patient Account Number: [id number] Date of Birth/Sex: Treating RN: [birth year] (77 y.o. Cline Cools Primary Care Callahan Wild: [name] Other Clinician: Karl Bales Referring Maryem Shuffler: Treating Shaneece Stockburger/Extender: Lenon Curt, [name] Weeks in Treatment: 2 Vital Signs Time Taken: 09:26 Temperature (F): 98.4 Height (in): 70 Pulse (bpm): 68 Weight (lbs): 196 Respiratory Rate (breaths/min): 16 Body Mass Index (BMI): 28.1 Blood Pressure (mmHg): 156/62 Capillary Blood Glucose (mg/dl): 160 Reference Range: 80 - 120 mg / dl Electronic Signature(s) Signed: 03/13/2023 11:43:46 AM By: Karl Bales EMT Entered By: Karl Bales on 03/13/2023 08:43:46

## 2023-03-14 ENCOUNTER — Encounter (HOSPITAL_BASED_OUTPATIENT_CLINIC_OR_DEPARTMENT_OTHER): Payer: No Typology Code available for payment source | Admitting: Internal Medicine

## 2023-03-14 DIAGNOSIS — E11621 Type 2 diabetes mellitus with foot ulcer: Secondary | ICD-10-CM | POA: Diagnosis not present

## 2023-03-14 LAB — GLUCOSE, CAPILLARY
Glucose-Capillary: 129 mg/dL — ABNORMAL HIGH (ref 70–99)
Glucose-Capillary: 138 mg/dL — ABNORMAL HIGH (ref 70–99)
Glucose-Capillary: 196 mg/dL — ABNORMAL HIGH (ref 70–99)

## 2023-03-14 NOTE — Progress Notes (Addendum)
JERICHO, MUNDEN ([id number]) [id number] Page 1 of 3 Visit Report for 03/14/2023 HBO Details Patient Name: Date of Service: [name], [name] 03/14/2023 10:00 A M Medical Record Number: [id number] Patient Account Number: [id number] Date of Birth/Sex: Treating RN: March 25, 1946 (77 y.o. [name] Primary Care Asahd Can: [name] Other Clinician: Karl Bales Referring Folashade Gamboa: Treating Akil Hoos/Extender: [name], [place] in Treatment: 2 HBO Treatment Course Details Treatment Course Number: 1 Ordering Taletha Twiford: [name] T Treatments Ordered: otal 40 HBO Treatment Start Date: 03/11/2023 HBO Indication: Diabetic Ulcer(s) of the Lower Extremity Standard/Conservative Wound Care tried and failed greater than or equal to 30 days Wound #1 Left, Posterior Calcaneus , Wound #2 Right, Posterior Calcaneus HBO Treatment Details Treatment Number: 4 Patient Type: Outpatient Chamber Type: Monoplace Chamber Serial #: [id number] Treatment Protocol: 2.0 ATA with 90 minutes oxygen, and no air breaks Treatment Details Compression Rate Down: 1.0 psi / minute De-Compression Rate Up: 1.0 psi / minute Air breaks and breathing Decompress Decompress Compress Tx Pressure Begins Reached periods Begins Ends (leave unused spaces blank) Chamber Pressure (ATA 1 2 ------2 1 ) Clock Time (24 hr) 10:44 10:58 - - - - - - 12:29 12:42 Treatment Length: 118 (minutes) Treatment Segments: 4 Vital Signs Capillary Blood Glucose Reference Range: 80 - 120 mg / dl HBO Diabetic Blood Glucose Intervention Range: <131 mg/dl or >086 mg/dl Type: Time Vitals Blood Pulse: Respiratory Temperature: Capillary Blood Glucose Pulse Action Taken: Pressure: Rate: Glucose (mg/dl): Meter #: Oximetry (%) Taken: Pre 08:15 131/72 64 18 98.2 Post 12:48 149/78 69 18 97.4 196 Pre 09:42 129 Patient given 8 oz Glucerna with 8oz orange juice Pre 10:10  138 Treatment Response Treatment Toleration: Well Treatment Completion Status: Treatment Completed without Adverse Event Treatment Notes The patient was seen by Dr. Leanord Hawking in the wound care clinic before coming to HBO for his treatment. Additional Procedure Documentation Tissue Sevierity: Fat layer exposed Rena Hunke Notes No concerns with treatment given.Patient was also seen for wound care review Physician HBO Attestation: I certify that I supervised this HBO treatment in accordance with Medicare guidelines. A trained emergency response team is readily available per Yes ALII, LARTER ([id number]) [id number] Page 2 of 3 hospital policies and procedures. Continue HBOT as ordered. Yes Electronic Signature(s) Signed: 03/15/2023 12:59:16 PM By: [name] MD Previous Signature: 03/14/2023 3:26:42 PM Version By: Karl Bales EMT Entered By: [name] on 03/14/2023 13:59:43 -------------------------------------------------------------------------------- HBO Safety Checklist Details Patient Name: Date of Service: [name], [name] 03/14/2023 10:00 A M Medical Record Number: [id number] Patient Account Number: [id number] Date of Birth/Sex: Treating RN: [birth year] (77 y.o. [name] Primary Care Leanny Moeckel: [name] Other Clinician: Karl Bales Referring Leslieanne Cobarrubias: Treating Emanuela Runnion/Extender: [name], [place] in Treatment: 2 HBO Safety Checklist Items Safety Checklist Consent Form Signed Patient voided / foley secured and emptied When did you last eato 0700 Last dose of injectable or oral agent 0630 metformin Ostomy pouch emptied and vented if applicable NA All implantable devices assessed, documented and approved NA Intravenous access site secured and place NA Valuables secured Linens and cotton and cotton/polyester blend (less than 51% polyester) Personal oil-based products / skin lotions /  body lotions removed Wigs or hairpieces removed NA Smoking or tobacco materials removed Books / newspapers / magazines / loose paper removed Cologne, aftershave, perfume and deodorant removed Jewelry removed (may wrap wedding band) Make-up removed NA Hair care products removed Battery operated devices (external) removed Heating patches and  chemical warmers removed Titanium eyewear removed NA Nail polish cured greater than 10 hours NA Casting material cured greater than 10 hours NA Hearing aids removed NA Loose dentures or partials removed removed by patient Prosthetics have been removed NA Patient demonstrates correct use of air break device (if applicable) Patient concerns have been addressed Patient grounding bracelet on and cord attached to chamber Specifics for Inpatients (complete in addition to above) Medication sheet sent with patient NA Intravenous medications needed or due during therapy sent with patient NA Drainage tubes (e.g. nasogastric tube or chest tube secured and vented) NA Endotracheal or Tracheotomy tube secured NA Cuff deflated of air and inflated with saline NA Airway suctioned NA Notes The safety checklist was done before the treatment was started. Electronic Signature(s) Signed: 03/14/2023 3:22:12 PM By: Etheleen Nicks ([id number]) [id number] Page 3 of 3 Entered By: Karl Bales on 03/14/2023 12:22:11

## 2023-03-14 NOTE — Progress Notes (Signed)
AMBERS, THIBERT ([id number]) [id number] Page 1 of 2 Visit Report for 03/14/2023 Arrival Information Details Patient Name: Date of Service: [name], [name] 03/14/2023 10:00 A M Medical Record Number: [id number] Patient Account Number: [id number] Date of Birth/Sex: Treating RN: [birth year] (77 y.o. [name] Primary Care Justice Aguirre: [name] Other Clinician: Karl Bales Referring Saulo Anthis: Treating Owen Pagnotta/Extender: [name], [place] in Treatment: 2 Visit Information History Since Last Visit All ordered tests and consults were completed: Yes Patient Arrived: [name] Added or deleted any medications: No Arrival Time: 07:48 Any new allergies or adverse reactions: No Accompanied By: Wife Had a fall or experienced change in No Transfer Assistance: None activities of daily living that may affect Patient Identification Verified: Yes risk of falls: Secondary Verification Process Completed: Yes Signs or symptoms of abuse/neglect since last visito No Patient Requires Transmission-Based Precautions: No Hospitalized since last visit: No Patient Has Alerts: No Implantable device outside of the clinic excluding No cellular tissue based products placed in the center since last visit: Pain Present Now: No Electronic Signature(s) Signed: 03/14/2023 3:20:13 PM By: Karl Bales EMT Entered By: Karl Bales on 03/14/2023 12:20:12 -------------------------------------------------------------------------------- Encounter Discharge Information Details Patient Name: Date of Service: [name], [name] 03/14/2023 10:00 A M Medical Record Number: [id number] Patient Account Number: [id number] Date of Birth/Sex: Treating RN: 10-26-1945 (77 y.o. [name] Primary Care Trevionne Advani: [name] Other Clinician: Karl Bales Referring Murtaza Shell: Treating Perl Kerney/Extender: [name],  [place] in Treatment: 2 Encounter Discharge Information Items Discharge Condition: Stable Ambulatory Status: Walker Discharge Destination: Home Transportation: Private Auto Accompanied By: Wife Schedule Follow-up Appointment: Yes Clinical Summary of Care: Electronic Signature(s) Signed: 03/14/2023 3:28:47 PM By: Karl Bales EMT Entered By: Karl Bales on 03/14/2023 12:28:47 [name] ([id number]) [id number] Page 2 of 2 -------------------------------------------------------------------------------- Vitals Details Patient Name: Date of Service: [name], [name] 03/14/2023 10:00 A M Medical Record Number: [id number] Patient Account Number: [id number] Date of Birth/Sex: Treating RN: September 13, 1945 (77 y.o. [name] Primary Care Josaiah Muhammed: [name] Other Clinician: Karl Bales Referring Roslynn Holte: Treating Deondra Labrador/Extender: [name], [place] in Treatment: 2 Vital Signs Time Taken: 08:15 Temperature (F): 98.2 Height (in): 70 Pulse (bpm): 64 Weight (lbs): 196 Respiratory Rate (breaths/min): 18 Body Mass Index (BMI): 28.1 Blood Pressure (mmHg): 131/72 Reference Range: 80 - 120 mg / dl Electronic Signature(s) Signed: 03/14/2023 3:20:51 PM By: Karl Bales EMT Entered By: Karl Bales on 03/14/2023 12:20:51

## 2023-03-15 ENCOUNTER — Other Ambulatory Visit: Payer: Self-pay

## 2023-03-15 ENCOUNTER — Emergency Department (HOSPITAL_COMMUNITY)
Admission: EM | Admit: 2023-03-15 | Discharge: 2023-03-16 | Disposition: A | Discharge status: Home / Self Care | Payer: No Typology Code available for payment source | Attending: Emergency Medicine | Admitting: Emergency Medicine

## 2023-03-15 ENCOUNTER — Encounter (HOSPITAL_BASED_OUTPATIENT_CLINIC_OR_DEPARTMENT_OTHER): Payer: No Typology Code available for payment source | Admitting: General Surgery

## 2023-03-15 DIAGNOSIS — Z7902 Long term (current) use of antithrombotics/antiplatelets: Secondary | ICD-10-CM | POA: Diagnosis not present

## 2023-03-15 DIAGNOSIS — Z7982 Long term (current) use of aspirin: Secondary | ICD-10-CM | POA: Insufficient documentation

## 2023-03-15 DIAGNOSIS — K648 Other hemorrhoids: Secondary | ICD-10-CM | POA: Diagnosis not present

## 2023-03-15 DIAGNOSIS — K625 Hemorrhage of anus and rectum: Secondary | ICD-10-CM | POA: Insufficient documentation

## 2023-03-15 DIAGNOSIS — I2510 Atherosclerotic heart disease of native coronary artery without angina pectoris: Secondary | ICD-10-CM | POA: Insufficient documentation

## 2023-03-15 DIAGNOSIS — N186 End stage renal disease: Secondary | ICD-10-CM | POA: Diagnosis not present

## 2023-03-15 DIAGNOSIS — E1122 Type 2 diabetes mellitus with diabetic chronic kidney disease: Secondary | ICD-10-CM | POA: Diagnosis not present

## 2023-03-15 DIAGNOSIS — Z79899 Other long term (current) drug therapy: Secondary | ICD-10-CM | POA: Insufficient documentation

## 2023-03-15 DIAGNOSIS — I120 Hypertensive chronic kidney disease with stage 5 chronic kidney disease or end stage renal disease: Secondary | ICD-10-CM | POA: Insufficient documentation

## 2023-03-15 DIAGNOSIS — Z85828 Personal history of other malignant neoplasm of skin: Secondary | ICD-10-CM | POA: Diagnosis not present

## 2023-03-15 DIAGNOSIS — E11621 Type 2 diabetes mellitus with foot ulcer: Secondary | ICD-10-CM | POA: Diagnosis not present

## 2023-03-15 LAB — CBC WITH DIFFERENTIAL/PLATELET
Abs Immature Granulocytes: 0.04 10*3/uL (ref 0.00–0.07)
Basophils Absolute: 0.1 10*3/uL (ref 0.0–0.1)
Basophils Relative: 1 %
Eosinophils Absolute: 0.3 10*3/uL (ref 0.0–0.5)
Eosinophils Relative: 5 %
HCT: 34.1 % — ABNORMAL LOW (ref 39.0–52.0)
Hemoglobin: 11 g/dL — ABNORMAL LOW (ref 13.0–17.0)
Immature Granulocytes: 1 %
Lymphocytes Relative: 22 %
Lymphs Abs: 1.5 10*3/uL (ref 0.7–4.0)
MCH: 28.5 pg (ref 26.0–34.0)
MCHC: 32.3 g/dL (ref 30.0–36.0)
MCV: 88.3 fL (ref 80.0–100.0)
Monocytes Absolute: 0.8 10*3/uL (ref 0.1–1.0)
Monocytes Relative: 11 %
Neutro Abs: 4.3 10*3/uL (ref 1.7–7.7)
Neutrophils Relative %: 60 %
Platelets: 212 10*3/uL (ref 150–400)
RBC: 3.86 MIL/uL — ABNORMAL LOW (ref 4.22–5.81)
RDW: 14.1 % (ref 11.5–15.5)
WBC: 7 10*3/uL (ref 4.0–10.5)
nRBC: 0 % (ref 0.0–0.2)

## 2023-03-15 LAB — COMPREHENSIVE METABOLIC PANEL
ALT: 32 U/L (ref 0–44)
AST: 23 U/L (ref 15–41)
Albumin: 3.8 g/dL (ref 3.5–5.0)
Alkaline Phosphatase: 82 U/L (ref 38–126)
Anion gap: 8 (ref 5–15)
BUN: 27 mg/dL — ABNORMAL HIGH (ref 8–23)
CO2: 23 mmol/L (ref 22–32)
Calcium: 8.9 mg/dL (ref 8.9–10.3)
Chloride: 108 mmol/L (ref 98–111)
Creatinine, Ser: 1.54 mg/dL — ABNORMAL HIGH (ref 0.61–1.24)
GFR, Estimated: 46 mL/min — ABNORMAL LOW (ref >60)
Glucose, Bld: 219 mg/dL — ABNORMAL HIGH (ref 70–99)
Potassium: 4.3 mmol/L (ref 3.5–5.1)
Sodium: 139 mmol/L (ref 135–145)
Total Bilirubin: 0.5 mg/dL (ref 0.3–1.2)
Total Protein: 6.5 g/dL (ref 6.5–8.1)

## 2023-03-15 LAB — GLUCOSE, CAPILLARY
Glucose-Capillary: 179 mg/dL — ABNORMAL HIGH (ref 70–99)
Glucose-Capillary: 150 mg/dL — ABNORMAL HIGH (ref 70–99)

## 2023-03-15 NOTE — Progress Notes (Signed)
LIBERATO, BOAKYE ([id number]) [id number] Page 1 of 2 Visit Report for 03/15/2023 HBO Details Patient Name: Date of Service: [name], [name] 03/15/2023 9:30 Billy M Medical Record Number: [id number] Patient Account Number: [id number] Date of Birth/Sex: Treating RN: 1945/09/12 (77 y.o. [name] Primary Care Drina Jobst: [facility], [place] Other Clinician: [name] Referring Lazaria Schaben: Treating Deveney Bayon/Extender: [name] [facility] [place] in Treatment: 2 HBO Treatment Course Details Treatment Course Number: 1 Ordering Fatisha Rabalais: [name] T Treatments Ordered: otal 40 HBO Treatment Start Date: 03/11/2023 HBO Indication: Diabetic Ulcer(s) of the Lower Extremity Standard/Conservative Wound Care tried and failed greater than or equal to 30 days Wound #1 Left, Posterior Calcaneus , Wound #2 Right, Posterior Calcaneus HBO Treatment Details Treatment Number: 5 Patient Type: Outpatient Chamber Type: Monoplace Chamber Serial #: [id number] Treatment Protocol: 2.0 ATA with 90 minutes oxygen, and no air breaks Treatment Details Compression Rate Down: 1.0 psi / minute De-Compression Rate Up: 1.0 psi / minute Air breaks and breathing Decompress Decompress Compress Tx Pressure Begins Reached periods Begins Ends (leave unused spaces blank) Chamber Pressure (ATA 1 2 ------2 1 ) Clock Time (24 hr) 09:30 09:43 - - - - - - 11:13 11:29 Treatment Length: 119 (minutes) Treatment Segments: 4 Vital Signs Capillary Blood Glucose Reference Range: 80 - 120 mg / dl HBO Diabetic Blood Glucose Intervention Range: <131 mg/dl or >782 mg/dl Type: Time Vitals Blood Respiratory Capillary Blood Glucose Pulse Action Pulse: Temperature: Taken: Pressure: Rate: Glucose (mg/dl): Meter #: Oximetry (%) Taken: Pre 09:08 134/69 71 18 97.9 179 none per protocol Post 11:32 140/73 67 18 97.2 150 none per protocol Treatment Response Treatment  Toleration: Well Treatment Completion Status: Treatment Completed without Adverse Event Treatment Notes [name] arrived with normal vital signs. he prepared for treatment. After performing Billy safety check, he was placed in the chamber which was compressed with 100% oxygen at the rate of 1 psi/min. He tolerated the treatment and subsequent decompression of the chamber at Billy rate of 1 psi/min. He denied any issues with ear equalization and/or pain caused by barotrauma. His post-treatment vital signs were within normal range. He was stable upon discharge with his wife. Additional Procedure Documentation Tissue [name] Physician HBO Attestation: I certify that I supervised this HBO treatment in accordance with Medicare guidelines. Billy trained emergency response team is readily available per Yes hospital policies and procedures. Continue HBOT as ordered. [address] [name], [name] ([id number]) [id number] Page 2 of 2 Electronic Signature(s) Signed: 03/18/2023 8:08:56 AM By: [name] MD FACS Previous Signature: 03/15/2023 1:00:17 PM Version By: [name] CHT EMT BS , , Entered By: [name] on 03/18/2023 05:08:56 -------------------------------------------------------------------------------- HBO Safety Checklist Details Patient Name: Date of Service: [name], [name] 03/15/2023 9:30 Billy M Medical Record Number: [id number] Patient Account Number: [id number] Date of Birth/Sex: Treating RN: [birth year] (77 y.o. [name] Primary Care Shaymus Eveleth: [name] Other Clinician: Karl Bales Referring Tiane Szydlowski: Treating Jovanni Eckhart/Extender: [name] [place] in Treatment: 2 HBO Safety Checklist Items Safety Checklist Consent Form Signed Patient voided / foley secured and emptied When did you last eato 0800 2 egg, 2 sausage, 2 toast Last dose of injectable or oral agent 0800 metformin Ostomy pouch  emptied and vented if applicable NA All implantable devices assessed, documented and approved NA Intravenous access site secured and place NA Valuables secured Linens and cotton and cotton/polyester blend (less than 51% polyester) Personal oil-based products / skin lotions / body  lotions removed Wigs or hairpieces removed Smoking or tobacco materials removed Books / newspapers / magazines / loose paper removed Cologne, aftershave, perfume and deodorant removed Jewelry removed (may wrap wedding band) Make-up removed NA Hair care products removed Battery operated devices (external) removed Heating patches and chemical warmers removed Titanium eyewear removed Nail polish cured greater than 10 hours NA Casting material cured greater than 10 hours NA Hearing aids removed NA Loose dentures or partials removed dentures removed Prosthetics have been removed NA Patient demonstrates correct use of air break device (if applicable) Patient concerns have been addressed Patient grounding bracelet on and cord attached to chamber Specifics for Inpatients (complete in addition to above) Medication sheet sent with patient NA Intravenous medications needed or due during therapy sent with patient NA Drainage tubes (e.g. nasogastric tube or chest tube secured and vented) NA Endotracheal or Tracheotomy tube secured NA Cuff deflated of air and inflated with saline NA Airway suctioned NA Notes Paper version used prior to treatment start. Electronic Signature(s) Signed: 03/15/2023 12:48:18 PM By: [name] CHT EMT BS , , Entered By: [name] on 03/15/2023 09:48:18

## 2023-03-15 NOTE — Progress Notes (Signed)
ANDREW, BAYUK ([id number]) [id number] Page 1 of 2 Visit Report for 03/14/2023 Problem List Details Patient Name: Date of Service: [name], [name] 03/14/2023 10:00 Billy M Medical Record Number: [id number] Patient Account Number: [id number] Date of Birth/Sex: Treating RN: Dec 05, 1945 (77 y.o. 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 03/14/2023 Medical Record Number: [id number] Patient Account Number: [id number] Date of Birth/Sex: Treating RN: 02/16/1946 (77 y.o. 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 E11.42 Type 2 diabetes mellitus with diabetic polyneuropathy E11.51 Type 2 diabetes mellitus with diabetic peripheral angiopathy without gangrene Facility Procedures : CPT4 Code Description: [id number]-(Facility Use Only) HBOT full body chamber, , ICD-10 Diagnosis Description 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 Modifier: her specified se er specified sev Quantity: 4 verity erity Physician Procedures : CPT4 Code Description Modifier [id number] [phone] - WC PHYS HYPERBARIC OXYGEN THERAPY ICD-10 Diagnosis Description 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

## 2023-03-15 NOTE — Progress Notes (Signed)
[name], [name] ([id number]) [id number] Page 1 of 1 Visit Report for 03/15/2023 Arrival Information Details Patient Name: Date of Service: [name], [name] 03/15/2023 9:30 A M Medical Record Number: [id number] Patient Account Number: [id number] Date of Birth/Sex: Treating RN: 03/12/1946 (77 y.o. [name] Primary Care Stephanee Barcomb: [name] Other Clinician: [name] Referring Giavonni Cizek: Treating Korrin Waterfield/Extender: [name] [place] in Treatment: 2 Visit Information History Since Last Visit All ordered tests and consults were completed: Yes Patient Arrived: [name] Added or deleted any medications: No Arrival Time: 09:02 Any new allergies or adverse reactions: No Accompanied By: spouse Had a fall or experienced change in No Transfer Assistance: None activities of daily living that may affect Patient Identification Verified: Yes risk of falls: Secondary Verification Process Completed: Yes Signs or symptoms of abuse/neglect since last visito No Patient Requires Transmission-Based Precautions: No Hospitalized since last visit: No Patient Has Alerts: No Implantable device outside of the clinic excluding No cellular tissue based products placed in the center since last visit: Pain Present Now: No Electronic Signature(s) Signed: 03/15/2023 12:42:18 PM By: [name] CHT EMT BS , , Entered By: [name] on 03/15/2023 09:42:18 -------------------------------------------------------------------------------- Vitals Details Patient Name: Date of Service: [name] LD, [name] 03/15/2023 9:30 A M Medical Record Number: [id number] Patient Account Number: [id number] Date of Birth/Sex: Treating RN: [birth year] (77 y.o. [name] Primary Care [name]: [name] Other Clinician: [name] Referring Solange Emry: Treating Jakyron Fabro/Extender: [name]  [place] in Treatment: 2 Vital Signs Time Taken: 09:08 Temperature (F): 97.9 Height (in): 70 Pulse (bpm): 71 Weight (lbs): 196 Respiratory Rate (breaths/min): 18 Body Mass Index (BMI): 28.1 Blood Pressure (mmHg): 134/69 Capillary Blood Glucose (mg/dl): 606 Reference Range: 80 - 120 mg / dl Electronic Signature(s) Signed: 03/15/2023 12:44:50 PM By: [name] CHT EMT BS , , Entered By: [name] on 03/15/2023 09:44:49

## 2023-03-15 NOTE — Progress Notes (Signed)
[name] ([id number]) [id number] Page 5 of 9 Pain Management and Medication Current Pain Management: Electronic Signature(s) Signed: 03/14/2023 10:24:39 AM By: Karie Schwalbe RN Entered By: Karie Schwalbe on 03/14/2023  05:33:07 -------------------------------------------------------------------------------- Patient/Caregiver Education Details Patient Name: Date of Service: [name], [name] 10/24/2024andnbsp8:15 [name] Medical Record Number: [id number] Patient Account Number: [id number] Date of Birth/Gender: Treating RN: 1945-10-08 (77 y.o. [name] Primary Care Physician: [name] Other Clinician: Referring Physician: Treating Physician/Extender: [name] [place] in Treatment: 2 Education Assessment Education Provided To: Patient Education Topics Provided Wound/Skin Impairment: Methods: Explain/Verbal Responses: See progress note, State content correctly Electronic Signature(s) Signed: 03/14/2023 10:24:39 AM By: Karie Schwalbe RN Entered By: Karie Schwalbe on 03/14/2023 07:21:54 -------------------------------------------------------------------------------- Wound Assessment Details Patient Name: Date of Service: [name], [name] 03/14/2023 8:15 [name] Medical Record Number: [id number] Patient Account Number: [id number] Date of Birth/Sex: Treating RN: 1945-07-24 (77 y.o. [name] Primary Care [name]: [name] Other Clinician: Referring [name]: Treating [name]/Extender: [name] ([id number]) [id number] Page 6 of 9 Weeks in Treatment: 2 Wound Status Wound Number: 1 Primary Diabetic Wound/Ulcer of the Lower Extremity Etiology: Wound Location: Left, Posterior Calcaneus Wound Open Wounding Event: Blister Status: Date Acquired: 01/20/2023 Comorbid Arrhythmia, Coronary Artery Disease, Hypertension, Peripheral Weeks Of Treatment: 2 History: Venous Disease, Type II Diabetes, Osteoarthritis, Neuropathy Clustered Wound: No Photos Wound Measurements Length: (cm) 1.9 Width: (cm) 1.2 Depth: (cm) 0.3 Area: (cm) 1.791 Volume: (cm) 0.537 % Reduction  in Area: -5.6% % Reduction in Volume: -58.4% Epithelialization: Medium (34-66%) Tunneling: No Undermining: No Wound Description Classification: Grade 3 Wound Margin: Distinct, outline attached Exudate Amount: Medium Exudate Type: Serosanguineous Exudate Color: red, brown Foul Odor After Cleansing: No Slough/Fibrino Yes Wound Bed Granulation Amount: Medium (34-66%) Exposed Structure Granulation Quality: Red, Pink Fascia Exposed: No Necrotic Amount: Medium (34-66%) Fat Layer (Subcutaneous Tissue) Exposed: Yes Necrotic Quality: Eschar, Adherent Slough Tendon Exposed: No Muscle Exposed: No Joint Exposed: No Bone Exposed: No Periwound Skin Texture Texture Color No Abnormalities Noted: Yes No Abnormalities Noted: Yes Moisture No Abnormalities Noted: No Dry / Scaly: Yes Treatment Notes Wound #1 (Calcaneus) Wound Laterality: Left, Posterior Cleanser Wound Cleanser Discharge Instruction: Cleanse the wound with wound cleanser prior to applying Billy clean dressing using gauze sponges, not tissue or cotton balls. Peri-Wound Care Topical Skintegrity Hydrogel 4 (oz) Discharge Instruction: Apply hydrogel as directed Primary Dressing Cutimed Sorbact 1.5 inx23/8 in Discharge Instruction: with Hydrogel into the wound bed Secondary Dressing [name], [name] ([id number]) [id number] Page 7 of 9 ALLEVYN Heel 4 1/2in x 5 1/2in / 10.5cm x 13.5cm Discharge Instruction: Apply over primary dressing as directed. Secured With [employer], 4.5x3.1 (in/yd) Discharge Instruction: Secure with Kerlix as directed. 71M Medipore H Soft Cloth Surgical T ape, 4 x 10 (in/yd) Discharge Instruction: Secure with tape as directed. Tubular Netting #5 Discharge Instruction: secure kerlix in place. Compression Wrap Compression Stockings Add-Ons Electronic Signature(s) Signed: 03/14/2023 10:24:39 AM By: Karie Schwalbe RN Entered By: Karie Schwalbe on 03/14/2023  05:24:29 -------------------------------------------------------------------------------- Wound Assessment Details Patient Name: Date of Service: [name], [name] 03/14/2023 8:15 [name] Medical Record Number: [id number] Patient Account Number: [id number] Date of Birth/Sex: Treating RN: [birth year] (77 y.o. [name] Primary Care [name]: [name] Other Clinician: Referring [name]: Treating [name]/Extender: [name] [place] in Treatment: 2 Wound Status Wound Number: 2 Primary Diabetic Wound/Ulcer of the Lower Extremity Etiology: Wound Location: Right, Posterior Calcaneus  [name], [name] ([id number]) [id number] Page 1 of 9 Visit Report for 03/14/2023 Arrival Information Details Patient Name: Date of Service: [name], [name] 03/14/2023 8:15 [name] Medical Record Number: [id number] Patient Account Number: [id number] Date of Birth/Sex: Treating RN: [birth year] (77 y.o. [name] Primary Care [name]: [name] Other Clinician: Referring [name]: Treating [name]/Extender: [name] [place] in Treatment: 2 Visit Information History Since Last Visit Added or deleted any medications: No Patient Arrived: [name] Any new allergies or adverse reactions: No Arrival Time: 08:05 Had Billy fall or experienced change in No Accompanied By: [name] activities of daily living that may affect Transfer Assistance: None risk of falls: Patient Identification Verified: Yes Signs or symptoms of abuse/neglect since last visito No Patient Requires Transmission-Based Precautions: No Hospitalized since last visit: No Patient Has Alerts: No Implantable device outside of the clinic excluding No cellular tissue based products placed in the center since last visit: Has Dressing in Place as Prescribed: Yes Pain Present Now: No Electronic Signature(s) Signed: 03/14/2023 10:24:39 AM By: Karie Schwalbe RN Entered By: Karie Schwalbe on 03/14/2023 05:28:04 -------------------------------------------------------------------------------- Encounter Discharge Information Details Patient Name: Date of Service: [name], [name] 03/14/2023 8:15 [name] Medical Record Number: [id number] Patient Account Number: [id number] Date of Birth/Sex: Treating RN: 1946/03/12 (77 y.o. [name] Primary Care Ritchard Paragas: [name] Other Clinician: Referring Pansy Ostrovsky: Treating Delise Simenson/Extender: [name] [place] in Treatment: 2 Encounter Discharge Information Items Post Procedure  Vitals Discharge Condition: Stable Temperature (F): 98.2 Ambulatory Status: Walker Pulse (bpm): 64 Discharge Destination: Home Respiratory Rate (breaths/min): 18 Transportation: Private Auto Blood Pressure (mmHg): 131/74 Accompanied By: [name] Schedule Follow-up Appointment: No Clinical Summary of Care: Patient Declined Electronic Signature(s) Signed: 03/14/2023 10:24:39 AM By: Karie Schwalbe RN Entered By: Karie Schwalbe on 03/14/2023 07:23:22 [name] ([id number]) [id number] Page 2 of 9 -------------------------------------------------------------------------------- Lower Extremity Assessment Details Patient Name: Date of Service: [name], [name] 03/14/2023 8:15 [name] Medical Record Number: [id number] Patient Account Number: [id number] Date of Birth/Sex: Treating RN: 1945-05-27 (77 y.o. [name] Primary Care Mana Haberl: [facility], [place] Other Clinician: Referring Dillan Candela: Treating Abygale Karpf/Extender: [name] [place] in Treatment: 2 Edema Assessment Assessed: [Left: No] [Right: No] Edema: [Left: No] [Right: No] Calf Left: Right: Point of Measurement: From Medial Instep 33 cm 35.5 cm Ankle Left: Right: Point of Measurement: From Medial Instep 21 cm 21.2 cm Vascular Assessment Pulses: Dorsalis Pedis Palpable: [Left:Yes] [Right:Yes] Extremity colors, hair growth, and conditions: Extremity Color: [Left:Normal] [Right:Normal] Hair Growth on Extremity: [Left:No] [Right:No] Temperature of Extremity: [Left:Warm] [Right:Warm] Capillary Refill: [Left:< 3 seconds] [Right:< 3 seconds] Dependent Rubor: [Left:No No] [Right:No No] Toe Nail Assessment Left: Right: Thick: Yes Yes Discolored: No No Deformed: No No Improper Length and Hygiene: No No Electronic Signature(s) Signed: 03/14/2023 10:24:39 AM By: Karie Schwalbe RN Entered By: Karie Schwalbe on 03/14/2023  05:35:12 -------------------------------------------------------------------------------- Multi Wound Chart Details Patient Name: Date of Service: [name], [name] 03/14/2023 8:15 [name] Medical Record Number: [id number] Patient Account Number: [id number] Date of Birth/Sex: Treating RN: 1946/02/23 (77 y.o. [name] Primary Care Tidus Upchurch: [name] Other Clinician: Referring Gracilyn Gunia: Treating Hope Brandenburger/Extender: Kathaleen Grinder in Treatment: 2 Vital Signs Height(in): 70 Pulse(bpm): 64 Weight(lbs): 196 Blood Pressure(mmHg): 131/72 Body Mass Index(BMI): 28.1 [name], [name] ([id number]) [id number].pdf Page 3 of 9 Temperature(F): 98.2 Respiratory Rate(breaths/min): 18 [1:Photos:] [[name]:[name]] Left, Posterior Calcaneus Right, Posterior Calcaneus [name] Wound Location: Blister Blister [name] Wounding  [name] ([id number]) [id number] Page 5 of 9 Pain Management and Medication Current Pain Management: Electronic Signature(s) Signed: 03/14/2023 10:24:39 AM By: Karie Schwalbe RN Entered By: Karie Schwalbe on 03/14/2023  05:33:07 -------------------------------------------------------------------------------- Patient/Caregiver Education Details Patient Name: Date of Service: [name], [name] 10/24/2024andnbsp8:15 [name] Medical Record Number: [id number] Patient Account Number: [id number] Date of Birth/Gender: Treating RN: 1945-10-08 (77 y.o. [name] Primary Care Physician: [name] Other Clinician: Referring Physician: Treating Physician/Extender: [name] [place] in Treatment: 2 Education Assessment Education Provided To: Patient Education Topics Provided Wound/Skin Impairment: Methods: Explain/Verbal Responses: See progress note, State content correctly Electronic Signature(s) Signed: 03/14/2023 10:24:39 AM By: Karie Schwalbe RN Entered By: Karie Schwalbe on 03/14/2023 07:21:54 -------------------------------------------------------------------------------- Wound Assessment Details Patient Name: Date of Service: [name], [name] 03/14/2023 8:15 [name] Medical Record Number: [id number] Patient Account Number: [id number] Date of Birth/Sex: Treating RN: 1945-07-24 (77 y.o. [name] Primary Care [name]: [name] Other Clinician: Referring [name]: Treating [name]/Extender: [name] ([id number]) [id number] Page 6 of 9 Weeks in Treatment: 2 Wound Status Wound Number: 1 Primary Diabetic Wound/Ulcer of the Lower Extremity Etiology: Wound Location: Left, Posterior Calcaneus Wound Open Wounding Event: Blister Status: Date Acquired: 01/20/2023 Comorbid Arrhythmia, Coronary Artery Disease, Hypertension, Peripheral Weeks Of Treatment: 2 History: Venous Disease, Type II Diabetes, Osteoarthritis, Neuropathy Clustered Wound: No Photos Wound Measurements Length: (cm) 1.9 Width: (cm) 1.2 Depth: (cm) 0.3 Area: (cm) 1.791 Volume: (cm) 0.537 % Reduction  in Area: -5.6% % Reduction in Volume: -58.4% Epithelialization: Medium (34-66%) Tunneling: No Undermining: No Wound Description Classification: Grade 3 Wound Margin: Distinct, outline attached Exudate Amount: Medium Exudate Type: Serosanguineous Exudate Color: red, brown Foul Odor After Cleansing: No Slough/Fibrino Yes Wound Bed Granulation Amount: Medium (34-66%) Exposed Structure Granulation Quality: Red, Pink Fascia Exposed: No Necrotic Amount: Medium (34-66%) Fat Layer (Subcutaneous Tissue) Exposed: Yes Necrotic Quality: Eschar, Adherent Slough Tendon Exposed: No Muscle Exposed: No Joint Exposed: No Bone Exposed: No Periwound Skin Texture Texture Color No Abnormalities Noted: Yes No Abnormalities Noted: Yes Moisture No Abnormalities Noted: No Dry / Scaly: Yes Treatment Notes Wound #1 (Calcaneus) Wound Laterality: Left, Posterior Cleanser Wound Cleanser Discharge Instruction: Cleanse the wound with wound cleanser prior to applying Billy clean dressing using gauze sponges, not tissue or cotton balls. Peri-Wound Care Topical Skintegrity Hydrogel 4 (oz) Discharge Instruction: Apply hydrogel as directed Primary Dressing Cutimed Sorbact 1.5 inx23/8 in Discharge Instruction: with Hydrogel into the wound bed Secondary Dressing [name], [name] ([id number]) [id number] Page 7 of 9 ALLEVYN Heel 4 1/2in x 5 1/2in / 10.5cm x 13.5cm Discharge Instruction: Apply over primary dressing as directed. Secured With [employer], 4.5x3.1 (in/yd) Discharge Instruction: Secure with Kerlix as directed. 71M Medipore H Soft Cloth Surgical T ape, 4 x 10 (in/yd) Discharge Instruction: Secure with tape as directed. Tubular Netting #5 Discharge Instruction: secure kerlix in place. Compression Wrap Compression Stockings Add-Ons Electronic Signature(s) Signed: 03/14/2023 10:24:39 AM By: Karie Schwalbe RN Entered By: Karie Schwalbe on 03/14/2023  05:24:29 -------------------------------------------------------------------------------- Wound Assessment Details Patient Name: Date of Service: [name], [name] 03/14/2023 8:15 [name] Medical Record Number: [id number] Patient Account Number: [id number] Date of Birth/Sex: Treating RN: [birth year] (77 y.o. [name] Primary Care [name]: [name] Other Clinician: Referring [name]: Treating [name]/Extender: [name] [place] in Treatment: 2 Wound Status Wound Number: 2 Primary Diabetic Wound/Ulcer of the Lower Extremity Etiology: Wound Location: Right, Posterior Calcaneus  [name], [name] ([id number]) [id number] Page 1 of 9 Visit Report for 03/14/2023 Arrival Information Details Patient Name: Date of Service: [name], [name] 03/14/2023 8:15 [name] Medical Record Number: [id number] Patient Account Number: [id number] Date of Birth/Sex: Treating RN: [birth year] (77 y.o. [name] Primary Care [name]: [name] Other Clinician: Referring [name]: Treating [name]/Extender: [name] [place] in Treatment: 2 Visit Information History Since Last Visit Added or deleted any medications: No Patient Arrived: [name] Any new allergies or adverse reactions: No Arrival Time: 08:05 Had Billy fall or experienced change in No Accompanied By: [name] activities of daily living that may affect Transfer Assistance: None risk of falls: Patient Identification Verified: Yes Signs or symptoms of abuse/neglect since last visito No Patient Requires Transmission-Based Precautions: No Hospitalized since last visit: No Patient Has Alerts: No Implantable device outside of the clinic excluding No cellular tissue based products placed in the center since last visit: Has Dressing in Place as Prescribed: Yes Pain Present Now: No Electronic Signature(s) Signed: 03/14/2023 10:24:39 AM By: Karie Schwalbe RN Entered By: Karie Schwalbe on 03/14/2023 05:28:04 -------------------------------------------------------------------------------- Encounter Discharge Information Details Patient Name: Date of Service: [name], [name] 03/14/2023 8:15 [name] Medical Record Number: [id number] Patient Account Number: [id number] Date of Birth/Sex: Treating RN: 1946/03/12 (77 y.o. [name] Primary Care Ritchard Paragas: [name] Other Clinician: Referring Pansy Ostrovsky: Treating Delise Simenson/Extender: [name] [place] in Treatment: 2 Encounter Discharge Information Items Post Procedure  Vitals Discharge Condition: Stable Temperature (F): 98.2 Ambulatory Status: Walker Pulse (bpm): 64 Discharge Destination: Home Respiratory Rate (breaths/min): 18 Transportation: Private Auto Blood Pressure (mmHg): 131/74 Accompanied By: [name] Schedule Follow-up Appointment: No Clinical Summary of Care: Patient Declined Electronic Signature(s) Signed: 03/14/2023 10:24:39 AM By: Karie Schwalbe RN Entered By: Karie Schwalbe on 03/14/2023 07:23:22 [name] ([id number]) [id number] Page 2 of 9 -------------------------------------------------------------------------------- Lower Extremity Assessment Details Patient Name: Date of Service: [name], [name] 03/14/2023 8:15 [name] Medical Record Number: [id number] Patient Account Number: [id number] Date of Birth/Sex: Treating RN: 1945-05-27 (77 y.o. [name] Primary Care Mana Haberl: [facility], [place] Other Clinician: Referring Dillan Candela: Treating Abygale Karpf/Extender: [name] [place] in Treatment: 2 Edema Assessment Assessed: [Left: No] [Right: No] Edema: [Left: No] [Right: No] Calf Left: Right: Point of Measurement: From Medial Instep 33 cm 35.5 cm Ankle Left: Right: Point of Measurement: From Medial Instep 21 cm 21.2 cm Vascular Assessment Pulses: Dorsalis Pedis Palpable: [Left:Yes] [Right:Yes] Extremity colors, hair growth, and conditions: Extremity Color: [Left:Normal] [Right:Normal] Hair Growth on Extremity: [Left:No] [Right:No] Temperature of Extremity: [Left:Warm] [Right:Warm] Capillary Refill: [Left:< 3 seconds] [Right:< 3 seconds] Dependent Rubor: [Left:No No] [Right:No No] Toe Nail Assessment Left: Right: Thick: Yes Yes Discolored: No No Deformed: No No Improper Length and Hygiene: No No Electronic Signature(s) Signed: 03/14/2023 10:24:39 AM By: Karie Schwalbe RN Entered By: Karie Schwalbe on 03/14/2023  05:35:12 -------------------------------------------------------------------------------- Multi Wound Chart Details Patient Name: Date of Service: [name], [name] 03/14/2023 8:15 [name] Medical Record Number: [id number] Patient Account Number: [id number] Date of Birth/Sex: Treating RN: 1946/02/23 (77 y.o. [name] Primary Care Tidus Upchurch: [name] Other Clinician: Referring Gracilyn Gunia: Treating Hope Brandenburger/Extender: Kathaleen Grinder in Treatment: 2 Vital Signs Height(in): 70 Pulse(bpm): 64 Weight(lbs): 196 Blood Pressure(mmHg): 131/72 Body Mass Index(BMI): 28.1 [name], [name] ([id number]) [id number].pdf Page 3 of 9 Temperature(F): 98.2 Respiratory Rate(breaths/min): 18 [1:Photos:] [[name]:[name]] Left, Posterior Calcaneus Right, Posterior Calcaneus [name] Wound Location: Blister Blister [name] Wounding

## 2023-03-15 NOTE — ED Triage Notes (Signed)
Patient reports bleeding hemorrhoids for 3 days , denies abdominal pain or injury .

## 2023-03-16 LAB — POC OCCULT BLOOD, ED: Fecal Occult Bld: POSITIVE — AB

## 2023-03-16 MED ORDER — DOCUSATE SODIUM 100 MG PO CAPS: 100.0000 mg | ORAL_CAPSULE | Freq: Two times a day (BID) | ORAL | 0 refills | Status: DC

## 2023-03-16 MED ORDER — DOCUSATE SODIUM 100 MG PO CAPS: 100.0000 mg | ORAL_CAPSULE | Freq: Two times a day (BID) | ORAL | 0 refills | Status: AC

## 2023-03-16 NOTE — ED Provider Notes (Signed)
EMERGENCY DEPARTMENT AT [facility] Provider Note   CSN: [id number] Arrival date & time: 03/15/23  1911     History  Chief Complaint  Patient presents with   Rectal Bleeding    Hemorroids    [name] is a 77 y.o. male.  The history is provided by the patient.  Patient with extensive history including CAD, diabetes presents with rectal bleeding.  Patient reports he has frequent rectal bleeding due to hemorrhoids.  However the past 3 days that has appeared to be increasing every time he wipes after a bowel movement.  No melena.  No abdominal pain or vomiting.  No weakness or dizziness.  Patient does take Plavix. Patient reports that he has had constipation recently has been sitting on the toilet for up to an hour trying to defecate. He reports has not been any blood in the toilet or stool, but only when he wipes.    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 PAC's    GERD (gastroesophageal reflux disease)    Heart murmur    History of kidney stones 07-31-13   x 1   Hyperlipidemia    Hypertension    Peripheral vascular disease (HCC)    Plantar fasciitis of left foot 07-31-13   left leg   PONV (postoperative nausea and vomiting)    Rheumatic fever    when 77 years old    Home Medications Prior to Admission medications   Medication Sig Start Date End Date Taking? Authorizing Provider  docusate sodium (COLACE) 100 MG capsule Take 1 capsule (100 mg total) by mouth every 12  (twelve) hours. 03/16/23  Yes Zadie Rhine, MD  acetaminophen (TYLENOL) 500 MG tablet Take 2 tablets (1,000 mg total) by mouth every 6 (six) hours as needed. Patient not taking: Reported on 11/02/2022 07/30/14   Arby Barrette, MD  Alogliptin Benzoate 25 MG TABS Take 12.5 mg by mouth every evening.    [provider]  amLODipine (NORVASC) 10 MG tablet Take 1 tablet (10 mg total) by mouth daily. 08/10/20   Marguerita Merles Latif, DO  aspirin EC 81 MG tablet Take 81 mg by mouth daily. Swallow whole.    [provider]  carvedilol (COREG) 6.25 MG tablet Take 6.25 mg by mouth 2 (two) times daily with a meal.    [provider]  cetirizine (ZYRTEC) 10 MG tablet Take 10 mg by mouth daily.    [provider]  cholecalciferol (VITAMIN D3) 25 MCG (1000 UNIT) tablet Take 2,000 Units by mouth every 14 (fourteen) days.    [provider]  clopidogrel (PLAVIX) 75 MG tablet Take 1 tablet (75 mg total) by mouth daily. 08/11/20   Marguerita Merles Latif, DO  donepezil (ARICEPT) 10 MG tablet Take 10 mg by mouth in the morning.    [provider]  DULoxetine (CYMBALTA) 20 MG capsule Take 20 mg by mouth every evening.  [provider]  famotidine (PEPCID) 20 MG tablet Take 20 mg by mouth at bedtime.    [provider]  glipiZIDE (GLUCOTROL) 5 MG tablet Take 2.5 mg by mouth 2 (two) times daily before a meal.    [provider]  LACTOBACILLUS PROBIOTIC PO Take 2 tablets by mouth in the morning and at bedtime.    [provider]  losartan (COZAAR) 100 MG tablet Take 100 mg by mouth in the morning.    [provider]  memantine (NAMENDA) 5 MG tablet Take 5 mg by mouth 2 (two) times daily.    [provider]  metFORMIN (GLUCOPHAGE) 1000 MG tablet Take 1,000 mg by mouth daily.    [provider]  Multiple Vitamin (MULTIVITAMIN WITH MINERALS) TABS tablet Take 1 tablet by mouth daily.    [provider]   nitroGLYCERIN (NITROSTAT) 0.4 MG SL tablet Place 0.4 mg under the tongue every 5 (five) minutes as needed for chest pain.    [provider]  Omega-3 Fatty Acids (FISH OIL) 1000 MG CAPS Take 1 capsule by mouth daily.    [provider]  pravastatin (PRAVACHOL) 40 MG tablet Take 40 mg by mouth in the morning.    [provider]  tamsulosin (FLOMAX) 0.4 MG CAPS capsule Take 0.4 mg by mouth every evening.    [provider]  vitamin B-12 (CYANOCOBALAMIN) 500 MCG tablet Take 500 mcg by mouth daily.    [provider]      Allergies    Contrast media [iodinated contrast media] and Quinine derivatives    Review of Systems   Review of Systems  Constitutional:  Negative for fatigue.  Gastrointestinal:  Negative for abdominal pain and vomiting.  Neurological:  Negative for dizziness.    Physical Exam Updated Vital Signs BP (!) 169/121   Pulse 72   Temp 98.2 F (36.8 C)   Resp 18   SpO2 100%  Physical Exam CONSTITUTIONAL: Elderly, no acute distress HEAD: Normocephalic/atraumatic EYES: EOMI/PERRL, conjunctiva pink ENMT: Mucous membranes moist NECK: supple no meningeal signs ABDOMEN: soft, nontender, no rebound or guarding, bowel sounds noted throughout abdomen Rectal-chaperoned by nurse Internal hemorrhoids palpated.  No rectal mass or abscess.  No melena or gross blood is noted NEURO: Pt is awake/alert/appropriate, moves all extremitiesx4.  No facial droop.   SKIN: warm, color normal PSYCH: no abnormalities of mood noted, alert and oriented to situation  ED Results / Procedures / Treatments   Labs (all labs ordered are listed, but only abnormal results are displayed) Labs Reviewed  CBC WITH DIFFERENTIAL/PLATELET - Abnormal; Notable for the following components:      Result Value   RBC 3.86 (*)    Hemoglobin 11.0 (*)    HCT 34.1 (*)    All other components within normal limits  COMPREHENSIVE METABOLIC PANEL - Abnormal; Notable for  the following components:   Glucose, Bld 219 (*)    BUN 27 (*)    Creatinine, Ser 1.54 (*)    GFR, Estimated 46 (*)    All other components within normal limits  POC OCCULT BLOOD, ED - Abnormal; Notable for the following components:   Fecal Occult Bld POSITIVE (*)    All other components within normal limits    EKG None  Radiology No results found.  Procedures Procedures    Medications Ordered in ED Medications - No data to display  ED Course/ Medical Decision Making/ A&P Clinical Course as of 03/16/23 0347  Sat  Mar 16, 2023  0345 Glucose(!): 219 Mild hyperglycemia [DW]  0345 Creatinine(!): 1.54 Chronic renal insufficiency [DW]    Clinical Course User Index [DW] Zadie Rhine, MD                                 Medical Decision Making Amount and/or Complexity of Data Reviewed Labs: ordered. Decision-making details documented in ED Course.  Risk OTC drugs.   Patient presents for increased rectal bleeding.  He reports that usually occurs while wiping.  Denies any blood in the toilet or stool.  No melena.  Patient reports he gets this frequently though he is realized that his hemorrhoids may be worsening.  He reports he is constipated and sitting on the toilet for over an hour which is likely contributing to his symptoms.  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  Patient will be placed on a stool softener. He has no signs at this time of a critical GI bleed. His labs are overall reassuring and no signs of worsening anemia         Final Clinical Impression(s) / ED Diagnoses Final diagnoses:  Rectal bleeding    Rx / DC Orders ED Discharge Orders          Ordered    docusate sodium (COLACE) 100 MG capsule  Every 12 hours        03/16/23 0347              Zadie Rhine, MD 03/16/23 [phone]

## 2023-03-18 ENCOUNTER — Encounter (HOSPITAL_BASED_OUTPATIENT_CLINIC_OR_DEPARTMENT_OTHER): Payer: No Typology Code available for payment source | Admitting: Internal Medicine

## 2023-03-18 DIAGNOSIS — E11621 Type 2 diabetes mellitus with foot ulcer: Secondary | ICD-10-CM | POA: Diagnosis not present

## 2023-03-18 LAB — GLUCOSE, CAPILLARY
Glucose-Capillary: 181 mg/dL — ABNORMAL HIGH (ref 70–99)
Glucose-Capillary: 179 mg/dL — ABNORMAL HIGH (ref 70–99)
Glucose-Capillary: 107 mg/dL — ABNORMAL HIGH (ref 70–99)

## 2023-03-18 NOTE — Progress Notes (Signed)
VIREN, SCHENONE ([id number]) [id number] Page 1 of 1 Visit Report for 03/15/2023 SuperBill Details Patient Name: Date of Service: [name], [name] 03/15/2023 Medical Record Number: [id number] Patient Account Number: [id number] Date of Birth/Sex: Treating RN: August 31, 1945 (77 y.o. 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 E11.42 Type 2 diabetes mellitus with diabetic polyneuropathy E11.51 Type 2 diabetes mellitus with diabetic peripheral angiopathy without gangrene Facility Procedures CPT4 Code Description Modifier Quantity [id number]-(Facility Use Only) HBOT full body chamber, , 4 ICD-10 Diagnosis Description 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 E11.42 Type 2 diabetes mellitus with diabetic polyneuropathy Physician Procedures Quantity CPT4 Code Description Modifier [id number] - WC PHYS HYPERBARIC OXYGEN THERAPY 1 ICD-10 Diagnosis Description 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

## 2023-03-18 NOTE — Progress Notes (Signed)
[name], [name] ([id number]) [id number] Page 1 of 3 Visit Report for 03/18/2023 HBO Details Patient Name: Date of Service: Billy [name], RO Billy S. 03/18/2023 10:00 Billy M Medical Record Number: [id number] Patient Account Number: [id number] Date of Birth/Sex: Treating RN: February 20, 1946 (77 y.o. [name] Primary Care [name]: Clinic, [name] Other Clinician: Haywood [name] Referring [name]: Treating [name]: [name] [facility], [name] in Treatment: 2 HBO Treatment Course Details Treatment Course Number: 1 Ordering [name]: [name] T Treatments Ordered: otal 40 HBO Treatment Start Date: 03/11/2023 HBO Indication: Diabetic Ulcer(s) of the Lower Extremity Standard/Conservative Wound Care tried and failed greater than or equal to 30 days Wound #1 Left, Posterior Calcaneus , Wound #2 Right, Posterior Calcaneus HBO Treatment Details Treatment Number: 6 Patient Type: Outpatient Chamber Type: Monoplace Chamber Serial #: [id number] Treatment Protocol: 2.0 ATA with 90 minutes oxygen, and no air breaks Treatment Details Compression Rate Down: 1.0 psi / minute De-Compression Rate Up: 1.0 psi / minute Air breaks and breathing Decompress Decompress Compress Tx Pressure Begins Reached periods Begins Ends (leave unused spaces blank) Chamber Pressure (ATA 1 2 ------2 1 ) Clock Time (24 hr) 10:40 11:08 - - - - - - 12:38 12:54 Treatment Length: 134 (minutes) Treatment Segments: 4 Vital Signs Capillary Blood Glucose Reference Range: 80 - 120 mg / dl HBO Diabetic Blood Glucose Intervention Range: <131 mg/dl or >865 mg/dl Type: Time Vitals Blood Respiratory Capillary Blood Glucose Pulse Action Pulse: Temperature: Taken: Pressure: Rate: Glucose (mg/dl): Meter #: Oximetry (%) Taken: Pre 09:41 120/66 66 18 98.2 181 none per protocol Pre 10:39 179 proceed with HBO Post 13:00 164/79 69 18 97.9 107 none per  protocol. Treatment Response Treatment Toleration: Well Treatment Completion Status: Treatment Completed without Adverse Event Treatment Notes [name] arrived with normal vital signs. He prepared for treatment. There was Billy slight delay in treatment start, so, blood glucose was taken again prior to treatment as Billy precaution. After performing Billy safety check, patient was placed in the chamber which was compressed with 100% oxygen at Billy rate of 1 psi/min, normal flow rate. Upon reaching 6 psig, patient stated that he was experiencing pressure in the middle ear. Patient stated that he could still hear normally. Pressure reversed until reaching 3 psig at 1051. Pressurization proceeded at Billy reduced rate with flow rate on 450 L/min slowing travel rate down to 0.74 psi/min. He denied any further issues with ear equalization and/or pain associated with barotrauma. He tolerated the treatment and subsequent decompression of the chamber at the rate of 1 psi/min. Post-treatment vital signs were within normal range. He was stable upon discharge with his wife. Additional Procedure Documentation Tissue Sevierity: Fat layer exposed [name] Notes No concerns with treatment given [name] ([id number]) [id number] Page 2 of 3 Physician HBO Attestation: I certify that I supervised this HBO treatment in accordance with Medicare guidelines. Billy trained emergency response team is readily available per Yes hospital policies and procedures. Continue HBOT as ordered. Yes Electronic Signature(s) Signed: 03/19/2023 8:53:04 AM By: Billy Najjar MD Previous Signature: 03/18/2023 3:58:50 PM Version By: [name] , , Previous Signature: 03/18/2023 2:20:26 PM Version By: [name] , , Entered By: [name] on 03/18/2023 16:42:26 -------------------------------------------------------------------------------- HBO Safety Checklist Details Patient  Name: Date of Service: [name] LD, RO Billy S. 03/18/2023 10:00 Billy M Medical Record Number: [id number] Patient Account Number: [id number] Date of Birth/Sex: Treating RN: December 26, 1945 (77 y.o. [name] Primary Care  [name]: Clinic, [name] Other Clinician: Haywood [name] Referring [name]: Treating [name]/Extender: [name] [facility], [name] in Treatment: 2 HBO Safety Checklist Items Safety Checklist Consent Form Signed Patient voided / foley secured and emptied When did you last eato 0804 Last dose of injectable or oral agent 0745 - metformin Ostomy pouch emptied and vented if applicable NA All implantable devices assessed, documented and approved NA Intravenous access site secured and place NA Valuables secured Linens and cotton and cotton/polyester blend (less than 51% polyester) Personal oil-based products / skin lotions / body lotions removed Wigs or hairpieces removed NA Smoking or tobacco materials removed NA Books / newspapers / magazines / loose paper removed Cologne, aftershave, perfume and deodorant removed Jewelry removed (may wrap wedding band) Make-up removed NA Hair care products removed NA Battery operated devices (external) removed Heating patches and chemical warmers removed Titanium eyewear removed Nail polish cured greater than 10 hours Casting material cured greater than 10 hours Hearing aids removed NA Loose dentures or partials removed dentures removed Prosthetics have been removed NA Patient demonstrates correct use of air break device (if applicable) Patient concerns have been addressed Patient grounding bracelet on and cord attached to chamber Specifics for Inpatients (complete in addition to above) Medication sheet sent with patient NA Intravenous medications needed or due during therapy sent with patient NA Drainage tubes (e.g. nasogastric tube or chest tube secured and vented) NA Endotracheal or  Tracheotomy tube secured NA Cuff deflated of air and inflated with saline NA Airway suctioned NA Notes Paper version used prior to treatment start. LATON, GLASPER ([id number]) [id number] Page 3 of 3 Electronic Signature(s) Signed: 03/18/2023 2:14:57 PM By: [name] , , Entered By: [name] on 03/18/2023 14:14:56

## 2023-03-18 NOTE — Progress Notes (Signed)
ESAW, DIEL ([id number]) [id number] Page 1 of 2 Visit Report for 03/18/2023 Arrival Information Details Patient Name: Date of Service: [name], [name] 03/18/2023 10:00 A M Medical Record Number: [id number] Patient Account Number: [id number] Date of Birth/Sex: Treating RN: 03/02/1946 (77 y.o. Bayard Hugger, [name] Primary Care Johnice Riebe: [name] Other Clinician: [name] Referring Jeanifer Halliday: Treating Nasri Boakye/Extender: [name], [place] in Treatment: 2 Visit Information History Since Last Visit All ordered tests and consults were completed: Yes Patient Arrived: [name] Added or deleted any medications: No Arrival Time: 09:17 Any new allergies or adverse reactions: No Accompanied By: spouse Had a fall or experienced change in No Transfer Assistance: None activities of daily living that may affect Patient Identification Verified: Yes risk of falls: Secondary Verification Process Completed: Yes Signs or symptoms of abuse/neglect since last visito No Patient Requires Transmission-Based Precautions: No Hospitalized since last visit: No Patient Has Alerts: No Implantable device outside of the clinic excluding No cellular tissue based products placed in the center since last visit: Pain Present Now: No Electronic Signature(s) Signed: 03/18/2023 2:01:45 PM By: [name] CHT EMT BS , , Entered By: [name] on 03/18/2023 14:01:45 -------------------------------------------------------------------------------- Encounter Discharge Information Details Patient Name: Date of Service: [name], [name] 03/18/2023 10:00 A M Medical Record Number: [id number] Patient Account Number: [id number] Date of Birth/Sex: Treating RN: 1945/07/13 (77 y.o. [name] Primary Care Olanda Downie: [name] Other Clinician: [name] Referring Abbigayle Toole: Treating Hanaa Payes/Extender: [name] [place] in Treatment: 2 Encounter Discharge Information Items Discharge Condition: Stable Ambulatory Status: Walker Discharge Destination: Home Transportation: Private Auto Accompanied By: spouse Schedule Follow-up Appointment: No Clinical Summary of Care: Electronic Signature(s) Signed: 03/18/2023 3:59:47 PM By: [name] CHT EMT BS , , Entered By: [name] on 03/18/2023 15:59:47 [name] ([id number]) [id number] Page 2 of 2 -------------------------------------------------------------------------------- Vitals Details Patient Name: Date of Service: [name], [name] 03/18/2023 10:00 A M Medical Record Number: [id number] Patient Account Number: [id number] Date of Birth/Sex: Treating RN: [birth year] (77 y.o. [name] Primary Care Isaiah Torok: [name] Other Clinician: [name] Referring Jilda Kress: Treating Julyanna Scholle/Extender: [name] [place] in Treatment: 2 Vital Signs Time Taken: 09:41 Temperature (F): 98.2 Height (in): 70 Pulse (bpm): 66 Weight (lbs): 196 Respiratory Rate (breaths/min): 18 Body Mass Index (BMI): 28.1 Blood Pressure (mmHg): 120/66 Capillary Blood Glucose (mg/dl): 063 Reference Range: 80 - 120 mg / dl Electronic Signature(s) Signed: 03/18/2023 2:06:26 PM By: [name] CHT EMT BS , , Entered By: [name] on 03/18/2023 14:06:25

## 2023-03-19 ENCOUNTER — Ambulatory Visit (HOSPITAL_BASED_OUTPATIENT_CLINIC_OR_DEPARTMENT_OTHER): Payer: No Typology Code available for payment source | Admitting: Internal Medicine

## 2023-03-19 ENCOUNTER — Encounter (HOSPITAL_BASED_OUTPATIENT_CLINIC_OR_DEPARTMENT_OTHER): Payer: No Typology Code available for payment source | Admitting: General Surgery

## 2023-03-19 NOTE — Progress Notes (Signed)
[name], [name] ([id number]) [id number] Page 1 of 9 Visit Report for 03/14/2023 Debridement Details Patient Name: Date of Service: Billy [name], [name] 03/14/2023 8:15 Billy M Medical Record Number: [id number] Patient Account Number: [id number] Date of Birth/Sex: Treating RN: Nov 25, 1945 (77 y.o. M) Primary Care Provider: [name] Other Clinician: Referring Provider: Treating Provider/Extender: [name], [place] [facility] in Treatment: 2 Debridement Performed for Assessment: Wound #1 Left,Posterior Calcaneus Performed By: Physician Maxwell Caul., MD The following information was scribed by: Karie Schwalbe The information was scribed for: [name] Debridement Type: Debridement Severity of Tissue Pre Debridement: Fat layer exposed Level of Consciousness (Pre-procedure): Awake and Alert Pre-procedure Verification/Time Out Yes - 08:30 Taken: Start Time: 08:30 Pain Control: Lidocaine 5% topical ointment Percent of Wound Bed Debrided: 100% T Area Debrided (cm): otal 1.79 Tissue and other material debrided: Viable, Non-Viable, Slough, Subcutaneous, Slough Level: Skin/Subcutaneous Tissue Debridement Description: Excisional Instrument: Curette Bleeding: Minimum Hemostasis Achieved: Pressure Response to Treatment: Procedure was tolerated well Level of Consciousness (Post- Awake and Alert procedure): Post Debridement Measurements of Total Wound Length: (cm) 1.9 Width: (cm) 1.2 Depth: (cm) 0.3 Volume: (cm) 0.537 Character of Wound/Ulcer Post Debridement: Improved Severity of Tissue Post Debridement: Fat layer exposed Post Procedure Diagnosis Same as Pre-procedure Electronic Signature(s) Signed: 03/15/2023 12:59:16 PM By: [name] MD Entered By: [name] on 03/14/2023 05:52:35 -------------------------------------------------------------------------------- Debridement Details Patient Name: Date of  Service: Billy Chessman LD, [name] 03/14/2023 8:15 Billy M Medical Record Number: [id number] Patient Account Number: [id number] Date of Birth/Sex: Treating RN: 11-Feb-1946 (77 y.o. M) Primary Care Provider: [name] Other Clinician: Referring Provider: Treating Provider/Extender: Kathaleen Grinder in Treatment: 2 Debridement Performed for Assessment: Wound #2 Right,Posterior Calcaneus Performed By: Physician Maxwell Caul., MD The following information was scribed by: Cristofer, Rathgeber ([id number]) [id number] Page 2 of 9 The information was scribed for: [name] Debridement Type: Debridement Severity of Tissue Pre Debridement: Fat layer exposed Level of Consciousness (Pre-procedure): Awake and Alert Pre-procedure Verification/Time Out Yes - 08:30 Taken: Start Time: 08:30 Pain Control: Lidocaine 5% topical ointment Percent of Wound Bed Debrided: 100% T Area Debrided (cm): otal 0.6 Tissue and other material debrided: Viable, Non-Viable, Slough, Subcutaneous, Slough Level: Skin/Subcutaneous Tissue Debridement Description: Excisional Instrument: Curette Bleeding: Minimum Hemostasis Achieved: Pressure Response to Treatment: Procedure was tolerated well Level of Consciousness (Post- Awake and Alert procedure): Post Debridement Measurements of Total Wound Length: (cm) 1.1 Width: (cm) 0.7 Depth: (cm) 0.2 Volume: (cm) 0.121 Character of Wound/Ulcer Post Debridement: Improved Severity of Tissue Post Debridement: Fat layer exposed Post Procedure Diagnosis Same as Pre-procedure Electronic Signature(s) Signed: 03/15/2023 12:59:16 PM By: [name] MD Entered By: [name] on 03/14/2023 05:52:44 -------------------------------------------------------------------------------- HPI Details Patient Name: Date of Service: Billy Chessman LD, [name] 03/14/2023 8:15 Billy M Medical Record Number:  [id number] Patient Account Number: [id number] Date of Birth/Sex: Treating RN: 1946/05/19 (77 y.o. M) Primary Care Provider: [name] Other Clinician: Referring Provider: Treating Provider/Extender: [name], [place] in Treatment: 2 History of Present Illness HPI Description: ADMISSION 02/26/2023 Patient is Billy 77 year old man with type 2 diabetes. He has had Billy 3-month history of wounds on his bilateral heels just above the heel tips. They have been using Iodoflex/Iodosorb ointment. The patient has Billy history of PAD and has Billy SFA stent on the right. He also also status post left iliac stent. Furthermore he has Billy history of carotid artery stenosis status post right carotid endarterectomy  Entered By: [name] on 03/14/2023 05:49:58 -------------------------------------------------------------------------------- Progress Note Details Patient Name: Date of Service: Billy [name], [name] 03/14/2023 8:15 Billy M Medical Record Number: [id number] Patient Account Number: [id number] Date of Birth/Sex: Treating RN: 04-19-1946 (77 y.o. M) Primary Care Provider: [name] Other Clinician: Referring Provider: Treating Provider/Extender: [name], [place] in Treatment: 2 Subjective History of Present Illness (HPI) ADMISSION 02/26/2023 Patient is Billy 77 year old man with type 2 diabetes. He has had Billy 27-month history of wounds on his bilateral heels just  above the heel tips. They have been using Iodoflex/Iodosorb ointment. The patient has Billy history of PAD and has Billy SFA stent on the right. He also also status post left iliac stent. Furthermore he has Billy history of carotid artery stenosis status post right carotid endarterectomy and Billy left subclavian artery stenosis. His wound started on his blisters on the heel that of open to wounds with minimal but some depth right greater than left. He also has advanced diabetic neuropathy. The patient is cared for through the [place] and has been referred here for hyperbaric oxygen treatment through his vascular surgeon. Past medical history includes hypertension, hyperlipidemia, type 2 diabetes chronic kidney disease stage IIIb, carotid artery stenosis status post right carotid endarterectomy, left subclavian artery stenosis ABIs done in our clinic were 1.11 on the right and 0.75 on the left these were remarkably similar to what was quoted during the vascular surgery appointment from 02/05/2023. 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 He was referred for hyperbaric oxygen by the [place]. His chest x-ray I think shows vascular crowding but no pneumothorax. He has been using Iodoflex on the wound with reasonably good improvement this week. He sees his ENT in 2 days and then they will make Billy decision about when they want to start HBO 10/24; patient is started HBO I believe he is on treatment 5 today. He had some problems with his right ear on the second dive however he has had no problems since and says he feels well. He slept through treatment yesterday. He has been using Iodosorb ointment on the wounds on both heels for Billy long period even before he came here. Unfortunately the wound surface still does not look completely as viable as I would like. Objective Constitutional Sitting or standing Blood Pressure is within target range for patient.. Pulse regular and  within target range for patient.Marland Kitchen Respirations regular, non-labored and within target range.. Temperature is normal and within the target range for the patient.Marland Kitchen Appears in no distress. DIAR, FELMLEE ([id number]) [id number] Page 7 of 9 Vitals Time Taken: 8:15 AM, Height: 70 in, Weight: 196 lbs, BMI: 28.1, Temperature: 98.2 F, Pulse: 64 bpm, Respiratory Rate: 18 breaths/min, Blood Pressure: 131/72 mmHg. General Notes: Wound exam; the area on the left still has some depth. I 100% covered in nonviable material I used Billy #3 curette to remove this hemostasis with direct pressure. The area on the right is not as deep but still 100% nonviable surface I used Billy #3 curette here as well. Both subcutaneous debridement removing some subcutaneous tissue and slough. There is no exposed bone in either area Integumentary (Hair, Skin) Wound #1 status is Open. Original cause of wound was Blister. The date acquired was: 01/20/2023. The wound has been in treatment 2 weeks. The wound is located on the Left,Posterior Calcaneus. The wound measures 1.9cm length x 1.2cm width x 0.3cm depth;  Entered By: [name] on 03/14/2023 05:49:58 -------------------------------------------------------------------------------- Progress Note Details Patient Name: Date of Service: Billy [name], [name] 03/14/2023 8:15 Billy M Medical Record Number: [id number] Patient Account Number: [id number] Date of Birth/Sex: Treating RN: 04-19-1946 (77 y.o. M) Primary Care Provider: [name] Other Clinician: Referring Provider: Treating Provider/Extender: [name], [place] in Treatment: 2 Subjective History of Present Illness (HPI) ADMISSION 02/26/2023 Patient is Billy 77 year old man with type 2 diabetes. He has had Billy 27-month history of wounds on his bilateral heels just  above the heel tips. They have been using Iodoflex/Iodosorb ointment. The patient has Billy history of PAD and has Billy SFA stent on the right. He also also status post left iliac stent. Furthermore he has Billy history of carotid artery stenosis status post right carotid endarterectomy and Billy left subclavian artery stenosis. His wound started on his blisters on the heel that of open to wounds with minimal but some depth right greater than left. He also has advanced diabetic neuropathy. The patient is cared for through the [place] and has been referred here for hyperbaric oxygen treatment through his vascular surgeon. Past medical history includes hypertension, hyperlipidemia, type 2 diabetes chronic kidney disease stage IIIb, carotid artery stenosis status post right carotid endarterectomy, left subclavian artery stenosis ABIs done in our clinic were 1.11 on the right and 0.75 on the left these were remarkably similar to what was quoted during the vascular surgery appointment from 02/05/2023. 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 He was referred for hyperbaric oxygen by the [place]. His chest x-ray I think shows vascular crowding but no pneumothorax. He has been using Iodoflex on the wound with reasonably good improvement this week. He sees his ENT in 2 days and then they will make Billy decision about when they want to start HBO 10/24; patient is started HBO I believe he is on treatment 5 today. He had some problems with his right ear on the second dive however he has had no problems since and says he feels well. He slept through treatment yesterday. He has been using Iodosorb ointment on the wounds on both heels for Billy long period even before he came here. Unfortunately the wound surface still does not look completely as viable as I would like. Objective Constitutional Sitting or standing Blood Pressure is within target range for patient.. Pulse regular and  within target range for patient.Marland Kitchen Respirations regular, non-labored and within target range.. Temperature is normal and within the target range for the patient.Marland Kitchen Appears in no distress. DIAR, FELMLEE ([id number]) [id number] Page 7 of 9 Vitals Time Taken: 8:15 AM, Height: 70 in, Weight: 196 lbs, BMI: 28.1, Temperature: 98.2 F, Pulse: 64 bpm, Respiratory Rate: 18 breaths/min, Blood Pressure: 131/72 mmHg. General Notes: Wound exam; the area on the left still has some depth. I 100% covered in nonviable material I used Billy #3 curette to remove this hemostasis with direct pressure. The area on the right is not as deep but still 100% nonviable surface I used Billy #3 curette here as well. Both subcutaneous debridement removing some subcutaneous tissue and slough. There is no exposed bone in either area Integumentary (Hair, Skin) Wound #1 status is Open. Original cause of wound was Blister. The date acquired was: 01/20/2023. The wound has been in treatment 2 weeks. The wound is located on the Left,Posterior Calcaneus. The wound measures 1.9cm length x 1.2cm width x 0.3cm depth;  Days Discharge Instructions: Secure with Kerlix as directed. Secured With: 68M Medipore H Soft Cloth Surgical T ape, 4 x 10 (in/yd) (Generic) Every Other Day/30 Days Discharge Instructions: Secure with tape as directed. Secured With: Tubular Netting #5 Every Other Day/30 Days Discharge Instructions: secure kerlix in place. GLYCEMIA INTERVENTIONS PROTOCOL PRE-HBO GLYCEMIA INTERVENTIONS ACTION INTERVENTION Obtain pre-HBO capillary blood glucose (ensure 1 physician order is in chart). Billy. Notify HBO physician and await physician orders. 2 If result is 70 mg/dl or below: B. If the result meets the hospital definition of Billy critical result, follow hospital policy. ISAI, PETRICCA ([id number]) [id number] Page 5 of 9 Billy. Give patient an 8 ounce Glucerna Shake, an 8 ounce Ensure, or 8 ounces of Billy Glucerna/Ensure equivalent dietary supplement*. B. Wait 30 minutes. If result is 71 mg/dl to 884 mg/dl: C. Retest patients capillary blood  glucose (CBG). D. If result greater than or equal to 110 mg/dl, proceed with HBO. If result less than 110 mg/dl, notify HBO physician and consider holding HBO. If result is 131 mg/dl to 166 mg/dl: Billy. Proceed with HBO. Billy. Notify HBO physician and await physician orders. B. It is recommended to hold HBO and do If result is 250 mg/dl or greater: blood/urine ketone testing. C. If the result meets the hospital definition of Billy critical result, follow hospital policy. POST-HBO GLYCEMIA INTERVENTIONS ACTION INTERVENTION Obtain post HBO capillary blood glucose (ensure 1 physician order is in chart). Billy. Notify HBO physician and await physician orders. 2 If result is 70 mg/dl or below: B. If the result meets the hospital definition of Billy critical result, follow hospital policy. Billy. Give patient an 8 ounce Glucerna Shake, an 8 ounce Ensure, or 8 ounces of Billy Glucerna/Ensure equivalent dietary supplement*. B. Wait 15 minutes for symptoms of If result is 71 mg/dl to 063 mg/dl: hypoglycemia (i.e. nervousness, anxiety, sweating, chills, clamminess, irritability, confusion, tachycardia or dizziness). C. If patient asymptomatic, discharge patient. If patient symptomatic, repeat capillary blood glucose (CBG) and notify HBO physician. If result is 101 mg/dl to 016 mg/dl: Billy. Discharge patient. Billy. Notify HBO physician and await physician orders. B. It is recommended to do blood/urine ketone If result is 250 mg/dl or greater: testing. C. If the result meets the hospital definition of Billy critical result, follow hospital policy. *Juice or candies are NOT equivalent products. If patient refuses the Glucerna or Ensure, please consult the hospital dietitian for an appropriate substitute. Electronic Signature(s) Signed: 03/14/2023 10:24:39 AM By: Karie Schwalbe RN Signed: 03/15/2023 12:59:16 PM By: [name] MD Entered By: Karie Schwalbe on 03/14/2023  05:59:09 -------------------------------------------------------------------------------- Problem List Details Patient Name: Date of Service: Billy Chessman LD, [name] 03/14/2023 8:15 Billy M Medical Record Number: [id number] Patient Account Number: [id number] Date of Birth/Sex: Treating RN: 1946/02/14 (77 y.o. 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 His wound started on his blisters on the heel that of open to wounds with minimal but some depth right greater than left. He also has advanced diabetic neuropathy. The patient is cared for through the [place] and has been referred here for hyperbaric oxygen treatment through his vascular surgeon. Past medical history includes hypertension, hyperlipidemia, type 2 diabetes chronic kidney disease stage IIIb, carotid artery stenosis status post right carotid endarterectomy, left subclavian artery stenosis ABIs done in our clinic were 1.11 on the right and 0.75 on the left these were remarkably similar to what was quoted during the vascular surgery appointment from 02/05/2023. 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 He was referred for hyperbaric oxygen by the [place]. His chest x-ray I think shows vascular crowding but no pneumothorax. He has been using Iodoflex on the wound with reasonably good improvement this week. He sees his ENT in 2 days and then they will make Billy decision about when they want to start HBO 10/24; patient is started HBO I believe he is on treatment 5 today. He had some problems  with his right ear on the second dive however he has had no problems since and says he feels well. He slept through treatment yesterday. He has been using Iodosorb ointment on the wounds on both heels for Billy long period even before he came here. Unfortunately the wound surface still does not look completely as viable as I would like. DUDE, TONA ([id number]) [id number] Page 3 of 9 Electronic Signature(s) Signed: 03/15/2023 12:59:16 PM By: [name] MD Entered By: [name] on 03/14/2023 05:55:17 -------------------------------------------------------------------------------- Physical Exam Details Patient Name: Date of Service: Billy Chessman LD, [name] 03/14/2023 8:15 Billy M Medical Record Number: [id number] Patient Account Number: [id number] Date of Birth/Sex: Treating RN: 01-31-1946 (77 y.o. M) Primary Care Provider: [name] Other Clinician: Referring Provider: Treating Provider/Extender: [name] [place] in Treatment: 2 Constitutional Sitting or standing Blood Pressure is within target range for patient.. Pulse regular and within target range for patient.Marland Kitchen Respirations regular, non-labored and within target range.. Temperature is normal and within the target range for the patient.Marland Kitchen Appears in no distress. Notes Wound exam; the area on the left still has some depth. I 100% covered in nonviable material I used Billy #3 curette to remove this hemostasis with direct pressure. The area on the right is not as deep but still 100% nonviable surface I used Billy #3 curette here as well. Both subcutaneous debridement removing some subcutaneous tissue and slough. There is no exposed bone in either area Electronic Signature(s) Signed: 03/15/2023 12:59:16 PM By: [name] MD Entered By: [name] on 03/14/2023 05:56:17 -------------------------------------------------------------------------------- Physician Orders  Details Patient Name: Date of Service: Billy Chessman LD, [name] 03/14/2023 8:15 Billy M Medical Record Number: [id number] Patient Account Number: [id number] Date of Birth/Sex: Treating RN: 08-Mar-1946 (77 y.o. [name] Primary Care Provider: [name] Other Clinician: Referring Provider: Treating Provider/Extender: [name] [place] in Treatment: 2 Verbal / Phone Orders: No Diagnosis Coding Follow-up Appointments ppointment in 1 week. - Dr. Leanord Hawking Thursday 03/19/2023 12:45pm (before hyperbarics) Return Billy ppointment in 2 weeks. - Dr. Leanord Hawking (front office to schedule appts) before or after Hyperbarics. Return Billy Return appointment in 3 weeks. - Dr. Leanord Hawking (front office to schedule appts) before or after Hyperbarics. Return appointment in 1 month. - Dr. Leanord Hawking (front office  Entered By: [name] on 03/14/2023 05:49:58 -------------------------------------------------------------------------------- Progress Note Details Patient Name: Date of Service: Billy [name], [name] 03/14/2023 8:15 Billy M Medical Record Number: [id number] Patient Account Number: [id number] Date of Birth/Sex: Treating RN: 04-19-1946 (77 y.o. M) Primary Care Provider: [name] Other Clinician: Referring Provider: Treating Provider/Extender: [name] [place] in Treatment: 2 Subjective History of Present Illness (HPI) ADMISSION 02/26/2023 Patient is Billy 77 year old man with type 2 diabetes. He has had Billy 27-month history of wounds on his bilateral heels just  above the heel tips. They have been using Iodoflex/Iodosorb ointment. The patient has Billy history of PAD and has Billy SFA stent on the right. He also also status post left iliac stent. Furthermore he has Billy history of carotid artery stenosis status post right carotid endarterectomy and Billy left subclavian artery stenosis. His wound started on his blisters on the heel that of open to wounds with minimal but some depth right greater than left. He also has advanced diabetic neuropathy. The patient is cared for through the [place] and has been referred here for hyperbaric oxygen treatment through his vascular surgeon. Past medical history includes hypertension, hyperlipidemia, type 2 diabetes chronic kidney disease stage IIIb, carotid artery stenosis status post right carotid endarterectomy, left subclavian artery stenosis ABIs done in our clinic were 1.11 on the right and 0.75 on the left these were remarkably similar to what was quoted during the vascular surgery appointment from 02/05/2023. 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 He was referred for hyperbaric oxygen by the [place]. His chest x-ray I think shows vascular crowding but no pneumothorax. He has been using Iodoflex on the wound with reasonably good improvement this week. He sees his ENT in 2 days and then they will make Billy decision about when they want to start HBO 10/24; patient is started HBO I believe he is on treatment 5 today. He had some problems with his right ear on the second dive however he has had no problems since and says he feels well. He slept through treatment yesterday. He has been using Iodosorb ointment on the wounds on both heels for Billy long period even before he came here. Unfortunately the wound surface still does not look completely as viable as I would like. Objective Constitutional Sitting or standing Blood Pressure is within target range for patient.. Pulse regular and  within target range for patient.Marland Kitchen Respirations regular, non-labored and within target range.. Temperature is normal and within the target range for the patient.Marland Kitchen Appears in no distress. DIAR, FELMLEE ([id number]) [id number] Page 7 of 9 Vitals Time Taken: 8:15 AM, Height: 70 in, Weight: 196 lbs, BMI: 28.1, Temperature: 98.2 F, Pulse: 64 bpm, Respiratory Rate: 18 breaths/min, Blood Pressure: 131/72 mmHg. General Notes: Wound exam; the area on the left still has some depth. I 100% covered in nonviable material I used Billy #3 curette to remove this hemostasis with direct pressure. The area on the right is not as deep but still 100% nonviable surface I used Billy #3 curette here as well. Both subcutaneous debridement removing some subcutaneous tissue and slough. There is no exposed bone in either area Integumentary (Hair, Skin) Wound #1 status is Open. Original cause of wound was Blister. The date acquired was: 01/20/2023. The wound has been in treatment 2 weeks. The wound is located on the Left,Posterior Calcaneus. The wound measures 1.9cm length x 1.2cm width x 0.3cm depth;  Entered By: [name] on 03/14/2023 05:49:58 -------------------------------------------------------------------------------- Progress Note Details Patient Name: Date of Service: Billy [name], [name] 03/14/2023 8:15 Billy M Medical Record Number: [id number] Patient Account Number: [id number] Date of Birth/Sex: Treating RN: 04-19-1946 (77 y.o. M) Primary Care Provider: [name] Other Clinician: Referring Provider: Treating Provider/Extender: [name] [facility] [place] [facility] in Treatment: 2 Subjective History of Present Illness (HPI) ADMISSION 02/26/2023 Patient is Billy 77 year old man with type 2 diabetes. He has had Billy 27-month history of wounds on his bilateral heels just  above the heel tips. They have been using Iodoflex/Iodosorb ointment. The patient has Billy history of PAD and has Billy SFA stent on the right. He also also status post left iliac stent. Furthermore he has Billy history of carotid artery stenosis status post right carotid endarterectomy and Billy left subclavian artery stenosis. His wound started on his blisters on the heel that of open to wounds with minimal but some depth right greater than left. He also has advanced diabetic neuropathy. The patient is cared for through the [place] and has been referred here for hyperbaric oxygen treatment through his vascular surgeon. Past medical history includes hypertension, hyperlipidemia, type 2 diabetes chronic kidney disease stage IIIb, carotid artery stenosis status post right carotid endarterectomy, left subclavian artery stenosis ABIs done in our clinic were 1.11 on the right and 0.75 on the left these were remarkably similar to what was quoted during the vascular surgery appointment from 02/05/2023. 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 He was referred for hyperbaric oxygen by the [place]. His chest x-ray I think shows vascular crowding but no pneumothorax. He has been using Iodoflex on the wound with reasonably good improvement this week. He sees his ENT in 2 days and then they will make Billy decision about when they want to start HBO 10/24; patient is started HBO I believe he is on treatment 5 today. He had some problems with his right ear on the second dive however he has had no problems since and says he feels well. He slept through treatment yesterday. He has been using Iodosorb ointment on the wounds on both heels for Billy long period even before he came here. Unfortunately the wound surface still does not look completely as viable as I would like. Objective Constitutional Sitting or standing Blood Pressure is within target range for patient.. Pulse regular and  within target range for patient.Marland Kitchen Respirations regular, non-labored and within target range.. Temperature is normal and within the target range for the patient.Marland Kitchen Appears in no distress. DIAR, FELMLEE ([id number]) [id number] Page 7 of 9 Vitals Time Taken: 8:15 AM, Height: 70 in, Weight: 196 lbs, BMI: 28.1, Temperature: 98.2 F, Pulse: 64 bpm, Respiratory Rate: 18 breaths/min, Blood Pressure: 131/72 mmHg. General Notes: Wound exam; the area on the left still has some depth. I 100% covered in nonviable material I used Billy #3 curette to remove this hemostasis with direct pressure. The area on the right is not as deep but still 100% nonviable surface I used Billy #3 curette here as well. Both subcutaneous debridement removing some subcutaneous tissue and slough. There is no exposed bone in either area Integumentary (Hair, Skin) Wound #1 status is Open. Original cause of wound was Blister. The date acquired was: 01/20/2023. The wound has been in treatment 2 weeks. The wound is located on the Left,Posterior Calcaneus. The wound measures 1.9cm length x 1.2cm width x 0.3cm depth;  Secondary Dressing: ALLEVYN Heel 4 1/2in x 5 1/2in / 10.5cm x 13.5cm (Generic) Every Other Day/30 Days Discharge Instructions: Apply over primary dressing as directed. Secured With: [employer], 4.5x3.1 (in/yd) (Generic) Every Other Day/30 Days Discharge Instructions: Secure with Kerlix as directed. Secured With: 46M Medipore H Soft Cloth Surgical T ape, 4 x 10 (in/yd) (Generic) Every Other Day/30 Days Discharge Instructions: Secure with tape as directed. Secured With: Tubular Netting #5 Every Other Day/30 Days Discharge  Instructions: secure kerlix in place. 1. I have discontinued the Iodosorb/Iodoflex 2. I am going to use Sorbact hydrogel. Again the goal here is to have Billy clean viable surface not requiring debridement 3. We will use heel cups and gauze. He has heel offloading boots. He has bunny boots for use at [employer]) Signed: 03/15/2023 2:17:02 PM By: Shawn Stall RN, BSN Signed: 03/19/2023 8:53:04 AM By: [name] MD Previous Signature: 03/15/2023 12:59:16 PM Version By: [name] MD Entered By: Shawn Stall on 03/15/2023 09:59:13 -------------------------------------------------------------------------------- SuperBill Details Patient Name: Date of Service: Billy [name], [name] 03/14/2023 Medical Record Number: [id number] Patient Account Number: [id number] Date of Birth/Sex: Treating RN: Jan 04, 1946 (77 y.o. 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 E11.42 Type 2 diabetes mellitus with diabetic polyneuropathy E11.51 Type 2 diabetes mellitus with diabetic peripheral angiopathy without gangrene Facility Procedures : CPT4 Code: 78295621 Description: 11042 - DEB SUBQ TISSUE 20 SQ CM/< ICD-10 Diagnosis Description 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 with other specified sev 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 with other specified seve Modifier: erity rity Quantity: 1 Physician Procedures : CPT4 Code Description Modifier [id number] 11042 - WC  PHYS SUBQ TISS 20 SQ CM ICD-10 Diagnosis Description 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

## 2023-03-19 NOTE — Progress Notes (Addendum)
LINDA, BIEHN ([id number]) [id number] Page 1 of 1 Visit Report for 03/18/2023 SuperBill Details Patient Name: Date of Service: [name], [name] 03/18/2023 Medical Record Number: [id number] Patient Account Number: [id number] Date of Birth/Sex: Treating RN: 01-13-1946 (77 y.o. 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 E11.42 Type 2 diabetes mellitus with diabetic polyneuropathy E11.51 Type 2 diabetes mellitus with diabetic peripheral angiopathy without gangrene Facility Procedures CPT4 Code Description Modifier Quantity 78295621 G0277-(Facility Use Only) HBOT full body chamber, , 4 ICD-10 Diagnosis Description 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 E11.42 Type 2 diabetes mellitus with diabetic polyneuropathy Physician Procedures Quantity CPT4 Code Description Modifier [id number] 99183 - WC PHYS HYPERBARIC OXYGEN THERAPY 1 ICD-10 Diagnosis Description 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

## 2023-03-20 ENCOUNTER — Encounter (HOSPITAL_BASED_OUTPATIENT_CLINIC_OR_DEPARTMENT_OTHER): Payer: No Typology Code available for payment source | Admitting: General Surgery

## 2023-03-21 ENCOUNTER — Encounter (HOSPITAL_BASED_OUTPATIENT_CLINIC_OR_DEPARTMENT_OTHER): Payer: No Typology Code available for payment source | Admitting: General Surgery

## 2023-03-22 ENCOUNTER — Encounter (HOSPITAL_BASED_OUTPATIENT_CLINIC_OR_DEPARTMENT_OTHER): Payer: No Typology Code available for payment source | Admitting: General Surgery

## 2023-03-25 ENCOUNTER — Encounter (HOSPITAL_BASED_OUTPATIENT_CLINIC_OR_DEPARTMENT_OTHER): Payer: No Typology Code available for payment source | Attending: General Surgery | Admitting: General Surgery

## 2023-03-25 DIAGNOSIS — N1832 Chronic kidney disease, stage 3b: Secondary | ICD-10-CM | POA: Diagnosis not present

## 2023-03-25 DIAGNOSIS — L97418 Non-pressure chronic ulcer of right heel and midfoot with other specified severity: Secondary | ICD-10-CM | POA: Insufficient documentation

## 2023-03-25 DIAGNOSIS — L97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: Secondary | ICD-10-CM | POA: Diagnosis not present

## 2023-03-25 DIAGNOSIS — E1122 Type 2 diabetes mellitus with diabetic chronic kidney disease: Secondary | ICD-10-CM | POA: Diagnosis not present

## 2023-03-25 DIAGNOSIS — E11621 Type 2 diabetes mellitus with foot ulcer: Secondary | ICD-10-CM | POA: Insufficient documentation

## 2023-03-25 DIAGNOSIS — E1142 Type 2 diabetes mellitus with diabetic polyneuropathy: Secondary | ICD-10-CM | POA: Insufficient documentation

## 2023-03-25 DIAGNOSIS — E1151 Type 2 diabetes mellitus with diabetic peripheral angiopathy without gangrene: Secondary | ICD-10-CM | POA: Diagnosis not present

## 2023-03-25 LAB — GLUCOSE, CAPILLARY
Glucose-Capillary: 203 mg/dL — ABNORMAL HIGH (ref 70–99)
Glucose-Capillary: 157 mg/dL — ABNORMAL HIGH (ref 70–99)

## 2023-03-25 NOTE — Progress Notes (Signed)
[name], [name] ([id number]) [id number] Page 1 of 2 Visit Report for 03/25/2023 Arrival Information Details Patient Name: Date of Service: [name], [name] 03/25/2023 10:00 A M Medical Record Number: [id number] Patient Account Number: [id number] Date of Birth/Sex: Treating RN: Jun 23, 1945 (77 y.o. [name] Primary Care Janani Chamber: [name] Other Clinician: [name] Referring Christen Wardrop: Treating Tyion Boylen/Extender: [name] [place] [facility] in Treatment: 3 Visit Information History Since Last Visit All ordered tests and consults were completed: Yes Patient Arrived: [name] Added or deleted any medications: No Arrival Time: 07:54 Any new allergies or adverse reactions: No Accompanied By: spouse Had a fall or experienced change in No Transfer Assistance: None activities of daily living that may affect Patient Identification Verified: Yes risk of falls: Secondary Verification Process Completed: Yes Signs or symptoms of abuse/neglect since last visito No Patient Requires Transmission-Based Precautions: No Hospitalized since last visit: No Patient Has Alerts: No Implantable device outside of the clinic excluding No cellular tissue based products placed in the center since last visit: Pain Present Now: No Electronic Signature(s) Signed: 03/25/2023 2:37:07 PM By: [name] CHT EMT BS , , Entered By: [name] on 03/25/2023 11:37:07 -------------------------------------------------------------------------------- Encounter Discharge Information Details Patient Name: Date of Service: [name], [name] 03/25/2023 10:00 A M Medical Record Number: [id number] Patient Account Number: [id number] Date of Birth/Sex: Treating RN: 03-18-1946 (77 y.o. [name] Primary Care Signa Cheek: [facility] [place] Other Clinician: [name] Referring Jaquia Benedicto: Treating Stormi Vandevelde/Extender: [name] [place] [facility] in Treatment: 3 Encounter Discharge Information Items Discharge Condition: Stable Ambulatory Status: Walker Discharge Destination: Home Transportation: Private Auto Accompanied By: spouse Schedule Follow-up Appointment: No Clinical Summary of Care: Electronic Signature(s) Signed: 03/25/2023 2:46:28 PM By: [name] CHT EMT BS , , Entered By: [name] on 03/25/2023 11:46:28 [name] ([id number]) [id number] Page 2 of 2 -------------------------------------------------------------------------------- Vitals Details Patient Name: Date of Service: [name], [name] 03/25/2023 10:00 A M Medical Record Number: [id number] Patient Account Number: [id number] Date of Birth/Sex: Treating RN: 01-08-1946 (77 y.o. [name] Primary Care Fujiko Picazo: [name] Other Clinician: [name] Referring Lailah Marcelli: Treating Meighan Treto/Extender: [name] [place] [facility] in Treatment: 3 Vital Signs Time Taken: 09:29 Temperature (F): 97.3 Height (in): 70 Pulse (bpm): 61 Weight (lbs): 196 Respiratory Rate (breaths/min): 12 Body Mass Index (BMI): 28.1 Blood Pressure (mmHg): 130/52 Capillary Blood Glucose (mg/dl): 601 Reference Range: 80 - 120 mg / dl Electronic Signature(s) Signed: 03/25/2023 2:37:31 PM By: [name] CHT EMT BS , , Entered By: [name] on 03/25/2023 11:37:31

## 2023-03-25 NOTE — Progress Notes (Signed)
[name], STEEDMAN ([id number]) [id number] Page 1 of 1 Visit Report for 03/25/2023 SuperBill Details Patient Name: Date of Service: [name], [name] 03/25/2023 Medical Record Number: [id number] Patient Account Number: [id number] Date of Birth/Sex: Treating RN: 1946-04-18 (77 y.o. 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 E11.42 Type 2 diabetes mellitus with diabetic polyneuropathy E11.51 Type 2 diabetes mellitus with diabetic peripheral angiopathy without gangrene Facility Procedures CPT4 Code Description Modifier Quantity [id number] G0277-(Facility Use Only) HBOT full body chamber, , 4 ICD-10 Diagnosis Description 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 E11.42 Type 2 diabetes mellitus with diabetic polyneuropathy Physician Procedures Quantity CPT4 Code Description Modifier 1027253 99183 - WC PHYS HYPERBARIC OXYGEN THERAPY 1 ICD-10 Diagnosis Description 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

## 2023-03-25 NOTE — Progress Notes (Addendum)
KENNETT, SYMES ([id number]) [id number] Page 1 of 3 Visit Report for 03/25/2023 HBO Details Patient Name: Date of Service: [name], [name] 03/25/2023 10:00 A M Medical Record Number: [id number] Patient Account Number: [id number] Date of Birth/Sex: Treating RN: 06-Feb-1946 (77 y.o. [name] Primary Care Chirstine Defrain: [facility] [place] Other Clinician: [name] Referring Masaji Billups: Treating Niv Darley/Extender: [name] [place] [facility] in Treatment: 3 HBO Treatment Course Details Treatment Course Number: 1 Ordering Yaniv Lage: [name] T Treatments Ordered: otal 40 HBO Treatment Start Date: 03/11/2023 HBO Indication: Diabetic Ulcer(s) of the Lower Extremity Standard/Conservative Wound Care tried and failed greater than or equal to 30 days Wound #1 Left, Posterior Calcaneus , Wound #2 Right, Posterior Calcaneus HBO Treatment Details Treatment Number: 7 Patient Type: Outpatient Chamber Type: Monoplace Chamber Serial #: [id number] Treatment Protocol: 2.0 ATA with 90 minutes oxygen, and no air breaks Treatment Details Compression Rate Down: 1.0 psi / minute De-Compression Rate Up: 1.5 psi / minute Air breaks and breathing Decompress Decompress Compress Tx Pressure Begins Reached periods Begins Ends (leave unused spaces blank) Chamber Pressure (ATA 1 2 ------2 1 ) Clock Time (24 hr) 10:08 10:28 - - - - - - 11:58 12:09 Treatment Length: 121 (minutes) Treatment Segments: 4 Vital Signs Capillary Blood Glucose Reference Range: 80 - 120 mg / dl HBO Diabetic Blood Glucose Intervention Range: <131 mg/dl or >782 mg/dl Type: Time Vitals Blood Respiratory Capillary Blood Glucose Pulse Action Pulse: Temperature: Taken: Pressure: Rate: Glucose (mg/dl): Meter #: Oximetry (%) Taken: Pre 09:29 130/52 61 12 97.3 203 none per protocol Post 12:17 150/93 63 12 97.9 157 none per protocol Treatment Response Treatment  Toleration: Well Treatment Completion Status: Treatment Completed without Adverse Event Treatment Notes Mr. Fettig arrived with normal vital signs. He was returning from a few days of upper respiratory problems (head cold per patient). He prepared for treatment which included self- administering Afrin today as a precaution. Dr. Leanord Hawking did speak to patient about being able to equalize his ears. After performing a safety check, patient was placed in the chamber which was compressed with 100% oxygen at a rate of 1 psi/min and chamber ventilation rate of 450 L/min effectively slowing travel rate to 0.74 psi/min. He tolerated the travel until reaching approximately 10 psig chamber pressure, at which point chamber ventilation (purge flow rate) rate was decreased to 275 L/min bringing travel rate closer to 1 psi/min. He tolerated the treatment and subsequent decompression of the chamber at a rate of 1 psi/min. He denied any issues with ear equalization and/or pain from barotrauma. Post-treatment vital signs were within normal range. He was stable upon discharge with his wife. Additional Procedure Documentation Tissue [name] Catalia Massett Notes Mr. Faivre missed 2 dives last week because of an upper respite respiratory tract infection. He was seen in urgent care this weekend and told he had an "cold". His tympanic membranes look fine and he appeared to be stable today. T olerated the treatment well SMITTY, ACKERLEY ([id number]) [id number] Page 2 of 3 Electronic Signature(s) Signed: 03/25/2023 6:08:09 PM By: [name] CHT EMT BS , , Signed: 03/26/2023 4:32:14 PM By: [name] MD Previous Signature: 03/25/2023 4:50:34 PM Version By: [name] MD Previous Signature: 03/25/2023 2:42:21 PM Version By: [name] CHT EMT BS , , Entered By: [name] on 03/25/2023  18:08:08 -------------------------------------------------------------------------------- HBO Safety Checklist Details Patient Name: Date of Service: [name], [name] 03/25/2023 10:00 A M Medical Record Number: [id number] Patient  Account Number: [id number] Date of Birth/Sex: Treating RN: [birth year] (77 y.o. [name] Primary Care Akiera Allbaugh: [name] Other Clinician: [name] Referring Somaya Grassi: Treating Eyad Rochford/Extender: [name] [place] [facility] in Treatment: 3 HBO Safety Checklist Items Safety Checklist Consent Form Signed Patient voided / foley secured and emptied When did you last eato 0805 Last dose of injectable or oral agent 0715 - metformin Ostomy pouch emptied and vented if applicable NA All implantable devices assessed, documented and approved NA Intravenous access site secured and place NA Valuables secured Linens and cotton and cotton/polyester blend (less than 51% polyester) Personal oil-based products / skin lotions / body lotions removed Wigs or hairpieces removed NA Smoking or tobacco materials removed NA Books / newspapers / magazines / loose paper removed Cologne, aftershave, perfume and deodorant removed Jewelry removed (may wrap wedding band) Make-up removed NA Hair care products removed Battery operated devices (external) removed Heating patches and chemical warmers removed Titanium eyewear removed Nail polish cured greater than 10 hours NA Casting material cured greater than 10 hours NA Hearing aids removed NA Loose dentures or partials removed dentures removed Prosthetics have been removed NA Patient demonstrates correct use of air break device (if applicable) Patient concerns have been addressed Patient grounding bracelet on and cord attached to chamber Specifics for Inpatients (complete in addition to above) Medication sheet sent with patient NA Intravenous medications needed or due during  therapy sent with patient NA Drainage tubes (e.g. nasogastric tube or chest tube secured and vented) NA Endotracheal or Tracheotomy tube secured NA Cuff deflated of air and inflated with saline NA Airway suctioned NA Notes Paper version used prior to treatment start. Electronic Signature(s) Signed: 03/25/2023 2:40:33 PM By: [name] CHT EMT BS , , Entered By: [name] on 03/25/2023 14:40:33 [place], Les Pou ([id number]) [id number] Page 3 of 3

## 2023-03-26 ENCOUNTER — Encounter (HOSPITAL_BASED_OUTPATIENT_CLINIC_OR_DEPARTMENT_OTHER): Payer: No Typology Code available for payment source | Admitting: Internal Medicine

## 2023-03-26 DIAGNOSIS — E11621 Type 2 diabetes mellitus with foot ulcer: Secondary | ICD-10-CM | POA: Diagnosis not present

## 2023-03-26 LAB — GLUCOSE, CAPILLARY
Glucose-Capillary: 209 mg/dL — ABNORMAL HIGH (ref 70–99)
Glucose-Capillary: 157 mg/dL — ABNORMAL HIGH (ref 70–99)

## 2023-03-26 NOTE — Progress Notes (Addendum)
Billy, [name] ([id number]) [id number] Page 1 of 2 Visit Report for 03/26/2023 Arrival Information Details Patient Name: Date of Service: [name], [name] 03/26/2023 10:00 A M Medical Record Number: [id number] Patient Account Number: [id number] Date of Birth/Sex: Treating RN: [birth year] (77 y.o. [name] Primary Care Esten Dollar: [name] Other Clinician: [name] Referring Reshaun Briseno: Treating Aven Cegielski/Extender: [name] [place] in Treatment: 4 Visit Information History Since Last Visit All ordered tests and consults were completed: Yes Patient Arrived: [name] Added or deleted any medications: No Arrival Time: 09:06 Any new allergies or adverse reactions: No Accompanied By: spouse Had a fall or experienced change in No Transfer Assistance: None activities of daily living that may affect Patient Identification Verified: Yes risk of falls: Secondary Verification Process Completed: Yes Signs or symptoms of abuse/neglect since last visito No Patient Requires Transmission-Based Precautions: No Hospitalized since last visit: No Patient Has Alerts: No Implantable device outside of the clinic excluding No cellular tissue based products placed in the center since last visit: Pain Present Now: No Electronic Signature(s) Signed: 03/26/2023 9:30:54 AM By: [name] CHT EMT BS , , Entered By: [name] on 03/26/2023 06:30:54 -------------------------------------------------------------------------------- Encounter Discharge Information Details Patient Name: Date of Service: [name], [name] 03/26/2023 10:00 A M Medical Record Number: [id number] Patient Account Number: [id number] Date of Birth/Sex: Treating RN: [birth year] (77 y.o. [name] Primary Care Eliza Green: [name] Other Clinician: [name] Referring Jermey Closs: Treating Breydan Shillingburg/Extender: [name] [place] in Treatment: 4 Encounter Discharge Information Items Discharge Condition: Stable Ambulatory Status: Ambulatory Discharge Destination: Home Transportation: Private Auto Accompanied By: self Schedule Follow-up Appointment: No Clinical Summary of Care: Electronic Signature(s) Signed: 03/26/2023 2:03:24 PM By: [name] CHT EMT BS , , Entered By: [name] on 03/26/2023 11:03:24 [name] ([id number]) [id number] Page 2 of 2 -------------------------------------------------------------------------------- Vitals Details Patient Name: Date of Service: [name], [name] 03/26/2023 10:00 A M Medical Record Number: [id number] Patient Account Number: [id number] Date of Birth/Sex: Treating RN: [name], [name] (77 y.o. [name] Primary Care Jamelyn Bovard: [name] Other Clinician: [name] Referring Sadhana Frater: Treating Celia Friedland/Extender: [name] [place] in Treatment: 4 Vital Signs Time Taken: 09:[name] Temperature (F): 98.1 Height (in): 70 Pulse (bpm): 64 Weight (lbs): 196 Respiratory Rate (breaths/min): [name] Body Mass Index (BMI): 28.1 Blood Pressure (mmHg): 131/52 Capillary Blood Glucose (mg/dl): 431 Reference Range: 80 - 120 mg / dl Electronic Signature(s) Signed: 03/26/2023 9:31:[name] AM By: [name] CHT EMT BS , , Entered By: [name] on 03/26/2023 06:31:17

## 2023-03-26 NOTE — Progress Notes (Addendum)
DEVEYON, NAZARI ([id number]) [id number] Page 1 of 3 Visit Report for 03/26/2023 HBO Details Patient Name: Date of Service: [name], [name] 03/26/2023 10:00 A M Medical Record Number: [id number] Patient Account Number: [id number] Date of Birth/Sex: Treating RN: [birth year] (77 y.o. [name] Primary Care Ayomide Zuleta: [name] Other Clinician: [name] Referring Dashonda Bonneau: Treating Laylana Gerwig/Extender: [name] [place] in Treatment: 4 HBO Treatment Course Details Treatment Course Number: 1 Ordering Eriyanna Kofoed: [name] T Treatments Ordered: otal 40 HBO Treatment Start Date: 03/11/2023 HBO Indication: Diabetic Ulcer(s) of the Lower Extremity Standard/Conservative Wound Care tried and failed greater than or equal to 30 days Wound #1 Left, Posterior Calcaneus , Wound #2 Right, Posterior Calcaneus HBO Treatment Details Treatment Number: 8 Patient Type: Outpatient Chamber Type: Monoplace Chamber Serial #: [id number] Treatment Protocol: 2.0 ATA with 90 minutes oxygen, and no air breaks Treatment Details Compression Rate Down: 1.0 psi / minute De-Compression Rate Up: 1.5 psi / minute Air breaks and breathing Decompress Decompress Compress Tx Pressure Begins Reached periods Begins Ends (leave unused spaces blank) Chamber Pressure (ATA 1 2 ------2 1 ) Clock Time (24 hr) 10:00 10:18 - - - - - - 11:48 11:59 Treatment Length: 119 (minutes) Treatment Segments: 4 Vital Signs Capillary Blood Glucose Reference Range: 80 - 120 mg / dl HBO Diabetic Blood Glucose Intervention Range: <131 mg/dl or >782 mg/dl Type: Time Vitals Blood Pulse: Respiratory Temperature: Capillary Blood Glucose Pulse Action Taken: Pressure: Rate: Glucose (mg/dl): Meter #: Oximetry (%) Taken: Pre 09:18 131/52 64 18 98.1 209 asymptomatic for hypotension Post 12:04 149/67 63 18 97.2 157 none per protocol Treatment Response Treatment  Toleration: Well Treatment Completion Status: Treatment Completed without Adverse Event Treatment Notes Mr. Pasquariello arrived with normal vital signs except diastolic BP at 52 mmHg. Patient self-administered Afrin. Patient denied symptoms related to hypotension, stating that he felt good. After performing a safety check he was placed in the chamber, which was compressed with 100% oxygen at a rate of 1 psi/min until reaching 10 psig at which time he confirmed normal ear equalization and travel rate was increased to 1.5 psi/min. He tolerated the treatment and subsequent decompression of the chamber at the rate of 1.5 psi/min. Post-treatment vital signs were within normal range. He was stable upon discharge. Additional Procedure Documentation Tissue [name] Khylie Larmore Notes No concerns with treatment given Physician HBO Attestation: I certify that I supervised this HBO treatment in accordance with Medicare guidelines. A trained emergency response team is readily available per Yes COBEY, CABALLEROS ([id number]) [id number] Page 2 of 3 hospital policies and procedures. Continue HBOT as ordered. Yes Electronic Signature(s) Signed: 03/29/2023 1:13:07 PM By: [name] CHT EMT BS , , Signed: 04/01/2023 4:37:17 PM By: [name] MD Previous Signature: 03/26/2023 4:32:14 PM Version By: [name] MD Previous Signature: 03/26/2023 2:02:29 PM Version By: [name] CHT EMT BS , , Previous Signature: 03/26/2023 11:32:53 AM Version By: [name] CHT EMT BS , , Entered By: [name] on 03/29/2023 10:13:07 -------------------------------------------------------------------------------- HBO Safety Checklist Details Patient Name: Date of Service: [name], [name] 03/26/2023 10:00 A M Medical Record Number: [id number] Patient Account Number: [id number] Date of Birth/Sex: Treating RN: 03/17/1946 (77 y.o. [name] Primary  Care Shatasia Cutshaw: [name] Other Clinician: [name] Referring Sibyl Mikula: Treating Linsey Hirota/Extender: [name] [place] in Treatment: 4 HBO Safety Checklist Items Safety Checklist Consent Form Signed Patient voided / foley secured and emptied When did  you last eato 0730 Last dose of injectable or oral agent Metformin 0715 Ostomy pouch emptied and vented if applicable NA All implantable devices assessed, documented and approved NA Intravenous access site secured and place NA Valuables secured Linens and cotton and cotton/polyester blend (less than 51% polyester) Personal oil-based products / skin lotions / body lotions removed Wigs or hairpieces removed NA Smoking or tobacco materials removed NA Books / newspapers / magazines / loose paper removed Cologne, aftershave, perfume and deodorant removed Jewelry removed (may wrap wedding band) Make-up removed NA Hair care products removed Battery operated devices (external) removed Heating patches and chemical warmers removed Titanium eyewear removed Nail polish cured greater than 10 hours NA Casting material cured greater than 10 hours NA Hearing aids removed NA Loose dentures or partials removed Prosthetics have been removed NA Patient demonstrates correct use of air break device (if applicable) Patient concerns have been addressed Patient grounding bracelet on and cord attached to chamber Specifics for Inpatients (complete in addition to above) Medication sheet sent with patient NA Intravenous medications needed or due during therapy sent with patient NA Drainage tubes (e.g. nasogastric tube or chest tube secured and vented) NA Endotracheal or Tracheotomy tube secured NA Cuff deflated of air and inflated with saline NA Airway suctioned NA Notes Paper version used prior to treatment start. Electronic Signature(s) STEIN, BURGOS ([id number])  [id number] Page 3 of 3 Signed: 03/26/2023 11:29:01 AM By: [name] CHT EMT BS , , Entered By: [name] on 03/26/2023 08:29:01

## 2023-03-27 ENCOUNTER — Encounter (HOSPITAL_BASED_OUTPATIENT_CLINIC_OR_DEPARTMENT_OTHER): Payer: No Typology Code available for payment source | Admitting: Internal Medicine

## 2023-03-27 DIAGNOSIS — E11621 Type 2 diabetes mellitus with foot ulcer: Secondary | ICD-10-CM | POA: Diagnosis not present

## 2023-03-27 LAB — GLUCOSE, CAPILLARY
Glucose-Capillary: 179 mg/dL — ABNORMAL HIGH (ref 70–99)
Glucose-Capillary: 161 mg/dL — ABNORMAL HIGH (ref 70–99)

## 2023-03-27 NOTE — Progress Notes (Signed)
BENN, TARVER ([id number]) [id number] Page 1 of 1 Visit Report for 03/26/2023 SuperBill Details Patient Name: Date of Service: [name], [name] 03/26/2023 Medical Record Number: [id number] Patient Account Number: [id number] Date of Birth/Sex: Treating RN: 11/20/1945 (77 y.o. 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 E11.42 Type 2 diabetes mellitus with diabetic polyneuropathy E11.51 Type 2 diabetes mellitus with diabetic peripheral angiopathy without gangrene Facility Procedures CPT4 Code Description Modifier Quantity [id number]-(Facility Use Only) HBOT full body chamber, , 4 ICD-10 Diagnosis Description 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 E11.42 Type 2 diabetes mellitus with diabetic polyneuropathy Physician Procedures Quantity CPT4 Code Description Modifier [id number] - WC PHYS HYPERBARIC OXYGEN THERAPY 1 ICD-10 Diagnosis Description 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

## 2023-03-27 NOTE — Progress Notes (Signed)
BROK, STOCKING ([id number]) [id number] Page 1 of 7 Visit Report for 03/26/2023 HPI Details Patient Name: Date of Service: [name], [name] S. 03/26/2023 12:30 PM Medical Record Number: [id number] Patient Account Number: [id number] Date of Birth/Sex: Treating RN: 15-May-1946 (77 y.o. M) Primary Care Provider: [name] Other Clinician: Referring Provider: Treating Provider/Extender: [name], [place] [facility] in Treatment: 4 History of Present Illness HPI Description: ADMISSION 02/26/2023 Patient is Billy 77 year old man with type 2 diabetes. He has had Billy 52-month history of wounds on his bilateral heels just above the heel tips. They have been using Iodoflex/Iodosorb ointment. The patient has Billy history of PAD and has Billy SFA stent on the right. He also also status post left iliac stent. Furthermore he has Billy history of carotid artery stenosis status post right carotid endarterectomy and Billy left subclavian artery stenosis. His wound started on his blisters on the heel that of open to wounds with minimal but some depth right greater than left. He also has advanced diabetic neuropathy. The patient is cared for through the [place] and has been referred here for hyperbaric oxygen treatment through his vascular surgeon. Past medical history includes hypertension, hyperlipidemia, type 2 diabetes chronic kidney disease stage IIIb, carotid artery stenosis status post right carotid endarterectomy, left subclavian artery stenosis ABIs done in our clinic were 1.11 on the right and 0.75 on the left these were remarkably similar to what was quoted during the vascular surgery appointment from 02/05/2023. 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 He was referred for hyperbaric oxygen by the [place]. His chest x-ray I think shows vascular crowding but no pneumothorax. He has been using Iodoflex on the wound with  reasonably good improvement this week. He sees his ENT in 2 days and then they will make Billy decision about when they want to start HBO 10/24; patient is started HBO I believe he is on treatment 5 today. He had some problems with his right ear on the second dive however he has had no problems since and says he feels well. He slept through treatment yesterday. He has been using Iodosorb ointment on the wounds on both heels for Billy long period even before he came here. Unfortunately the wound surface still does not look completely as viable as I would like. 11/5; he continues to tolerate HBO fairly well although he is having some fullness in his left ear that is reversed by popping his left ear. He has been using Sorbact hydrogel. He comes in today with the wounds quite Billy bit smaller especially on the right but also on the left. He is fairly rigorous and offloading these with heel offloading shoes. Electronic Signature(s) Signed: 03/26/2023 4:32:14 PM By: Billy Najjar MD Entered By: [name] on 03/26/2023 13:08:32 -------------------------------------------------------------------------------- Physical Exam Details Patient Name: Date of Service: [name], [name] S. 03/26/2023 12:30 PM Medical Record Number: [id number] Patient Account Number: [id number] Date of Birth/Sex: Treating RN: 05-Feb-1946 (77 y.o. M) Primary Care Provider: [name] Other Clinician: Referring Provider: Treating Provider/Extender: [name] [place] [facility] in Treatment: 4 Notes Wound exam; the area on the left heel has come in nicely. Better looking wound surface surface area is better. On the right this is slightly larger surface looks better. No need for debridement in either area. No evidence of surrounding infection Electronic Signature(s) Signed: 03/26/2023 4:32:14 PM By: Billy Najjar MD Entered By: [name] on 03/26/2023  13:09:17 [name], [name] ([id number])  [id number] Page 2 of 7 -------------------------------------------------------------------------------- Physician Orders Details Patient Name: Date of Service: [name], [name] S. 03/26/2023 12:30 PM Medical Record Number: [id number] Patient Account Number: [id number] Date of Birth/Sex: Treating RN: 14-Jan-1946 (77 y.o. 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 - Dr. Leanord Hawking 04/01/2023 12:30pm Return Billy ppointment in 2 weeks. - Dr. Leanord Hawking 04/09/2023 1230pm Return Billy Return appointment in 1 month. - Dr. Leanord Hawking (front office to schedule appts) in December. ****Will skip wound care appt week of Thanksgiving. Will continue Hyperbarics.**** Once wounds close will discontinue hyperbarics. Anesthetic (In clinic) Topical Lidocaine 4% applied to wound bed - In clinic Bathing/ Shower/ Hygiene May shower with protection but do not get wound dressing(s) wet. Protect dressing(s) with water repellant cover (for example, large plastic bag) or Billy cast cover and may then take shower. Off-Loading Wound #1 Left,Posterior Calcaneus Wedge shoe to: - Off loading shoes bilaterally to alleviate pressure from heels. Turn and reposition every 2  hours Prevalon Boot - bilaterally at bedtime Wound #2 Right,Posterior Calcaneus Wedge shoe to: - Off loading shoes bilaterally to alleviate pressure from heels. Turn and reposition every 2 hours Prevalon Boot - bilaterally at bedtime Hyperbaric Oxygen Therapy Wound #1 Left,Posterior Calcaneus Evaluate for HBO Therapy - Per patient, he has claustrophobia. Evaluate for HBO Therapy - Per patient, he has claustrophobia. If appropriate for treatment, begin HBOT per protocol: 2.0 ATA for 90 Minutes without Billy Breaks ir 2.0 ATA for 90 Minutes without Billy Breaks ir Total Number of Treatments: - 40 One treatments per day (delivered Monday through Friday unless otherwise specified in Special Instructions below): One treatments per day (delivered Monday through Friday unless otherwise specified in Special Instructions below): Finger stick Blood Glucose Pre- and Post- HBOT Treatment. Finger stick Blood Glucose Pre- and Post- HBOT Treatment. Follow Hyperbaric Oxygen Glycemia Protocol Follow Hyperbaric Oxygen Glycemia Protocol Give two 4oz orange juices in addition to Glucerna when the glycemic protocol is used. Give two 4oz orange juices in addition to Glucerna when the glycemic protocol is used. Billy frin (Oxymetazoline HCL) 0.05% nasal spray - 1 spray in both nostrils daily as needed prior to HBO treatment for difficulty clearing ears Billy frin (Oxymetazoline HCL) 0.05% nasal spray - 1 spray in both nostrils daily as needed prior to HBO treatment for difficulty clearing ears Wound Treatment Wound #1 - Calcaneus Wound Laterality: Left, Posterior Cleanser: Wound Cleanser Every Other Day/30 Days LUNDON, VERDEJO ([id number]) [phone].pdf Page 3 of 7 Discharge Instructions: Cleanse the wound with wound cleanser prior to applying Billy clean dressing using gauze sponges, not tissue or cotton balls. Topical: Skintegrity Hydrogel 4 (oz) Every Other Day/30 Days Discharge Instructions: Apply  hydrogel as directed Prim Dressing: Cutimed Sorbact 1.5 inx23/8 in Every Other Day/30 Days ary Discharge Instructions: with Hydrogel into the wound bed Secondary Dressing: ALLEVYN Heel 4 1/2in x 5 1/2in / 10.5cm x  13.5cm (Generic) Every Other Day/30 Days Discharge Instructions: Apply over primary dressing as directed. Secured With: [employer], 4.5x3.1 (in/yd) (Generic) Every Other Day/30 Days Discharge Instructions: Secure with Kerlix as directed. Secured With: 51M Medipore H Soft Cloth Surgical T ape, 4 x 10 (in/yd) (Generic) Every Other Day/30 Days Discharge Instructions: Secure with tape as directed. Secured With: Tubular Netting #5 Every Other Day/30 Days Discharge Instructions: secure kerlix in place. Wound #2 - Calcaneus Wound Laterality: Right, Posterior Cleanser: Wound Cleanser Every Other Day/30 Days Discharge Instructions: Cleanse the wound with wound cleanser prior to applying Billy clean dressing using gauze sponges, not tissue or cotton balls. Topical: Skintegrity Hydrogel 4 (oz) Every Other Day/30 Days Discharge Instructions: Apply hydrogel as directed Prim Dressing: Cutimed Sorbact 1.5 inx23/8 in Every Other Day/30 Days ary Discharge Instructions: with Hydrogel into the wound bed Secondary Dressing: ALLEVYN Heel 4 1/2in x 5 1/2in / 10.5cm x 13.5cm (Generic) Every Other Day/30 Days Discharge Instructions: Apply over primary dressing as directed. Secured With: [employer], 4.5x3.1 (in/yd) (Generic) Every Other Day/30 Days Discharge Instructions: Secure with Kerlix as directed. Secured With: 51M Medipore H Soft Cloth Surgical T ape, 4 x 10 (in/yd) (Generic) Every Other Day/30 Days Discharge Instructions: Secure with tape as directed. Secured With: Tubular Netting #5 Every Other Day/30 Days Discharge Instructions: secure kerlix in place. GLYCEMIA INTERVENTIONS PROTOCOL PRE-HBO GLYCEMIA INTERVENTIONS ACTION INTERVENTION Obtain pre-HBO capillary blood glucose  (ensure 1 physician order is in chart). Billy. Notify HBO physician and await physician orders. 2 If result is 70 mg/dl or below: B. If the result meets the hospital definition of Billy critical result, follow hospital policy. Billy. Give patient an 8 ounce Glucerna Shake, an 8 ounce Ensure, or 8 ounces of Billy Glucerna/Ensure equivalent dietary supplement*. B. Wait 30 minutes. If result is 71 mg/dl to 161 mg/dl: C. Retest patients capillary blood glucose (CBG). D. If result greater than or equal to 110 mg/dl, proceed with HBO. If result less than 110 mg/dl, notify HBO physician and consider holding HBO. If result is 131 mg/dl to 096 mg/dl: Billy. Proceed with HBO. Billy. Notify HBO physician and await physician orders. B. It is recommended to hold HBO and do If result is 250 mg/dl or greater: blood/urine ketone testing. C. If the result meets the hospital definition of Billy critical result, follow hospital policy. POST-HBO GLYCEMIA INTERVENTIONS ACTION INTERVENTION Obtain post HBO capillary blood glucose (ensure 1 physician order is in chart). Billy. Notify HBO physician and await physician orders. 2 If result is 70 mg/dl or below: B. If the result meets the hospital definition of Billy critical result, follow hospital policy. Billy. Give patient an 8 ounce Glucerna Shake, an 8 ounce Ensure, or 8 ounces of Billy Glucerna/Ensure equivalent dietary [name], [name] ([id number]) [id number] Page 4 of 7 supplement*. B. Wait 15 minutes for symptoms of If result is 71 mg/dl to 914 mg/dl: hypoglycemia (i.e. nervousness, anxiety, sweating, chills, clamminess, irritability, confusion, tachycardia or dizziness). C. If patient asymptomatic, discharge patient. If patient symptomatic, repeat capillary blood glucose (CBG) and notify HBO physician. If result is 101 mg/dl to 782 mg/dl: Billy. Discharge patient. Billy. Notify HBO physician and await physician orders. B. It is recommended to do  blood/urine ketone If result is 250 mg/dl or greater: testing. C. If the result meets the hospital definition of Billy critical result, follow hospital policy. *Juice or candies are NOT equivalent products. If patient refuses the Glucerna or Ensure, please consult the hospital dietitian for an  appropriate substitute. Electronic Signature(s) Signed: 03/26/2023 4:32:14 PM By: Billy Najjar MD Signed: 03/26/2023 4:47:30 PM By: Billy Stall RN, BSN Entered By: [name] on 03/26/2023 13:06:13 -------------------------------------------------------------------------------- Problem List Details Patient Name: Date of Service: [name], [name] S. 03/26/2023 12:30 PM Medical Record Number: [id number] Patient Account Number: [id number] Date of Birth/Sex: Treating RN: 12-19-1945 (77 y.o. 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 with other specified 02/26/2023 No Yes severity 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 with other specified 02/26/2023 No Yes severity E11.42 Type 2 diabetes mellitus with diabetic polyneuropathy 02/26/2023 No Yes E11.51 Type 2 diabetes mellitus with diabetic peripheral angiopathy without gangrene 02/26/2023 No Yes Inactive Problems Resolved Problems Electronic Signature(s) Signed: 03/26/2023 4:32:14 PM By: Billy Najjar MD Entered By: [name] on 03/26/2023 13:07:10 [name] ([id number]) [id number] Page 5 of 7 -------------------------------------------------------------------------------- Progress Note Details Patient Name: Date of Service: [name], [name]  S. 03/26/2023 12:30 PM Medical Record Number: [id number] Patient Account Number: [id number] Date of Birth/Sex: Treating RN: Oct 29, 1945 (77 y.o. M) Primary Care Provider: [name] Other Clinician: Referring Provider: Treating Provider/Extender: [name] [place] [facility] in Treatment: 4 Subjective History of Present Illness (HPI) ADMISSION 02/26/2023 Patient is Billy 77 year old man with type 2 diabetes. He has had Billy 43-month history of wounds on his bilateral heels just above the heel tips. They have been using Iodoflex/Iodosorb ointment. The patient has Billy history of PAD and has Billy SFA stent on the right. He also also status post left iliac stent. Furthermore he has Billy history of carotid artery stenosis status post right carotid endarterectomy and Billy left subclavian artery stenosis. His wound started on his blisters on the heel that of open to wounds with minimal but some depth right greater than left. He also has advanced diabetic neuropathy. The patient is cared for through the [place] and has been referred here for hyperbaric oxygen treatment through his vascular surgeon. Past medical history includes hypertension, hyperlipidemia, type 2 diabetes chronic kidney disease stage IIIb, carotid artery stenosis status post right carotid endarterectomy, left subclavian artery stenosis ABIs done in our clinic were 1.11 on the right and 0.75 on the left these were remarkably similar to what was quoted during the vascular surgery appointment from 02/05/2023. 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 He was referred for hyperbaric oxygen by the [place]. His chest x-ray I think shows vascular crowding but no pneumothorax. He has been using Iodoflex on the wound with reasonably good improvement this week. He sees his ENT in 2 days and then they will make Billy decision about when they want to start HBO 10/24; patient is started HBO I believe he  is on treatment 5 today. He had some problems with his right ear on the second dive however he has had no problems since and says he feels well. He slept through treatment yesterday. He has been using Iodosorb ointment on the wounds on both heels for Billy long period even before he came here. Unfortunately the wound surface still does not look completely as viable as I would like. 11/5; he continues to tolerate HBO fairly well although he is  having some fullness in his left ear that is reversed by popping his left ear. He has been using Sorbact hydrogel. He comes in today with the wounds quite Billy bit smaller especially on the right but also on the left. He is fairly rigorous and offloading these with heel offloading shoes. Objective Constitutional Vitals Time Taken: 12:41 PM, Height: 70 in, Weight: 196 lbs, BMI: 28.1. Integumentary (Hair, Skin) Wound #1 status is Open. Original cause of wound was Blister. The date acquired was: 01/20/2023. The wound has been in treatment 4 weeks. The wound is located on the Left,Posterior Calcaneus. The wound measures 1.3cm length x 0.5cm width x 0.3cm depth; 0.511cm^2 area and 0.153cm^3 volume. There is Fat Layer (Subcutaneous Tissue) exposed. There is no tunneling or undermining noted. There is Billy medium amount of serosanguineous drainage noted. The wound margin is distinct with the outline attached to the wound base. There is medium (34-66%) red, pink granulation within the wound bed. There is Billy medium (34-66%) amount of necrotic tissue within the wound bed including Adherent Slough. The periwound skin appearance had no abnormalities noted for texture. The periwound skin appearance had no abnormalities noted for color. The periwound skin appearance exhibited: Dry/Scaly. Wound #2 status is Open. Original cause of wound was Blister. The date acquired was: 01/20/2023. The wound has been in treatment 4 weeks. The wound is located on the Right,Posterior Calcaneus. The wound  measures 0.4cm length x 0.5cm width x 0.1cm depth; 0.157cm^2 area and 0.016cm^3 volume. There is Fat Layer (Subcutaneous Tissue) exposed. There is no tunneling or undermining noted. There is Billy medium amount of serosanguineous drainage noted. The wound margin is distinct with the outline attached to the wound base. There is large (67-100%) red, pink granulation within the wound bed. There is no necrotic tissue within the wound bed. The periwound skin appearance had no abnormalities noted for texture. The periwound skin appearance had no abnormalities noted for color. The periwound skin appearance exhibited: Dry/Scaly. 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 - Dr. Leanord Hawking 04/01/2023 12:30pm Return Appointment in 2 weeks. - Dr. Leanord Hawking 04/09/2023 1230pm Return appointment in 1 month. - Dr. Leanord Hawking (front office to schedule appts) in December. ****Will skip wound care appt week of Thanksgiving. Will continue Hyperbarics.**** Once wounds close will discontinue hyperbarics. Anesthetic: (In clinic) Topical Lidocaine 4% applied to wound bed - In clinic Bathing/ Shower/ Hygiene: May shower with protection but do not get wound dressing(s) wet. Protect dressing(s) with water repellant cover (for example, large plastic bag) or Billy cast cover and may then take shower. Off-Loading: Wound #1 Left,Posterior Calcaneus: Wedge shoe to: - Off loading shoes bilaterally to alleviate pressure from heels. Turn and reposition every 2 hours Prevalon Boot  - bilaterally at bedtime Wound #2 Right,Posterior Calcaneus: Wedge shoe to: - Off loading shoes bilaterally to alleviate pressure from heels. Turn and reposition every 2 hours Prevalon Boot - bilaterally at bedtime Hyperbaric Oxygen Therapy: Wound #1 Left,Posterior Calcaneus: Evaluate for HBO Therapy - Per patient, he has claustrophobia. Evaluate for HBO Therapy - Per patient, he has claustrophobia. If appropriate for treatment, begin HBOT per protocol: 2.0 ATA for 90  Minutes without Air Breaks 2.0 ATA for 90 Minutes without Air Breaks T Number of Treatments: - 40 otal One treatments per day (delivered Monday through Friday unless otherwise specified in Special Instructions below): One treatments per day (delivered Monday through Friday unless otherwise specified in Special Instructions below): Finger stick Blood Glucose Pre- and Post- HBOT Treatment. Finger stick Blood Glucose Pre- and Post- HBOT Treatment. Follow Hyperbaric Oxygen Glycemia Protocol Follow Hyperbaric Oxygen Glycemia Protocol Give two 4oz orange juices in addition to Glucerna when the glycemic protocol is used. Give two 4oz orange juices in addition to Glucerna when the glycemic protocol is used. Afrin (Oxymetazoline HCL) 0.05% nasal spray - 1 spray in both nostrils daily as needed prior to HBO treatment for difficulty clearing ears Afrin (Oxymetazoline HCL) 0.05% nasal spray - 1 spray in both nostrils daily as needed prior to HBO treatment for difficulty clearing ears WOUND #1: - Calcaneus Wound Laterality: Left, Posterior Cleanser: Wound Cleanser Every Other Day/30 Days Discharge Instructions: Cleanse the wound with wound cleanser prior to applying Billy clean dressing using gauze sponges, not tissue or cotton balls. Topical: Skintegrity Hydrogel 4 (oz) Every Other Day/30 Days Discharge Instructions: Apply hydrogel as directed Prim Dressing: Cutimed Sorbact 1.5 inx23/8 in Every Other Day/30 Days ary Discharge  Instructions: with Hydrogel into the wound bed Secondary Dressing: ALLEVYN Heel 4 1/2in x 5 1/2in / 10.5cm x 13.5cm (Generic) Every Other Day/30 Days Discharge Instructions: Apply over primary dressing as directed. Secured With: [employer], 4.5x3.1 (in/yd) (Generic) Every Other Day/30 Days Discharge Instructions: Secure with Kerlix as directed. Secured With: 417M Medipore H Soft Cloth Surgical T ape, 4 x 10 (in/yd) (Generic) Every Other Day/30 Days Discharge Instructions: Secure with tape as directed. Secured With: Tubular Netting #5 Every Other Day/30 Days Discharge Instructions: secure kerlix in place. WOUND #2: - Calcaneus Wound Laterality: Right, Posterior Cleanser: Wound Cleanser Every Other Day/30 Days Discharge Instructions: Cleanse the wound with wound cleanser prior to applying Billy clean dressing using gauze sponges, not tissue or cotton balls. Topical: Skintegrity Hydrogel 4 (oz) Every Other Day/30 Days Discharge Instructions: Apply hydrogel as directed Prim Dressing: Cutimed Sorbact 1.5 inx23/8 in Every Other Day/30 Days ary Discharge Instructions: with Hydrogel into the wound bed Secondary Dressing: ALLEVYN Heel 4 1/2in x 5 1/2in / 10.5cm x 13.5cm (Generic) Every Other Day/30 Days Discharge Instructions: Apply over primary dressing as directed. Secured With: [employer], 4.5x3.1 (in/yd) (Generic) Every Other Day/30 Days Discharge Instructions: Secure with Kerlix as directed. Secured With: 417M Medipore H Soft Cloth Surgical T ape, 4 x 10 (in/yd) (Generic) Every Other Day/30 Days Discharge Instructions: Secure with tape as directed. Secured With: Tubular Netting #5 Every Other Day/30 Days Discharge Instructions: secure kerlix in place. 1Continuing with Sorbact and hydrogel 2Will check his left ear tomorrow when he comes in for HBO. 3. Nice improvement in the surface area both wounds Electronic Signature(s) [name], [name] ([id number])  [id number] Page 7 of 7 Signed: 03/26/2023 4:32:14 PM By: Billy Najjar MD Entered By: [name] on 03/26/2023 13:10:38 -------------------------------------------------------------------------------- SuperBill Details Patient Name: Date of Service: [name], [name] S. 03/26/2023 Medical Record Number: [id number] Patient Account Number: [id number] Date of Birth/Sex: Treating RN: 05-24-1945 (77 y.o. 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 peripheral angiopathy without gangrene Facility Procedures : CPT4 Code: 16109604 Description: 99214 - WOUND CARE VISIT-LEV 4 EST PT Modifier: Quantity: 1 Physician Procedures : CPT4 Code Description Modifier [id number] 99213 - WC PHYS LEVEL 3 - EST PT ICD-10 Diagnosis Description 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

## 2023-03-28 ENCOUNTER — Encounter (HOSPITAL_BASED_OUTPATIENT_CLINIC_OR_DEPARTMENT_OTHER): Payer: No Typology Code available for payment source | Admitting: Internal Medicine

## 2023-03-28 DIAGNOSIS — E11621 Type 2 diabetes mellitus with foot ulcer: Secondary | ICD-10-CM | POA: Diagnosis not present

## 2023-03-28 LAB — GLUCOSE, CAPILLARY
Glucose-Capillary: 173 mg/dL — ABNORMAL HIGH (ref 70–99)
Glucose-Capillary: 173 mg/dL — ABNORMAL HIGH (ref 70–99)

## 2023-03-28 NOTE — Progress Notes (Signed)
[name], [name] ([id number]) [id number] Page 1 of 1 Visit Report for 03/27/2023 SuperBill Details Patient Name: Date of Service: [name], [name] 03/27/2023 Medical Record Number: [id number] Patient Account Number: [id number] Date of Birth/Sex: Treating RN: 1945/08/03 (77 y.o. 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 E11.42 Type 2 diabetes mellitus with diabetic polyneuropathy E11.51 Type 2 diabetes mellitus with diabetic peripheral angiopathy without gangrene Facility Procedures CPT4 Code Description Modifier Quantity [id number] G0277-(Facility Use Only) HBOT full body chamber, , 4 ICD-10 Diagnosis Description E11.621 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 E11.42 Type 2 diabetes mellitus with diabetic polyneuropathy Physician Procedures Quantity CPT4 Code Description Modifier [id number] 99183 - WC PHYS HYPERBARIC OXYGEN THERAPY 1 ICD-10 Diagnosis Description E11.621 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

## 2023-03-28 NOTE — Progress Notes (Addendum)
GRANDON, TORSIELLO ([id number]) [id number] Page 1 of 3 Visit Report for 03/28/2023 HBO Details Patient Name: Date of Service: [name], [name] 03/28/2023 10:00 A M Medical Record Number: [id number] Patient Account Number: [id number] Date of Birth/Sex: Treating RN: 1946-01-25 (77 y.o. [name] Primary Care Vash Quezada: [name] Other Clinician: [name] Referring Lurene Robley: Treating Orland Visconti/Extender: [name] [place] in Treatment: 4 HBO Treatment Course Details Treatment Course Number: 1 Ordering Atreyu Mak: [name] T Treatments Ordered: otal 40 HBO Treatment Start Date: 03/11/2023 HBO Indication: Diabetic Ulcer(s) of the Lower Extremity Standard/Conservative Wound Care tried and failed greater than or equal to 30 days Wound #1 Left, Posterior Calcaneus , Wound #2 Right, Posterior Calcaneus HBO Treatment Details Treatment Number: 10 Patient Type: Outpatient Chamber Type: Monoplace Chamber Serial #: [id number] Treatment Protocol: 2.0 ATA with 90 minutes oxygen, and no air breaks Treatment Details Compression Rate Down: 1.0 psi / minute De-Compression Rate Up: 1.5 psi / minute Air breaks and breathing Decompress Decompress Compress Tx Pressure Begins Reached periods Begins Ends (leave unused spaces blank) Chamber Pressure (ATA 1 2 ------2 1 ) Clock Time (24 hr) 09:07 09:25 - - - - - - 10:55 11:05 Treatment Length: 118 (minutes) Treatment Segments: 4 Vital Signs Capillary Blood Glucose Reference Range: 80 - 120 mg / dl HBO Diabetic Blood Glucose Intervention Range: <131 mg/dl or >244 mg/dl Type: Time Vitals Blood Respiratory Capillary Blood Glucose Pulse Action Pulse: Temperature: Taken: Pressure: Rate: Glucose (mg/dl): Meter #: Oximetry (%) Taken: Pre 08:52 151/66 67 12 98.1 173 none per protocol Post 11:08 148/75 62 12 97.9 none per protocol Treatment Response Treatment  Toleration: Well Treatment Completion Status: Treatment Completed without Adverse Event Treatment Notes Mr. Depa arrived with normal vital signs. He stated that he ate breakfast. Patient self-administered Afrin. After performing a safety check, the patient was placed in the chamber which was compressed with 100% oxygen at a rate set of 1.5 psi/min gradually increased after confirming normal ear equalization (10psig). Mr. Ninnemann tolerated the treatment and subsequent decompression at the rate of 1.5 psi/min. He denied any issues with ear equalization and/or pain associated with barotrauma. Post-treatment vitals were within normal range. He was stable upon discharge with his wife. Additional Procedure Documentation Tissue Sevierity: Fat layer exposed Adrianna Dudas Notes No concerns with treatment given Physician HBO Attestation: I certify that I supervised this HBO treatment in accordance with Medicare guidelines. A trained emergency response team is readily available per Yes BERTRAND, BROSNAN ([id number]) [id number] Page 2 of 3 hospital policies and procedures. Continue HBOT as ordered. Yes Electronic Signature(s) Signed: 03/29/2023 1:11:16 PM By: [name] CHT EMT BS , , Signed: 04/01/2023 4:37:17 PM By: [name] MD Previous Signature: 03/28/2023 4:34:18 PM Version By: [name] MD Previous Signature: 03/28/2023 11:32:44 AM Version By: [name] CHT EMT BS , , Previous Signature: 03/28/2023 11:25:30 AM Version By: [name] CHT EMT BS , , Previous Signature: 03/28/2023 10:56:01 AM Version By: [name] CHT EMT BS , , Entered By: [name] on 03/29/2023 10:11:16 -------------------------------------------------------------------------------- HBO Safety Checklist Details Patient Name: Date of Service: [name], [name] 03/28/2023 10:00 A M Medical Record Number: [id number] Patient Account Number: [id number] Date of  Birth/Sex: Treating RN: December 15, 1945 (77 y.o. [name] Primary Care Luc Shammas: [name] Other Clinician: [name] Referring Laurajean Hosek: Treating Sharman Garrott/Extender: [name] [place] in Treatment: 4 HBO Safety Checklist Items Safety Checklist Consent Form Signed Patient voided / foley secured  and emptied When did you last eato 0815 - SE Biscuit, HB Last dose of injectable or oral agent 0800 - Metformin Ostomy pouch emptied and vented if applicable NA All implantable devices assessed, documented and approved NA Intravenous access site secured and place NA Valuables secured Linens and cotton and cotton/polyester blend (less than 51% polyester) Personal oil-based products / skin lotions / body lotions removed Wigs or hairpieces removed NA Smoking or tobacco materials removed NA Books / newspapers / magazines / loose paper removed Cologne, aftershave, perfume and deodorant removed Jewelry removed (may wrap wedding band) Make-up removed NA Hair care products removed Battery operated devices (external) removed Heating patches and chemical warmers removed Titanium eyewear removed Nail polish cured greater than 10 hours NA Casting material cured greater than 10 hours NA Hearing aids removed NA Loose dentures or partials removed dentures removed Prosthetics have been removed NA Patient demonstrates correct use of air break device (if applicable) Patient concerns have been addressed Patient grounding bracelet on and cord attached to chamber Specifics for Inpatients (complete in addition to above) Medication sheet sent with patient NA Intravenous medications needed or due during therapy sent with patient NA Drainage tubes (e.g. nasogastric tube or chest tube secured and vented) NA Endotracheal or Tracheotomy tube secured NA Cuff deflated of air and inflated with saline NA Airway suctioned NA Notes Paper version  used prior to treatment start. TEREK, DEBAR ([id number]) [id number] Page 3 of 3 Electronic Signature(s) Signed: 03/28/2023 11:32:31 AM By: [name] CHT EMT BS , , Previous Signature: 03/28/2023 10:51:48 AM Version By: [name] CHT EMT BS , , Entered By: [name] on 03/28/2023 08:32:30

## 2023-03-28 NOTE — Progress Notes (Signed)
LARRELL, RAPOZO ([id number]) [id number] Page 1 of 1 Visit Report for 03/28/2023 SuperBill Details Patient Name: Date of Service: [name], [name] 03/28/2023 Medical Record Number: [id number] Patient Account Number: [id number] Date of Birth/Sex: Treating RN: 26-Dec-1945 (77 y.o. 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 E11.42 Type 2 diabetes mellitus with diabetic polyneuropathy E11.51 Type 2 diabetes mellitus with diabetic peripheral angiopathy without gangrene Facility Procedures CPT4 Code Description Modifier Quantity [id number] G0277-(Facility Use Only) HBOT full body chamber, , 4 ICD-10 Diagnosis Description 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 E11.42 Type 2 diabetes mellitus with diabetic polyneuropathy Physician Procedures Quantity CPT4 Code Description Modifier [id number] 99183 - WC PHYS HYPERBARIC OXYGEN THERAPY 1 ICD-10 Diagnosis Description 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

## 2023-03-28 NOTE — Progress Notes (Signed)
[name] ([id number]) [id number] Page 1 of 2 Visit Report for 03/28/2023 Arrival Information Details Patient Name: Date of Service: [name], [name] 03/28/2023 10:00 [name] Medical Record Number: [id number] Patient Account Number: [id number] Date of Birth/Sex: Treating RN: Feb 17, 1946 (77 y.o. [name] Primary Care Jacquelynn Friend: [name] Other Clinician: Referring Lathaniel Legate: Treating Hung Rhinesmith/Extender: [name] [place] in Treatment: 4 Visit Information History Since Last Visit All ordered tests and consults were completed: Yes Patient Arrived: [name] Added or deleted any medications: No Arrival Time: 08:48 Any new allergies or adverse reactions: No Accompanied By: spouse Had Billy fall or experienced change in No Transfer Assistance: None activities of daily living that may affect Patient Identification Verified: Yes risk of falls: Secondary Verification Process Completed: Yes Signs or symptoms of abuse/neglect since last visito No Patient Requires Transmission-Based Precautions: No Hospitalized since last visit: No Patient Has Alerts: No Implantable device outside of the clinic excluding No cellular tissue based products placed in the center since last visit: Pain Present Now: No Electronic Signature(s) Signed: 03/28/2023 10:49:26 AM By: [name] CHT EMT BS , , Entered By: [name] on 03/28/2023 07:49:26 -------------------------------------------------------------------------------- Encounter Discharge Information Details Patient Name: Date of Service: [name], [name] 03/28/2023 10:00 [name] Medical Record Number: [id number] Patient Account Number: [id number] Date of Birth/Sex: Treating RN: 31-Mar-1946 (77 y.o. [name] Primary Care Tzipora Mcinroy: [name] Other Clinician: Referring Aleysia Oltmann: Treating Xian Apostol/Extender: Kathaleen Grinder in Treatment: 4 Encounter Discharge Information Items Discharge Condition: Stable Ambulatory Status: Ambulatory Discharge Destination: Home Transportation: Private Auto Accompanied By: spouse Schedule Follow-up Appointment: No Clinical Summary of Care: Electronic Signature(s) Signed: 03/28/2023 11:33:22 AM By: [name] CHT EMT BS , , Entered By: [name] on 03/28/2023 08:33:21 [name] ([id number]) [id number] Page 2 of 2 -------------------------------------------------------------------------------- Vitals Details Patient Name: Date of Service: [name], [name] 03/28/2023 10:00 [name] Medical Record Number: [id number] Patient Account Number: [id number] Date of Birth/Sex: Treating RN: [birth year] (77 y.o. [name] Primary Care Rieley Hausman: [name] Other Clinician: Referring Syaire Saber: Treating Jonas [name]/Extender: [name] [place] in Treatment: 4 Vital Signs Time Taken: 08:52 Temperature (F): 98.1 Height (in): 70 Pulse (bpm): 67 Weight (lbs): 196 Respiratory Rate (breaths/min): 12 Body Mass Index (BMI): 28.1 Blood Pressure (mmHg): 151/66 Capillary Blood Glucose (mg/dl): 034 Reference Range: 80 - 120 mg / dl Electronic Signature(s) Signed: 03/28/2023 10:49:55 AM By: [name] CHT EMT BS , , Entered By: [name] on 03/28/2023 07:49:55

## 2023-03-29 ENCOUNTER — Encounter (HOSPITAL_BASED_OUTPATIENT_CLINIC_OR_DEPARTMENT_OTHER): Payer: No Typology Code available for payment source | Admitting: General Surgery

## 2023-03-29 DIAGNOSIS — E11621 Type 2 diabetes mellitus with foot ulcer: Secondary | ICD-10-CM | POA: Diagnosis not present

## 2023-03-29 LAB — GLUCOSE, CAPILLARY
Glucose-Capillary: 199 mg/dL — ABNORMAL HIGH (ref 70–99)
Glucose-Capillary: 157 mg/dL — ABNORMAL HIGH (ref 70–99)

## 2023-03-29 NOTE — Progress Notes (Signed)
MARKY, MOOSE ([id number]) [id number] Page 1 of 2 Visit Report for 03/29/2023 Arrival Information Details Patient Name: Date of Service: [name], [name] 03/29/2023 9:30 A M Medical Record Number: [id number] Patient Account Number: [id number] Date of Birth/Sex: Treating RN: [birth year] (77 y.o. [name] Primary Care Karter Hellmer: [name] Other Clinician: [name] Referring Suzann Lazaro: Treating Scout Gumbs/Extender: [name] [place] in Treatment: 4 Visit Information History Since Last Visit All ordered tests and consults were completed: Yes Patient Arrived: [name] Added or deleted any medications: No Arrival Time: 08:48 Any new allergies or adverse reactions: No Accompanied By: spouse Had a fall or experienced change in No Transfer Assistance: None activities of daily living that may affect Patient Identification Verified: Yes risk of falls: Secondary Verification Process Completed: Yes Signs or symptoms of abuse/neglect since last visito No Patient Requires Transmission-Based Precautions: No Hospitalized since last visit: No Patient Has Alerts: No Implantable device outside of the clinic excluding No cellular tissue based products placed in the center since last visit: Pain Present Now: No Electronic Signature(s) Signed: 03/29/2023 12:58:53 PM By: [name] CHT EMT BS , , Entered By: [name] on 03/29/2023 12:58:53 -------------------------------------------------------------------------------- Encounter Discharge Information Details Patient Name: Date of Service: [name], [name] 03/29/2023 9:30 A M Medical Record Number: [id number] Patient Account Number: [id number] Date of Birth/Sex: Treating RN: 06-18-1945 (77 y.o. [name] Primary Care Binta Statzer: [name] Other Clinician: [name] Referring Madi Bonfiglio: Treating Lurlene Ronda/Extender: [name] [place] in Treatment: 4 Encounter Discharge Information Items Discharge Condition: Stable Ambulatory Status: Ambulatory Discharge Destination: Home Transportation: Private Auto Accompanied By: spouse Schedule Follow-up Appointment: No Clinical Summary of Care: Electronic Signature(s) Signed: 03/29/2023 1:09:38 PM By: [name] CHT EMT BS , , Entered By: [name] on 03/29/2023 13:09:38 [name] ([id number]) [id number] Page 2 of 2 -------------------------------------------------------------------------------- Vitals Details Patient Name: Date of Service: [name], [name] 03/29/2023 9:30 A M Medical Record Number: [id number] Patient Account Number: [id number] Date of Birth/Sex: Treating RN: Oct 14, 1945 (77 y.o. [name] Primary Care Ayushi Pla: [name] Other Clinician: [name] Referring Jobani Sabado: Treating Jianni Batten/Extender: [name] [place] in Treatment: 4 Vital Signs Time Taken: 09:21 Temperature (F): 97.3 Height (in): 70 Pulse (bpm): 71 Weight (lbs): 196 Respiratory Rate (breaths/min): 18 Body Mass Index (BMI): 28.1 Blood Pressure (mmHg): 116/68 Capillary Blood Glucose (mg/dl): 244 Reference Range: 80 - 120 mg / dl Electronic Signature(s) Signed: 03/29/2023 1:00:01 PM By: [name] CHT EMT BS , , Entered By: [name] on 03/29/2023 13:00:00

## 2023-03-29 NOTE — Progress Notes (Signed)
Billy, [name] ([id number]) [id number] Page 1 of 10 Visit Report for 03/26/2023 Arrival Information Details Patient Name: Date of Service: [name], [name] 03/26/2023 12:30 PM Medical Record Number: [id number] Patient Account Number: [id number] Date of Birth/Sex: Treating RN: Jun 28, 1945 (77 y.o. M) Primary Care Aydien Majette: [name] Other Clinician: Referring Rakayla Ricklefs: Treating Loral Campi/Extender: [name], [place] in Treatment: 4 Visit Information History Since Last Visit Added or deleted any medications: No Patient Arrived: [name] Any new allergies or adverse reactions: No Arrival Time: 12:41 Had a fall or experienced change in No Accompanied By: wife activities of daily living that may affect Transfer Assistance: None risk of falls: Patient Identification Verified: Yes Signs or symptoms of abuse/neglect since last visito No Secondary Verification Process Completed: Yes Hospitalized since last visit: No Patient Requires Transmission-Based Precautions: No Implantable device outside of the clinic excluding No Patient Has Alerts: No cellular tissue based products placed in the center since last visit: Has Dressing in Place as Prescribed: Yes Pain Present Now: No Electronic Signature(s) Signed: 03/29/2023 12:52:25 PM By: Thayer Dallas Entered By: Thayer Dallas on 03/26/2023 12:41:51 -------------------------------------------------------------------------------- Clinic Level of Care Assessment Details Patient Name: Date of Service: [name], [name] 03/26/2023 12:30 PM Medical Record Number: [id number] Patient Account Number: [id number] Date of Birth/Sex: Treating RN: 1946-05-20 (77 y.o. [name] Primary Care Britlyn Martine: [name] Other Clinician: Referring Rosalynn Sergent: Treating Tylasia Fletchall/Extender: [name], [place] in Treatment: 4 Clinic Level of Care Assessment  Items TOOL 4 Quantity Score X- 1 0 Use when only an EandM is performed on FOLLOW-UP visit ASSESSMENTS - Nursing Assessment / Reassessment X- 1 10 Reassessment of Co-morbidities (includes updates in patient status) X- 1 5 Reassessment of Adherence to Treatment Plan ASSESSMENTS - Wound and Skin A ssessment / Reassessment X - Simple Wound Assessment / Reassessment - one wound 1 5 []  - 0 Complex Wound Assessment / Reassessment - multiple wounds X- 1 10 Dermatologic / Skin Assessment (not related to wound area) ASSESSMENTS - Focused Assessment X- 2 5 Circumferential Edema Measurements - multi extremities X- 1 10 Nutritional Assessment / Counseling / Intervention INGVALD, BAYONA ([id number]) [id number] Page 2 of 10 []  - 0 Lower Extremity Assessment (monofilament, tuning fork, pulses) []  - 0 Peripheral Arterial Disease Assessment (using hand held doppler) ASSESSMENTS - Ostomy and/or Continence Assessment and Care []  - 0 Incontinence Assessment and Management []  - 0 Ostomy Care Assessment and Management (repouching, etc.) PROCESS - Coordination of Care []  - 0 Simple Patient / Family Education for ongoing care X- 1 20 Complex (extensive) Patient / Family Education for ongoing care X- 1 10 Staff obtains Chiropractor, Records, T Results / Process Orders est []  - 0 Staff telephones HHA, Nursing Homes / Clarify orders / etc []  - 0 Routine Transfer to another Facility (non-emergent condition) []  - 0 Routine Hospital Admission (non-emergent condition) []  - 0 New Admissions / Manufacturing engineer / Ordering NPWT Apligraf, etc. , []  - 0 Emergency Hospital Admission (emergent condition) []  - 0 Simple Discharge Coordination X- 1 15 Complex (extensive) Discharge Coordination PROCESS - Special Needs []  - 0 Pediatric / Minor Patient Management []  - 0 Isolation Patient Management []  - 0 Hearing / Language / Visual special needs []  - 0 Assessment of  Community assistance (transportation, D/C planning, etc.) []  - 0 Additional assistance / Altered mentation []  - 0 Support Surface(s) Assessment (bed, cushion, seat, etc.) INTERVENTIONS - Wound Cleansing / Measurement []  - 0 Simple Wound Cleansing -  one wound X- 2 5 Complex Wound Cleansing - multiple wounds X- 1 5 Wound Imaging (photographs - any number of wounds) []  - 0 Wound Tracing (instead of photographs) []  - 0 Simple Wound Measurement - one wound X- 2 5 Complex Wound Measurement - multiple wounds INTERVENTIONS - Wound Dressings []  - 0 Small Wound Dressing one or multiple wounds X- 2 15 Medium Wound Dressing one or multiple wounds []  - 0 Large Wound Dressing one or multiple wounds []  - 0 Application of Medications - topical []  - 0 Application of Medications - injection INTERVENTIONS - Miscellaneous []  - 0 External ear exam []  - 0 Specimen Collection (cultures, biopsies, blood, body fluids, etc.) []  - 0 Specimen(s) / Culture(s) sent or taken to Lab for analysis []  - 0 Patient Transfer (multiple staff / Nurse, adult / Similar devices) []  - 0 Simple Staple / Suture removal (25 or less) []  - 0 Complex Staple / Suture removal (26 or more) []  - 0 Hypo / Hyperglycemic Management (close monitor of Blood Glucose) FERNANDEZ, WEITZEL ([id number]) [phone].pdf Page 3 of 10 []  - 0 Ankle / Brachial Index (ABI) - do not check if billed separately X- 1 5 Vital Signs Has the patient been seen at the hospital within the last three years: Yes Total Score: 155 Level Of Care: New/Established - Level 4 Electronic Signature(s) Signed: 03/26/2023 4:47:30 PM By: Shawn Stall RN, BSN Entered By: Shawn Stall on 03/26/2023 13:07:21 -------------------------------------------------------------------------------- Encounter Discharge Information Details Patient Name: Date of Service: [name], [name] 03/26/2023 12:30 PM Medical Record Number:  [id number] Patient Account Number: [id number] Date of Birth/Sex: Treating RN: [birth year] (77 y.o. [name] Primary Care Minard Millirons: [name] Other Clinician: Referring Teairra Millar: Treating Tyree Vandruff/Extender: [name] [place] in Treatment: 4 Encounter Discharge Information Items Discharge Condition: Stable Ambulatory Status: Walker Discharge Destination: Home Transportation: Private Auto Accompanied By: wife Schedule Follow-up Appointment: Yes Clinical Summary of Care: Electronic Signature(s) Signed: 03/26/2023 4:47:30 PM By: Shawn Stall RN, BSN Entered By: Shawn Stall on 03/26/2023 13:07:56 -------------------------------------------------------------------------------- Lower Extremity Assessment Details Patient Name: Date of Service: [name], [name] 03/26/2023 12:30 PM Medical Record Number: [id number] Patient Account Number: [id number] Date of Birth/Sex: Treating RN: Jan 28, 1946 (77 y.o. M) Primary Care Marilu Rylander: [name] Other Clinician: Referring Loria Lacina: Treating Felica Chargois/Extender: Leota Jacobsen Weeks in Treatment: 4 Edema Assessment Assessed: [Left: No] [Right: No] Edema: [Left: No] [Right: No] Calf Left: Right: Point of Measurement: From Medial Instep 32.9 cm 32 cm Ankle Left: Right: Point of Measurement: From Medial Instep 2 cm 22.5 cm Vascular Assessment OAKLEY, COGGINS ([id number]) [Right:[id number] Page 4 of 10] Extremity colors, hair growth, and conditions: Extremity Color: [Left:Normal] [Right:Normal] Hair Growth on Extremity: [Left:No] [Right:No] Temperature of Extremity: [Left:Warm] [Right:Warm] Capillary Refill: [Left:< 3 seconds] [Right:< 3 seconds] Dependent Rubor: [Left:No No] [Right:No No] Electronic Signature(s) Signed: 03/29/2023 12:52:25 PM By: Thayer Dallas Entered By: Thayer Dallas on 03/26/2023  12:52:46 -------------------------------------------------------------------------------- Multi Wound Chart Details Patient Name: Date of Service: [name], [name] 03/26/2023 12:30 PM Medical Record Number: [id number] Patient Account Number: [id number] Date of Birth/Sex: Treating RN: January 23, 1946 (77 y.o. M) Primary Care Sahvannah Rieser: [name] Other Clinician: Referring Alyzza Andringa: Treating Lewanna Petrak/Extender: Kathaleen Grinder in Treatment: 4 [1:Photos:] [N/A:N/A] Left, Posterior Calcaneus Right, Posterior Calcaneus N/A Wound Location: Blister Blister N/A Wounding Event: Diabetic Wound/Ulcer of the Lower Diabetic Wound/Ulcer of the Lower N/A Primary Etiology: Extremity Extremity Arrhythmia, Coronary Artery Disease, Arrhythmia, Coronary  Artery Disease, N/A Comorbid History: Hypertension, Peripheral Venous Hypertension, Peripheral Venous Disease, Type II Diabetes, Disease, Type II Diabetes, Osteoarthritis, Neuropathy Osteoarthritis, Neuropathy 01/20/2023 01/20/2023 N/A Date Acquired: 4 4 N/A Weeks of Treatment: Open Open N/A Wound Status: No No N/A Wound Recurrence: 1.3x0.5x0.3 0.4x0.5x0.1 N/A Measurements L x W x D (cm) 0.511 0.157 N/A A (cm) : rea 0.153 0.016 N/A Volume (cm) : 69.90% 86.10% N/A % Reduction in A rea: 54.90% 95.30% N/A % Reduction in Volume: Grade 3 Grade 2 N/A Classification: Medium Medium N/A Exudate A mount: Serosanguineous Serosanguineous N/A Exudate Type: red, brown red, brown N/A Exudate Color: Distinct, outline attached Distinct, outline attached N/A Wound Margin: Medium (34-66%) Large (67-100%) N/A Granulation A mount: Red, Pink Red, Pink N/A Granulation Quality: Medium (34-66%) None Present (0%) N/A Necrotic A mount: Fat Layer (Subcutaneous Tissue): Yes Fat Layer (Subcutaneous Tissue): Yes N/A Exposed Structures: Fascia: No Fascia: No Tendon: No Tendon: No Muscle: No Muscle: No Joint: No Joint:  No Bone: No Bone: No Medium (34-66%) Small (1-33%) N/A Epithelialization: No Abnormalities Noted No Abnormalities Noted N/A Periwound Skin Texture: Dry/Scaly: Yes Dry/Scaly: Yes N/A Periwound Skin Moisture: No Abnormalities Noted No Abnormalities Noted N/A Periwound Skin Color: Treatment Notes LACEY, CHAUSSEE ([id number]) [phone].pdf Page 5 of 10 Electronic Signature(s) Signed: 03/26/2023 4:32:14 PM By: [name] MD Entered By: [name] on 03/26/2023 13:07:15 -------------------------------------------------------------------------------- Multi-Disciplinary Care Plan Details Patient Name: Date of Service: [name], [name] 03/26/2023 12:30 PM Medical Record Number: [id number] Patient Account Number: [id number] Date of Birth/Sex: Treating RN: June 11, 1945 (77 y.o. [name] Primary Care Zhaniya Swallows: [name] Other Clinician: Referring Valor Turber[place]: Treating Octavia Mottola/Extender: [name] [place] in Treatment: 4 Active Inactive Wound/Skin Impairment Nursing Diagnoses: Knowledge deficit related to smoking impact on wound healing Goals: Patient/caregiver will verbalize understanding of skin care regimen Date Initiated: 02/26/2023 Target Resolution Date: 04/20/2023 Goal Status: Active Interventions: Assess patient/caregiver ability to obtain necessary supplies Assess patient/caregiver ability to perform ulcer/skin care regimen upon admission and as needed Assess ulceration(s) every visit Provide education on ulcer and skin care Screen for HBO Treatment Activities: Consult for HBO : 02/26/2023 Skin care regimen initiated : 02/26/2023 Topical wound management initiated : 02/26/2023 Notes: Electronic Signature(s) Signed: 03/26/2023 4:47:30 PM By: Shawn Stall RN, BSN Entered By: Shawn Stall on 03/26/2023 12:54:40 -------------------------------------------------------------------------------- Pain  Assessment Details Patient Name: Date of Service: [name], [name] 03/26/2023 12:30 PM Medical Record Number: [id number] Patient Account Number: [id number] Date of Birth/Sex: Treating RN: February 27, 1946 (77 y.o. M) Primary Care Laneka Mcgrory: [name] Other Clinician: Referring Najat Olazabal: Treating Levern Pitter/Extender: Kathaleen Grinder in Treatment: 4 Active Problems Location of Pain Severity and Description of Pain Patient Has Paino No BOGART, WINZELER ([id number]) [phone].pdf Page 6 of 10 Patient Has Paino No Site Locations Pain Management and Medication Current Pain Management: Electronic Signature(s) Signed: 03/29/2023 12:52:25 PM By: Thayer Dallas Entered By: Thayer Dallas on 03/26/2023 12:42:10 -------------------------------------------------------------------------------- Patient/Caregiver Education Details Patient Name: Date of Service: Apolinar Junes, [name] 11/5/2024andnbsp12:30 PM Medical Record Number: [id number] Patient Account Number: [id number] Date of Birth/Gender: Treating RN: [birth year] (77 y.o. [name] Primary Care Physician: [name] Other Clinician: Referring Physician: Treating Physician/Extender: Kathaleen Grinder in Treatment: 4 Education Assessment Education Provided To: Patient Education Topics Provided Wound/Skin Impairment: Handouts: Caring for Your Ulcer Methods: Explain/Verbal Responses: Reinforcements needed Electronic Signature(s) Signed: 03/26/2023 4:47:30 PM By: Shawn Stall RN, BSN Entered By: Shawn Stall on 03/26/2023 12:54:50 --------------------------------------------------------------------------------  Wound Assessment Details Patient Name: Date of Service: [name], [name] 03/26/2023 12:30 PM Medical Record Number: [id number] Patient Account Number: [id number] KINTE, GRIEVES ([id number]) [phone].pdf  Page 7 of 10 Date of Birth/Sex: Treating RN: 1946-02-26 (77 y.o. M) Primary Care Kyle Luppino: Other Clinician: [name] Referring Kaedin Hicklin: Treating Shareef Eddinger/Extender: Kathaleen Grinder in Treatment: 4 Wound Status Wound Number: 1 Primary Diabetic Wound/Ulcer of the Lower Extremity Etiology: Wound Location: Left, Posterior Calcaneus Wound Open Wounding Event: Blister Status: Date Acquired: 01/20/2023 Comorbid Arrhythmia, Coronary Artery Disease, Hypertension, Peripheral Weeks Of Treatment: 4 History: Venous Disease, Type II Diabetes, Osteoarthritis, Neuropathy Clustered Wound: No Photos Wound Measurements Length: (cm) 1.3 Width: (cm) 0.5 Depth: (cm) 0.3 Area: (cm) 0.511 Volume: (cm) 0.153 % Reduction in Area: 69.9% % Reduction in Volume: 54.9% Epithelialization: Medium (34-66%) Tunneling: No Undermining: No Wound Description Classification: Grade 3 Wound Margin: Distinct, outline attached Exudate Amount: Medium Exudate Type: Serosanguineous Exudate Color: red, brown Foul Odor After Cleansing: No Slough/Fibrino Yes Wound Bed Granulation Amount: Medium (34-66%) Exposed Structure Granulation Quality: Red, Pink Fascia Exposed: No Necrotic Amount: Medium (34-66%) Fat Layer (Subcutaneous Tissue) Exposed: Yes Necrotic Quality: Adherent Slough Tendon Exposed: No Muscle Exposed: No Joint Exposed: No Bone Exposed: No Periwound Skin Texture Texture Color No Abnormalities Noted: Yes No Abnormalities Noted: Yes Moisture No Abnormalities Noted: No Dry / Scaly: Yes Treatment Notes Wound #1 (Calcaneus) Wound Laterality: Left, Posterior Cleanser Wound Cleanser Discharge Instruction: Cleanse the wound with wound cleanser prior to applying a clean dressing using gauze sponges, not tissue or cotton balls. Peri-Wound Care Topical Skintegrity Hydrogel 4 (oz) Discharge Instruction: Apply hydrogel as directed Primary Dressing Cutimed Sorbact  1.5 inx23/8 in KARDIER, FRINK ([id number]) [id number] Page 8 of 10 Discharge Instruction: with Hydrogel into the wound bed Secondary Dressing ALLEVYN Heel 4 1/2in x 5 1/2in / 10.5cm x 13.5cm Discharge Instruction: Apply over primary dressing as directed. Secured With [employer], 4.5x3.1 (in/yd) Discharge Instruction: Secure with Kerlix as directed. 53M Medipore H Soft Cloth Surgical T ape, 4 x 10 (in/yd) Discharge Instruction: Secure with tape as directed. Tubular Netting #5 Discharge Instruction: secure kerlix in place. Compression Wrap Compression Stockings Add-Ons Electronic Signature(s) Signed: 03/29/2023 12:52:25 PM By: Thayer Dallas Entered By: Thayer Dallas on 03/26/2023 12:56:36 -------------------------------------------------------------------------------- Wound Assessment Details Patient Name: Date of Service: [name], [name] 03/26/2023 12:30 PM Medical Record Number: [id number] Patient Account Number: [id number] Date of Birth/Sex: Treating RN: 10-Jan-1946 (77 y.o. M) Primary Care Zaccheus Edmister: [name] Other Clinician: Referring Tenzin Pavon: Treating Kenedi Cilia/Extender: [name] [place] in Treatment: 4 Wound Status Wound Number: 2 Primary Diabetic Wound/Ulcer of the Lower Extremity Etiology: Wound Location: Right, Posterior Calcaneus Wound Open Wounding Event: Blister Status: Date Acquired: 01/20/2023 Comorbid Arrhythmia, Coronary Artery Disease, Hypertension, Peripheral Weeks Of Treatment: 4 History: Venous Disease, Type II Diabetes, Osteoarthritis, Neuropathy Clustered Wound: No Photos Wound Measurements Length: (cm) 0.4 Width: (cm) 0.5 Depth: (cm) 0.1 Area: (cm) 0.157 Volume: (cm) 0.016 % Reduction in Area: 86.1% % Reduction in Volume: 95.3% Epithelialization: Small (1-33%) Tunneling: No Undermining: No Wound Description Classification: Grade 2 Wound Margin: Distinct, outline  attached Exudate Amount: Medium AAYAM, TISCARENO ([id number]) Exudate Type: Serosanguineous Exudate Color: red, brown Foul Odor After Cleansing: No Slough/Fibrino Yes [phone].pdf Page 9 of 10 Wound Bed Granulation Amount: Large (67-100%) Exposed Structure Granulation Quality: Red, Pink Fascia Exposed: No Necrotic Amount: None Present (0%) Fat Layer (Subcutaneous Tissue) Exposed: Yes Tendon Exposed: No Muscle Exposed: No  Joint Exposed: No Bone Exposed: No Periwound Skin Texture Texture Color No Abnormalities Noted: Yes No Abnormalities Noted: Yes Moisture No Abnormalities Noted: No Dry / Scaly: Yes Treatment Notes Wound #2 (Calcaneus) Wound Laterality: Right, Posterior Cleanser Wound Cleanser Discharge Instruction: Cleanse the wound with wound cleanser prior to applying a clean dressing using gauze sponges, not tissue or cotton balls. Peri-Wound Care Topical Skintegrity Hydrogel 4 (oz) Discharge Instruction: Apply hydrogel as directed Primary Dressing Cutimed Sorbact 1.5 inx23/8 in Discharge Instruction: with Hydrogel into the wound bed Secondary Dressing ALLEVYN Heel 4 1/2in x 5 1/2in / 10.5cm x 13.5cm Discharge Instruction: Apply over primary dressing as directed. Secured With [employer], 4.5x3.1 (in/yd) Discharge Instruction: Secure with Kerlix as directed. 35M Medipore H Soft Cloth Surgical T ape, 4 x 10 (in/yd) Discharge Instruction: Secure with tape as directed. Tubular Netting #5 Discharge Instruction: secure kerlix in place. Compression Wrap Compression Stockings Add-Ons Electronic Signature(s) Signed: 03/29/2023 12:52:25 PM By: Thayer Dallas Entered By: Thayer Dallas on 03/26/2023 12:57:17 -------------------------------------------------------------------------------- Vitals Details Patient Name: Date of Service: [name], [name] 03/26/2023 12:30 PM Medical Record Number: [id number] Patient Account Number:  [id number] Date of Birth/Sex: Treating RN: 1945-09-28 (77 y.o. M) Primary Care Trelon Plush: [name] Other Clinician: Referring Neno Hohensee: Treating Kyce Ging/Extender: Vyan, Vanore, Wellington ([id number]) [id number] Page 10 of 10 Weeks in Treatment: 4 Vital Signs Time Taken: 12:41 Reference Range: 80 - 120 mg / dl Height (in): 70 Weight (lbs): 196 Body Mass Index (BMI): 28.1 Electronic Signature(s) Signed: 03/29/2023 12:52:25 PM By: Thayer Dallas Entered By: Thayer Dallas on 03/26/2023 12:42:02

## 2023-03-29 NOTE — Progress Notes (Signed)
ALLANMICHAEL, PREISER ([id number]) [id number] Page 1 of 2 Visit Report for 03/29/2023 HBO Details Patient Name: Date of Service: [name], [name] 03/29/2023 9:30 A M Medical Record Number: [id number] Patient Account Number: [id number] Date of Birth/Sex: Treating RN: Apr 05, 1946 (77 y.o. [name] Primary Care Deaundra Kutzer: [name] Other Clinician: [name] Referring Intisar Claudio: Treating Sheana Bir/Extender: [name] [facility], [place] in Treatment: 4 HBO Treatment Course Details Treatment Course Number: 1 Ordering Wrenley Sayed: [name] T Treatments Ordered: otal 40 HBO Treatment Start Date: 03/11/2023 HBO Indication: Diabetic Ulcer(s) of the Lower Extremity Standard/Conservative Wound Care tried and failed greater than or equal to 30 days Wound #1 Left, Posterior Calcaneus , Wound #2 Right, Posterior Calcaneus HBO Treatment Details Treatment Number: 11 Patient Type: Outpatient Chamber Type: Monoplace Chamber Serial #: [id number] Treatment Protocol: 2.0 ATA with 90 minutes oxygen, and no air breaks Treatment Details Compression Rate Down: 1.5 psi / minute De-Compression Rate Up: 1.5 psi / minute Air breaks and breathing Decompress Decompress Compress Tx Pressure Begins Reached periods Begins Ends (leave unused spaces blank) Chamber Pressure (ATA 1 2 ------2 1 ) Clock Time (24 hr) 10:02 10:13 - - - - - - 11:43 11:53 Treatment Length: 111 (minutes) Treatment Segments: 4 Vital Signs Capillary Blood Glucose Reference Range: 80 - 120 mg / dl HBO Diabetic Blood Glucose Intervention Range: <131 mg/dl or >644 mg/dl Type: Time Vitals Blood Respiratory Capillary Blood Glucose Pulse Action Pulse: Temperature: Taken: Pressure: Rate: Glucose (mg/dl): Meter #: Oximetry (%) Taken: Pre 09:21 116/68 71 18 97.3 199 none per protocol Post 11:56 156/85 66 18 97.3 157 none per protocol Treatment Response Treatment  Toleration: Well Treatment Completion Status: Treatment Completed without Adverse Event Treatment Notes Mr. Galante arrived with normal vital signs. He stated that he ate breakfast. Patient self-administered Afrin. After performing a safety check, the patient was placed in the chamber which was compressed with 100% oxygen at a rate set of 2 psi/min gradually increased after confirming normal ear equalization. Mr. Blend tolerated the treatment and subsequent decompression at the rate of 2 psi/min. He denied any issues with ear equalization and/or pain associated with barotrauma. Post-treatment vitals were within normal range. He was stable upon discharge with his wife. Additional Procedure Documentation Tissue Sevierity: Fat layer exposed Physician HBO Attestation: I certify that I supervised this HBO treatment in accordance with Medicare guidelines. A trained emergency response team is readily available per Yes hospital policies and procedures. Continue HBOT as ordered. [address] KRISTIEN, MEHAFFEY ([id number]) [id number] Page 2 of 2 Electronic Signature(s) Signed: 04/01/2023 7:41:59 AM By: [name] MD FACS Previous Signature: 03/29/2023 1:08:44 PM Version By: [name] CHT EMT BS , , Entered By: [name] on 04/01/2023 04:41:59 -------------------------------------------------------------------------------- HBO Safety Checklist Details Patient Name: Date of Service: [name], [name] 03/29/2023 9:30 A M Medical Record Number: [id number] Patient Account Number: [id number] Date of Birth/Sex: Treating RN: 04-18-1946 (77 y.o. [name] Primary Care Khan Chura: [name] Other Clinician: [name] Referring Elster Corbello: Treating Andren Bethea/Extender: [name] [place] in Treatment: 4 HBO Safety Checklist Items Safety Checklist Consent Form Signed Patient voided / foley secured and emptied When did you  last eato 0820 Tomasa Blase Egg Hashbrown Last dose of injectable or oral agent 0800 - Metformin Ostomy pouch emptied and vented if applicable NA All implantable devices assessed, documented and approved NA Intravenous access site secured and place NA Valuables secured Linens and cotton and cotton/polyester blend (less than 51% polyester)  Personal oil-based products / skin lotions / body lotions removed Wigs or hairpieces removed NA Smoking or tobacco materials removed NA Books / newspapers / magazines / loose paper removed Cologne, aftershave, perfume and deodorant removed Jewelry removed (may wrap wedding band) Make-up removed Hair care products removed Battery operated devices (external) removed Heating patches and chemical warmers removed Titanium eyewear removed Nail polish cured greater than 10 hours NA Casting material cured greater than 10 hours NA Hearing aids removed Loose dentures or partials removed Prosthetics have been removed NA Patient demonstrates correct use of air break device (if applicable) Patient concerns have been addressed Patient grounding bracelet on and cord attached to chamber Specifics for Inpatients (complete in addition to above) Medication sheet sent with patient NA Intravenous medications needed or due during therapy sent with patient NA Drainage tubes (e.g. nasogastric tube or chest tube secured and vented) NA Endotracheal or Tracheotomy tube secured NA Cuff deflated of air and inflated with saline NA Airway suctioned NA Notes Paper version used prior to treatment start. Electronic Signature(s) Signed: 03/29/2023 1:04:01 PM By: [name] CHT EMT BS , , Entered By: [name] on 03/29/2023 10:04:01

## 2023-04-01 ENCOUNTER — Encounter (HOSPITAL_BASED_OUTPATIENT_CLINIC_OR_DEPARTMENT_OTHER): Payer: No Typology Code available for payment source | Admitting: Internal Medicine

## 2023-04-01 DIAGNOSIS — E11621 Type 2 diabetes mellitus with foot ulcer: Secondary | ICD-10-CM | POA: Diagnosis not present

## 2023-04-01 LAB — GLUCOSE, CAPILLARY
Glucose-Capillary: 231 mg/dL — ABNORMAL HIGH (ref 70–99)
Glucose-Capillary: 178 mg/dL — ABNORMAL HIGH (ref 70–99)

## 2023-04-01 NOTE — Progress Notes (Addendum)
VIRAAJ, BIEHL ([id number]) [id number] Page 1 of 2 Visit Report for 04/01/2023 Arrival Information Details Patient Name: Date of Service: [name], [name] 04/01/2023 10:00 [name] Medical Record Number: [id number] Patient Account Number: [id number] Date of Birth/Sex: Treating RN: [birth year] (77 y.o. [name] [name]: [name], [name]: [name] Referring [name]: Treating [name]/Extender: [name], [name] in Treatment: 4 Visit Information History Since Last Visit All ordered tests and consults were completed: Yes Patient Arrived: [name] Added or deleted any medications: No Arrival Time: 08:55 Any new allergies or adverse reactions: No Accompanied By: Wife Had Billy fall or experienced change in No Transfer Assistance: None activities of daily living that may affect Patient Identification Verified: Yes risk of falls: Secondary Verification Process Completed: Yes Signs or symptoms of abuse/neglect since last visito No Patient Requires Transmission-Based Precautions: No Hospitalized since last visit: No Patient Has Alerts: No Implantable device outside of the [name] excluding No cellular tissue based products placed in the center since last visit: Pain Present Now: No Electronic Signature(s) Signed: 04/01/2023 11:32:49 AM By: [name] EMT Entered By: [name] on 04/01/2023 11:32:49 -------------------------------------------------------------------------------- Encounter Discharge Information Details Patient Name: Date of Service: [name], [name] 04/01/2023 10:00 [name] Medical Record Number: [id number] Patient Account Number: [id number] Date of Birth/Sex: Treating RN: 04-30-1946 (77 y.o. [name] [name]: [name], [name]: [name] Referring [name]: Treating [name]/Extender: [name],  [name] in Treatment: 4 Encounter Discharge Information Items Discharge Condition: Stable Ambulatory Status: Walker Discharge Destination: Home Transportation: Private Auto Accompanied By: Wife Schedule Follow-up Appointment: Yes Clinical Summary of [name]: Electronic Signature(s) Signed: 04/01/2023 12:21:36 PM By: [name] EMT Entered By: [name] on 04/01/2023 12:21:36 [name] ([id number]) [id number] Page 2 of 2 -------------------------------------------------------------------------------- Vitals Details Patient Name: Date of Service: [name], [name] 04/01/2023 10:00 [name] Medical Record Number: [id number] Patient Account Number: [id number] Date of Birth/Sex: Treating RN: 03-31-1946 (77 y.o. [name] [name]: [name], [name]: [name] Referring [name]: Treating [name]/Extender: [name], [name] in Treatment: 4 Vital Signs Time Taken: 09:00 Temperature (F): 98.8 Height (in): 70 Pulse (bpm): 71 Weight (lbs): 196 Respiratory Rate (breaths/min): 18 Body Mass Index (BMI): 28.1 Blood Pressure (mmHg): 135/87 Capillary Blood Glucose (mg/dl): 601 Reference Range: 80 - 120 mg / dl Electronic Signature(s) Signed: 04/01/2023 11:33:43 AM By: [name] EMT Entered By: [name] on 04/01/2023 11:33:43

## 2023-04-01 NOTE — Progress Notes (Addendum)
[name], [name] ([id number]) [id number] Page 1 of 2 Visit Report for 04/01/2023 HBO Details Patient Name: Date of Service: [name], [name] 04/01/2023 10:00 Billy M Medical Record Number: [id number] Patient Account Number: [id number] Date of Birth/Sex: Treating RN: June 22, 1945 (77 y.o. [name] Primary Care Keidan Aumiller: [facility], [place] Other Clinician: Karl Bales Referring Rillie Riffel: Treating Taraann Olthoff/Extender: [name] [facility] [place] in Treatment: 4 HBO Treatment Course Details Treatment Course Number: 1 Ordering Gema Ringold: [name] T Treatments Ordered: otal 40 HBO Treatment Start Date: 03/11/2023 HBO Indication: Diabetic Ulcer(s) of the Lower Extremity Standard/Conservative Wound Care tried and failed greater than or equal to 30 days Wound #1 Left, Posterior Calcaneus , Wound #2 Right, Posterior Calcaneus HBO Treatment Details Treatment Number: 12 Patient Type: Outpatient Chamber Type: Monoplace Chamber Serial #: [id number] Treatment Protocol: 2.0 ATA with 90 minutes oxygen, and no air breaks Treatment Details Compression Rate Down: 1.5 psi / minute De-Compression Rate Up: 2.0 psi / minute Air breaks and breathing Decompress Decompress Compress Tx Pressure Begins Reached periods Begins Ends (leave unused spaces blank) Chamber Pressure (ATA 1 2 ------2 1 ) Clock Time (24 hr) 09:29 09:38 - - - - - - 11:09 11:20 Treatment Length: 111 (minutes) Treatment Segments: 4 Vital Signs Capillary Blood Glucose Reference Range: 80 - 120 mg / dl HBO Diabetic Blood Glucose Intervention Range: <131 mg/dl or >782 mg/dl Time Vitals Blood Respiratory Capillary Blood Glucose Pulse Action Type: Pulse: Temperature: Taken: Pressure: Rate: Glucose (mg/dl): Meter #: Oximetry (%) Taken: Pre 09:00 135/87 71 18 98.8 231 Post 11:26 147/80 67 18 98.4 178 Treatment Response Treatment Toleration: Well Treatment Completion  Status: Treatment Completed without Adverse Event Additional Procedure Documentation Tissue [name] Yahmir Sokolov Notes No concerns with treatment given.. Patient was also seen for wound care evaluation Physician HBO Attestation: I certify that I supervised this HBO treatment in accordance with Medicare guidelines. Billy trained emergency response team is readily available per Yes hospital policies and procedures. Continue HBOT as ordered. [address] MICHAELANGELO, ECCHER ([id number]) [id number] Page 2 of 2 Signed: 04/01/2023 4:37:17 PM By: [name] MD Previous Signature: 04/01/2023 12:08:35 PM Version By: Karl Bales EMT Previous Signature: 04/01/2023 12:08:10 PM Version By: Karl Bales EMT Entered By: [name] on 04/01/2023 13:36:01 -------------------------------------------------------------------------------- HBO Safety Checklist Details Patient Name: Date of Service: [name], [name] 04/01/2023 10:00 Billy M Medical Record Number: [id number] Patient Account Number: [id number] Date of Birth/Sex: Treating RN: [birth year] (77 y.o. [name] Primary Care Josy Peaden: [name] Other Clinician: Karl Bales Referring Jeanclaude Wentworth: Treating Kameran Lallier/Extender: [name] [facility] [place] in Treatment: 4 HBO Safety Checklist Items Safety Checklist Consent Form Signed Patient voided / foley secured and emptied When did you last eato 0815 Last dose of injectable or oral agent 0515 Ostomy pouch emptied and vented if applicable NA All implantable devices assessed, documented and approved NA Intravenous access site secured and place NA Valuables secured Linens and cotton and cotton/polyester blend (less than 51% polyester) Personal oil-based products / skin lotions / body lotions removed Wigs or hairpieces removed NA Smoking or tobacco materials removed Books / newspapers / magazines /  loose paper removed Cologne, aftershave, perfume and deodorant removed Jewelry removed (may wrap wedding band) Make-up removed NA Hair care products removed Battery operated devices (external) removed Heating patches and chemical warmers removed Titanium eyewear removed NA Nail polish cured greater than 10 hours NA Casting material cured greater than 10 hours NA Hearing aids  removed Left at home Loose dentures or partials removed Left at home Prosthetics have been removed NA Patient demonstrates correct use of air break device (if applicable) Patient concerns have been addressed Patient grounding bracelet on and cord attached to chamber Specifics for Inpatients (complete in addition to above) Medication sheet sent with patient NA Intravenous medications needed or due during therapy sent with patient NA Drainage tubes (e.g. nasogastric tube or chest tube secured and vented) NA Endotracheal or Tracheotomy tube secured NA Cuff deflated of air and inflated with saline NA Airway suctioned NA Notes The safety checklist was done before the treatment was started. Electronic Signature(s) Signed: 04/01/2023 11:40:27 AM By: Karl Bales EMT Entered By: Karl Bales on 04/01/2023 08:40:27

## 2023-04-01 NOTE — Progress Notes (Addendum)
JESEAN, WICHERT ([id number]) [id number] Page 1 of 2 Visit Report for 04/01/2023 Problem List Details Patient Name: Date of Service: [name], [name] 04/01/2023 10:00 A M Medical Record Number: [id number] Patient Account Number: [id number] Date of Birth/Sex: Treating RN: [birth year] (77 y.o. 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 04/01/2023 Medical Record Number: [id number] Patient Account Number: [id number] Date of Birth/Sex: Treating RN: [birth year] (77 y.o. 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 E11.42 Type 2 diabetes mellitus with diabetic polyneuropathy E11.51 Type 2 diabetes mellitus with diabetic peripheral angiopathy without gangrene Facility Procedures : CPT4 Code Description: [id number]-(Facility Use Only) HBOT full body chamber, , ICD-10 Diagnosis Description 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 Modifier: her specified se er specified sev Quantity: 4 verity erity Physician Procedures : CPT4 Code Description Modifier [id number] [phone] - WC PHYS HYPERBARIC OXYGEN THERAPY ICD-10 Diagnosis Description 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

## 2023-04-01 NOTE — Progress Notes (Signed)
CABOT, WINDHORST ([id number]) [id number] Page 1 of 9 Visit Report for 04/01/2023 Arrival Information Details Patient Name: Date of Service: [name], [name] 04/01/2023 12:30 PM Medical Record Number: [id number] Patient Account Number: [id number] Date of Birth/Sex: Treating RN: 05/13/1946 (77 y.o. [name] Primary Care Ahmon Tosi: [name] Other Clinician: Referring Geriann Lafont: Treating Zainah Steven/Extender: [name] [place] in Treatment: 4 Visit Information History Since Last Visit Added or deleted any medications: No Patient Arrived: [name] Any new allergies or adverse reactions: No Arrival Time: 12:38 Had a fall or experienced change in No Accompanied By: wife activities of daily living that may affect Transfer Assistance: Manual risk of falls: Patient Identification Verified: Yes Signs or symptoms of abuse/neglect since last visito No Secondary Verification Process Completed: Yes Hospitalized since last visit: No Patient Requires Transmission-Based Precautions: No Implantable device outside of the clinic excluding No Patient Has Alerts: No cellular tissue based products placed in the center since last visit: Has Dressing in Place as Prescribed: Yes Has Footwear/Offloading in Place as Prescribed: Yes Left: Wedge Shoe Right: Wedge Shoe Pain Present Now: No Electronic Signature(s) Signed: 04/01/2023 4:28:03 PM By: Shawn Stall RN, BSN Entered By: Shawn Stall on 04/01/2023 09:39:07 -------------------------------------------------------------------------------- Encounter Discharge Information Details Patient Name: Date of Service: [name], [name] 04/01/2023 12:30 PM Medical Record Number: [id number] Patient Account Number: [id number] Date of Birth/Sex: Treating RN: 1946-01-27 (77 y.o. [name] Primary Care Holbert Caples: [name] Other Clinician: Referring Erinn Huskins: Treating  Domique Reardon/Extender: [facility] Grinder in Treatment: 4 Encounter Discharge Information Items Post Procedure Vitals Discharge Condition: Stable Unable to obtain vitals Reason: see post vital signs Ambulatory Status: Ambulatory Discharge Destination: Home Transportation: Private Auto Accompanied By: wife Schedule Follow-up Appointment: Yes Clinical Summary of Care: Electronic Signature(s) Signed: 04/01/2023 4:28:03 PM By: Shawn Stall RN, BSN Entered By: Shawn Stall on 04/01/2023 09:54:22 [name] ([id number]) [id number] Page 2 of 9 -------------------------------------------------------------------------------- Lower Extremity Assessment Details Patient Name: Date of Service: [name], [name] 04/01/2023 12:30 PM Medical Record Number: [id number] Patient Account Number: [id number] Date of Birth/Sex: Treating RN: 1945/11/23 (77 y.o. [name] Primary Care Saed Hudlow: [name] Other Clinician: Referring Westyn Driggers: Treating Arif Amendola/Extender: [name] [place] in Treatment: 4 Edema Assessment Assessed: [Left: Yes] [Right: Yes] Edema: [Left: Yes] [Right: Yes] Calf Left: Right: Point of Measurement: From Medial Instep 32 cm 34 cm Ankle Left: Right: Point of Measurement: From Medial Instep 21 cm 21.5 cm Vascular Assessment Pulses: Dorsalis Pedis Palpable: [Left:Yes] [Right:Yes] Extremity colors, hair growth, and conditions: Extremity Color: [Left:Normal] [Right:Normal] Hair Growth on Extremity: [Left:No] [Right:No] Temperature of Extremity: [Left:Warm] [Right:Warm] Capillary Refill: [Left:< 3 seconds] [Right:< 3 seconds] Dependent Rubor: [Left:No] [Right:No] Blanched when Elevated: [Left:No No] [Right:No No] Toe Nail Assessment Left: Right: Thick: No No Discolored: No No Deformed: No No Improper Length and Hygiene: No No Electronic Signature(s) Signed: 04/01/2023  4:28:03 PM By: Shawn Stall RN, BSN Entered By: Shawn Stall on 04/01/2023 09:42:25 -------------------------------------------------------------------------------- Multi Wound Chart Details Patient Name: Date of Service: [name], [name] 04/01/2023 12:30 PM Medical Record Number: [id number] Patient Account Number: [id number] Date of Birth/Sex: Treating RN: 04/21/1946 (77 y.o. M) Primary Care Pahola Dimmitt: [name] Other Clinician: Referring Rogenia Werntz: Treating Taos Tapp/Extender: Kathaleen Grinder in Treatment: 4 [1:Photos:] [name], [name] ([id number]) [1:Photos:] [N/A:[id number] Page 3 of 9 N/A] Left, Posterior Calcaneus Right, Posterior Calcaneus N/A Wound Location: Blister Blister N/A Wounding Event: Diabetic Wound/Ulcer of  the Lower Diabetic Wound/Ulcer of the Lower N/A Primary Etiology: Extremity Extremity Arrhythmia, Coronary Artery Disease, Arrhythmia, Coronary Artery Disease, N/A Comorbid History: Hypertension, Peripheral Venous Hypertension, Peripheral Venous Disease, Type II Diabetes, Disease, Type II Diabetes, Osteoarthritis, Neuropathy Osteoarthritis, Neuropathy 01/20/2023 01/20/2023 N/A Date Acquired: 4 4 N/A Weeks of Treatment: Open Open N/A Wound Status: No No N/A Wound Recurrence: 1.2x0.5x0.5 0x0x0 N/A Measurements L x W x D (cm) 0.471 0 N/A A (cm) : rea 0.236 0 N/A Volume (cm) : 72.20% 100.00% N/A % Reduction in A rea: 30.40% 100.00% N/A % Reduction in Volume: Grade 3 Grade 2 N/A Classification: Medium None Present N/A Exudate A mount: Serosanguineous N/A N/A Exudate Type: red, brown N/A N/A Exudate Color: Distinct, outline attached Distinct, outline attached N/A Wound Margin: Small (1-33%) None Present (0%) N/A Granulation A mount: Red, Pink N/A N/A Granulation Quality: Large (67-100%) None Present (0%) N/A Necrotic A mount: Fat Layer (Subcutaneous Tissue): Yes Fascia: No  N/A Exposed Structures: Fascia: No Fat Layer (Subcutaneous Tissue): No Tendon: No Tendon: No Muscle: No Muscle: No Joint: No Joint: No Bone: No Bone: No Medium (34-66%) Large (67-100%) N/A Epithelialization: Debridement - Excisional N/A N/A Debridement: Pre-procedure Verification/Time Out 12:45 N/A N/A Taken: Lidocaine 4% Topical Solution N/A N/A Pain Control: Subcutaneous, Slough N/A N/A Tissue Debrided: Skin/Subcutaneous Tissue N/A N/A Level: 0.47 N/A N/A Debridement A (sq cm): rea Curette N/A N/A Instrument: Minimum N/A N/A Bleeding: Pressure N/A N/A Hemostasis A chieved: 0 N/A N/A Procedural Pain: 0 N/A N/A Post Procedural Pain: Procedure was tolerated well N/A N/A Debridement Treatment Response: 1.2x0.5x0.5 N/A N/A Post Debridement Measurements L x W x D (cm) 0.236 N/A N/A Post Debridement Volume: (cm) No Abnormalities Noted No Abnormalities Noted N/A Periwound Skin Texture: Dry/Scaly: Yes Dry/Scaly: Yes N/A Periwound Skin Moisture: No Abnormalities Noted No Abnormalities Noted N/A Periwound Skin Color: Debridement N/A N/A Procedures Performed: Treatment Notes Wound #1 (Calcaneus) Wound Laterality: Left, Posterior Cleanser Wound Cleanser Discharge Instruction: Cleanse the wound with wound cleanser prior to applying a clean dressing using gauze sponges, not tissue or cotton balls. Peri-Wound Care Topical Skintegrity Hydrogel 4 (oz) Discharge Instruction: Apply hydrogel as directed Primary Dressing Cutimed Sorbact 1.5 inx23/8 in Discharge Instruction: with Hydrogel into the wound bed Secondary Dressing ALLEVYN Heel 4 1/2in x 5 1/2in / 10.5cm x 13.5cm [name], [name] ([id number]) [id number] Page 4 of 9 Discharge Instruction: Apply over primary dressing as directed. Secured With [employer], 4.5x3.1 (in/yd) Discharge Instruction: Secure with Kerlix as directed. 63M Medipore H Soft Cloth Surgical T ape, 4 x 10  (in/yd) Discharge Instruction: Secure with tape as directed. Compression Wrap Compression Stockings Add-Ons Notes bordered foam for protection to right heel. Wound #2 (Calcaneus) Wound Laterality: Right, Posterior Cleanser Peri-Wound Care Topical Primary Dressing Secondary Dressing Secured With Compression Wrap Compression Stockings Add-Ons Notes bordered foam for protection to right heel. Electronic Signature(s) Signed: 04/01/2023 4:37:17 PM By: [name] MD Entered By: [name] on 04/01/2023 09:56:25 -------------------------------------------------------------------------------- Multi-Disciplinary Care Plan Details Patient Name: Date of Service: [name], [name] 04/01/2023 12:30 PM Medical Record Number: [id number] Patient Account Number: [id number] Date of Birth/Sex: Treating RN: 1945-08-29 (77 y.o. [name] Primary Care Zunaira Lamy: [name] Other Clinician: Referring Jermel Artley: Treating Jevaeh Shams/Extender: [name] [place] in Treatment: 4 Active Inactive Wound/Skin Impairment Nursing Diagnoses: Knowledge deficit related to smoking impact on wound healing Goals: Patient/caregiver will verbalize understanding of skin care regimen Date Initiated: 02/26/2023 Target Resolution Date: 05/17/2023 Goal Status: Active  Interventions: Assess patient/caregiver ability to obtain necessary supplies JERIMEY, WEIBLEY ([id number]) [phone].pdf Page 5 of 9 Assess patient/caregiver ability to perform ulcer/skin care regimen upon admission and as needed Assess ulceration(s) every visit Provide education on ulcer and skin care Screen for HBO Treatment Activities: Consult for [facility] : 02/26/2023 Skin care regimen initiated : 02/26/2023 Topical wound management initiated : 02/26/2023 Notes: Electronic Signature(s) Signed: 04/01/2023 4:28:03 PM By: Shawn Stall RN, BSN Entered By: Shawn Stall on  04/01/2023 09:45:50 -------------------------------------------------------------------------------- Pain Assessment Details Patient Name: Date of Service: [name], [name] 04/01/2023 12:30 PM Medical Record Number: [id number] Patient Account Number: [id number] Date of Birth/Sex: Treating RN: [birth year] (77 y.o. [name] Primary Care Charmika Macdonnell: [name] Other Clinician: Referring Trea Latner: Treating Maxyne Derocher/Extender: [name] [place] in Treatment: 4 Active Problems Location of Pain Severity and Description of Pain Patient Has Paino No Site Locations Pain Management and Medication Current Pain Management: Electronic Signature(s) Signed: 04/01/2023 4:28:03 PM By: Shawn Stall RN, BSN Entered By: Shawn Stall on 04/01/2023 09:39:32 Patient/Caregiver Education Details -------------------------------------------------------------------------------- [name] ([id number]) [id number] Page 6 of 9 Patient Name: Date of Service: [name], [name] 11/11/2024andnbsp12:30 PM Medical Record Number: [id number] Patient Account Number: [id number] Date of Birth/Gender: Treating RN: 16-Oct-1945 (77 y.o. [name] Primary Care Physician: [name] Other Clinician: Referring Physician: Treating Physician/Extender: [name] [place] in Treatment: 4 Education Assessment Education Provided To: Patient Education Topics Provided Wound/Skin Impairment: Handouts: Caring for Your Ulcer Methods: Explain/Verbal Responses: Reinforcements needed Electronic Signature(s) Signed: 04/01/2023 4:28:03 PM By: Shawn Stall RN, BSN Entered By: Shawn Stall on 04/01/2023 09:46:03 -------------------------------------------------------------------------------- Wound Assessment Details Patient Name: Date of Service: [name], [name] 04/01/2023 12:30 PM Medical Record Number:  [id number] Patient Account Number: [id number] Date of Birth/Sex: Treating RN: 1946/03/18 (77 y.o. [name] Primary Care Afton Mikelson: [name] Other Clinician: Referring Betania Dizon: Treating Willam Munford/Extender: [name] [place] in Treatment: 4 Wound Status Wound Number: 1 Primary Diabetic Wound/Ulcer of the Lower Extremity Etiology: Wound Location: Left, Posterior Calcaneus Wound Open Wounding Event: Blister Status: Date Acquired: 01/20/2023 Comorbid Arrhythmia, Coronary Artery Disease, Hypertension, Peripheral Weeks Of Treatment: 4 History: Venous Disease, Type II Diabetes, Osteoarthritis, Neuropathy Clustered Wound: No Photos Wound Measurements Length: (cm) 1.2 Width: (cm) 0.5 Depth: (cm) 0.5 Area: (cm) 0.471 Volume: (cm) 0.236 % Reduction in Area: 72.2% % Reduction in Volume: 30.4% Epithelialization: Medium (34-66%) Tunneling: No Undermining: No Wound Description Classification: Grade 3 Wound Margin: Distinct, outline attached [name], [name] ([id number]) Exudate Amount: Medium Exudate Type: Serosanguineous Exudate Color: red, brown Foul Odor After Cleansing: No Slough/Fibrino Yes [phone].pdf Page 7 of 9 Wound Bed Granulation Amount: Small (1-33%) Exposed Structure Granulation Quality: Red, Pink Fascia Exposed: No Necrotic Amount: Large (67-100%) Fat Layer (Subcutaneous Tissue) Exposed: Yes Necrotic Quality: Adherent Slough Tendon Exposed: No Muscle Exposed: No Joint Exposed: No Bone Exposed: No Periwound Skin Texture Texture Color No Abnormalities Noted: Yes No Abnormalities Noted: Yes Moisture No Abnormalities Noted: No Dry / Scaly: Yes Treatment Notes Wound #1 (Calcaneus) Wound Laterality: Left, Posterior Cleanser Wound Cleanser Discharge Instruction: Cleanse the wound with wound cleanser prior to applying a clean dressing using gauze sponges, not tissue or cotton balls. Peri-Wound  Care Topical Skintegrity Hydrogel 4 (oz) Discharge Instruction: Apply hydrogel as directed Primary Dressing Cutimed Sorbact 1.5 inx23/8 in Discharge Instruction: with Hydrogel into the wound bed Secondary Dressing ALLEVYN Heel 4 1/2in x 5 1/2in / 10.5cm x 13.5cm Discharge  Instruction: Apply over primary dressing as directed. Secured With [employer], 4.5x3.1 (in/yd) Discharge Instruction: Secure with Kerlix as directed. 35M Medipore H Soft Cloth Surgical T ape, 4 x 10 (in/yd) Discharge Instruction: Secure with tape as directed. Compression Wrap Compression Stockings Add-Ons Notes bordered foam for protection to right heel. Electronic Signature(s) Signed: 04/01/2023 4:28:03 PM By: Shawn Stall RN, BSN Entered By: Shawn Stall on 04/01/2023 09:45:10 -------------------------------------------------------------------------------- Wound Assessment Details Patient Name: Date of Service: [name], [name] 04/01/2023 12:30 PM Medical Record Number: [id number] Patient Account Number: [id number] Date of Birth/Sex: Treating RN: 1946/04/04 (77 y.o. [name] Primary Care Aylen Stradford: [facility], [place] Other Clinician: PHILIPE, [name] ([id number]) [id number] Page 8 of 9 Referring Hargun Spurling: Treating Hanzel Pizzo/Extender: [name] [place] in Treatment: 4 Wound Status Wound Number: 2 Primary Diabetic Wound/Ulcer of the Lower Extremity Etiology: Wound Location: Right, Posterior Calcaneus Wound Open Wounding Event: Blister Status: Date Acquired: 01/20/2023 Comorbid Arrhythmia, Coronary Artery Disease, Hypertension, Peripheral Weeks Of Treatment: 4 History: Venous Disease, Type II Diabetes, Osteoarthritis, Neuropathy Clustered Wound: No Photos Wound Measurements Length: (cm) Width: (cm) Depth: (cm) Area: (cm) Volume: (cm) 0 % Reduction in Area: 100% 0 % Reduction in Volume: 100% 0 Epithelialization: Large  (67-100%) 0 Tunneling: No 0 Undermining: No Wound Description Classification: Grade 2 Wound Margin: Distinct, outline attached Exudate Amount: None Present Foul Odor After Cleansing: No Slough/Fibrino No Wound Bed Granulation Amount: None Present (0%) Exposed Structure Necrotic Amount: None Present (0%) Fascia Exposed: No Fat Layer (Subcutaneous Tissue) Exposed: No Tendon Exposed: No Muscle Exposed: No Joint Exposed: No Bone Exposed: No Periwound Skin Texture Texture Color No Abnormalities Noted: Yes No Abnormalities Noted: Yes Moisture No Abnormalities Noted: No Dry / Scaly: Yes Electronic Signature(s) Signed: 04/01/2023 4:28:03 PM By: Shawn Stall RN, BSN Entered By: Shawn Stall on 04/01/2023 09:45:41 -------------------------------------------------------------------------------- Vitals Details Patient Name: Date of Service: [name], [name] 04/01/2023 12:30 PM Medical Record Number: [id number] Patient Account Number: [id number] Date of Birth/Sex: Treating RN: 1946-03-26 (77 y.o. [name] Primary Care Blake Goya: [name] Other Clinician: Referring Sheralee Qazi: Treating Monicka Cyran/Extender: Kathaleen Grinder in Treatment: [address] ([id number]) [id number] Page 9 of 9 Vital Signs Time Taken: 12:39 Reference Range: 80 - 120 mg / dl Height (in): 70 Weight (lbs): 196 Body Mass Index (BMI): 28.1 Notes see vital signs from [facility]. Electronic Signature(s) Signed: 04/01/2023 4:28:03 PM By: Shawn Stall RN, BSN Entered By: Shawn Stall on 04/01/2023 09:39:27

## 2023-04-01 NOTE — Progress Notes (Signed)
AHMI, [name] ([id number]) [id number] Page 1 of 8 Visit Report for 04/01/2023 Debridement Details Patient Name: Date of Service: [name], [name] 04/01/2023 12:30 PM Medical Record Number: [id number] Patient Account Number: [id number] Date of Birth/Sex: Treating RN: 03-Nov-1945 (77 y.o. M) Primary Care Provider: [name] Other Clinician: Referring Provider: Treating Provider/Extender: [name] [facility], [place] [facility] in Treatment: 4 Debridement Performed for Assessment: Wound #1 Left,Posterior Calcaneus Performed By: Physician Maxwell Caul., MD The following information was scribed by: Shawn Stall The information was scribed for: [name] Debridement Type: Debridement Severity of Tissue Pre Debridement: Fat layer exposed Level of Consciousness (Pre-procedure): Awake and Alert Pre-procedure Verification/Time Out Yes - 12:45 Taken: Start Time: 12:46 Pain Control: Lidocaine 4% T opical Solution Percent of Wound Bed Debrided: 100% T Area Debrided (cm): otal 0.47 Tissue and other material debrided: Viable, Non-Viable, Slough, Subcutaneous, Slough Level: Skin/Subcutaneous Tissue Debridement Description: Excisional Instrument: Curette Bleeding: Minimum Hemostasis Achieved: Pressure End Time: 12:52 Procedural Pain: 0 Post Procedural Pain: 0 Response to Treatment: Procedure was tolerated well Level of Consciousness (Post- Awake and Alert procedure): Post Debridement Measurements of Total Wound Length: (cm) 1.2 Width: (cm) 0.5 Depth: (cm) 0.5 Volume: (cm) 0.236 Character of Wound/Ulcer Post Debridement: Improved Severity of Tissue Post Debridement: Fat layer exposed Post Procedure Diagnosis Same as Pre-procedure Electronic Signature(s) Signed: 04/01/2023 4:37:17 PM By: [name] MD Entered By: [name] on 04/01/2023  09:56:36 -------------------------------------------------------------------------------- HPI Details Patient Name: Date of Service: [name], [name] 04/01/2023 12:30 PM Medical Record Number: [id number] Patient Account Number: [id number] Date of Birth/Sex: Treating RN: 1946/02/12 (77 y.o. M) Primary Care Provider: [name] Other Clinician: Referring Provider: Treating Provider/Extender: Kathaleen Grinder in Treatment: 4 [name], [name] ([id number]) [id number] Page 2 of 8 History of Present Illness HPI Description: ADMISSION 02/26/2023 Patient is Billy 77 year old man with type 2 diabetes. He has had Billy 64-month history of wounds on his bilateral heels just above the heel tips. They have been using Iodoflex/Iodosorb ointment. The patient has Billy history of PAD and has Billy SFA stent on the right. He also also status post left iliac stent. Furthermore he has Billy history of carotid artery stenosis status post right carotid endarterectomy and Billy left subclavian artery stenosis. His wound started on his blisters on the heel that of open to wounds with minimal but some depth right greater than left. He also has advanced diabetic neuropathy. The patient is cared for through the [place] and has been referred here for hyperbaric oxygen treatment through his vascular surgeon. Past medical history includes hypertension, hyperlipidemia, type 2 diabetes chronic kidney disease stage IIIb, carotid artery stenosis status post right carotid endarterectomy, left subclavian artery stenosis ABIs done in our clinic were 1.11 on the right and 0.75 on the left these were remarkably similar to what was quoted during the vascular surgery appointment from 02/05/2023. 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 He was referred for hyperbaric oxygen by the [place]. His chest x-ray I think shows vascular crowding but no  pneumothorax. He has been using Iodoflex on the wound with reasonably good improvement this week. He sees his ENT in 2 days and then they will make Billy decision about when they want to start HBO 10/24; patient is started HBO I believe he is on treatment 5 today. He had some problems with his right ear on the second dive however he has  had no problems since and says he feels well. He slept through treatment yesterday. He has been using Iodosorb ointment on the wounds on both heels for Billy long period even before he came here. Unfortunately the wound surface still does not look completely as viable as I would like. 11/5; he continues to tolerate HBO fairly well although he is having some fullness in his left ear that is reversed by popping his left ear. He has been using Sorbact hydrogel. He comes in today with the wounds quite Billy bit smaller especially on the right but also on the left. He is fairly rigorous and offloading these with heel offloading shoes. 11/11; right posterior calcaneus is closed down. The area on the left calcaneus laterally still has some depth. We have been using Sorbact on both sides. He has completed 12 out of 40 treatments Electronic Signature(s) Signed: 04/01/2023 4:37:17 PM By: [name] MD Entered By: [name] on 04/01/2023 09:57:29 -------------------------------------------------------------------------------- Physical Exam Details Patient Name: Date of Service: [name], [name] 04/01/2023 12:30 PM Medical Record Number: [id number] Patient Account Number: [id number] Date of Birth/Sex: Treating RN: February 10, 1946 (77 y.o. M) Primary Care Provider: [name] Other Clinician: Referring Provider: Treating Provider/Extender: [name] [facility], [place] [facility] in Treatment: 4 Notes Wound exam; the area on the left heel not any different from last week in terms of dimensions. Nonviable fibrinous slough I removed with Billy #3 curette minimal to no  bleeding. On the right things are closed over. Electronic Signature(s) Signed: 04/01/2023 4:37:17 PM By: [name] MD Entered By: [name] on 04/01/2023 09:58:21 -------------------------------------------------------------------------------- Physician Orders Details Patient Name: Date of Service: [name], [name] 04/01/2023 12:30 PM Medical Record Number: [id number] Patient Account Number: [id number] Date of Birth/Sex: Treating RN: March 21, 1946 (77 y.o. 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 - Dr. Leanord Hawking 04/09/2023 1230pm Return Billy Return appointment in 3 weeks. - Dr. Leanord Hawking 04/23/2023 1230 (already scheduled) Other: - ****Will skip wound care appt week of Thanksgiving. Will continue Hyperbarics.**** Once wounds close will discontinue hyperbarics. Wear shoe to right foot for protection and keep cover for protection 1-2 weeks. Anesthetic (In clinic) Topical Lidocaine 4% applied to wound bed - In clinic Bathing/ Shower/ Hygiene May shower with protection but do not get wound dressing(s) wet. Protect dressing(s) with water repellant cover (for example, large  plastic bag) or Billy cast cover and may then take shower. Off-Loading Wound #1 Left,Posterior Calcaneus Wedge shoe to: - Off loading shoes bilaterally to alleviate pressure from heels. Turn and reposition every 2 hours Prevalon Boot - bilaterally at bedtime Hyperbaric Oxygen Therapy Wound #1 Left,Posterior Calcaneus Evaluate for HBO Therapy - Per patient, he has claustrophobia. Evaluate for HBO Therapy - Per patient, he has claustrophobia. If appropriate for treatment, begin HBOT per protocol: 2.0 ATA for 90 Minutes without Billy Breaks ir 2.0  ATA for 90 Minutes without Billy Breaks ir Total Number of Treatments: - 40 One treatments per day (delivered Monday through Friday unless otherwise specified in Special Instructions below): One treatments per day (delivered Monday through Friday unless otherwise specified in Special Instructions below): Finger stick Blood Glucose Pre- and Post- HBOT Treatment. Finger stick Blood Glucose Pre- and Post- HBOT Treatment. Follow Hyperbaric Oxygen Glycemia Protocol Follow Hyperbaric Oxygen Glycemia Protocol Give two 4oz orange juices in addition to Glucerna when the glycemic protocol is used. Give two 4oz orange juices in addition to Glucerna when the glycemic protocol is used. Billy frin (Oxymetazoline HCL) 0.05% nasal spray - 1 spray in both nostrils daily as needed prior to HBO treatment for difficulty clearing ears Billy frin (Oxymetazoline HCL) 0.05% nasal spray - 1 spray in both nostrils daily as needed prior to HBO treatment for difficulty clearing ears Wound Treatment Wound #1 - Calcaneus Wound Laterality: Left, Posterior Cleanser: Wound Cleanser Every Other Day/30 Days Discharge Instructions: Cleanse the wound with wound cleanser prior to applying Billy clean dressing using gauze sponges, not tissue or cotton balls. Topical: Skintegrity Hydrogel 4 (oz) Every Other Day/30 Days Discharge Instructions: Apply hydrogel as directed Prim Dressing: Cutimed Sorbact 1.5  inx23/8 in Every Other Day/30 Days ary Discharge Instructions: with Hydrogel into the wound bed Secondary Dressing: ALLEVYN Heel 4 1/2in x 5 1/2in / 10.5cm x 13.5cm (Generic) Every Other Day/30 Days Discharge Instructions: Apply over primary dressing as directed. Secured With: [employer], 4.5x3.1 (in/yd) (Generic) Every Other Day/30 Days Discharge Instructions: Secure with Kerlix as directed. Secured With: 23M Medipore H Soft Cloth Surgical T ape, 4 x 10 (in/yd) (Generic) Every Other Day/30 Days Discharge Instructions: Secure with tape as directed. [name], [name] ([id number]) [id number] Page 4 of 8 GLYCEMIA INTERVENTIONS PROTOCOL PRE-HBO GLYCEMIA INTERVENTIONS ACTION INTERVENTION Obtain pre-HBO capillary blood glucose (ensure 1 physician order is in chart). Billy. Notify HBO physician and await physician orders. 2 If result is 70 mg/dl or below: B. If the result meets the hospital definition of Billy critical result, follow hospital policy. Billy. Give patient an 8 ounce Glucerna Shake, an 8 ounce Ensure, or 8 ounces of Billy Glucerna/Ensure equivalent dietary supplement*. B. Wait 30 minutes. If result is 71 mg/dl to 536 mg/dl: C. Retest patients capillary blood glucose (CBG). D. If result greater than or equal to 110 mg/dl, proceed with HBO. If result less than 110 mg/dl, notify HBO physician and consider holding HBO. If result is 131 mg/dl to 644 mg/dl: Billy. Proceed with HBO. Billy. Notify HBO physician and await physician orders. B. It is recommended to hold HBO and do If result is 250 mg/dl or greater: blood/urine ketone testing. C. If the result meets the hospital definition of Billy critical result, follow hospital policy. POST-HBO GLYCEMIA INTERVENTIONS ACTION INTERVENTION Obtain post HBO capillary blood glucose (ensure 1 physician order is in chart). Billy. Notify HBO physician and await physician orders. 2 If result is 70 mg/dl or below: B. If the result  meets the hospital definition of Billy critical result, follow hospital policy. Billy. Give patient an 8 ounce Glucerna Shake, an 8 ounce Ensure, or 8 ounces of Billy Glucerna/Ensure equivalent dietary supplement*. B. Wait 15 minutes for symptoms of If result is 71 mg/dl to 034 mg/dl: hypoglycemia (i.e. nervousness, anxiety, sweating, chills, clamminess, irritability, confusion, tachycardia or dizziness). C. If patient asymptomatic, discharge patient. If patient symptomatic, repeat capillary blood glucose (CBG) and notify HBO physician. If result is 101 mg/dl to 742 mg/dl:  Billy. Discharge patient. Billy. Notify HBO physician and await physician orders. B. It is recommended to do blood/urine ketone If result is 250 mg/dl or greater: testing. C. If the result meets the hospital definition of Billy critical result, follow hospital policy. *Juice or candies are NOT equivalent products. If patient refuses the Glucerna or Ensure, please consult the hospital dietitian for an appropriate substitute. Electronic Signature(s) Signed: 04/01/2023 4:28:03 PM By: Shawn Stall RN, BSN Signed: 04/01/2023 4:37:17 PM By: [name] MD Entered By: Shawn Stall on 04/01/2023 09:51:54 -------------------------------------------------------------------------------- Problem List Details Patient Name: Date of Service: [name], [name] 04/01/2023 12:30 PM Medical Record Number: [id number] Patient Account Number: [id number] Date of Birth/Sex: Treating RN: 02/07/1946 (77 y.o. [name] Primary Care Provider: [name] Other Clinician: Referring Provider: Treating Provider/Extender: Kathaleen Grinder in Treatment: 4 Active Problems ICD-10 Encounter BILAAL, DESAUTEL ([id number]) [id number] Page 5 of 8 Encounter Code Description Active Date MDM Diagnosis E11.621 Type 2 diabetes mellitus with foot ulcer 02/26/2023 No Yes L97.418 Non-pressure  chronic ulcer of right heel and midfoot with other specified 02/26/2023 No Yes severity 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 with other specified 02/26/2023 No Yes severity E11.42 Type 2 diabetes mellitus with diabetic polyneuropathy 02/26/2023 No Yes E11.51 Type 2 diabetes mellitus with diabetic peripheral angiopathy without gangrene 02/26/2023 No Yes Inactive Problems Resolved Problems Electronic Signature(s) Signed: 04/01/2023 4:37:17 PM By: [name] MD Entered By: [name] on 04/01/2023 09:55:03 -------------------------------------------------------------------------------- Progress Note Details Patient Name: Date of Service: [name], [name] 04/01/2023 12:30 PM Medical Record Number: [id number] Patient Account Number: [id number] Date of Birth/Sex: Treating RN: 1945-06-10 (77 y.o. M) Primary Care Provider: [name] Other Clinician: Referring Provider: Treating Provider/Extender: [name] [facility] [place] [facility] in Treatment: 4 Subjective History of Present Illness (HPI) ADMISSION 02/26/2023 Patient is Billy 77 year old man with type 2 diabetes. He has had Billy 84-month history of wounds on his bilateral heels just above the heel tips. They have been using Iodoflex/Iodosorb ointment. The patient has Billy history of PAD and has Billy SFA stent on the right. He also also status post left iliac stent. Furthermore he has Billy history of carotid artery stenosis status post right carotid endarterectomy and Billy left subclavian artery stenosis. His wound started on his blisters on the heel that of open to wounds with minimal but some depth right greater than left. He also has advanced diabetic neuropathy. The patient is cared for through the [place] and has been referred here for hyperbaric oxygen treatment through his vascular surgeon. Past medical history includes hypertension, hyperlipidemia, type 2 diabetes chronic kidney disease stage IIIb, carotid  artery stenosis status post right carotid endarterectomy, left subclavian artery stenosis ABIs done in our clinic were 1.11 on the right and 0.75 on the left these were remarkably similar to what was quoted during the vascular surgery appointment from 02/05/2023. 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 He was referred for hyperbaric oxygen by the [place]. His chest x-ray I think shows vascular crowding but no pneumothorax. He has been using Iodoflex on the wound with reasonably good improvement this week. He sees his ENT in 2 days and then they will make Billy decision about when they want to start HBO 10/24; patient is started HBO I believe he is on treatment 5 today. He had some problems with his right ear on the second dive  however he has had no problems since and says he feels well. He slept through treatment yesterday. He has been using Iodosorb ointment on the wounds on both heels for Billy long period even before he came here. Unfortunately the wound surface still does not look completely as viable as I would like. 11/5; he continues to tolerate HBO fairly well although he is having some fullness in his left ear that is reversed by popping his left ear. He has been using Sorbact hydrogel. He comes in today with the wounds quite Billy bit smaller especially on the right but also on the left. He is fairly rigorous and offloading these with heel offloading shoes. [name], [name] ([id number]) [id number] Page 6 of 8 11/11; right posterior calcaneus is closed down. The area on the left calcaneus laterally still has some depth. We have been using Sorbact on both sides. He has completed 12 out of 40 treatments Objective Constitutional Vitals Time Taken: 12:39 PM, Height: 70 in, Weight: 196 lbs, BMI: 28.1. General Notes: see vital signs from HBO. Integumentary (Hair, Skin) Wound #1 status is Open. Original cause of wound was  Blister. The date acquired was: 01/20/2023. The wound has been in treatment 4 weeks. The wound is located on the Left,Posterior Calcaneus. The wound measures 1.2cm length x 0.5cm width x 0.5cm depth; 0.471cm^2 area and 0.236cm^3 volume. There is Fat Layer (Subcutaneous Tissue) exposed. There is no tunneling or undermining noted. There is Billy medium amount of serosanguineous drainage noted. The wound margin is distinct with the outline attached to the wound base. There is small (1-33%) red, pink granulation within the wound bed. There is Billy large (67-100%) amount of necrotic tissue within the wound bed including Adherent Slough. The periwound skin appearance had no abnormalities noted for texture. The periwound skin appearance had no abnormalities noted for color. The periwound skin appearance exhibited: Dry/Scaly. Wound #2 status is Open. Original cause of wound was Blister. The date acquired was: 01/20/2023. The wound has been in treatment 4 weeks. The wound is located on the Right,Posterior Calcaneus. The wound measures 0cm length x 0cm width x 0cm depth; 0cm^2 area and 0cm^3 volume. There is no tunneling or undermining noted. There is Billy none present amount of drainage noted. The wound margin is distinct with the outline attached to the wound base. There is no granulation within the wound bed. There is no necrotic tissue within the wound bed. The periwound skin appearance had no abnormalities noted for texture. The periwound skin appearance had no abnormalities noted for color. The periwound skin appearance exhibited: Dry/Scaly. 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 There was Billy Excisional Skin/Subcutaneous Tissue Debridement with Billy total area of 0.47 sq cm performed by Maxwell Caul., MD. With the following instrument(s): Curette to remove Viable and Non-Viable tissue/material. Material removed includes Subcutaneous Tissue and Slough and after achieving pain control using Lidocaine 4% T opical Solution. Billy time out was conducted at 12:45, prior to the start of the procedure. Billy Minimum amount of bleeding was controlled with Pressure. The procedure  was tolerated well with Billy pain level of 0 throughout and Billy pain level of 0 following the procedure. Post Debridement Measurements: 1.2cm length x 0.5cm width x 0.5cm depth; 0.236cm^3 volume. Character of Wound/Ulcer Post Debridement is improved. Severity of Tissue Post Debridement is: Fat layer exposed. Post procedure Diagnosis Wound #1: Same as Pre-Procedure Plan Follow-up Appointments: Return Appointment in 1 week. - Dr. Leanord Hawking 04/09/2023 1230pm Return appointment in 3 weeks. - Dr. Leanord Hawking 04/23/2023 1230 (already scheduled) Other: - ****Will skip wound care appt week of Thanksgiving. Will continue Hyperbarics.**** Once wounds close will discontinue hyperbarics. Wear shoe to right foot for protection and keep cover for protection 1-2 weeks. Anesthetic: (In clinic) Topical Lidocaine 4% applied to wound bed - In clinic Bathing/ Shower/ Hygiene: May shower with protection but do not get wound dressing(s) wet. Protect dressing(s) with water repellant cover (for example, large plastic bag) or Billy cast cover and may then take shower. Off-Loading: Wound #1 Left,Posterior Calcaneus: Wedge shoe to: - Off loading shoes bilaterally to alleviate pressure from heels. Turn and reposition every 2 hours Prevalon  Boot - bilaterally at bedtime Hyperbaric Oxygen Therapy: [name], [name] ([id number]) [id number] Page 7 of 8 Wound #1 Left,Posterior Calcaneus: Evaluate for HBO Therapy - Per patient, he has claustrophobia. Evaluate for HBO Therapy - Per patient, he has claustrophobia. If appropriate for treatment, begin HBOT per protocol: 2.0 ATA for 90 Minutes without Air Breaks 2.0 ATA for 90 Minutes without Air Breaks T Number of Treatments: - 40 otal One treatments per day (delivered Monday through Friday unless otherwise specified in Special Instructions below): One treatments per day (delivered Monday through Friday unless otherwise specified in Special Instructions below): Finger stick Blood Glucose Pre- and Post- HBOT Treatment. Finger stick Blood Glucose Pre- and Post- HBOT Treatment. Follow Hyperbaric Oxygen Glycemia Protocol Follow Hyperbaric Oxygen Glycemia Protocol Give two 4oz orange juices in addition to Glucerna when the glycemic protocol is used. Give two 4oz orange juices in addition to Glucerna when the glycemic protocol is used. Afrin (Oxymetazoline HCL) 0.05% nasal spray - 1 spray in both nostrils daily as needed prior to HBO treatment for difficulty clearing ears Afrin (Oxymetazoline HCL) 0.05% nasal spray - 1 spray in both nostrils daily as needed prior to HBO treatment for difficulty clearing ears WOUND #1: - Calcaneus Wound Laterality: Left, Posterior Cleanser: Wound Cleanser Every Other Day/30 Days Discharge Instructions: Cleanse the wound with wound cleanser prior to applying Billy clean dressing using gauze sponges, not tissue or cotton balls. Topical: Skintegrity Hydrogel 4 (oz) Every Other Day/30 Days Discharge Instructions: Apply hydrogel as directed Prim Dressing: Cutimed Sorbact 1.5 inx23/8 in Every Other Day/30 Days ary Discharge Instructions: with Hydrogel into the wound bed Secondary Dressing: ALLEVYN Heel 4 1/2in x 5 1/2in / 10.5cm x 13.5cm  (Generic) Every Other Day/30 Days Discharge Instructions: Apply over primary dressing as directed. Secured With: [employer], 4.5x3.1 (in/yd) (Generic) Every Other Day/30 Days Discharge Instructions: Secure with Kerlix as directed. Secured With: 51M Medipore H Soft Cloth Surgical T ape, 4 x 10 (in/yd) (Generic) Every Other Day/30 Days Discharge Instructions: Secure with tape as directed. 1. The right heel is closed over. I warned him about excessive pressure we will keep this covered with Billy thick Band-Aid or bordered foam 2. Still Sorbact on the left although I may look towards changing to collagen by next week if this is no better 3. Tolerating HBO well Electronic Signature(s) Signed: 04/01/2023 4:37:17 PM By: [name] MD Entered By:  [name] on 04/01/2023 09:59:09 -------------------------------------------------------------------------------- SuperBill Details Patient Name: Date of Service: [name], [name] 04/01/2023 Medical Record Number: [id number] Patient Account Number: [id number] Date of Birth/Sex: Treating RN: [birth year] (77 y.o. 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 E11.42 Type 2 diabetes mellitus with diabetic polyneuropathy E11.51 Type 2 diabetes mellitus with diabetic peripheral angiopathy without gangrene Facility Procedures : CPT4 Code: 40981191 Description: 11042 - DEB SUBQ TISSUE 20 SQ CM/< ICD-10 Diagnosis Description 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 with other specified seve E11.621 Type 2  diabetes mellitus with foot ulcer Modifier: rity Quantity: 1 Physician Procedures [name], [name] ([id number]): CPT4 Code Description [id number] 11042 - WC PHYS SUBQ TISS 20 SQ CM ICD-10 Diagnosis Description 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

## 2023-04-01 NOTE — Progress Notes (Signed)
JAQUANN, MCQUOWN ([id number]) [id number] Page 1 of 1 Visit Report for 03/29/2023 SuperBill Details Patient Name: Date of Service: [name], [name] 03/29/2023 Medical Record Number: [id number] Patient Account Number: [id number] Date of Birth/Sex: Treating RN: 30-May-1945 (77 y.o. 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 E11.42 Type 2 diabetes mellitus with diabetic polyneuropathy E11.51 Type 2 diabetes mellitus with diabetic peripheral angiopathy without gangrene Facility Procedures CPT4 Code Description Modifier Quantity [id number] G0277-(Facility Use Only) HBOT full body chamber, , 4 ICD-10 Diagnosis Description 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 E11.42 Type 2 diabetes mellitus with diabetic polyneuropathy Physician Procedures Quantity CPT4 Code Description Modifier [id number] 99183 - WC PHYS HYPERBARIC OXYGEN THERAPY 1 ICD-10 Diagnosis Description 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

## 2023-04-02 ENCOUNTER — Encounter (HOSPITAL_BASED_OUTPATIENT_CLINIC_OR_DEPARTMENT_OTHER): Payer: No Typology Code available for payment source | Admitting: Internal Medicine

## 2023-04-02 ENCOUNTER — Ambulatory Visit (HOSPITAL_BASED_OUTPATIENT_CLINIC_OR_DEPARTMENT_OTHER): Payer: No Typology Code available for payment source | Admitting: Internal Medicine

## 2023-04-02 DIAGNOSIS — E11621 Type 2 diabetes mellitus with foot ulcer: Secondary | ICD-10-CM | POA: Diagnosis not present

## 2023-04-02 LAB — GLUCOSE, CAPILLARY
Glucose-Capillary: 180 mg/dL — ABNORMAL HIGH (ref 70–99)
Glucose-Capillary: 151 mg/dL — ABNORMAL HIGH (ref 70–99)

## 2023-04-02 NOTE — Progress Notes (Signed)
SALBADOR, SHEHAN ([id number]) [id number] Page 1 of 2 Visit Report for 04/02/2023 Arrival Information Details Patient Name: Date of Service: [name], [name] 04/02/2023 12:00 PM Medical Record Number: [id number] Patient Account Number: [id number] Date of Birth/Sex: Treating RN: Sep 25, 1945 (77 y.o. [name] Primary Care Rylyn Ranganathan: [name] Other Clinician: Karl Bales Referring Marks Scalera: Treating Stiles Maxcy/Extender: [name] [facility] [place] in Treatment: 5 Visit Information History Since Last Visit Pain Present Now: No Patient Arrived: Walker Arrival Time: 10:27 Accompanied By: Wife Transfer Assistance: None Patient Identification Verified: Yes Secondary Verification Process Completed: Yes Patient Requires Transmission-Based Precautions: No Patient Has Alerts: No Electronic Signature(s) Signed: 04/02/2023 2:06:55 PM By: Karl Bales EMT Entered By: Karl Bales on 04/02/2023 11:06:55 -------------------------------------------------------------------------------- Encounter Discharge Information Details Patient Name: Date of Service: [name], [name] 04/02/2023 12:00 PM Medical Record Number: [id number] Patient Account Number: [id number] Date of Birth/Sex: Treating RN: 01-27-1946 (77 y.o. [name] Primary Care Nate Perri: [name] Other Clinician: Karl Bales Referring Miasia Crabtree: Treating Larna Capelle/Extender: [name] [place] in Treatment: 5 Encounter Discharge Information Items Discharge Condition: Stable Ambulatory Status: Walker Discharge Destination: Home Transportation: Private Auto Accompanied By: None Schedule Follow-up Appointment: Yes Clinical Summary of Care: Electronic Signature(s) Signed: 04/02/2023 2:33:37 PM By: Karl Bales EMT Entered By: Karl Bales on 04/02/2023 11:33:37 Vitals  Details -------------------------------------------------------------------------------- [name] ([id number]) [id number] Page 2 of 2 Patient Name: Date of Service: [name], [name] 04/02/2023 12:00 PM Medical Record Number: [id number] Patient Account Number: [id number] Date of Birth/Sex: Treating RN: 12-Mar-1946 (77 y.o. [name] Primary Care Keani Gotcher: [name] Other Clinician: Karl Bales Referring Amy Belloso: Treating [place]/Extender: [name] [place] in Treatment: 5 Vital Signs Time Taken: 10:39 Temperature (F): 97.5 Height (in): 70 Pulse (bpm): 72 Weight (lbs): 196 Respiratory Rate (breaths/min): 18 Body Mass Index (BMI): 28.1 Blood Pressure (mmHg): 127/67 Capillary Blood Glucose (mg/dl): 253 Reference Range: 80 - 120 mg / dl Electronic Signature(s) Signed: 04/02/2023 2:16:01 PM By: Karl Bales EMT Entered By: Karl Bales on 04/02/2023 11:16:01

## 2023-04-02 NOTE — Progress Notes (Signed)
[name], [name] ([id number]) [id number] Page 1 of 2 Visit Report for 04/02/2023 HBO Details Patient Name: Date of Service: [name], [name] 04/02/2023 12:00 PM Medical Record Number: [id number] Patient Account Number: [id number] Date of Birth/Sex: Treating RN: [birth year] (77 y.o. [name] Primary Care Geselle Cardosa: [name] Other Clinician: Karl Bales Referring Eliud Polo: Treating Jamyah Folk/Extender: [name] [place] in Treatment: 5 HBO Treatment Course Details Treatment Course Number: 1 Ordering Chantella Creech: [name] T Treatments Ordered: otal 40 HBO Treatment Start Date: 03/11/2023 HBO Indication: Diabetic Ulcer(s) of the Lower Extremity Standard/Conservative Wound Care tried and failed greater than or equal to 30 days Wound #1 Left, Posterior Calcaneus , Wound #2 Right, Posterior Calcaneus HBO Treatment Details Treatment Number: 13 Patient Type: Outpatient Chamber Type: Monoplace Chamber Serial #: [id number] Treatment Protocol: 2.0 ATA with 90 minutes oxygen, and no air breaks Treatment Details Compression Rate Down: 2.0 psi / minute De-Compression Rate Up: 2.0 psi / minute Air breaks and breathing Decompress Decompress Compress Tx Pressure Begins Reached periods Begins Ends (leave unused spaces blank) Chamber Pressure (ATA 1 2 ------2 1 ) Clock Time (24 hr) 10:58 10:09 - - - - - - 12:50 12:57 Treatment Length: 119 (minutes) Treatment Segments: 4 Vital Signs Capillary Blood Glucose Reference Range: 80 - 120 mg / dl HBO Diabetic Blood Glucose Intervention Range: <131 mg/dl or >782 mg/dl Time Vitals Blood Respiratory Capillary Blood Glucose Pulse Action Type: Pulse: Temperature: Taken: Pressure: Rate: Glucose (mg/dl): Meter #: Oximetry (%) Taken: Pre 10:39 127/67 72 18 97.5 180 Post 12:57 152/72 70 18 97.4 Treatment Response Treatment Toleration: Well Treatment Completion  Status: Treatment Completed without Adverse Event Additional Procedure Documentation Tissue [name] Marlisa Caridi Notes No concerns with treatment given Physician HBO Attestation: I certify that I supervised this HBO treatment in accordance with Medicare guidelines. A trained emergency response team is readily available per Yes hospital policies and procedures. Continue HBOT as ordered. [address] AJMAL, GROBER ([id number]) [id number] Page 2 of 2 Signed: 04/03/2023 10:44:47 AM By: [name] MD Previous Signature: 04/02/2023 2:27:35 PM Version By: Karl Bales EMT Entered By: [name] on 04/02/2023 12:55:31 -------------------------------------------------------------------------------- HBO Safety Checklist Details Patient Name: Date of Service: [name], [name] 04/02/2023 12:00 PM Medical Record Number: [id number] Patient Account Number: [id number] Date of Birth/Sex: Treating RN: 1945/10/16 (77 y.o. [name] Primary Care Alberta Cairns: [name] Other Clinician: Karl Bales Referring [name]: Treating [name]/Extender: [name] [facility] [place] in Treatment: 5 HBO Safety Checklist Items Safety Checklist Consent Form Signed Patient voided / foley secured and emptied When did you last eato 0900 Last dose of injectable or oral agent 0830 Ostomy pouch emptied and vented if applicable NA All implantable devices assessed, documented and approved NA Intravenous access site secured and place NA Valuables secured Linens and cotton and cotton/polyester blend (less than 51% polyester) Personal oil-based products / skin lotions / body lotions removed Wigs or hairpieces removed NA Smoking or tobacco materials removed Books / newspapers / magazines / loose paper removed Cologne, aftershave, perfume and deodorant removed Jewelry removed (may wrap wedding band) Make-up  removed NA Hair care products removed Battery operated devices (external) removed Heating patches and chemical warmers removed Titanium eyewear removed NA Nail polish cured greater than 10 hours NA Casting material cured greater than 10 hours NA Hearing aids removed NA Loose dentures or partials removed NA Prosthetics have been removed NA Patient demonstrates correct use of air break device (  if applicable) Patient concerns have been addressed Patient grounding bracelet on and cord attached to chamber Specifics for Inpatients (complete in addition to above) Medication sheet sent with patient NA Intravenous medications needed or due during therapy sent with patient NA Drainage tubes (e.g. nasogastric tube or chest tube secured and vented) NA Endotracheal or Tracheotomy tube secured NA Cuff deflated of air and inflated with saline NA Airway suctioned NA Notes The safety checklist was done before the treatment was started. Electronic Signature(s) Signed: 04/02/2023 2:24:41 PM By: Karl Bales EMT Entered By: Karl Bales on 04/02/2023 11:24:40

## 2023-04-03 ENCOUNTER — Encounter (HOSPITAL_BASED_OUTPATIENT_CLINIC_OR_DEPARTMENT_OTHER): Payer: No Typology Code available for payment source | Admitting: Internal Medicine

## 2023-04-03 DIAGNOSIS — E11621 Type 2 diabetes mellitus with foot ulcer: Secondary | ICD-10-CM | POA: Diagnosis not present

## 2023-04-03 LAB — GLUCOSE, CAPILLARY
Glucose-Capillary: 189 mg/dL — ABNORMAL HIGH (ref 70–99)
Glucose-Capillary: 181 mg/dL — ABNORMAL HIGH (ref 70–99)

## 2023-04-03 NOTE — Progress Notes (Addendum)
[name], [name] ([id number]) [id number] Page 1 of 2 Visit Report for 04/02/2023 Problem List Details Patient Name: Date of Service: [name], [name] 04/02/2023 12:00 PM Medical Record Number: [id number] Patient Account Number: [id number] Date of Birth/Sex: Treating RN: Aug 20, 1945 (77 y.o. 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 04/02/2023 Medical Record Number: [id number] Patient Account Number: [id number] Date of Birth/Sex: Treating RN: Mar 15, 1946 (77 y.o. 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 E11.42 Type 2 diabetes mellitus with diabetic polyneuropathy E11.51 Type 2 diabetes mellitus with diabetic peripheral angiopathy without gangrene Facility Procedures : CPT4 Code Description: [id number] G0277-(Facility Use Only) HBOT full body chamber, , ICD-10 Diagnosis Description 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  Non-pressure chronic ulcer of left heel and midfoot with oth Modifier: her specified se er specified sev Quantity: 4 verity erity Physician Procedures : CPT4 Code Description Modifier [id number] [phone] - WC PHYS HYPERBARIC OXYGEN THERAPY ICD-10 Diagnosis Description 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

## 2023-04-03 NOTE — Progress Notes (Signed)
DONDRE, ROZAK ([id number]) [id number] Page 1 of 3 Visit Report for 04/03/2023 HBO Details Patient Name: Date of Service: [name], [name] 04/03/2023 10:00 Billy M Medical Record Number: [id number] Patient Account Number: [id number] Date of Birth/Sex: Treating RN: 01-13-1946 (77 y.o. [name] Primary Care Ciera Beckum: [facility] [place] Other Clinician: [name] Referring Hudsen Fei: Treating Tamarcus Condie/Extender: [name] [place] in Treatment: 5 HBO Treatment Course Details Treatment Course Number: 1 Ordering Sammie Schermerhorn: [name] T Treatments Ordered: otal 40 HBO Treatment Start Date: 03/11/2023 HBO Indication: Diabetic Ulcer(s) of the Lower Extremity Standard/Conservative Wound Care tried and failed greater than or equal to 30 days Wound #1 Left, Posterior Calcaneus , Wound #2 Right, Posterior Calcaneus HBO Treatment Details Treatment Number: 14 Patient Type: Outpatient Chamber Type: Monoplace Chamber Serial #: [id number] Treatment Protocol: 2.0 ATA with 90 minutes oxygen, and no air breaks Treatment Details Compression Rate Down: 1.5 psi / minute De-Compression Rate Up: 2.0 psi / minute Air breaks and breathing Decompress Decompress Compress Tx Pressure Begins Reached periods Begins Ends (leave unused spaces blank) Chamber Pressure (ATA 1 2 ------2 1 ) Clock Time (24 hr) 10:17 10:29 - - - - - - 11:59 12:14 Treatment Length: 117 (minutes) Treatment Segments: 4 Vital Signs Capillary Blood Glucose Reference Range: 80 - 120 mg / dl HBO Diabetic Blood Glucose Intervention Range: <131 mg/dl or >782 mg/dl Type: Time Vitals Blood Pulse: Respiratory Temperature: Capillary Blood Glucose Pulse Action Taken: Pressure: Rate: Glucose (mg/dl): Meter #: Oximetry (%) Taken: Pre 09:39 128/54 66 18 98.1 189 asymptomatic for hypotension Post 12:17 137/65 66 18 98.1 181 none per protocol Treatment  Response Treatment Toleration: Well Treatment Completion Status: Treatment Completed without Adverse Event Treatment Notes [name] arrived with normal vital signs with the exception of diastolic BP of 54 mmHg. He denied symptoms of hypotension and displayed no obvious signs thereof. He prepared for treatment. After performing Billy safety check, patient was placed in the chamber which was compressed at Billy rate of 1.5 psi/min after confirming normal ear equalization. He tolerated the treatment and subsequent decompression of the chamber at the rate of 2 psi/min. He denied any issues with ear equalization and/or pain associated with barotrauma. His post-treatment vital signs were within normal range. He was stable upon discharge with his wife. Additional Procedure Documentation Tissue [name] Ebany Bowermaster Notes No concerns with treatment given Physician HBO Attestation: I certify that I supervised this HBO treatment in accordance with Medicare [name], [name] ([id number]) [id number] Page 2 of 3 guidelines. Billy trained emergency response team is readily available per Yes hospital policies and procedures. Continue HBOT as ordered. Yes Electronic Signature(s) Signed: 04/04/2023 4:06:32 PM By: [name] MD Previous Signature: 04/03/2023 1:05:02 PM Version By: [name] CHT EMT BS , , Previous Signature: 04/03/2023 11:24:52 AM Version By: [name] CHT EMT BS , , Entered By: [name] on 04/03/2023 15:33:49 -------------------------------------------------------------------------------- HBO Safety Checklist Details Patient Name: Date of Service: [name], [name] 04/03/2023 10:00 Billy M Medical Record Number: [id number] Patient Account Number: [id number] Date of Birth/Sex: Treating RN: 1945/08/08 (77 y.o. [name] Primary Care Wilna Pennie: [name] Other Clinician: [name] Referring Aldean Suddeth: Treating  Breylan Lefevers/Extender: [name] [place] in Treatment: 5 HBO Safety Checklist Items Safety Checklist Consent Form Signed Patient voided / foley secured and emptied When did you last eato 0820 - Bacon Egg Chz Biscuit Last dose of injectable or oral agent 0730 - Metformin Ostomy  pouch emptied and vented if applicable NA All implantable devices assessed, documented and approved NA Intravenous access site secured and place NA Valuables secured Linens and cotton and cotton/polyester blend (less than 51% polyester) Personal oil-based products / skin lotions / body lotions removed Wigs or hairpieces removed NA Smoking or tobacco materials removed NA Books / newspapers / magazines / loose paper removed Cologne, aftershave, perfume and deodorant removed Jewelry removed (may wrap wedding band) Make-up removed NA Hair care products removed Battery operated devices (external) removed Heating patches and chemical warmers removed Titanium eyewear removed Nail polish cured greater than 10 hours NA Casting material cured greater than 10 hours NA Hearing aids removed at home Loose dentures or partials removed dentures removed Prosthetics have been removed NA Patient demonstrates correct use of air break device (if applicable) Patient concerns have been addressed Patient grounding bracelet on and cord attached to chamber Specifics for Inpatients (complete in addition to above) Medication sheet sent with patient NA Intravenous medications needed or due during therapy sent with patient NA Drainage tubes (e.g. nasogastric tube or chest tube secured and vented) NA Endotracheal or Tracheotomy tube secured NA Cuff deflated of air and inflated with saline NA Airway suctioned NA Notes Paper version used prior to treatment start. Electronic Signature(s) [name], [name] ([id number]) [id number] Page 3 of 3 Signed: 04/03/2023 11:16:50 AM By:  [name] CHT EMT BS , , Entered By: [name] on 04/03/2023 11:16:49

## 2023-04-03 NOTE — Progress Notes (Addendum)
MASASHI, CACHU ([id number]) [id number] Page 1 of 2 Visit Report for 04/03/2023 Arrival Information Details Patient Name: Date of Service: Billy [name], [name] 04/03/2023 10:00 Billy M Medical Record Number: [id number] Patient Account Number: [id number] Date of Birth/Sex: Treating RN: 03-Jan-1946 (77 y.o. [name] Primary Care Zorian Gunderman: [name] Other Clinician: [name] Referring Lawrnce Reyez: Treating Stephine Langbehn/Extender: [name] [facility], [place] in Treatment: 5 Visit Information History Since Last Visit All ordered tests and consults were completed: Yes Patient Arrived: Ambulatory Added or deleted any medications: No Arrival Time: 09:39 Any new allergies or adverse reactions: No Accompanied By: self Had Billy fall or experienced change in No Transfer Assistance: None activities of daily living that may affect Patient Identification Verified: Yes risk of falls: Secondary Verification Process Completed: Yes Signs or symptoms of abuse/neglect since last visito No Patient Requires Transmission-Based Precautions: No Hospitalized since last visit: No Patient Has Alerts: No Implantable device outside of the clinic excluding No cellular tissue based products placed in the center since last visit: Pain Present Now: No Electronic Signature(s) Signed: 04/03/2023 11:12:04 AM By: [name] CHT EMT BS , , Entered By: [name] on 04/03/2023 08:12:04 -------------------------------------------------------------------------------- Encounter Discharge Information Details Patient Name: Date of Service: Billy [name], [name] 04/03/2023 10:00 Billy M Medical Record Number: [id number] Patient Account Number: [id number] Date of Birth/Sex: Treating RN: [birth year] (77 y.o. [name] Primary Care Thuy Atilano: [name] Other Clinician: [name] Referring Numa Heatwole: Treating Tun[place] Landgrebe/Extender: Kathaleen Grinder in Treatment: 5 Encounter Discharge Information Items Discharge Condition: Stable Ambulatory Status: Ambulatory Discharge Destination: Home Transportation: Private Auto Accompanied By: self Schedule Follow-up Appointment: No Clinical Summary of Care: Electronic Signature(s) Signed: 04/03/2023 1:06:26 PM By: [name] CHT EMT BS , , Entered By: [name] on 04/03/2023 10:06:26 [name] ([id number]) [id number] Page 2 of 2 -------------------------------------------------------------------------------- Vitals Details Patient Name: Date of Service: Billy [name], [name] 04/03/2023 10:00 Billy M Medical Record Number: [id number] Patient Account Number: [id number] Date of Birth/Sex: Treating RN: 1946-01-09 (77 y.o. [name] Primary Care Ashly Yepez: Clinic, [place] Other Clinician: Karl Bales Referring Sidney Silberman: Treating Eldor Conaway/Extender: [name] [facility], [place] in Treatment: 5 Vital Signs Time Taken: 09:39 Temperature (F): 98.1 Height (in): 70 Pulse (bpm): 66 Weight (lbs): 196 Respiratory Rate (breaths/min): 18 Body Mass Index (BMI): 28.1 Blood Pressure (mmHg): 128/54 Capillary Blood Glucose (mg/dl): 220 Reference Range: 80 - 120 mg / dl Electronic Signature(s) Signed: 04/03/2023 11:14:01 AM By: [name] CHT EMT BS , , Entered By: [name] on 04/03/2023 08:14:01

## 2023-04-04 ENCOUNTER — Encounter (HOSPITAL_BASED_OUTPATIENT_CLINIC_OR_DEPARTMENT_OTHER): Payer: No Typology Code available for payment source | Admitting: Internal Medicine

## 2023-04-04 DIAGNOSIS — E11621 Type 2 diabetes mellitus with foot ulcer: Secondary | ICD-10-CM | POA: Diagnosis not present

## 2023-04-04 LAB — GLUCOSE, CAPILLARY
Glucose-Capillary: 200 mg/dL — ABNORMAL HIGH (ref 70–99)
Glucose-Capillary: 169 mg/dL — ABNORMAL HIGH (ref 70–99)

## 2023-04-04 NOTE — Progress Notes (Signed)
SAYON, PRIDE ([id number]) [id number] Page 1 of 2 Visit Report for 04/04/2023 Arrival Information Details Patient Name: Date of Service: [name], [name] 04/04/2023 10:00 A M Medical Record Number: [id number] Patient Account Number: [id number] Date of Birth/Sex: Treating RN: 08/31/1945 (77 y.o. [name] Primary Care [name]: [name] Other Clinician: Haywood [name] Referring [name]: Treating [name] Clinic, [name] in Treatment: 5 Visit Information History Since Last Visit All ordered tests and consults were completed: Yes Patient Arrived: [name] Added or deleted any medications: No Arrival Time: 09:02 Any new allergies or adverse reactions: No Accompanied By: spouse Had a fall or experienced change in No Transfer Assistance: None activities of daily living that may affect Patient Identification Verified: Yes risk of falls: Secondary Verification Process Completed: Yes Signs or symptoms of abuse/neglect since last visito No Patient Requires Transmission-Based Precautions: No Hospitalized since last visit: No Patient Has Alerts: No Implantable device outside of the clinic excluding No cellular tissue based products placed in the center since last visit: Pain Present Now: No Electronic Signature(s) Signed: 04/04/2023 2:46:34 PM By: [name] CHT EMT BS , , Entered By: [name] on 04/04/2023 11:46:34 -------------------------------------------------------------------------------- Encounter Discharge Information Details Patient Name: Date of Service: [name], [name] 04/04/2023 10:00 A M Medical Record Number: [id number] Patient Account Number: [id number] Date of Birth/Sex: Treating RN: [birth year] (77 y.o. [name] Primary Care [name]: [name] Other Clinician: Haywood [name] Referring [name]: Treating [name] Clinic, [name] in Treatment: 5 Encounter Discharge Information Items Discharge Condition: Stable Ambulatory Status: Ambulatory Discharge Destination: Home Transportation: Private Auto Accompanied By: self Schedule Follow-up Appointment: No Clinical Summary of Care: Electronic Signature(s) Signed: 04/04/2023 2:54:39 PM By: [name] CHT EMT BS , , Entered By: [name] on 04/04/2023 11:54:39 [name], [name] ([id number]) [id number] Page 2 of 2 -------------------------------------------------------------------------------- Vitals Details Patient Name: Date of Service: [name], [name] 04/04/2023 10:00 A M Medical Record Number: [id number] Patient Account Number: [id number] Date of Birth/Sex: Treating RN: December 22, 1945 (77 y.o. [name] Primary Care Ajayla Iglesias: [name] Other Clinician: Haywood [name] Referring Treazure Nery: Treating Ayo Guarino/Extender: [name] [facility], [name] in Treatment: 5 Vital Signs Time Taken: 09:07 Temperature (F): 97.9 Height (in): 70 Pulse (bpm): 82 Weight (lbs): 196 Respiratory Rate (breaths/min): 18 Body Mass Index (BMI): 28.1 Blood Pressure (mmHg): 139/61 Capillary Blood Glucose (mg/dl): 742 Reference Range: 80 - 120 mg / dl Electronic Signature(s) Signed: 04/04/2023 2:46:59 PM By: [name] CHT EMT BS , , Entered By: [name] on 04/04/2023 11:46:59

## 2023-04-04 NOTE — Progress Notes (Addendum)
JAYE, FRAGOZA ([id number]) [id number] Page 1 of 1 Visit Report for 04/04/2023 SuperBill Details Patient Name: Date of Service: [name], [name] 04/04/2023 Medical Record Number: [id number] Patient Account Number: [id number] Date of Birth/Sex: Treating RN: 02/07/1946 (77 y.o. 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 E11.42 Type 2 diabetes mellitus with diabetic polyneuropathy E11.51 Type 2 diabetes mellitus with diabetic peripheral angiopathy without gangrene Facility Procedures CPT4 Code Description Modifier Quantity [id number] G0277-(Facility Use Only) HBOT full body chamber, , 4 ICD-10 Diagnosis Description 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 E11.42 Type 2 diabetes mellitus with diabetic polyneuropathy Physician Procedures Quantity CPT4 Code Description Modifier 3086578 99183 - WC PHYS HYPERBARIC OXYGEN THERAPY 1 ICD-10 Diagnosis Description 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

## 2023-04-04 NOTE — Progress Notes (Addendum)
ZELIG, DOOLAN ([id number]) [id number] Page 1 of 1 Visit Report for 04/03/2023 SuperBill Details Patient Name: Date of Service: [name], [name]. 04/03/2023 Medical Record Number: [id number] Patient Account Number: [id number] Date of Birth/Sex: Treating RN: 03/17/1946 (77 y.o. 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 E11.42 Type 2 diabetes mellitus with diabetic polyneuropathy E11.51 Type 2 diabetes mellitus with diabetic peripheral angiopathy without gangrene Facility Procedures CPT4 Code Description Modifier Quantity [id number] G0277-(Facility Use Only) HBOT full body chamber, , 4 ICD-10 Diagnosis Description 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 E11.42 Type 2 diabetes mellitus with diabetic polyneuropathy Physician Procedures Quantity CPT4 Code Description Modifier [id number] 99183 - WC PHYS HYPERBARIC OXYGEN THERAPY 1 ICD-10 Diagnosis Description 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

## 2023-04-04 NOTE — Progress Notes (Addendum)
[name], [name] ([id number]) [id number] Page 1 of 3 Visit Report for 04/04/2023 HBO Details Patient Name: Date of Service: [name], [name] 04/04/2023 10:00 A M Medical Record Number: [id number] Patient Account Number: [id number] Date of Birth/Sex: Treating RN: 1946/04/11 (77 y.o. [name] Primary Care Kadin Bera: [name] Other Clinician: [name] Referring Yasmin Bronaugh: Treating Lafayette Dunlevy/Extender: [name] [facility], [place] in Treatment: 5 HBO Treatment Course Details Treatment Course Number: 1 Ordering Lexianna Weinrich: [name] T Treatments Ordered: otal 40 HBO Treatment Start Date: 03/11/2023 HBO Indication: Diabetic Ulcer(s) of the Lower Extremity Standard/Conservative Wound Care tried and failed greater than or equal to 30 days Wound #1 Left, Posterior Calcaneus , Wound #2 Right, Posterior Calcaneus HBO Treatment Details Treatment Number: 15 Patient Type: Outpatient Chamber Type: Monoplace Chamber Serial #: [id number] Treatment Protocol: 2.0 ATA with 90 minutes oxygen, and no air breaks Treatment Details Compression Rate Down: 1.0 psi / minute De-Compression Rate Up: 1.5 psi / minute Air breaks and breathing Decompress Decompress Compress Tx Pressure Begins Reached periods Begins Ends (leave unused spaces blank) Chamber Pressure (ATA 1 2 ------2 1 ) Clock Time (24 hr) 09:31 09:46 - - - - - - 11:16 11:30 Treatment Length: 119 (minutes) Treatment Segments: 4 Vital Signs Capillary Blood Glucose Reference Range: 80 - 120 mg / dl HBO Diabetic Blood Glucose Intervention Range: <131 mg/dl or >283 mg/dl Type: Time Vitals Blood Pulse: Respiratory Temperature: Capillary Blood Glucose Pulse Action Taken: Pressure: Rate: Glucose (mg/dl): Meter #: Oximetry (%) Taken: Pre 09:07 139/61 82 18 97.9 200 none per protocol Post 11:33 148/64 65 18 97.5 169 none per protocol (manual BP) Treatment  Response Treatment Toleration: Well Treatment Completion Status: Treatment Completed without Adverse Event Treatment Notes Mr. Sotto arrived with normal vital signs. He prepared for treatment. After performing a safety check, patient was placed in the chamber which was compressed at a rate of 1.5 psi/min after confirming normal ear equalization. He tolerated the treatment and subsequent decompression of the chamber at the rate of 1.5 psi/min. He denied any issues with ear equalization and/or pain associated with barotrauma. His post-treatment vital signs were within normal range. He was stable upon discharge with his wife. Additional Procedure Documentation Tissue [name] Leiann Sporer Notes No concerns with treatment given Physician HBO Attestation: I certify that I supervised this HBO treatment in accordance with Medicare guidelines. A trained emergency response team is readily available per Yes TEDRICK, RAINA ([id number]) [id number] Page 2 of 3 hospital policies and procedures. Continue HBOT as ordered. Yes Electronic Signature(s) Signed: 04/04/2023 4:06:32 PM By: Billy Najjar MD Previous Signature: 04/04/2023 2:53:38 PM Version By: [name] CHT EMT BS , , Entered By: [name] on 04/04/2023 12:34:33 -------------------------------------------------------------------------------- HBO Safety Checklist Details Patient Name: Date of Service: [name], [name] 04/04/2023 10:00 A M Medical Record Number: [id number] Patient Account Number: [id number] Date of Birth/Sex: Treating RN: 07-02-1945 (77 y.o. [name] Primary Care Nkenge Sonntag: [name] Other Clinician: [name] Referring Dedria Endres: Treating [name]/Extender: [name] [facility], [place] in Treatment: 5 HBO Safety Checklist Items Safety Checklist Consent Form Signed Patient voided / foley secured and emptied When did you  last eato 0830 - Bacon Egg Cheese Biscuit Last dose of injectable or oral agent 0814 - Metformin Ostomy pouch emptied and vented if applicable NA All implantable devices assessed, documented and approved NA Intravenous access site secured and place NA Valuables secured Linens and cotton and cotton/polyester blend (less than  51% polyester) Personal oil-based products / skin lotions / body lotions removed Wigs or hairpieces removed NA Smoking or tobacco materials removed NA Books / newspapers / magazines / loose paper removed Cologne, aftershave, perfume and deodorant removed Jewelry removed (may wrap wedding band) Make-up removed NA Hair care products removed Battery operated devices (external) removed Heating patches and chemical warmers removed Titanium eyewear removed Nail polish cured greater than 10 hours NA Casting material cured greater than 10 hours NA Hearing aids removed at home Loose dentures or partials removed dentures removed Prosthetics have been removed NA Patient demonstrates correct use of air break device (if applicable) Patient concerns have been addressed Patient grounding bracelet on and cord attached to chamber Specifics for Inpatients (complete in addition to above) Medication sheet sent with patient NA Intravenous medications needed or due during therapy sent with patient NA Drainage tubes (e.g. nasogastric tube or chest tube secured and vented) NA Endotracheal or Tracheotomy tube secured NA Cuff deflated of air and inflated with saline NA Airway suctioned NA Notes Paper version used prior to treatment start. Electronic Signature(s) Signed: 04/04/2023 2:50:03 PM By: [name] CHT EMT BS , , LEONTE, RAVELING ([id number]) [id number] Page 3 of 3 Entered By: [name] on 04/04/2023 11:50:03

## 2023-04-05 ENCOUNTER — Encounter (HOSPITAL_BASED_OUTPATIENT_CLINIC_OR_DEPARTMENT_OTHER): Payer: No Typology Code available for payment source | Admitting: General Surgery

## 2023-04-05 DIAGNOSIS — E11621 Type 2 diabetes mellitus with foot ulcer: Secondary | ICD-10-CM | POA: Diagnosis not present

## 2023-04-05 LAB — GLUCOSE, CAPILLARY: Glucose-Capillary: 160 mg/dL — ABNORMAL HIGH (ref 70–99)

## 2023-04-05 NOTE — Progress Notes (Signed)
KAYCEE, WINCKLER ([id number]) [id number] Page 1 of 2 Visit Report for 04/05/2023 Arrival Information Details Patient Name: Date of Service: [name], [name] 04/05/2023 9:30 A M Medical Record Number: [id number] Patient Account Number: [id number] Date of Birth/Sex: Treating RN: 01-30-1946 (77 y.o. [name] Primary Care Pradeep Beaubrun: Clinic, [place] Other Clinician: Daryll Brod Referring Shonn Farruggia: Treating Lihanna Biever/Extender: [name] [facility], [place] in Treatment: 5 Visit Information History Since Last Visit Added or deleted any medications: No Patient Arrived: [name] Any new allergies or adverse reactions: No Arrival Time: 08:45 Had a fall or experienced change in No Accompanied By: self activities of daily living that may affect Transfer Assistance: None risk of falls: Patient Identification Verified: Yes Signs or symptoms of abuse/neglect since last visito No Secondary Verification Process Completed: Yes Hospitalized since last visit: No Patient Requires Transmission-Based Precautions: No Implantable device outside of the clinic excluding No Patient Has Alerts: No cellular tissue based products placed in the center since last visit: Pain Present Now: No Electronic Signature(s) Signed: 04/05/2023 12:44:54 PM By: Demetria Pore Entered By: Demetria Pore on 04/05/2023 08:20:42 -------------------------------------------------------------------------------- Encounter Discharge Information Details Patient Name: Date of Service: [name], [name] 04/05/2023 9:30 A M Medical Record Number: [id number] Patient Account Number: [id number] Date of Birth/Sex: Treating RN: 1945-08-01 (77 y.o. [name] Primary Care Earl Losee: Clinic, [place] Other Clinician: Daryll Brod Referring Tysin Salada: Treating Sanvi Ehler/Extender: [name] [facility], [place] in Treatment: 5 Encounter Discharge Information  Items Discharge Condition: Stable Ambulatory Status: Walker Discharge Destination: Home Transportation: Private Auto Accompanied By: self Schedule Follow-up Appointment: Yes Clinical Summary of Care: Electronic Signature(s) Signed: 04/05/2023 12:44:54 PM By: Demetria Pore Entered By: Demetria Pore on 04/05/2023 08:41:33 [name] ([id number]) [id number] Page 2 of 2 -------------------------------------------------------------------------------- Vitals Details Patient Name: Date of Service: [name], [name] 04/05/2023 9:30 A M Medical Record Number: [id number] Patient Account Number: [id number] Date of Birth/Sex: Treating RN: 1946-03-21 (77 y.o. [name] Primary Care Sativa Gelles: Clinic, [place] Other Clinician: Daryll Brod Referring Natividad Halls: Treating Deshannon Hinchliffe/Extender: [name], [place] in Treatment: 5 Vital Signs Time Taken: 08:45 Temperature (F): 97.6 Height (in): 70 Pulse (bpm): 78 Weight (lbs): 196 Respiratory Rate (breaths/min): 16 Body Mass Index (BMI): 28.1 Blood Pressure (mmHg): 146/75 Reference Range: 80 - 120 mg / dl Electronic Signature(s) Signed: 04/05/2023 12:44:54 PM By: Demetria Pore Entered By: Demetria Pore on 04/05/2023 08:22:15

## 2023-04-05 NOTE — Progress Notes (Signed)
[name], Billy ([id number]) [id number] Page 1 of 2 Visit Report for 04/05/2023 Problem List Details Patient Name: Date of Service: [name], Billy Billy S. 04/05/2023 9:30 A M Medical Record Number: [id number] Patient Account Number: [id number] Date of Birth/Sex: Treating RN: April 22, 1946 (77 y.o. 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 with other 02/26/2023 No Yes specified severity E11.42 Type 2 diabetes mellitus with diabetic polyneuropathy 02/26/2023 No Yes E11.51 Type 2 diabetes mellitus with diabetic peripheral angiopathy 02/26/2023 No Yes without gangrene Inactive Problems Resolved Problems Electronic Signature(s) Signed: 04/05/2023 12:44:54 PM By: Demetria Pore Signed: 04/05/2023 12:51:55 PM By: [name] MD FACS Entered By: Demetria Pore on 04/05/2023 08:41:16 -------------------------------------------------------------------------------- SuperBill Details Patient Name: Date of Service: [name], Billy Billy S. 04/05/2023 Medical Record Number: [id number] Patient Account Number: [id number] Date of Birth/Sex: Treating RN: 1946/04/05 (77 y.o. 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 E11.42 Type 2 diabetes mellitus with diabetic polyneuropathy E11.51 Type 2 diabetes mellitus with diabetic peripheral angiopathy without gangrene Facility Procedures : CPT4 Code: 60109323 Description: G0277-(Facility Use Only) HBOT full body chamber, , Modifier: Quantity: 4 Physician Procedures : CPT4 Code Description Modifier [id number] 25427 - WC PHYS HYPERBARIC OXYGEN THERAPY ICD-10 Diagnosis Description 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

## 2023-04-05 NOTE — Progress Notes (Signed)
TAREQ, FERRAR ([id number]) [id number] Page 1 of 3 Visit Report for 04/05/2023 HBO Details Patient Name: Date of Service: [name], [name] 04/05/2023 9:30 A M Medical Record Number: [id number] Patient Account Number: [id number] Date of Birth/Sex: Treating RN: [birth year] (77 y.o. [name] Primary Care Kailyn Dubie: [facility], [place] Other Clinician: Daryll Brod Referring Whittney Steenson: Treating Mayara Paulson/Extender: [name] [facility] [place] in Treatment: 5 HBO Treatment Course Details Treatment Course Number: 1 Ordering Austan Nicholl: [name] T Treatments Ordered: otal 40 HBO Treatment Start Date: 03/11/2023 HBO Indication: Diabetic Ulcer(s) of the Lower Extremity Standard/Conservative Wound Care tried and failed greater than or equal to 30 days Wound #1 Left, Posterior Calcaneus , Wound #2 Right, Posterior Calcaneus HBO Treatment Details Treatment Number: 16 Patient Type: Outpatient Chamber Type: Monoplace Chamber Serial #: [id number] Treatment Protocol: 2.0 ATA with 90 minutes oxygen, and no air breaks Treatment Details Compression Rate Down: 1.5 psi / minute De-Compression Rate Up: 1.5 psi / minute Air breaks and breathing Decompress Decompress Compress Tx Pressure Begins Reached periods Begins Ends (leave unused spaces blank) Chamber Pressure (ATA 1 2 ------2 1 ) Clock Time (24 hr) 9:00 9:15 - - - - - - 10:46 11:00 Treatment Length: 120 (minutes) Treatment Segments: 4 Vital Signs Capillary Blood Glucose Reference Range: 80 - 120 mg / dl HBO Diabetic Blood Glucose Intervention Range: <131 mg/dl or >427 mg/dl Time Vitals Blood Respiratory Capillary Blood Glucose Pulse Action Type: Pulse: Temperature: Taken: Pressure: Rate: Glucose (mg/dl): Meter #: Oximetry (%) Taken: Pre 08:45 146/75 78 16 97.6 Post 11:00 127/58 63 16 97.5 169 1 Treatment Response Treatment Toleration: Well Treatment Completion Status:  Treatment Completed without Adverse Event Treatment Notes Glucose was not taken before HBO due to paper work alert missing. Problem has been corrected I certify that I directed and performed operation of said hyperbaric chamber for this treatment. [place] Mr. Twite arrived with normal vital signs other than blood glucose level. There was an issue identified with the treatment worksheet and process of preparing patient. Fortunately, patient did not experience any ill effects. Process has been updated and corrected. Patient did in fact verify that he ate breakfast/meal. He prepared for treatment. After performing a safety check, patient was placed in the chamber which was compressed at the rate of 1.5 psi/min after confirming normal ear equalization. He tolerated the treatment and subsequent decompression of the chamber at the rate of 1.5 psi/min. His post-treatment vital signs were all measured and were within normal range. He was stable upon discharge with his wife. Additional Procedure Documentation Tissue [name] Physician HBO Attestation: I certify that I supervised this HBO treatment in accordance with Medicare TYWONE, OLANDER ([id number]) [id number] Page 2 of 3 guidelines. A trained emergency response team is readily available per Yes hospital policies and procedures. Continue HBOT as ordered. Yes Electronic Signature(s) Signed: 04/10/2023 9:03:34 AM By: [name] CHT EMT BS , , Signed: 04/10/2023 9:47:31 AM By: [name] MD FACS Previous Signature: 04/10/2023 8:55:46 AM Version By: [name] CHT EMT BS , , Previous Signature: 04/05/2023 12:57:11 PM Version By: [name] MD FACS Previous Signature: 04/05/2023 12:44:54 PM Version By: Demetria Pore Entered By: [name] on 04/10/2023 09:03:34 -------------------------------------------------------------------------------- HBO Safety Checklist  Details Patient Name: Date of Service: [name], [name] 04/05/2023 9:30 A M Medical Record Number: [id number] Patient Account Number: [id number] Date of Birth/Sex: Treating RN: 1946/02/20 (77 y.o. [name] Primary Care Kaysin Brock: [name] Other Clinician:  Daryll Brod Referring Mckena Chern: Treating Maecyn Panning/Extender: [name] [name] Weeks in Treatment: 5 HBO Safety Checklist Items Safety Checklist Consent Form Signed Patient voided / foley secured and emptied When did you last eato 8:14 egg bacon and cheese biscuit Last dose of injectable or oral agent Ostomy pouch emptied and vented if applicable NA All implantable devices assessed, documented and approved NA Intravenous access site secured and place NA Valuables secured Linens and cotton and cotton/polyester blend (less than 51% polyester) Personal oil-based products / skin lotions / body lotions removed Wigs or hairpieces removed Smoking or tobacco materials removed Books / newspapers / magazines / loose paper removed Cologne, aftershave, perfume and deodorant removed Jewelry removed (may wrap wedding band) Make-up removed NA Hair care products removed Battery operated devices (external) removed NA Heating patches and chemical warmers removed NA Titanium eyewear removed NA Nail polish cured greater than 10 hours NA Casting material cured greater than 10 hours NA Hearing aids removed NA Loose dentures or partials removed NA Prosthetics have been removed NA Patient demonstrates correct use of air break device (if applicable) Patient concerns have been addressed Patient grounding bracelet on and cord attached to chamber Specifics for Inpatients (complete in addition to above) Medication sheet sent with patient NA Intravenous medications needed or due during therapy sent with patient NA Drainage tubes (e.g. nasogastric tube or chest tube secured and  vented) NA Endotracheal or Tracheotomy tube secured NA Cuff deflated of air and inflated with saline NA Airway suctioned NA Notes Paper version used prior to treatment start. KARAC, LOOKABILL ([id number]) [id number] Page 3 of 3 Electronic Signature(s) Signed: 04/10/2023 8:52:14 AM By: [name] CHT EMT BS , , Previous Signature: 04/05/2023 12:44:54 PM Version By: Demetria Pore Entered By: [name] on 04/10/2023 08:52:14

## 2023-04-08 ENCOUNTER — Encounter (HOSPITAL_BASED_OUTPATIENT_CLINIC_OR_DEPARTMENT_OTHER): Payer: No Typology Code available for payment source | Admitting: Internal Medicine

## 2023-04-08 DIAGNOSIS — E11621 Type 2 diabetes mellitus with foot ulcer: Secondary | ICD-10-CM | POA: Diagnosis not present

## 2023-04-08 LAB — GLUCOSE, CAPILLARY: Glucose-Capillary: 162 mg/dL — ABNORMAL HIGH (ref 70–99)

## 2023-04-08 NOTE — Progress Notes (Addendum)
ALI, PASCASIO ([id number]) [id number] Page 1 of 3 Visit Report for 04/08/2023 HBO Details Patient Name: Date of Service: [name], [name] 04/08/2023 12:00 PM Medical Record Number: [id number] Patient Account Number: [id number] Date of Birth/Sex: Treating RN: 11/22/1945 (77 y.o. [name] Primary Care Diannia Hogenson: [name] Other Clinician: [name] Referring Shavonne Ambroise: Treating Austynn Pridmore/Extender: [name] [facility] [place] in Treatment: 5 HBO Treatment Course Details Treatment Course Number: 1 Ordering Nazareth Kirk: [name] T Treatments Ordered: otal 40 HBO Treatment Start Date: 03/11/2023 HBO Indication: Diabetic Ulcer(s) of the Lower Extremity Standard/Conservative Wound Care tried and failed greater than or equal to 30 days Wound #1 Left, Posterior Calcaneus , Wound #2 Right, Posterior Calcaneus HBO Treatment Details Treatment Number: 17 Patient Type: Outpatient Chamber Type: Monoplace Chamber Serial #: [id number] Treatment Protocol: 2.0 ATA with 90 minutes oxygen, and no air breaks Treatment Details Compression Rate Down: 1.5 psi / minute De-Compression Rate Up: 1.5 psi / minute Air breaks and breathing Decompress Decompress Compress Tx Pressure Begins Reached periods Begins Ends (leave unused spaces blank) Chamber Pressure (ATA 1 2 ------2 1 ) Clock Time (24 hr) 12:25 12:37 - - - - - - 14:07 14:20 Treatment Length: 115 (minutes) Treatment Segments: 4 Vital Signs Capillary Blood Glucose Reference Range: 80 - 120 mg / dl HBO Diabetic Blood Glucose Intervention Range: <131 mg/dl or >086 mg/dl Type: Time Vitals Blood Respiratory Capillary Blood Glucose Pulse Action Pulse: Temperature: Taken: Pressure: Rate: Glucose (mg/dl): Meter #: Oximetry (%) Taken: Pre 12:09 157/66 66 18 97.9 162 none per protocol Post 14:23 144/95 67 18 97.8 149 none per protocol Treatment Response Treatment  Toleration: Well Treatment Completion Status: Treatment Completed without Adverse Event Treatment Notes [name] arrived with normal vital signs. He prepared for treatment. After performing a safety check, he was placed in the chamber which was compressed at a rate of 1.5 psi/min after confirming normal ear equalization. He tolerated the treatment and subsequent decompression at the rate of 1.5 psi/min. Post- treatment vital signs were within normal range. He was stable upon discharge with his wife. Additional Procedure Documentation Tissue Sevierity: Fat layer exposed Martavia Tye Notes No concerns with treatment given Physician HBO Attestation: I certify that I supervised this HBO treatment in accordance with Medicare guidelines. A trained emergency response team is readily available per Yes hospital policies and procedures. [name], [name] ([id number]) [id number] Page 2 of 3 Continue HBOT as ordered. Yes Electronic Signature(s) Signed: 04/08/2023 4:31:26 PM By: [name] MD Previous Signature: 04/08/2023 3:37:47 PM Version By: [name] CHT EMT BS , , Previous Signature: 04/08/2023 3:34:11 PM Version By: [name] CHT EMT BS , , Previous Signature: 04/08/2023 12:57:23 PM Version By: Karl Bales EMT Entered By: [name] on 04/08/2023 16:06:18 -------------------------------------------------------------------------------- HBO Safety Checklist Details Patient Name: Date of Service: [name], [name] 04/08/2023 12:00 PM Medical Record Number: [id number] Patient Account Number: [id number] Date of Birth/Sex: Treating RN: 1945-12-23 (77 y.o. [name] Primary Care Madasyn Heath: [name] Other Clinician: Karl Bales Referring Eisley Barber: Treating Chastelyn Athens/Extender: [name] [facility] [place] in Treatment: 5 HBO Safety Checklist Items Safety Checklist Consent Form Signed Patient voided /  foley secured and emptied When did you last eato 0915 Last dose of injectable or oral agent 0800 Ostomy pouch emptied and vented if applicable NA All implantable devices assessed, documented and approved NA Intravenous access site secured and place NA Valuables secured Linens and cotton and cotton/polyester blend (less than 51% polyester)  Personal oil-based products / skin lotions / body lotions removed Wigs or hairpieces removed NA Smoking or tobacco materials removed Books / newspapers / magazines / loose paper removed Cologne, aftershave, perfume and deodorant removed Jewelry removed (may wrap wedding band) Make-up removed NA Hair care products removed Battery operated devices (external) removed Heating patches and chemical warmers removed Titanium eyewear removed NA Nail polish cured greater than 10 hours NA Casting material cured greater than 10 hours NA Hearing aids removed NA Loose dentures or partials removed removed by patient Prosthetics have been removed NA Patient demonstrates correct use of air break device (if applicable) Patient concerns have been addressed Patient grounding bracelet on and cord attached to chamber Specifics for Inpatients (complete in addition to above) Medication sheet sent with patient NA Intravenous medications needed or due during therapy sent with patient NA Drainage tubes (e.g. nasogastric tube or chest tube secured and vented) NA Endotracheal or Tracheotomy tube secured NA Cuff deflated of air and inflated with saline NA Airway suctioned NA Notes The safety checklist was done before the treatment was started. Electronic Signature(s) Signed: 04/08/2023 12:56:38 PM By: Etheleen Nicks ([id number]) [id number] Page 3 of 3 Entered By: Karl Bales on 04/08/2023 12:56:37

## 2023-04-08 NOTE — Progress Notes (Addendum)
AYAANSH, JURAS ([id number]) [id number] Page 1 of 2 Visit Report for 04/08/2023 Arrival Information Details Patient Name: Date of Service: [name], [name] 04/08/2023 12:00 PM Medical Record Number: [id number] Patient Account Number: [id number] Date of Birth/Sex: Treating RN: 10-Oct-1945 (77 y.o. [name] Primary Care Amanada Philbrick: [name] Other Clinician: Karl Bales Referring Quadarius Henton: Treating Tomer Chalmers/Extender: [name] [facility] [place] in Treatment: 5 Visit Information History Since Last Visit All ordered tests and consults were completed: Yes Patient Arrived: [name] Added or deleted any medications: No Arrival Time: 12:02 Any new allergies or adverse reactions: No Accompanied By: Wife Had a fall or experienced change in No Transfer Assistance: None activities of daily living that may affect Patient Identification Verified: Yes risk of falls: Secondary Verification Process Completed: Yes Signs or symptoms of abuse/neglect since last visito No Patient Requires Transmission-Based Precautions: No Hospitalized since last visit: No Patient Has Alerts: No Implantable device outside of the clinic excluding No cellular tissue based products placed in the center since last visit: Pain Present Now: No Electronic Signature(s) Signed: 04/08/2023 12:54:19 PM By: Karl Bales EMT Entered By: Karl Bales on 04/08/2023 09:54:19 -------------------------------------------------------------------------------- Encounter Discharge Information Details Patient Name: Date of Service: [name], [name] 04/08/2023 12:00 PM Medical Record Number: [id number] Patient Account Number: [id number] Date of Birth/Sex: Treating RN: 1945/06/11 (77 y.o. [name] Primary Care Samya Siciliano: [name] Other Clinician: [name] Referring Brylin Stopper: Treating Cheria Sadiq/Extender: [name]  [place] in Treatment: 5 Encounter Discharge Information Items Discharge Condition: Stable Ambulatory Status: Ambulatory Discharge Destination: Home Transportation: Private Auto Accompanied By: spouse Schedule Follow-up Appointment: No Clinical Summary of Care: Electronic Signature(s) Signed: 04/08/2023 3:39:55 PM By: [name] CHT EMT BS , , Entered By: [name] on 04/08/2023 12:39:55 [name] ([id number]) [id number] Page 2 of 2 -------------------------------------------------------------------------------- Vitals Details Patient Name: Date of Service: [name], [name] 04/08/2023 12:00 PM Medical Record Number: [id number] Patient Account Number: [id number] Date of Birth/Sex: Treating RN: [birth year] (77 y.o. [name] Primary Care Imanii Gosdin: [name] Other Clinician: [name] Referring Kelissa Merlin: Treating Kallan Merrick/Extender: [name] [place] in Treatment: 5 Vital Signs Time Taken: 12:09 Temperature (F): 97.9 Height (in): 70 Pulse (bpm): 66 Weight (lbs): 196 Respiratory Rate (breaths/min): 18 Body Mass Index (BMI): 28.1 Blood Pressure (mmHg): 157/66 Capillary Blood Glucose (mg/dl): 387 Reference Range: 80 - 120 mg / dl Electronic Signature(s) Signed: 04/08/2023 3:30:30 PM By: [name] CHT EMT BS , , Previous Signature: 04/08/2023 12:54:49 PM Version By: Karl Bales EMT Entered By: [name] on 04/08/2023 12:30:30

## 2023-04-08 NOTE — Progress Notes (Addendum)
[name], [name] ([id number]) [id number] Page 1 of 1 Visit Report for 04/08/2023 SuperBill Details Patient Name: Date of Service: [name], [name] 04/08/2023 Medical Record Number: [id number] Patient Account Number: [id number] Date of Birth/Sex: Treating RN: 09-10-1945 (77 y.o. 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 E11.42 Type 2 diabetes mellitus with diabetic polyneuropathy E11.51 Type 2 diabetes mellitus with diabetic peripheral angiopathy without gangrene Facility Procedures CPT4 Code Description Modifier Quantity [id number]-(Facility Use Only) HBOT full body chamber, , 4 ICD-10 Diagnosis Description 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 E11.42 Type 2 diabetes mellitus with diabetic polyneuropathy Physician Procedures Quantity CPT4 Code Description Modifier [id number] - WC PHYS HYPERBARIC OXYGEN THERAPY 1 ICD-10 Diagnosis Description 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

## 2023-04-09 ENCOUNTER — Encounter (HOSPITAL_BASED_OUTPATIENT_CLINIC_OR_DEPARTMENT_OTHER): Payer: No Typology Code available for payment source | Admitting: Internal Medicine

## 2023-04-09 DIAGNOSIS — E11621 Type 2 diabetes mellitus with foot ulcer: Secondary | ICD-10-CM | POA: Diagnosis not present

## 2023-04-09 LAB — GLUCOSE, CAPILLARY
Glucose-Capillary: 149 mg/dL — ABNORMAL HIGH (ref 70–99)
Glucose-Capillary: 165 mg/dL — ABNORMAL HIGH (ref 70–99)

## 2023-04-09 NOTE — Progress Notes (Addendum)
JEANNE, [name] ([id number]) [id number] Page 1 of 2 Visit Report for 04/09/2023 HBO Details Patient Name: Date of Service: [name], [name]. 04/09/2023 1:15 PM Medical Record Number: [id number] Patient Account Number: [id number] Date of Birth/Sex: Treating RN: Jan 11, 1946 (77 y.o. [name] Primary Care [name]: [facility], [place] Other Clinician: [name] Referring [name]: Treating [name]/Extender: [name] [facility] [place] in Treatment: 6 HBO Treatment Course Details Treatment Course Number: 1 Ordering [name]: [name] T Treatments Ordered: otal 40 HBO Treatment Start Date: 03/11/2023 HBO Indication: Diabetic Ulcer([name] of the Lower Extremity Standard/Conservative Wound Care tried and failed greater than or equal to 30 days Wound #1 Left, Posterior Calcaneus , Wound #2 Right, Posterior Calcaneus HBO Treatment Details Treatment Number: 18 Patient Type: Outpatient Chamber Type: Monoplace Chamber Serial #: [id number] Treatment Protocol: 2.0 ATA with 90 minutes oxygen, and no air breaks Treatment Details Compression Rate Down: 2.0 psi / minute De-Compression Rate Up: 2.0 psi / minute Air breaks and breathing Decompress Decompress Compress Tx Pressure Begins Reached periods Begins Ends (leave unused spaces blank) Chamber Pressure (ATA 1 2 ------2 1 ) Clock Time (24 hr) 13:15 13:25 - - - - - - 14:55 15:03 Treatment Length: 108 (minutes) Treatment Segments: 4 Vital Signs Capillary Blood Glucose Reference Range: 80 - 120 mg / dl HBO Diabetic Blood Glucose Intervention Range: <131 mg/dl or >347 mg/dl Time Vitals Blood Respiratory Capillary Blood Glucose Pulse Action Type: Pulse: Temperature: Taken: Pressure: Rate: Glucose (mg/dl): Meter #: Oximetry (%) Taken: Pre 12:40 141/62 60 18 98.6 Pre 13:00 165 Post 15:11 147/82 61 18 97.2 130 Treatment Response Treatment Toleration: Well Treatment  Completion Status: Treatment Completed without Adverse Event Additional Procedure Documentation Tissue [name] [name] Notes No concerns with treatment given. Patient was also seen for wound care evaluation Physician HBO Attestation: I certify that I supervised this HBO treatment in accordance with Medicare guidelines. Billy trained emergency response team is readily available per Yes hospital policies and procedures. Continue HBOT as ordered. [address] [name] ([id number]) [id number] Page 2 of 2 Electronic Signature([name] Signed: 04/09/2023 4:19:19 PM By: Billy Najjar MD Previous Signature: 04/09/2023 3:21:42 PM Version By: [name] EMT Previous Signature: 04/09/2023 2:17:14 PM Version By: [name] EMT Entered By: [name] on 04/09/2023 12:58:27 -------------------------------------------------------------------------------- HBO Safety Checklist Details Patient Name: Date of Service: [name], [name]. 04/09/2023 1:15 PM Medical Record Number: [id number] Patient Account Number: [id number] Date of Birth/Sex: Treating RN: 25-Sep-1945 (77 y.o. [name] Primary Care [name]: [facility], [name] Other Clinician: [name] Referring [name]: Treating [name]/Extender: [name] [facility] [place] in Treatment: 6 HBO Safety Checklist Items Safety Checklist Consent Form Signed Patient voided / foley secured and emptied When did you last eato 0930 Last dose of injectable or oral agent 0800 Ostomy pouch emptied and vented if applicable NA All implantable devices assessed, documented and approved NA Intravenous access site secured and place NA Valuables secured Linens and cotton and cotton/polyester blend (less than 51% polyester) Personal oil-based products / skin lotions / body lotions removed Wigs or hairpieces removed NA Smoking or tobacco materials removed Books / newspapers /  magazines / loose paper removed Cologne, aftershave, perfume and deodorant removed Jewelry removed (may wrap wedding band) Make-up removed NA Hair care products removed Battery operated devices (external) removed Heating patches and chemical warmers removed Titanium eyewear removed NA Nail polish cured greater than 10 hours NA Casting material cured greater than 10 hours NA Hearing aids  removed NA Loose dentures or partials removed removed by patient Prosthetics have been removed NA Patient demonstrates correct use of air break device (if applicable) Patient concerns have been addressed Patient grounding bracelet on and cord attached to chamber Specifics for Inpatients (complete in addition to above) Medication sheet sent with patient NA Intravenous medications needed or due during therapy sent with patient NA Drainage tubes (e.g. nasogastric tube or chest tube secured and vented) NA Endotracheal or Tracheotomy tube secured NA Cuff deflated of air and inflated with saline NA Airway suctioned NA Notes The safety checklist was done before the treatment was started. Electronic Signature([name] Signed: 04/09/2023 2:16:14 PM By: [name] EMT Entered By: [name] on 04/09/2023 11:16:14

## 2023-04-09 NOTE — Progress Notes (Signed)
[name], [name] ([id number]) [id number] Page 1 of 7 Visit Report for 04/09/2023 HPI Details Patient Name: Date of Service: [name], [name] 04/09/2023 12:30 PM Medical Record Number: [id number] Patient Account Number: [id number] Date of Birth/Sex: Treating RN: [birth year] (77 y.o. M) Primary Care Provider: [name] Other Clinician: Referring Provider: Treating Provider/Extender: Milas Hock, York Cerise in Treatment: 6 History of Present Illness HPI Description: ADMISSION 02/26/2023 Patient is Billy 77 year old man with type 2 diabetes. He has had Billy 38-month history of wounds on his bilateral heels just above the heel tips. They have been using Iodoflex/Iodosorb ointment. The patient has Billy history of PAD and has Billy SFA stent on the right. He also also status post left iliac stent. Furthermore he has Billy history of carotid artery stenosis status post right carotid endarterectomy and Billy left subclavian artery stenosis. His wound started on his blisters on the heel that of open to wounds with minimal but some depth right greater than left. He also has advanced diabetic neuropathy. The patient is cared for through the [place] and has been referred here for hyperbaric oxygen treatment through his vascular surgeon. Past medical history includes hypertension, hyperlipidemia, type 2 diabetes chronic kidney disease stage IIIb, carotid artery stenosis status post right carotid endarterectomy, left subclavian artery stenosis ABIs done in our clinic were 1.11 on the right and 0.75 on the left these were remarkably similar to what was quoted during the vascular surgery appointment from 02/05/2023. 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 He was referred for hyperbaric oxygen by the [place]. His chest x-ray I think shows vascular crowding but no pneumothorax. He has been using Iodoflex on the wound with  reasonably good improvement this week. He sees his ENT in 2 days and then they will make Billy decision about when they want to start HBO 10/24; patient is started HBO I believe he is on treatment 5 today. He had some problems with his right ear on the second dive however he has had no problems since and says he feels well. He slept through treatment yesterday. He has been using Iodosorb ointment on the wounds on both heels for Billy long period even before he came here. Unfortunately the wound surface still does not look completely as viable as I would like. 11/5; he continues to tolerate HBO fairly well although he is having some fullness in his left ear that is reversed by popping his left ear. He has been using Sorbact hydrogel. He comes in today with the wounds quite Billy bit smaller especially on the right but also on the left. He is fairly rigorous and offloading these with heel offloading shoes. 11/11; right posterior calcaneus is closed down. The area on the left calcaneus laterally still has some depth. We have been using Sorbact on both sides. He has completed 12 out of 40 treatments 11/19; right posterior calcaneus is still closed the area on the left is quite Billy bit smaller. We have been using Sorbact on the left heel. He is still receiving HBO which was requested by the [place]. These wounds may be healed soon Electronic Signature(s) Signed: 04/09/2023 4:19:19 PM By: Billy Najjar MD Entered By: [name] on 04/09/2023 09:54:27 -------------------------------------------------------------------------------- Physical Exam Details Patient Name: Date of Service: [name] [name], [name] 04/09/2023 12:30 PM Medical Record Number: [id number] Patient Account Number: [id number] Date of Birth/Sex: Treating RN: 1945-07-25 (77 y.o. M) Primary  Care Provider: [name] Other Clinician: Referring Provider: Treating Provider/Extender: [name], [place] Cerise in Treatment:  6 Constitutional Sitting or standing Blood Pressure is within target range for patient.. Pulse regular and within target range for patient.Marland Kitchen Respirations regular, non-labored and within target range.. Temperature is normal and within the target range for the patient.Marland Kitchen Appears in no distress. MARSALIS, GIULIANI ([id number]) [id number] Page 2 of 7 Notes Wound exam; the areas on the left heel much smaller. No debridement is necessary. Small divot epithelializing. On the right everything else remains closed. There is no evidence of surrounding infection Electronic Signature(s) Signed: 04/09/2023 4:19:19 PM By: Billy Najjar MD Entered By: [name] on 04/09/2023 09:55:19 -------------------------------------------------------------------------------- Physician Orders Details Patient Name: Date of Service: [name], [name] 04/09/2023 12:30 PM Medical Record Number: [id number] Patient Account Number: [id number] Date of Birth/Sex: Treating RN: 1945/10/30 (77 y.o. 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 - Dr. Leanord Hawking 04/23/2023 1230 (already scheduled) Return Billy Return appointment in 3 weeks. - 10DrLeanord Hawking 12//2024 1215  (already scheduled) Other: - ****Will skip wound care appt week of Thanksgiving. Will continue Hyperbarics.**** Once wounds close will discontinue hyperbarics. Wear shoe to right foot for protection and keep cover for protection 1-2 weeks. Anesthetic (In clinic) Topical Lidocaine 4% applied to wound bed - In clinic Bathing/ Shower/ Hygiene May shower with protection but do not get wound dressing(s) wet. Protect dressing(s) with water repellant cover (for example, large plastic bag) or Billy cast cover and may then take shower. Off-Loading Wound #1 Left,Posterior Calcaneus Wedge shoe to: - Off loading shoes bilaterally to alleviate pressure from heels. Turn and reposition every 2 hours Prevalon Boot - bilaterally at bedtime Hyperbaric Oxygen Therapy Wound #1 Left,Posterior Calcaneus Evaluate for HBO Therapy - Per patient, he has claustrophobia. Evaluate for HBO Therapy - Per patient, he has claustrophobia. If appropriate for treatment, begin HBOT per protocol: 2.0 ATA for 90 Minutes without Billy Breaks ir 2.0 ATA for 90 Minutes without Billy Breaks ir Total Number of Treatments: - 40 One treatments per day (delivered Monday through Friday unless otherwise specified in Special Instructions below): One treatments per day (delivered Monday through Friday unless otherwise specified in Special Instructions below): Finger stick Blood Glucose Pre- and Post- HBOT Treatment. Finger stick Blood Glucose Pre- and Post- HBOT Treatment. Follow Hyperbaric Oxygen Glycemia Protocol Follow Hyperbaric Oxygen Glycemia Protocol Give two 4oz orange juices in addition to Glucerna when the glycemic protocol is used. Give two 4oz orange juices in addition to Glucerna when the glycemic protocol is used. Billy frin (Oxymetazoline HCL) 0.05% nasal spray - 1 spray in both nostrils daily as needed prior to HBO treatment for difficulty clearing ears Billy frin (Oxymetazoline HCL) 0.05% nasal spray - 1 spray in both nostrils daily as  needed prior to HBO treatment for difficulty clearing ears DVID, BEDI ([id number]) [phone].pdf Page 3 of 7 Wound Treatment Wound #  1 - Calcaneus Wound Laterality: Left, Posterior Cleanser: Wound Cleanser Every Other Day/30 Days Discharge Instructions: Cleanse the wound with wound cleanser prior to applying Billy clean dressing using gauze sponges, not tissue or cotton balls. Peri-Wound Care: Skin Prep Every Other Day/30 Days Discharge Instructions: Use skin prep as directed Topical: Skintegrity Hydrogel 4 (oz) Every Other Day/30 Days Discharge Instructions: Apply hydrogel as directed Prim Dressing: Cutimed Sorbact 1.5 inx23/8 in Every Other Day/30 Days ary Discharge Instructions: with Hydrogel into the wound bed Secondary Dressing: Zetuvit Plus Silicone Border Dressing 3x3 (in/in) Every Other Day/30 Days Discharge Instructions: Apply silicone border over primary dressing as directed. GLYCEMIA INTERVENTIONS PROTOCOL PRE-HBO GLYCEMIA INTERVENTIONS ACTION INTERVENTION Obtain pre-HBO capillary blood glucose (ensure 1 physician order is in chart). Billy. Notify HBO physician and await physician orders. 2 If result is 70 mg/dl or below: B. If the result meets the hospital definition of Billy critical result, follow hospital policy. Billy. Give patient an 8 ounce Glucerna Shake, an 8 ounce Ensure, or 8 ounces of Billy Glucerna/Ensure equivalent dietary supplement*. B. Wait 30 minutes. If result is 71 mg/dl to 469 mg/dl: C. Retest patients capillary blood glucose (CBG). D. If result greater than or equal to 110 mg/dl, proceed with HBO. If result less than 110 mg/dl, notify HBO physician and consider holding HBO. If result is 131 mg/dl to 629 mg/dl: Billy. Proceed with HBO. Billy. Notify HBO physician and await physician orders. B. It is recommended to hold HBO and do If result is 250 mg/dl or greater: blood/urine ketone testing. C. If the result meets the hospital definition of  Billy critical result, follow hospital policy. POST-HBO GLYCEMIA INTERVENTIONS ACTION INTERVENTION Obtain post HBO capillary blood glucose (ensure 1 physician order is in chart). Billy. Notify HBO physician and await physician orders. 2 If result is 70 mg/dl or below: B. If the result meets the hospital definition of Billy critical result, follow hospital policy. Billy. Give patient an 8 ounce Glucerna Shake, an 8 ounce Ensure, or 8 ounces of Billy Glucerna/Ensure equivalent dietary supplement*. B. Wait 15 minutes for symptoms of If result is 71 mg/dl to 528 mg/dl: hypoglycemia (i.e. nervousness, anxiety, sweating, chills, clamminess, irritability, confusion, tachycardia or dizziness). C. If patient asymptomatic, discharge patient. If patient symptomatic, repeat capillary blood glucose (CBG) and notify HBO physician. If result is 101 mg/dl to 413 mg/dl: Billy. Discharge patient. Billy. Notify HBO physician and await physician orders. B. It is recommended to do blood/urine ketone If result is 250 mg/dl or greater: testing. C. If the result meets the hospital definition of Billy critical result, follow hospital policy. *Juice or candies are NOT equivalent products. If patient refuses the Glucerna or Ensure, please consult the hospital dietitian for an appropriate substitute. Electronic Signature(s) Signed: 04/09/2023 4:19:19 PM By: Billy Najjar MD Signed: 04/09/2023 4:47:01 PM By: Shawn Stall RN, BSN Entered By: Shawn Stall on 04/09/2023 09:49:15 [name] ([id number]) [id number] Page 4 of 7 -------------------------------------------------------------------------------- Problem List Details Patient Name: Date of Service: [name], [name] 04/09/2023 12:30 PM Medical Record Number: [id number] Patient Account Number: [id number] Date of Birth/Sex: Treating RN: 10/07/1945 (77 y.o. 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 with other specified 02/26/2023 No Yes severity E11.42 Type  2 diabetes mellitus with diabetic polyneuropathy 02/26/2023 No Yes E11.51 Type 2 diabetes mellitus with diabetic peripheral angiopathy without gangrene 02/26/2023 No Yes Inactive Problems ICD-10 Code Description Active Date Inactive Date 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 04/09/2023 12:30 PM Medical Record Number: [id number] Patient Account Number: [id number] Date of Birth/Sex: Treating RN: 06/16/1945 (77 y.o. M) Primary Care Provider: [name] Other Clinician: Referring Provider: Treating Provider/Extender: [name], [place] [facility] in Treatment: 6 Subjective History of Present Illness (HPI) ADMISSION 02/26/2023 YASH, LASSMAN ([id number]) [phone].pdf Page 5 of 7 Patient is Billy 77 year old man with type 2 diabetes. He has had Billy 18-month history of wounds on his bilateral heels just above the heel tips. They have been using Iodoflex/Iodosorb ointment. The patient has Billy history of PAD and has Billy SFA stent on the right. He also also status post left iliac stent. Furthermore he has Billy history of carotid artery stenosis status post right carotid endarterectomy and Billy left subclavian artery  stenosis. His wound started on his blisters on the heel that of open to wounds with minimal but some depth right greater than left. He also has advanced diabetic neuropathy. The patient is cared for through the [place] and has been referred here for hyperbaric oxygen treatment through his vascular surgeon. Past medical history includes hypertension, hyperlipidemia, type 2 diabetes chronic kidney disease stage IIIb, carotid artery stenosis status post right carotid endarterectomy, left subclavian artery stenosis ABIs done in our clinic were 1.11 on the right and 0.75 on the left these were remarkably similar to what was quoted during the vascular surgery appointment from 02/05/2023. 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 He was referred for hyperbaric oxygen by the [place]. His chest x-ray I think shows vascular crowding but no pneumothorax. He has been using Iodoflex on the wound with reasonably good improvement this week. He sees his ENT in 2 days and then they will make Billy decision about when they want to start HBO 10/24; patient is started HBO I believe he is on treatment 5 today. He had some problems with his right ear on the second dive however he has had no problems since and says he feels well. He slept through treatment yesterday. He has been using Iodosorb ointment on the wounds on both heels for Billy long period even before he came here. Unfortunately the wound surface still does not look completely as viable as I would like. 11/5; he continues to tolerate HBO fairly well although he is having some fullness in his left ear that is reversed by popping his left ear. He has been using Sorbact hydrogel. He comes in today with the wounds quite Billy bit smaller especially on the right but also on the left. He is fairly rigorous and offloading these with heel offloading shoes. 11/11; right posterior calcaneus is closed down. The area on the left calcaneus  laterally still has some depth. We have been using Sorbact on both sides. He has completed 12 out of 40 treatments 11/19; right posterior calcaneus is still closed the area on the left is quite Billy bit smaller. We have been using Sorbact on the left heel. He is still receiving HBO  which was requested by the [place]. These wounds may be healed soon Objective Constitutional Sitting or standing Blood Pressure is within target range for patient.. Pulse regular and within target range for patient.Marland Kitchen Respirations regular, non-labored and within target range.. Temperature is normal and within the target range for the patient.Marland Kitchen Appears in no distress. Vitals Time Taken: 12:40 PM, Height: 70 in, Weight: 196 lbs, BMI: 28.1, Temperature: 98.6 F, Pulse: 60 bpm, Respiratory Rate: 18 breaths/min, Blood Pressure: 141/62 mmHg. General Notes: Wound exam; the areas on the left heel much smaller. No debridement is necessary. Small divot epithelializing. On the right everything else remains closed. There is no evidence of surrounding infection Integumentary (Hair, Skin) Wound #1 status is Open. Original cause of wound was Blister. The date acquired was: 01/20/2023. The wound has been in treatment 6 weeks. The wound is located on the Left,Posterior Calcaneus. The wound measures 1cm length x 0.4cm width x 0.4cm depth; 0.314cm^2 area and 0.126cm^3 volume. There is Fat Layer (Subcutaneous Tissue) exposed. There is no tunneling or undermining noted. There is Billy medium amount of serosanguineous drainage noted. The wound margin is distinct with the outline attached to the wound base. There is large (67-100%) red, pink granulation within the wound bed. There is Billy small (1-33%) amount of necrotic tissue within the wound bed including Adherent Slough. The periwound skin appearance had no abnormalities noted for texture. The periwound skin appearance had no abnormalities noted for color. The periwound skin appearance did not exhibit:  Dry/Scaly. 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 - Dr. Leanord Hawking 04/23/2023 1230 (already scheduled) Return appointment in 3 weeks. - 10DrLeanord Hawking 12//2024 1215 (already scheduled) Other: - ****Will skip wound care appt week of Thanksgiving. Will continue Hyperbarics.**** Once wounds close will discontinue hyperbarics. Wear shoe to right foot for protection and keep cover for protection 1-2 weeks. Anesthetic: MAKHAI, OUTING ([id number]) [id number] Page 6 of 7 (In clinic) Topical Lidocaine 4% applied to wound bed - In clinic Bathing/ Shower/ Hygiene: May shower with protection but do not get wound dressing(s) wet. Protect dressing(s) with water repellant cover (for example, large plastic bag) or Billy cast cover and may then take shower. Off-Loading: Wound #1 Left,Posterior Calcaneus: Wedge shoe to: - Off loading shoes bilaterally to alleviate pressure from heels. Turn and reposition every 2 hours Prevalon Boot - bilaterally at bedtime Hyperbaric Oxygen Therapy: Wound #1 Left,Posterior Calcaneus: Evaluate for HBO Therapy - Per patient, he has claustrophobia. Evaluate for HBO Therapy - Per patient, he has claustrophobia. If appropriate for treatment, begin HBOT per protocol: 2.0 ATA for 90 Minutes without Air Breaks 2.0 ATA for 90 Minutes without Air Breaks T Number of Treatments: - 40 otal One treatments per day (delivered Monday through Friday unless otherwise specified in Special Instructions below): One treatments per day (delivered Monday through Friday unless otherwise specified in Special Instructions below): Finger stick Blood Glucose Pre- and  Post- HBOT Treatment. Finger stick Blood Glucose Pre- and Post- HBOT Treatment. Follow Hyperbaric Oxygen Glycemia Protocol Follow Hyperbaric Oxygen Glycemia Protocol Give two 4oz orange juices in addition to Glucerna when the glycemic protocol is used. Give two 4oz orange juices in addition to Glucerna when the glycemic protocol is used. Afrin (Oxymetazoline HCL) 0.05% nasal spray - 1 spray  in both nostrils daily as needed prior to HBO treatment for difficulty clearing ears Afrin (Oxymetazoline HCL) 0.05% nasal spray - 1 spray in both nostrils daily as needed prior to HBO treatment for difficulty clearing ears WOUND #1: - Calcaneus Wound Laterality: Left, Posterior Cleanser: Wound Cleanser Every Other Day/30 Days Discharge Instructions: Cleanse the wound with wound cleanser prior to applying Billy clean dressing using gauze sponges, not tissue or cotton balls. Peri-Wound Care: Skin Prep Every Other Day/30 Days Discharge Instructions: Use skin prep as directed Topical: Skintegrity Hydrogel 4 (oz) Every Other Day/30 Days Discharge Instructions: Apply hydrogel as directed Prim Dressing: Cutimed Sorbact 1.5 inx23/8 in Every Other Day/30 Days ary Discharge Instructions: with Hydrogel into the wound bed Secondary Dressing: Zetuvit Plus Silicone Border Dressing 3x3 (in/in) Every Other Day/30 Days Discharge Instructions: Apply silicone border over primary dressing as directed. 1. We continued with the Sorbact based dressings. He is offloading this and heel off loader's and Prevalon boots at night. 2. These were Wagner grade 2 wounds. HBO was requested by the [place]. Hyperbarics can stop when the area on the left heel closes. Electronic Signature(s) Signed: 04/09/2023 4:19:19 PM By: Billy Najjar MD Entered By: [name] on 04/09/2023 09:56:35 -------------------------------------------------------------------------------- SuperBill Details Patient Name: Date of Service: [name], [name]  04/09/2023 Medical Record Number: [id number] Patient Account Number: [id number] Date of Birth/Sex: Treating RN: 1945/08/20 (77 y.o. 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 E11.42 Type 2 diabetes mellitus with diabetic polyneuropathy E11.51 Type 2 diabetes mellitus with diabetic peripheral angiopathy without gangrene Facility Procedures : DARRIC, TULLAR Code: [id number] DEONTAY DEIGNAN ([id number]) Description: 99213 - WOUND CARE VISIT-LEV 3 EST PT [phone] Modifier: [id number] Quantity: 1 f Page 7 of 7 Physician Procedures : CPT4 Code Description Modifier [phone] - WC PHYS LEVEL 2 - EST PT ICD-10 Diagnosis Description E11.621 Type 2 diabetes mellitus with foot ulcer 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

## 2023-04-09 NOTE — Progress Notes (Addendum)
[name], [name] ([id number]) [id number] Page 1 of 2 Visit Report for 04/09/2023 Arrival Information Details Patient Name: Date of Service: [name], [name] 04/09/2023 1:15 PM Medical Record Number: [id number] Patient Account Number: [id number] Date of Birth/Sex: Treating RN: Jul 24, 1945 (77 y.o. [name] Primary Care [name]: Clinic, [name] Other Clinician: [name] Referring [name]: Treating [name]/Extender: [name] [facility], [name] in Treatment: 6 Visit Information History Since Last Visit All ordered tests and consults were completed: Yes Patient Arrived: [name] Added or deleted any medications: No Arrival Time: 12:55 Any new allergies or adverse reactions: No Accompanied By: Wife Had Billy fall or experienced change in No Transfer Assistance: None activities of daily living that may affect Patient Identification Verified: Yes risk of falls: Secondary Verification Process Completed: Yes Signs or symptoms of abuse/neglect since last visito No Patient Requires Transmission-Based Precautions: No Hospitalized since last visit: No Patient Has Alerts: No Implantable device outside of the clinic excluding No cellular tissue based products placed in the center since last visit: Pain Present Now: No Electronic Signature(s) Signed: 04/09/2023 2:12:16 PM By: [name] [name] Entered By: [name] on 04/09/2023 11:12:16 -------------------------------------------------------------------------------- Encounter Discharge Information Details Patient Name: Date of Service: [name], [name] 04/09/2023 1:15 PM Medical Record Number: [id number] Patient Account Number: [id number] Date of Birth/Sex: Treating RN: 1946/02/10 (77 y.o. [name] Primary Care [name]: Clinic, [name] Other Clinician: [name] Referring Tymere Depuy: Treating [name]/Extender: [name] [facility],  [name] in Treatment: 6 Encounter Discharge Information Items Discharge Condition: Stable Ambulatory Status: Walker Discharge Destination: Home Transportation: Private Auto Accompanied By: None Schedule Follow-up Appointment: Yes Clinical Summary of Care: Electronic Signature(s) Signed: 04/09/2023 3:22:53 PM By: [name] [name] Entered By: [name] on 04/09/2023 12:22:53 [name] ([id number]) [id number] Page 2 of 2 -------------------------------------------------------------------------------- Vitals Details Patient Name: Date of Service: [name], [name] 04/09/2023 1:15 PM Medical Record Number: [id number] Patient Account Number: [id number] Date of Birth/Sex: Treating RN: 22-Oct-1945 (77 y.o. [name] Primary Care [name]: Clinic, [name] Other Clinician: [name] Referring [name]: Treating [name]/Extender: [name] [facility], [name] in Treatment: 6 Vital Signs Time Taken: 12:40 Temperature (F): 98.6 Height (in): 70 Pulse (bpm): 60 Weight (lbs): 196 Respiratory Rate (breaths/min): 18 Body Mass Index (BMI): 28.1 Blood Pressure (mmHg): 141/62 Reference Range: 80 - 120 mg / dl Electronic Signature(s) Signed: 04/09/2023 2:15:04 PM By: [name] [name] Entered By: [name] on 04/09/2023 11:15:03

## 2023-04-09 NOTE — Progress Notes (Signed)
Billy, [name] ([id number]) [id number] Page 1 of 9 Visit Report for 04/09/2023 Arrival Information Details Patient Name: Date of Service: [name], [name] 04/09/2023 12:30 PM Medical Record Number: [id number] Patient Account Number: [id number] Date of Birth/Sex: Treating RN: 07/18/1945 (77 y.o. [name] Primary Care Carel Schnee: [facility], [name] Other Clinician: Referring Anapaula Severt: Treating Baer Hinton/Extender: [name], [place] Cerise in Treatment: 6 Visit Information History Since Last Visit Added or deleted any medications: No Patient Arrived: [name] Any new allergies or adverse reactions: No Arrival Time: 12:39 Had a fall or experienced change in No Accompanied By: wife activities of daily living that may affect Transfer Assistance: None risk of falls: Patient Identification Verified: Yes Signs or symptoms of abuse/neglect since No Secondary Verification Process Completed: Yes last visito Patient Requires Transmission-Based Precautions: No Hospitalized since last visit: No Patient Has Alerts: No Implantable device outside of the clinic No excluding cellular tissue based products placed in the center since last visit: Has Dressing in Place as Prescribed: Yes Has Footwear/Offloading in Place as Yes Prescribed: Left: Surgical Shoe with Pressure Relief Insole Right: Surgical Shoe with Pressure Relief Insole Pain Present Now: No Electronic Signature(s) Signed: 04/09/2023 4:47:01 PM By: Shawn Stall RN, BSN Entered By: Shawn Stall on 04/09/2023 09:40:02 -------------------------------------------------------------------------------- Clinic Level of Care Assessment Details Patient Name: Date of Service: [name], [name] 04/09/2023 12:30 PM Medical Record Number: [id number] Patient Account Number: [id number] Date of Birth/Sex: Treating RN: 01/09/1946 (77 y.o. [name] Primary Care [name]: Clinic,  [name] Other Clinician: Referring Lonzell Dorris: Treating Darrol Brandenburg/Extender: [name], [place] Cerise in Treatment: 6 Clinic Level of Care Assessment Items TOOL 4 Quantity Score X- 1 0 Use when only an EandM is performed on FOLLOW-UP visit ASSESSMENTS - Nursing Assessment / Reassessment X- 1 10 Reassessment of Co-morbidities (includes updates in patient status) X- 1 5 Reassessment of Adherence to Treatment Plan ASSESSMENTS - Wound and Skin A ssessment / Reassessment X - Simple Wound Assessment / Reassessment - one wound 1 5 []  - 0 Complex Wound Assessment / Reassessment - multiple wounds []  - 0 Dermatologic / Skin Assessment (not related to wound area) COMER, Billy ([id number]) [id number] Page 2 of 9 ASSESSMENTS - Focused Assessment X- 1 5 Circumferential Edema Measurements - multi extremities []  - 0 Nutritional Assessment / Counseling / Intervention []  - 0 Lower Extremity Assessment (monofilament, tuning fork, pulses) []  - 0 Peripheral Arterial Disease Assessment (using hand held doppler) ASSESSMENTS - Ostomy and/or Continence Assessment and Care []  - 0 Incontinence Assessment and Management []  - 0 Ostomy Care Assessment and Management (repouching, etc.) PROCESS - Coordination of Care X - Simple Patient / Family Education for ongoing care 1 15 []  - 0 Complex (extensive) Patient / Family Education for ongoing care X- 1 10 Staff obtains Chiropractor, Records, T Results / Process Orders est []  - 0 Staff telephones HHA, Nursing Homes / Clarify orders / etc []  - 0 Routine Transfer to another Facility (non-emergent condition) []  - 0 Routine Hospital Admission (non-emergent condition) []  - 0 New Admissions / Manufacturing engineer / Ordering NPWT Apligraf, etc. , []  - 0 Emergency Hospital Admission (emergent condition) X- 1 10 Simple Discharge Coordination []  - 0 Complex (extensive) Discharge Coordination PROCESS -  Special Needs []  - 0 Pediatric / Minor Patient Management []  - 0 Isolation Patient Management []  - 0 Hearing / Language / Visual special needs []  - 0 Assessment of Community assistance (transportation, D/C planning, etc.) []  - 0 Additional  assistance / Altered mentation []  - 0 Support Surface(s) Assessment (bed, cushion, seat, etc.) INTERVENTIONS - Wound Cleansing / Measurement X - Simple Wound Cleansing - one wound 1 5 []  - 0 Complex Wound Cleansing - multiple wounds X- 1 5 Wound Imaging (photographs - any number of wounds) []  - 0 Wound Tracing (instead of photographs) X- 1 5 Simple Wound Measurement - one wound []  - 0 Complex Wound Measurement - multiple wounds INTERVENTIONS - Wound Dressings X - Small Wound Dressing one or multiple wounds 1 10 []  - 0 Medium Wound Dressing one or multiple wounds []  - 0 Large Wound Dressing one or multiple wounds []  - 0 Application of Medications - topical []  - 0 Application of Medications - injection INTERVENTIONS - Miscellaneous []  - 0 External ear exam []  - 0 Specimen Collection (cultures, biopsies, blood, body fluids, etc.) []  - 0 Specimen(s) / Culture(s) sent or taken to Lab for analysis []  - 0 Patient Transfer (multiple staff / [employer] / Similar devices) Billy, [name] ([id number]) [id number] Page 3 of 9 []  - 0 Simple Staple / Suture removal (25 or less) []  - 0 Complex Staple / Suture removal (26 or more) []  - 0 Hypo / Hyperglycemic Management (close monitor of Blood Glucose) []  - 0 Ankle / Brachial Index (ABI) - do not check if billed separately X- 1 5 Vital Signs Has the patient been seen at the hospital within the last three years: Yes Total Score: 90 Level Of Care: New/Established - Level 3 Electronic Signature(s) Signed: 04/09/2023 4:47:01 PM By: Shawn Stall RN, BSN Entered By: Shawn Stall on 04/09/2023  09:49:45 -------------------------------------------------------------------------------- Encounter Discharge Information Details Patient Name: Date of Service: [name], [name] 04/09/2023 12:30 PM Medical Record Number: [id number] Patient Account Number: [id number] Date of Birth/Sex: Treating RN: 04-Jan-1946 (77 y.o. [name] Primary Care Virgle Arth: [facility], [name] Other Clinician: Referring Shaindel Sweeten: Treating Ulys Favia/Extender: [name], [place] Cerise in Treatment: 6 Encounter Discharge Information Items Discharge Condition: Stable Ambulatory Status: Walker Discharge Destination: Home Transportation: Private Auto Accompanied By: wife Schedule Follow-up Appointment: Yes Clinical Summary of Care: Electronic Signature(s) Signed: 04/09/2023 4:47:01 PM By: Shawn Stall RN, BSN Entered By: Shawn Stall on 04/09/2023 09:50:46 -------------------------------------------------------------------------------- Lower Extremity Assessment Details Patient Name: Date of Service: [name], [name] 04/09/2023 12:30 PM Medical Record Number: [id number] Patient Account Number: [id number] Date of Birth/Sex: Treating RN: 05/20/1946 (77 y.o. [name] Primary Care Rosezetta Balderston: [facility], [name] Other Clinician: Referring Chaysen Tillman: Treating Margene Cherian/Extender: Leota Jacobsen Weeks in Treatment: 6 Edema Assessment Assessed: [Left: Yes] [Right: No] Edema: [Left: Ye] [Right: s] Calf Left: Right: Point of Measurement: From Medial Instep 32 cm Ankle TERRYION, JAYARAMAN ([id number]) [id number] Page 4 of 9 Left: Right: Point of Measurement: From Medial Instep 22 cm Vascular Assessment Pulses: Dorsalis Pedis Palpable: [Left:Yes] Extremity colors, hair growth, and conditions: Extremity Color: [Left:Normal] Hair Growth on Extremity: [Left:No] Temperature of Extremity: [Left:Warm] Capillary Refill: [Left:< 3  seconds] Dependent Rubor: [Left:No] Blanched when Elevated: [Left:No No] Toe Nail Assessment Left: Right: Thick: No Discolored: No Deformed: No Improper Length and Hygiene: No Electronic Signature(s) Signed: 04/09/2023 4:47:01 PM By: Shawn Stall RN, BSN Entered By: Shawn Stall on 04/09/2023 09:40:24 -------------------------------------------------------------------------------- Multi Wound Chart Details Patient Name: Date of Service: [name], [name] 04/09/2023 12:30 PM Medical Record Number: [id number] Patient Account Number: [id number] Date of Birth/Sex: Treating RN: 03-Aug-1945 (77 y.o. M) Primary Care Briell Paulette: [facility], [name] Other Clinician: Referring Karyna Bessler: Treating  Marchia Diguglielmo/Extender: [name], [place] Cerise in Treatment: 6 Vital Signs Height(in): 70 [name](bpm): 60 Weight(lbs): 196 Blood Pressure(mmHg): 141/62 Body Mass Index(BMI): 28.1 Temperature(F): 98.6 Respiratory Rate(breaths/min): 18 [1:Photos:] [N/A:N/A] Left, Posterior Calcaneus N/A N/A Wound Location: Blister N/A N/A Wounding Event: Diabetic Wound/Ulcer of the Lower N/A N/A Primary Etiology: Extremity Arrhythmia, Coronary Artery Disease, N/A N/A Comorbid History: Hypertension, Peripheral Venous Disease, Type II Diabetes, Osteoarthritis, Neuropathy 01/20/2023 N/A N/A Date Acquired: 6 N/A N/A Weeks of Treatment: Open N/A N/A Wound Status: ASHLAN, TARA ([id number]) [id number] Page 5 of 9 No N/A N/A Wound Recurrence: 1x0.4x0.4 N/A N/A Measurements L x W x D (cm) 0.314 N/A N/A A (cm) : rea 0.126 N/A N/A Volume (cm) : 81.50% N/A N/A % Reduction in A rea: 62.80% N/A N/A % Reduction in Volume: Grade 3 N/A N/A Classification: Medium N/A N/A Exudate A mount: Serosanguineous N/A N/A Exudate Type: red, brown N/A N/A Exudate Color: Distinct, outline attached N/A N/A Wound Margin: Large (67-100%) N/A N/A Granulation A  mount: Red, Pink N/A N/A Granulation Quality: Small (1-33%) N/A N/A Necrotic A mount: Fat Layer (Subcutaneous Tissue): Yes N/A N/A Exposed Structures: Fascia: No Tendon: No Muscle: No Joint: No Bone: No Medium (34-66%) N/A N/A Epithelialization: No Abnormalities Noted N/A N/A Periwound Skin Texture: Dry/Scaly: No N/A N/A Periwound Skin Moisture: No Abnormalities Noted N/A N/A Periwound Skin Color: Treatment Notes Wound #1 (Calcaneus) Wound Laterality: Left, Posterior Cleanser Wound Cleanser Discharge Instruction: Cleanse the wound with wound cleanser prior to applying a clean dressing using gauze sponges, not tissue or cotton balls. Peri-Wound Care Skin Prep Discharge Instruction: Use skin prep as directed Topical Skintegrity Hydrogel 4 (oz) Discharge Instruction: Apply hydrogel as directed Primary Dressing Cutimed Sorbact 1.5 inx23/8 in Discharge Instruction: with Hydrogel into the wound bed Secondary Dressing Zetuvit Plus Silicone Border Dressing 3x3 (in/in) Discharge Instruction: Apply silicone border over primary dressing as directed. Secured With Compression Wrap Compression Stockings Facilities manager) Signed: 04/09/2023 4:19:19 PM By: [name] MD Entered By: [name] on 04/09/2023 09:53:19 -------------------------------------------------------------------------------- Multi-Disciplinary Care Plan Details Patient Name: Date of Service: [name], [name] 04/09/2023 12:30 PM Medical Record Number: [id number] Patient Account Number: [id number] Date of Birth/Sex: Treating RN: [birth year] (77 y.o. [name] Primary Care Kammie Scioli: [facility], [name] Other Clinician: Referring Gloriana Piltz: Treating Jahkeem Kurka/Extender: Kathaleen Grinder in Treatment: [address], [place] S ([id number]) [id number] Page 6 of 9 Active Inactive Wound/Skin Impairment Nursing Diagnoses: Knowledge  deficit related to smoking impact on wound healing Goals: Patient/caregiver will verbalize understanding of skin care regimen Date Initiated: 02/26/2023 Target Resolution Date: 05/17/2023 Goal Status: Active Interventions: Assess patient/caregiver ability to obtain necessary supplies Assess patient/caregiver ability to perform ulcer/skin care regimen upon admission and as needed Assess ulceration(s) every visit Provide education on ulcer and skin care Screen for HBO Treatment Activities: Consult for HBO : 02/26/2023 Skin care regimen initiated : 02/26/2023 Topical wound management initiated : 02/26/2023 Notes: Electronic Signature(s) Signed: 04/09/2023 4:47:01 PM By: Shawn Stall RN, BSN Entered By: Shawn Stall on 04/09/2023 09:44:49 -------------------------------------------------------------------------------- Pain Assessment Details Patient Name: Date of Service: [name], [name] 04/09/2023 12:30 PM Medical Record Number: [id number] Patient Account Number: [id number] Date of Birth/Sex: Treating RN: Apr 12, 1946 (77 y.o. [name] Primary Care Jove Beyl: [facility], [name] Other Clinician: Referring Boneta Standre: Treating Aldridge Krzyzanowski/Extender: Kathaleen Grinder in Treatment: 6 Active Problems Location of Pain Severity and Description of Pain Patient Has Paino No Site Locations Pain Management and Medication Current  Pain Management: [facility], MCVEIGH ([id number]) [id number] Page 7 of 9 Electronic Signature(s) Signed: 04/09/2023 4:47:01 PM By: Shawn Stall RN, BSN Entered By: Shawn Stall on 04/09/2023 09:40:08 -------------------------------------------------------------------------------- Patient/Caregiver Education Details Patient Name: Date of Service: Apolinar Junes, [name] 11/19/2024andnbsp12:30 PM Medical Record Number: [id number] Patient Account Number: [id number] Date of Birth/Gender: Treating RN: 11-Oct-1945 (77  y.o. [name] Primary Care Physician: Clinic, [name] Other Clinician: Referring Physician: Treating Physician/Extender: [name], [place] Cerise in Treatment: 6 Education Assessment Education Provided To: Patient Education Topics Provided Wound/Skin Impairment: Handouts: Caring for Your Ulcer Methods: Explain/Verbal Responses: Reinforcements needed Electronic Signature(s) Signed: 04/09/2023 4:47:01 PM By: Shawn Stall RN, BSN Entered By: Shawn Stall on 04/09/2023 09:45:03 -------------------------------------------------------------------------------- Wound Assessment Details Patient Name: Date of Service: [name], [name] 04/09/2023 12:30 PM Medical Record Number: [id number] Patient Account Number: [id number] Date of Birth/Sex: Treating RN: 1945-12-16 (77 y.o. [name] Primary Care Kaeli Nichelson: [facility], [name] Other Clinician: Referring Marleny Faller: Treating Randal Yepiz/Extender: [name], [place] Cerise in Treatment: 6 Wound Status Wound Number: 1 Primary Diabetic Wound/Ulcer of the Lower Extremity Etiology: Wound Location: Left, Posterior Calcaneus Wound Open Wounding Event: Blister Status: Date Acquired: 01/20/2023 Comorbid Arrhythmia, Coronary Artery Disease, Hypertension, Peripheral Weeks Of Treatment: 6 History: Venous Disease, Type II Diabetes, Osteoarthritis, Neuropathy Clustered Wound: No Photos MILFRED, ZEINER ([id number]) [id number] Page 8 of 9 Wound Measurements Length: (cm) 1 Width: (cm) 0.4 Depth: (cm) 0.4 Area: (cm) 0.314 Volume: (cm) 0.126 % Reduction in Area: 81.5% % Reduction in Volume: 62.8% Epithelialization: Medium (34-66%) Tunneling: No Undermining: No Wound Description Classification: Grade 3 Wound Margin: Distinct, outline attached Exudate Amount: Medium Exudate Type: Serosanguineous Exudate Color: red, brown Foul Odor After Cleansing:  No Slough/Fibrino Yes Wound Bed Granulation Amount: Large (67-100%) Exposed Structure Granulation Quality: Red, Pink Fascia Exposed: No Necrotic Amount: Small (1-33%) Fat Layer (Subcutaneous Tissue) Exposed: Yes Necrotic Quality: Adherent Slough Tendon Exposed: No Muscle Exposed: No Joint Exposed: No Bone Exposed: No Periwound Skin Texture Texture Color No Abnormalities Noted: Yes No Abnormalities Noted: Yes Moisture No Abnormalities Noted: No Dry / Scaly: No Treatment Notes Wound #1 (Calcaneus) Wound Laterality: Left, Posterior Cleanser Wound Cleanser Discharge Instruction: Cleanse the wound with wound cleanser prior to applying a clean dressing using gauze sponges, not tissue or cotton balls. Peri-Wound Care Skin Prep Discharge Instruction: Use skin prep as directed Topical Skintegrity Hydrogel 4 (oz) Discharge Instruction: Apply hydrogel as directed Primary Dressing Cutimed Sorbact 1.5 inx23/8 in Discharge Instruction: with Hydrogel into the wound bed Secondary Dressing Zetuvit Plus Silicone Border Dressing 3x3 (in/in) Discharge Instruction: Apply silicone border over primary dressing as directed. Secured With [employer] Compression Stockings Add-Ons DANTAVIUS, BOTTA ([id number]) [phone] Page 9 of 9 Electronic Signature(s) Signed: 04/09/2023 4:47:01 PM By: Shawn Stall RN, BSN Entered By: Shawn Stall on 04/09/2023 09:44:14 -------------------------------------------------------------------------------- Vitals Details Patient Name: Date of Service: [name], [name] 04/09/2023 12:30 PM Medical Record Number: [id number] Patient Account Number: [id number] Date of Birth/Sex: Treating RN: December 04, 1945 (77 y.o. [name] Primary Care Musab Wingard: [facility], [name] Other Clinician: Referring Song Garris: Treating Pecolia Marando/Extender: [name], [place] in Treatment: 6 Vital Signs Time Taken:  12:40 Temperature (F): 98.6 Height (in): 70 [name] (bpm): 60 Weight (lbs): 196 Respiratory Rate (breaths/min): 18 Body Mass Index (BMI): 28.1 Blood Pressure (mmHg): 141/62 Reference Range: 80 - 120 mg / dl Electronic Signature(s) Signed: 04/09/2023 4:47:01 PM By: Shawn Stall RN, BSN Entered By: Shawn Stall on 04/09/2023 09:40:45

## 2023-04-09 NOTE — Progress Notes (Addendum)
[name], [name] ([id number]) [id number] Page 1 of 2 Visit Report for 04/09/2023 Problem List Details Patient Name: Date of Service: [name], [name] 04/09/2023 1:15 PM Medical Record Number: [id number] Patient Account Number: [id number] Date of Birth/Sex: Treating RN: 05/13/1946 (77 y.o. 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 02/26/2023 No Yes specified severity E11.42 Type 2 diabetes mellitus with diabetic polyneuropathy 02/26/2023 No Yes E11.51 Type 2 diabetes mellitus with diabetic peripheral angiopathy 02/26/2023 No Yes without gangrene Inactive Problems ICD-10 Code Description Active Date Inactive Date 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 04/09/2023 Medical Record Number: [id number] Patient Account Number: [id number] Date of Birth/Sex: Treating RN: [birth year] (77 y.o. [name] Primary Care Provider: [name] Other Clinician: Karl Bales Referring Provider: Treating Provider/Extender: Kathaleen Grinder in Treatment: [address] 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 E11.42 Type 2 diabetes mellitus with diabetic polyneuropathy E11.51 Type 2 diabetes mellitus with diabetic peripheral angiopathy without gangrene Facility Procedures : CPT4 Code Description: [id number] G0277-(Facility Use Only) HBOT full body chamber, , ICD-10 Diagnosis Description 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 with o L97.428  Non-pressure chronic ulcer of left heel and midfoot with ot E11.42 Type 2 diabetes mellitus with diabetic polyneuropathy Modifier: ther specified se her specified sev Quantity: 4 verity erity Physician Procedures : CPT4 Code Description Modifier [id number] - WC PHYS HYPERBARIC OXYGEN THERAPY ICD-10 Diagnosis Description 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

## 2023-04-10 ENCOUNTER — Encounter (HOSPITAL_BASED_OUTPATIENT_CLINIC_OR_DEPARTMENT_OTHER): Payer: No Typology Code available for payment source | Admitting: Internal Medicine

## 2023-04-10 DIAGNOSIS — E11621 Type 2 diabetes mellitus with foot ulcer: Secondary | ICD-10-CM | POA: Diagnosis not present

## 2023-04-10 LAB — GLUCOSE, CAPILLARY
Glucose-Capillary: 130 mg/dL — ABNORMAL HIGH (ref 70–99)
Glucose-Capillary: 173 mg/dL — ABNORMAL HIGH (ref 70–99)
Glucose-Capillary: 134 mg/dL — ABNORMAL HIGH (ref 70–99)

## 2023-04-10 NOTE — Progress Notes (Signed)
DONEL, MORDECAI ([id number]) [id number] Page 1 of 2 Visit Report for 04/10/2023 Arrival Information Details Patient Name: Date of Service: [name], [name] 04/10/2023 12:00 PM Medical Record Number: [id number] Patient Account Number: [id number] Date of Birth/Sex: Treating RN: 05-24-1945 (77 y.o. [name] Primary Care Le Faul[place]: [facility], [place] Other Clinician: Daryll Brod Referring Khalfani Weideman: Treating Seham Gardenhire/Extender: [name] [facility] [place] in Treatment: 6 Visit Information History Since Last Visit Added or deleted any medications: No Patient Arrived: [name] Any new allergies or adverse reactions: No Arrival Time: 14:19 Had Billy fall or experienced change in No Accompanied By: wife activities of daily living that may affect Transfer Assistance: None risk of falls: Patient Identification Verified: Yes Signs or symptoms of abuse/neglect since last visito No Secondary Verification Process Completed: Yes Hospitalized since last visit: No Patient Requires Transmission-Based Precautions: No Implantable device outside of the clinic excluding No Patient Has Alerts: No cellular tissue based products placed in the center since last visit: Pain Present Now: No Electronic Signature(s) Signed: 04/10/2023 2:42:57 PM By: Demetria Pore Entered By: Demetria Pore on 04/10/2023 11:27:51 -------------------------------------------------------------------------------- Encounter Discharge Information Details Patient Name: Date of Service: [name], [name] 04/10/2023 12:00 PM Medical Record Number: [id number] Patient Account Number: [id number] Date of Birth/Sex: Treating RN: [birth year] (77 y.o. [name] Primary Care Lavon Horn: [facility], [place] Other Clinician: Daryll Brod Referring Lupe Handley: Treating Cung Masterson/Extender: Kathaleen Grinder in Treatment: 6 Encounter Discharge Information  Items Discharge Condition: Stable Ambulatory Status: Ambulatory Discharge Destination: Home Transportation: Private Auto Accompanied By: wife Schedule Follow-up Appointment: Yes Clinical Summary of Care: Electronic Signature(s) Signed: 04/10/2023 2:42:57 PM By: Demetria Pore Entered By: Demetria Pore on 04/10/2023 11:37:53 [name] ([id number]) [id number] Page 2 of 2 -------------------------------------------------------------------------------- Vitals Details Patient Name: Date of Service: [name], [name] 04/10/2023 12:00 PM Medical Record Number: [id number] Patient Account Number: [id number] Date of Birth/Sex: Treating RN: Sep 13, 1945 (77 y.o. [name] Primary Care Yazhini Mcaulay: Clinic, [place] Other Clinician: Daryll Brod Referring Baily Serpe: Treating Valentine Barney/Extender: [name] [facility] [place] in Treatment: 6 Vital Signs Time Taken: 12:15 Temperature (F): 97.2 Height (in): 70 Pulse (bpm): 68 Weight (lbs): 196 Respiratory Rate (breaths/min): 16 Body Mass Index (BMI): 28.1 Blood Pressure (mmHg): 157/64 Capillary Blood Glucose (mg/dl): 742 Reference Range: 80 - 120 mg / dl Electronic Signature(s) Signed: 04/10/2023 2:42:57 PM By: Demetria Pore Entered By: Demetria Pore on 04/10/2023 11:42:21

## 2023-04-10 NOTE — Progress Notes (Signed)
[name], [name] ([id number]) [id number] Page 1 of 2 Visit Report for 04/10/2023 HBO Details Patient Name: Date of Service: [name], [name]. 04/10/2023 12:00 PM Medical Record Number: [id number] Patient Account Number: [id number] Date of Birth/Sex: Treating RN: [birth year] (77 y.o. [name] Primary Care Sherrye Puga: Clinic, [place] Other Clinician: Daryll Brod Referring Deolinda Frid: Treating Alisabeth Selkirk/Extender: Kathaleen Grinder in Treatment: 6 HBO Treatment Course Details Treatment Course Number: 1 Ordering Kaneshia Cater: [name] T Treatments Ordered: otal 40 HBO Treatment Start Date: 03/11/2023 HBO Indication: Diabetic Ulcer([name] of the Lower Extremity Standard/Conservative Wound Care tried and failed greater than or equal to 30 days Wound #1 Left, Posterior Calcaneus , Wound #2 Right, Posterior Calcaneus HBO Treatment Details Treatment Number: 19 Patient Type: Outpatient Chamber Type: Monoplace Chamber Serial #: [id number] Treatment Protocol: 2.0 ATA with 90 minutes oxygen, and no air breaks Treatment Details Compression Rate Down: 2.0 psi / minute De-Compression Rate Up: 1.5 psi / minute Air breaks and breathing Decompress Decompress Compress Tx Pressure Begins Reached periods Begins Ends (leave unused spaces blank) Chamber Pressure (ATA 1 2 ------2 1 ) Clock Time (24 hr) 12:20 12:29 - - - - - - 14:00 14:05 Treatment Length: 105 (minutes) Treatment Segments: 3 Vital Signs Capillary Blood Glucose Reference Range: 80 - 120 mg / dl HBO Diabetic Blood Glucose Intervention Range: <131 mg/dl or >607 mg/dl Time Vitals Blood Respiratory Capillary Blood Glucose Pulse Action Type: Pulse: Temperature: Taken: Pressure: Rate: Glucose (mg/dl): Meter #: Oximetry (%) Taken: Pre 12:15 157/64 68 16 97.2 173 1 Post 14:05 143/64 60 18 97.4 134 1 Treatment Response Treatment Toleration: Well Treatment Completion  Status: Treatment Completed without Adverse Event Treatment Notes I certify that I directed and performed operation of said chamber for this treatment. [place] [name] arrived with normal vital signs. He prepared for treatment. After performing Billy safety check, the patient was placed in the chamber which was compressed with 100% oxygen at the rate of 1.5 psi/min after confirming normal ear equalization. He tolerated the treatment and the subsequent decompression of the chamber at the rate of 1.5 psi/min. His post-treatment vital signs were within normal range. He was stable upon discharge. Additional Procedure Documentation Tissue [name] [name] Notes No concerns with treatment given Physician HBO Attestation: I certify that I supervised this HBO treatment in accordance with Medicare guidelines. Billy trained emergency response team is readily available per Yes [name], [name] ([id number]) [id number] Page 2 of 2 hospital policies and procedures. Continue HBOT as ordered. Yes Electronic Signature([name] Signed: 04/10/2023 4:37:54 PM By: [name] MD Previous Signature: 04/10/2023 4:00:31 PM Version By: [name] CHT EMT BS , , Previous Signature: 04/10/2023 2:42:57 PM Version By: Demetria Pore Entered By: [name] on 04/10/2023 13:36:35 -------------------------------------------------------------------------------- HBO Safety Checklist Details Patient Name: Date of Service: [name], [name]. 04/10/2023 12:00 PM Medical Record Number: [id number] Patient Account Number: [id number] Date of Birth/Sex: Treating RN: [birth year] (77 y.o. [name] Primary Care Autumnrose Yore: Clinic, [place] Other Clinician: Daryll Brod Referring Soni Kegel: Treating Odel Schmid/Extender: [name] [facility] [place] in Treatment: 6 HBO Safety Checklist Items Safety Checklist Consent Form Signed Patient voided / foley  secured and emptied 10:30 3 eggs 2 [place] sausage 3 pieces of When did you last eato bread Last dose of injectable or oral agent 10:00 Ostomy pouch emptied and vented if applicable NA All implantable devices assessed, documented and approved NA Intravenous access site secured and place NA Valuables secured  Linens and cotton and cotton/polyester blend (less than 51% polyester) Personal oil-based products / skin lotions / body lotions removed Wigs or hairpieces removed NA Smoking or tobacco materials removed NA Books / newspapers / magazines / loose paper removed Cologne, aftershave, perfume and deodorant removed Jewelry removed (may wrap wedding band) Make-up removed NA Hair care products removed Battery operated devices (external) removed NA Heating patches and chemical warmers removed NA Titanium eyewear removed NA Nail polish cured greater than 10 hours NA Casting material cured greater than 10 hours NA Hearing aids removed NA Loose dentures or partials removed Prosthetics have been removed NA Patient demonstrates correct use of air break device (if applicable) Patient concerns have been addressed Patient grounding bracelet on and cord attached to chamber Specifics for Inpatients (complete in addition to above) Medication sheet sent with patient NA Intravenous medications needed or due during therapy sent with patient NA Drainage tubes (e.g. nasogastric tube or chest tube secured and vented) NA Endotracheal or Tracheotomy tube secured NA Cuff deflated of air and inflated with saline NA Airway suctioned NA Electronic Signature([name] Signed: 04/10/2023 2:42:57 PM By: Demetria Pore Entered By: Demetria Pore on 04/10/2023 11:31:07

## 2023-04-10 NOTE — Progress Notes (Addendum)
PATTON, KOSS ([id number]) [id number] Page 1 of 1 Visit Report for 04/10/2023 SuperBill Details Patient Name: Date of Service: [name], [name] 04/10/2023 Medical Record Number: [id number] Patient Account Number: [id number] Date of Birth/Sex: Treating RN: 30-Mar-1946 (77 y.o. 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 E11.42 Type 2 diabetes mellitus with diabetic polyneuropathy E11.51 Type 2 diabetes mellitus with diabetic peripheral angiopathy without gangrene Facility Procedures CPT4 Code Description Modifier Quantity [id number] G0277-(Facility Use Only) HBOT full body chamber, , 3 ICD-10 Diagnosis Description 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 E11.42 Type 2 diabetes mellitus with diabetic polyneuropathy Physician Procedures Quantity CPT4 Code Description Modifier [id number] - WC PHYS HYPERBARIC OXYGEN THERAPY 1 ICD-10 Diagnosis Description 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

## 2023-04-11 ENCOUNTER — Encounter (HOSPITAL_BASED_OUTPATIENT_CLINIC_OR_DEPARTMENT_OTHER): Payer: No Typology Code available for payment source | Admitting: General Surgery

## 2023-04-11 DIAGNOSIS — E11621 Type 2 diabetes mellitus with foot ulcer: Secondary | ICD-10-CM | POA: Diagnosis not present

## 2023-04-11 LAB — GLUCOSE, CAPILLARY
Glucose-Capillary: 168 mg/dL — ABNORMAL HIGH (ref 70–99)
Glucose-Capillary: 136 mg/dL — ABNORMAL HIGH (ref 70–99)

## 2023-04-11 NOTE — Progress Notes (Signed)
TAYT, LOMBARDOZZI ([id number]) [id number] Page 1 of 2 Visit Report for 04/11/2023 Arrival Information Details Patient Name: Date of Service: [name], [name] 04/11/2023 12:00 PM Medical Record Number: [id number] Patient Account Number: [id number] Date of Birth/Sex: Treating RN: Apr 27, 1946 (77 y.o. [name] Primary Care Genevia Bouldin: [name] Other Clinician: [name] Referring Alyannah Sanks: Treating Romy Mcgue/Extender: [name] [facility] [place] in Treatment: 6 Visit Information History Since Last Visit All ordered tests and consults were completed: Yes Patient Arrived: [name] Added or deleted any medications: No Arrival Time: 11:42 Any new allergies or adverse reactions: No Accompanied By: self Had a fall or experienced change in No Transfer Assistance: Manual activities of daily living that may affect Patient Identification Verified: Yes risk of falls: Secondary Verification Process Completed: Yes Signs or symptoms of abuse/neglect since last visito No Patient Requires Transmission-Based Precautions: No Hospitalized since last visit: No Patient Has Alerts: No Implantable device outside of the clinic excluding No cellular tissue based products placed in the center since last visit: Pain Present Now: No Electronic Signature(s) Signed: 04/11/2023 1:42:27 PM By: [name] CHT EMT BS , , Entered By: [name] on 04/11/2023 10:42:27 -------------------------------------------------------------------------------- Encounter Discharge Information Details Patient Name: Date of Service: [name], [name] 04/11/2023 12:00 PM Medical Record Number: [id number] Patient Account Number: [id number] Date of Birth/Sex: Treating RN: 07/01/1945 (77 y.o. [name] Primary Care Waylen Depaolo: [name] Other Clinician: [name] Referring Lavoy Bernards: Treating Artez Regis/Extender: [name] [place] in Treatment: 6 Encounter Discharge Information Items Discharge Condition: Stable Ambulatory Status: Ambulatory Discharge Destination: Home Transportation: Private Auto Accompanied By: spouse Schedule Follow-up Appointment: No Clinical Summary of Care: Electronic Signature(s) Signed: 04/11/2023 2:11:04 PM By: [name] CHT EMT BS , , Entered By: [name] on 04/11/2023 11:11:04 [name] ([id number]) [id number] Page 2 of 2 -------------------------------------------------------------------------------- Vitals Details Patient Name: Date of Service: [name], [name] 04/11/2023 12:00 PM Medical Record Number: [id number] Patient Account Number: [id number] Date of Birth/Sex: Treating RN: February 24, 1946 (77 y.o. [name] Primary Care Seferina Brokaw: [name] Other Clinician: Daryll Brod Referring Sheccid Lahmann: Treating Aliea Bobe/Extender: [name] [place] in Treatment: 6 Vital Signs Time Taken: 11:43 Temperature (F): 97.3 Height (in): 70 Pulse (bpm): 66 Weight (lbs): 196 Respiratory Rate (breaths/min): 18 Body Mass Index (BMI): 28.1 Blood Pressure (mmHg): 132/55 Capillary Blood Glucose (mg/dl): 220 Reference Range: 80 - 120 mg / dl Electronic Signature(s) Signed: 04/11/2023 1:43:01 PM By: [name] CHT EMT BS , , Entered By: [name] on 04/11/2023 10:43:01

## 2023-04-11 NOTE — Progress Notes (Addendum)
[name], [name] ([id number]) [id number] Page 1 of 1 Visit Report for 04/11/2023 SuperBill Details Patient Name: Date of Service: A [name], [name] 04/11/2023 Medical Record Number: [id number] Patient Account Number: [id number] Date of Birth/Sex: Treating RN: 1945-07-11 (77 y.o. 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 E11.42 Type 2 diabetes mellitus with diabetic polyneuropathy E11.51 Type 2 diabetes mellitus with diabetic peripheral angiopathy without gangrene Facility Procedures CPT4 Code Description Modifier Quantity [id number] G0277-(Facility Use Only) HBOT full body chamber, , 4 ICD-10 Diagnosis Description 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 E11.42 Type 2 diabetes mellitus with diabetic polyneuropathy Physician Procedures Quantity CPT4 Code Description Modifier [id number] - WC PHYS HYPERBARIC OXYGEN THERAPY 1 ICD-10 Diagnosis Description 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

## 2023-04-11 NOTE — Progress Notes (Addendum)
[name], [name] ([id number]) [id number] Page 1 of 2 Visit Report for 04/11/2023 HBO Details Patient Name: Date of Service: [name], [name] 04/11/2023 12:00 PM Medical Record Number: [id number] Patient Account Number: [id number] Date of Birth/Sex: Treating RN: [birth year] (77 y.o. [name] Primary Care Himmat Enberg: [name] Other Clinician: [name] Referring Iliza Blankenbeckler: Treating Ramesha Poster/Extender: [name] [facility], [place] in Treatment: 6 HBO Treatment Course Details Treatment Course Number: 1 Ordering Elizaveta Mattice: [name] T Treatments Ordered: otal 40 HBO Treatment Start Date: 03/11/2023 HBO Indication: Diabetic Ulcer(s) of the Lower Extremity Standard/Conservative Wound Care tried and failed greater than or equal to 30 days Wound #1 Left, Posterior Calcaneus , Wound #2 Right, Posterior Calcaneus HBO Treatment Details Treatment Number: 20 Patient Type: Outpatient Chamber Type: Monoplace Chamber Serial #: [id number] Treatment Protocol: 2.0 ATA with 90 minutes oxygen, and no air breaks Treatment Details Compression Rate Down: 1.5 psi / minute De-Compression Rate Up: 2.0 psi / minute Air breaks and breathing Decompress Decompress Compress Tx Pressure Begins Reached periods Begins Ends (leave unused spaces blank) Chamber Pressure (ATA 1 2 ------2 1 ) Clock Time (24 hr) 11:57 12:08 - - - - - - 13:38 13:46 Treatment Length: 109 (minutes) Treatment Segments: 4 Vital Signs Capillary Blood Glucose Reference Range: 80 - 120 mg / dl HBO Diabetic Blood Glucose Intervention Range: <131 mg/dl or >865 mg/dl Type: Time Vitals Blood Respiratory Capillary Blood Glucose Pulse Action Pulse: Temperature: Taken: Pressure: Rate: Glucose (mg/dl): Meter #: Oximetry (%) Taken: Pre 11:43 132/55 66 18 97.3 168 none per protocol Post 13:49 139/76 67 18 97.9 136 none per protocol Treatment Response Treatment  Toleration: Well Treatment Completion Status: Treatment Completed without Adverse Event Treatment Notes [name] arrived with normal vital signs. He prepared for treatment. After performing Billy safety check, the patient was placed in the chamber which was compressed with 100% oxygen at the rate of 1.5 psi/min after confirming normal ear equalization. He tolerated the treatment and the subsequent decompression of the chamber at the rate of 1.5 psi/min. His post-treatment vital signs were within normal range. He was stable upon discharge. Additional Procedure Documentation Tissue [name] Physician HBO Attestation: I certify that I supervised this HBO treatment in accordance with Medicare guidelines. Billy trained emergency response team is readily available per Yes hospital policies and procedures. Continue HBOT as ordered. [address] [name], [name] ([id number]) [id number] Page 2 of 2 Electronic Signature(s) Signed: 04/11/2023 4:49:37 PM By: [name] MD FACS Previous Signature: 04/11/2023 1:59:24 PM Version By: [name] CHT EMT BS , , Entered By: [name] on 04/11/2023 13:49:37 -------------------------------------------------------------------------------- HBO Safety Checklist Details Patient Name: Date of Service: [name], [name] 04/11/2023 12:00 PM Medical Record Number: [id number] Patient Account Number: [id number] Date of Birth/Sex: Treating RN: January 23, 1946 (77 y.o. [name] Primary Care Buffie Herne: [name] Other Clinician: Daryll Brod Referring Howie Rufus: Treating Glena Pharris/Extender: [name] [facility], [place] in Treatment: 6 HBO Safety Checklist Items Safety Checklist Consent Form Signed Patient voided / foley secured and emptied When did you last eato 1007 Last dose of injectable or oral agent 0843 Ostomy pouch emptied and vented if applicable NA All implantable devices  assessed, documented and approved NA Intravenous access site secured and place NA Valuables secured Linens and cotton and cotton/polyester blend (less than 51% polyester) Personal oil-based products / skin lotions / body lotions removed Wigs or hairpieces removed NA Smoking or tobacco materials removed NA Books / newspapers /  magazines / loose paper removed Cologne, aftershave, perfume and deodorant removed Jewelry removed (may wrap wedding band) Make-up removed NA Hair care products removed Battery operated devices (external) removed Heating patches and chemical warmers removed Titanium eyewear removed Nail polish cured greater than 10 hours NA Casting material cured greater than 10 hours NA Hearing aids removed at home Loose dentures or partials removed dentures removed Prosthetics have been removed NA Patient demonstrates correct use of air break device (if applicable) Patient concerns have been addressed Patient grounding bracelet on and cord attached to chamber Specifics for Inpatients (complete in addition to above) Medication sheet sent with patient NA Intravenous medications needed or due during therapy sent with patient NA Drainage tubes (e.g. nasogastric tube or chest tube secured and vented) NA Endotracheal or Tracheotomy tube secured NA Cuff deflated of air and inflated with saline NA Airway suctioned NA Notes Paper version used prior to start of treatment. Electronic Signature(s) Signed: 04/11/2023 1:44:51 PM By: [name] CHT EMT BS , , Entered By: [name] on 04/11/2023 10:44:51

## 2023-04-12 ENCOUNTER — Encounter (HOSPITAL_BASED_OUTPATIENT_CLINIC_OR_DEPARTMENT_OTHER): Payer: No Typology Code available for payment source | Admitting: General Surgery

## 2023-04-15 ENCOUNTER — Encounter (HOSPITAL_BASED_OUTPATIENT_CLINIC_OR_DEPARTMENT_OTHER): Payer: No Typology Code available for payment source | Admitting: General Surgery

## 2023-04-16 ENCOUNTER — Encounter (HOSPITAL_BASED_OUTPATIENT_CLINIC_OR_DEPARTMENT_OTHER): Payer: No Typology Code available for payment source | Admitting: General Surgery

## 2023-04-22 ENCOUNTER — Encounter (HOSPITAL_BASED_OUTPATIENT_CLINIC_OR_DEPARTMENT_OTHER): Payer: No Typology Code available for payment source | Admitting: Internal Medicine

## 2023-04-23 ENCOUNTER — Encounter (HOSPITAL_BASED_OUTPATIENT_CLINIC_OR_DEPARTMENT_OTHER): Payer: No Typology Code available for payment source | Admitting: Internal Medicine

## 2023-04-23 ENCOUNTER — Encounter (HOSPITAL_BASED_OUTPATIENT_CLINIC_OR_DEPARTMENT_OTHER): Payer: No Typology Code available for payment source | Attending: Internal Medicine | Admitting: Internal Medicine

## 2023-04-23 DIAGNOSIS — E1142 Type 2 diabetes mellitus with diabetic polyneuropathy: Secondary | ICD-10-CM | POA: Diagnosis not present

## 2023-04-23 DIAGNOSIS — M1990 Unspecified osteoarthritis, unspecified site: Secondary | ICD-10-CM | POA: Insufficient documentation

## 2023-04-23 DIAGNOSIS — E1151 Type 2 diabetes mellitus with diabetic peripheral angiopathy without gangrene: Secondary | ICD-10-CM | POA: Insufficient documentation

## 2023-04-23 DIAGNOSIS — L97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: Secondary | ICD-10-CM | POA: Diagnosis not present

## 2023-04-23 DIAGNOSIS — N1832 Chronic kidney disease, stage 3b: Secondary | ICD-10-CM | POA: Diagnosis not present

## 2023-04-23 DIAGNOSIS — E1122 Type 2 diabetes mellitus with diabetic chronic kidney disease: Secondary | ICD-10-CM | POA: Diagnosis not present

## 2023-04-23 DIAGNOSIS — L97524 Non-pressure chronic ulcer of other part of left foot with necrosis of bone: Secondary | ICD-10-CM | POA: Insufficient documentation

## 2023-04-23 DIAGNOSIS — L97418 Non-pressure chronic ulcer of right heel and midfoot with other specified severity: Secondary | ICD-10-CM | POA: Diagnosis not present

## 2023-04-23 DIAGNOSIS — I129 Hypertensive chronic kidney disease with stage 1 through stage 4 chronic kidney disease, or unspecified chronic kidney disease: Secondary | ICD-10-CM | POA: Diagnosis not present

## 2023-04-23 DIAGNOSIS — E785 Hyperlipidemia, unspecified: Secondary | ICD-10-CM | POA: Insufficient documentation

## 2023-04-23 DIAGNOSIS — E11621 Type 2 diabetes mellitus with foot ulcer: Secondary | ICD-10-CM | POA: Diagnosis present

## 2023-04-23 LAB — GLUCOSE, CAPILLARY: Glucose-Capillary: 229 mg/dL — ABNORMAL HIGH (ref 70–99)

## 2023-04-23 NOTE — Progress Notes (Addendum)
JOSEPHANTHONY, JOKELA ([id number]) [id number] Page 1 of 8 Visit Report for 04/23/2023 Arrival Information Details Patient Name: Date of Service: [name], [name] 04/23/2023 12:30 PM Medical Record Number: [id number] Patient Account Number: [id number] Date of Birth/Sex: Treating RN: 02-21-1946 (77 y.o. [name], Lauren Primary Care Bao Bazen: [name] Other Clinician: Referring Brighton Pilley: Treating Tucker Steedley/Extender: [name] [facility], [name] Visit Information History Since Last Visit Added or deleted any medications: No Patient Arrived: Ambulatory Any new allergies or adverse reactions: No Arrival Time: 12:30 Had a fall or experienced change in No Accompanied By: wife activities of daily living that may affect Transfer Assistance: None risk of falls: Patient Identification Verified: Yes Signs or symptoms of abuse/neglect since last visito No Secondary Verification Process Completed: Yes Hospitalized since last visit: No Patient Requires Transmission-Based Precautions: No Implantable device outside of the clinic excluding No Patient Has Alerts: No cellular tissue based products placed in the center since last visit: Has Dressing in Place as Prescribed: Yes Has Compression in Place as Prescribed: Yes Pain Present Now: No Electronic Signature(s) Signed: 04/24/2023 8:03:48 AM By: Fonnie Mu RN Previous Signature: 04/23/2023 4:09:16 PM Version By: Fonnie Mu RN Entered By: Fonnie Mu on 04/24/2023 08:03:48 -------------------------------------------------------------------------------- Clinic Level of Care Assessment Details Patient Name: Date of Service: [name], [name] 04/23/2023 12:30 PM Medical Record Number: [id number] Patient Account Number: [id number] Date of Birth/Sex: Treating RN: 03-Mar-1946 (76 y.o. [name] Primary Care Hager Compston: [name] Other  Clinician: Referring Wallis Vancott: Treating Chandria Rookstool/Extender: [name] [facility], [name] Clinic Level of Care Assessment Items TOOL 4 Quantity Score X- 1 0 Use when only an EandM is performed on FOLLOW-UP visit ASSESSMENTS - Nursing Assessment / Reassessment X- 1 10 Reassessment of Co-morbidities (includes updates in patient status) X- 1 5 Reassessment of Adherence to Treatment Plan ASSESSMENTS - Wound and Skin A ssessment / Reassessment X - Simple Wound Assessment / Reassessment - one wound 1 5 []  - 0 Complex Wound Assessment / Reassessment - multiple wounds []  - 0 Dermatologic / Skin Assessment (not related to wound area) ASSESSMENTS - Focused Assessment []  - 0 Circumferential Edema Measurements - multi extremities []  - 0 Nutritional Assessment / Counseling / Intervention [name], [name] ([id number]) [id number] Page 2 of 8 []  - 0 Lower Extremity Assessment (monofilament, tuning fork, pulses) []  - 0 Peripheral Arterial Disease Assessment (using hand held doppler) ASSESSMENTS - Ostomy and/or Continence Assessment and Care []  - 0 Incontinence Assessment and Management []  - 0 Ostomy Care Assessment and Management (repouching, etc.) PROCESS - Coordination of Care X - Simple Patient / Family Education for ongoing care 1 15 []  - 0 Complex (extensive) Patient / Family Education for ongoing care X- 1 10 Staff obtains Chiropractor, Records, T Results / Process Orders est []  - 0 Staff telephones HHA, Nursing Homes / Clarify orders / etc []  - 0 Routine Transfer to another Facility (non-emergent condition) []  - 0 Routine Hospital Admission (non-emergent condition) []  - 0 New Admissions / Manufacturing engineer / Ordering NPWT Apligraf, etc. , []  - 0 Emergency Hospital Admission (emergent condition) X- 1 10 Simple Discharge Coordination []  - 0 Complex (extensive) Discharge Coordination PROCESS - Special Needs []  -  0 Pediatric / Minor Patient Management []  - 0 Isolation Patient Management []  - 0 Hearing / Language / Visual special needs []  - 0 Assessment of Community assistance (transportation, D/C planning, etc.) []  - 0 Additional assistance / Altered mentation []  -  0 Support Surface(s) Assessment (bed, cushion, seat, etc.) INTERVENTIONS - Wound Cleansing / Measurement X - Simple Wound Cleansing - one wound 1 5 []  - 0 Complex Wound Cleansing - multiple wounds X- 1 5 Wound Imaging (photographs - any number of wounds) []  - 0 Wound Tracing (instead of photographs) X- 1 5 Simple Wound Measurement - one wound []  - 0 Complex Wound Measurement - multiple wounds INTERVENTIONS - Wound Dressings X - Small Wound Dressing one or multiple wounds 1 10 []  - 0 Medium Wound Dressing one or multiple wounds []  - 0 Large Wound Dressing one or multiple wounds X- 1 5 Application of Medications - topical []  - 0 Application of Medications - injection INTERVENTIONS - Miscellaneous []  - 0 External ear exam []  - 0 Specimen Collection (cultures, biopsies, blood, body fluids, etc.) []  - 0 Specimen(s) / Culture(s) sent or taken to Lab for analysis []  - 0 Patient Transfer (multiple staff / Nurse, adult / Similar devices) []  - 0 Simple Staple / Suture removal (25 or less) []  - 0 Complex Staple / Suture removal (26 or more) []  - 0 Hypo / Hyperglycemic Management (close monitor of Blood Glucose) [name], [name] ([id number]) [id number] Page 3 of 8 []  - 0 Ankle / Brachial Index (ABI) - do not check if billed separately X- 1 5 Vital Signs Has the patient been seen at the hospital within the last three years: Yes Total Score: 90 Level Of Care: New/Established - Level 3 Electronic Signature(s) Signed: 04/24/2023 4:12:43 PM By: Fonnie Mu RN Entered By: Fonnie Mu on 04/24/2023  08:09:24 -------------------------------------------------------------------------------- Encounter Discharge Information Details Patient Name: Date of Service: [name], [name] 04/23/2023 12:30 PM Medical Record Number: [id number] Patient Account Number: [id number] Date of Birth/Sex: Treating RN: [birth year] (77 y.o. [name] Primary Care Smantha Boakye: [name] Other Clinician: Referring Koleman Marling: Treating Galilea Quito/Extender: Kathaleen Grinder in Treatment: 8 Encounter Discharge Information Items Discharge Condition: Stable Ambulatory Status: Ambulatory Discharge Destination: Home Transportation: Private Auto Accompanied By: wife Schedule Follow-up Appointment: Yes Clinical Summary of Care: Patient Declined Electronic Signature(s) Signed: 04/24/2023 8:09:58 AM By: Fonnie Mu RN Entered By: Fonnie Mu on 04/24/2023 08:09:58 -------------------------------------------------------------------------------- Lower Extremity Assessment Details Patient Name: Date of Service: [name], [name] 04/23/2023 12:30 PM Medical Record Number: [id number] Patient Account Number: [id number] Date of Birth/Sex: Treating RN: Oct 10, 1945 (77 y.o. [name] Primary Care Joyel Chenette: [name] Other Clinician: Referring Kasmira Cacioppo: Treating Staysha Truby/Extender: Leota Jacobsen Weeks in Treatment: 8 Edema Assessment Assessed: [Left: Yes] [Right: No] Edema: [Left: Ye] [Right: s] Calf Left: Right: Point of Measurement: From Medial Instep 32 cm Ankle Left: Right: Point of Measurement: From Medial Instep 22 cm Vascular Assessment Left: [id number] Page 4 of 8Right:] Extremity colors, hair growth, and conditions: Extremity Color: [[id number] Page 4 of 8Normal] Hair Growth on Extremity: [[id number] Page 4 of 8No] Temperature of  Extremity: [[id number] Page 4 of 8Warm] Capillary Refill: [id number] Page 4 of 8< 3 seconds] Dependent Rubor: [[id number] Page 4 of 8No No] Electronic Signature(s) Signed: 04/24/2023 8:04:26 AM By: Fonnie Mu RN Entered By: Fonnie Mu on 04/24/2023 08:04:26 -------------------------------------------------------------------------------- Multi Wound Chart Details Patient Name: Date of Service: [name], [name] 04/23/2023 12:30 PM Medical Record Number: [id number] Patient Account Number: [id number] Date of Birth/Sex: Treating RN: 1945-09-17 (77 y.o. M) Primary Care Infant Zink: [name] Other Clinician: Referring Normagene Harvie: Treating Izabel Chim/Extender: Kathaleen Grinder in Treatment: 8 Vital Signs Height(in): 70 Pulse(bpm): 62  Weight(lbs): 196 Blood Pressure(mmHg): 167/66 Body Mass Index(BMI): 28.1 Temperature(F): 97.6 Respiratory Rate(breaths/min): 17 [1:Photos: No Photos Left, Posterior Calcaneus Wound Location: Blister Wounding Event: Diabetic Wound/Ulcer of the Lower Primary Etiology: Extremity Arrhythmia, Coronary Artery Disease, N/A Comorbid History: Hypertension, Peripheral Venous Disease, Type II  Diabetes, Osteoarthritis, Neuropathy 01/20/2023 Date Acquired: 8 Weeks of Treatment: Open Wound Status: No Wound Recurrence: 1x0.5x0.2 Measurements L x W x D (cm) 0.393 A (cm) : rea 0.079 Volume (cm) : 76.80% % Reduction in A rea: 76.70% % Reduction in  Volume: Grade 3 Classification: Medium Exudate A mount: Serosanguineous Exudate Type: red, brown Exudate Color: Distinct, outline attached Wound Margin: Large (67-100%) Granulation A mount: Red, Pink Granulation Quality: Small (1-33%) Necrotic A mount:  Fat Layer (Subcutaneous Tissue): Yes N/A Exposed Structures: Fascia: No Tendon: No Muscle: No Joint: No Bone: No Medium (34-66%) Epithelialization: No Abnormalities  Noted Periwound Skin Texture: Dry/Scaly: No Periwound Skin Moisture: No Abnormalities  Noted Periwound Skin Color:] [N/A:N/A N/A N/A N/A N/A N/A N/A N/A N/A N/A N/A N/A N/A N/A N/A N/A N/A N/A N/A N/A N/A N/A N/A N/A N/A] Treatment Notes [name], [name] ([id number]) [id number] Page 5 of 8 Wound #1 (Calcaneus) Wound Laterality: Left, Posterior Cleanser Wound Cleanser Discharge Instruction: Cleanse the wound with wound cleanser prior to applying a clean dressing using gauze sponges, not tissue or cotton balls. Peri-Wound Care Skin Prep Discharge Instruction: Use skin prep as directed Topical Skintegrity Hydrogel 4 (oz) Discharge Instruction: Apply hydrogel as directed Primary Dressing Cutimed Sorbact 1.5 inx23/8 in Discharge Instruction: with Hydrogel into the wound bed Secondary Dressing Zetuvit Plus Silicone Border Dressing 3x3 (in/in) Discharge Instruction: Apply silicone border over primary dressing as directed. Secured With Compression Wrap Compression Stockings Facilities manager) Signed: 04/28/2023 9:47:40 AM By: [name] MD Entered By: [name] on 04/27/2023 21:21:06 -------------------------------------------------------------------------------- Multi-Disciplinary Care Plan Details Patient Name: Date of Service: [name], [name] 04/23/2023 12:30 PM Medical Record Number: [id number] Patient Account Number: [id number] Date of Birth/Sex: Treating RN: 1945/11/08 (77 y.o. [name] Primary Care Doxie Augenstein: [name] Other Clinician: Referring Jamyria Ozanich: Treating Jaymes Hang/Extender: [name] [facility], [name] Active Inactive Wound/Skin Impairment Nursing Diagnoses: Knowledge deficit related to smoking impact on wound healing Goals: Patient/caregiver will verbalize understanding of skin care regimen Date Initiated: 02/26/2023 Target Resolution Date: 05/17/2023 Goal  Status: Active Interventions: Assess patient/caregiver ability to obtain necessary supplies Assess patient/caregiver ability to perform ulcer/skin care regimen upon admission and as needed Assess ulceration(s) every visit Provide education on ulcer and skin care Screen for HBO Treatment Activities: Consult for HBO : 02/26/2023 [name], [name] ([id number]) [id number] Page 6 of 8 Skin care regimen initiated : 02/26/2023 Topical wound management initiated : 02/26/2023 Notes: Electronic Signature(s) Signed: 04/24/2023 8:08:08 AM By: Fonnie Mu RN Entered By: Fonnie Mu on 04/24/2023 08:08:08 -------------------------------------------------------------------------------- Pain Assessment Details Patient Name: Date of Service: [name], [name] 04/23/2023 12:30 PM Medical Record Number: [id number] Patient Account Number: [id number] Date of Birth/Sex: Treating RN: 10/22/1945 (77 y.o. [name] Primary Care Avrian Delfavero: [name] Other Clinician: Referring Taylr Meuth: Treating Samba Cumba/Extender: [name], [name] Active Problems Location of Pain Severity and Description of Pain Patient Has Paino No Site Locations Pain Management and Medication Current Pain Management: Electronic Signature(s) Signed: 04/24/2023 8:04:17 AM By: Fonnie Mu RN Entered By: Fonnie Mu on 04/24/2023 08:04:17 -------------------------------------------------------------------------------- Patient/Caregiver Education Details Patient Name: Date of Service: [name], [name] 12/3/2024andnbsp12:30 PM  Medical Record Number: [id number] Patient Account Number: [id number] Date of Birth/Gender: Treating RN: 05/17/1946 (77 y.o. [name] Primary Care Physician: [name] Other Clinician: Referring Physician: Treating Physician/Extender: Kathaleen Grinder in  Treatment: [address], Troy S ([id number]) [id number] Page 7 of 8 Education Assessment Education Provided To: Patient Education Topics Provided Wound/Skin Impairment: Methods: Explain/Verbal Responses: Reinforcements needed, State content correctly Electronic Signature(s) Signed: 04/24/2023 4:12:43 PM By: Fonnie Mu RN Entered By: Fonnie Mu on 04/24/2023 08:08:20 -------------------------------------------------------------------------------- Wound Assessment Details Patient Name: Date of Service: [name], [name] 04/23/2023 12:30 PM Medical Record Number: [id number] Patient Account Number: [id number] Date of Birth/Sex: Treating RN: 04-22-1946 (77 y.o. [name] Primary Care Lanie Schelling: [name] Other Clinician: Referring Olaoluwa Grieder: Treating Jaythan Hinely/Extender: [name], [name] Wound Status Wound Number: 1 Primary Diabetic Wound/Ulcer of the Lower Extremity Etiology: Wound Location: Left, Posterior Calcaneus Wound Open Wounding Event: Blister Status: Date Acquired: 01/20/2023 Comorbid Arrhythmia, Coronary Artery Disease, Hypertension, Peripheral Weeks Of Treatment: 8 History: Venous Disease, Type II Diabetes, Osteoarthritis, Neuropathy Clustered Wound: No Wound Measurements Length: (cm) 1 Width: (cm) 0.5 Depth: (cm) 0.2 Area: (cm) 0.393 Volume: (cm) 0.079 % Reduction in Area: 76.8% % Reduction in Volume: 76.7% Epithelialization: Medium (34-66%) Tunneling: No Undermining: No Wound Description Classification: Grade 3 Wound Margin: Distinct, outline attached Exudate Amount: Medium Exudate Type: Serosanguineous Exudate Color: red, brown Foul Odor After Cleansing: No Slough/Fibrino Yes Wound Bed Granulation Amount: Large (67-100%) Exposed Structure Granulation Quality: Red, Pink Fascia Exposed: No Necrotic Amount: Small (1-33%) Fat Layer (Subcutaneous Tissue)  Exposed: Yes Necrotic Quality: Adherent Slough Tendon Exposed: No Muscle Exposed: No Joint Exposed: No Bone Exposed: No Periwound Skin Texture Texture Color No Abnormalities Noted: Yes No Abnormalities Noted: Yes Moisture No Abnormalities Noted: No Dry / Scaly: No [name], [name] ([id number]) [id number] Page 8 of 8 Treatment Notes Wound #1 (Calcaneus) Wound Laterality: Left, Posterior Cleanser Wound Cleanser Discharge Instruction: Cleanse the wound with wound cleanser prior to applying a clean dressing using gauze sponges, not tissue or cotton balls. Peri-Wound Care Skin Prep Discharge Instruction: Use skin prep as directed Topical Skintegrity Hydrogel 4 (oz) Discharge Instruction: Apply hydrogel as directed Primary Dressing Cutimed Sorbact 1.5 inx23/8 in Discharge Instruction: with Hydrogel into the wound bed Secondary Dressing Zetuvit Plus Silicone Border Dressing 3x3 (in/in) Discharge Instruction: Apply silicone border over primary dressing as directed. Secured With Compression Wrap Compression Stockings Facilities manager) Signed: 04/24/2023 8:04:41 AM By: Fonnie Mu RN Entered By: Fonnie Mu on 04/24/2023 08:04:41 -------------------------------------------------------------------------------- Vitals Details Patient Name: Date of Service: [name], [name] 04/23/2023 12:30 PM Medical Record Number: [id number] Patient Account Number: [id number] Date of Birth/Sex: Treating RN: 1945-08-15 (77 y.o. [name] Primary Care Laurieann Friddle: [name] Other Clinician: Referring Sakeena Teall: Treating Leinani Lisbon/Extender: [name], [name] Vital Signs Time Taken: 12:30 Temperature (F): 97.6 Height (in): 70 Pulse (bpm): 62 Weight (lbs): 196 Respiratory Rate (breaths/min): 17 Body Mass Index (BMI): 28.1 Blood Pressure (mmHg): 167/66 Reference Range: 80 - 120 mg /  dl Electronic Signature(s) Signed: 04/24/2023 8:04:11 AM By: Fonnie Mu RN Entered By: Fonnie Mu on 04/24/2023 08:04:11

## 2023-04-24 ENCOUNTER — Encounter (HOSPITAL_BASED_OUTPATIENT_CLINIC_OR_DEPARTMENT_OTHER): Payer: No Typology Code available for payment source | Admitting: Internal Medicine

## 2023-04-24 DIAGNOSIS — L97524 Non-pressure chronic ulcer of other part of left foot with necrosis of bone: Secondary | ICD-10-CM | POA: Diagnosis not present

## 2023-04-24 LAB — GLUCOSE, CAPILLARY
Glucose-Capillary: 160 mg/dL — ABNORMAL HIGH (ref 70–99)
Glucose-Capillary: 245 mg/dL — ABNORMAL HIGH (ref 70–99)
Glucose-Capillary: 192 mg/dL — ABNORMAL HIGH (ref 70–99)

## 2023-04-25 ENCOUNTER — Encounter (HOSPITAL_BASED_OUTPATIENT_CLINIC_OR_DEPARTMENT_OTHER): Payer: No Typology Code available for payment source | Admitting: Internal Medicine

## 2023-04-25 DIAGNOSIS — L97524 Non-pressure chronic ulcer of other part of left foot with necrosis of bone: Secondary | ICD-10-CM | POA: Diagnosis not present

## 2023-04-25 LAB — GLUCOSE, CAPILLARY
Glucose-Capillary: 170 mg/dL — ABNORMAL HIGH (ref 70–99)
Glucose-Capillary: 153 mg/dL — ABNORMAL HIGH (ref 70–99)

## 2023-04-25 NOTE — Progress Notes (Signed)
KAMAR, STEINBACHER ([id number]) [id number] Page 1 of 2 Visit Report for 04/24/2023 Arrival Information Details Patient Name: Date of Service: [name], [name] 04/24/2023 12:00 PM Medical Record Number: [id number] Patient Account Number: [id number] Date of Birth/Sex: Treating RN: 05/20/1946 (77 y.o. [name] Primary Care [name] Other Clinician: [name] Referring [name], [name] Visit Information History Since Last Visit All ordered tests and consults were completed: Yes Patient Arrived: [name] Added or deleted any medications: No Arrival Time: 11:22 Any new allergies or adverse reactions: No Accompanied By: self Had Billy fall or experienced change in No Transfer Assistance: None activities of daily living that may affect Patient Identification Verified: Yes risk of falls: Secondary Verification Process Completed: Yes Signs or symptoms of abuse/neglect since last visito No Patient Requires Transmission-Based Precautions: No Hospitalized since last visit: No Patient Has Alerts: No Implantable device outside of the clinic excluding No cellular tissue based products placed in the center since last visit: Pain Present Now: No Electronic Signature(s) Signed: 04/24/2023 6:16:59 PM By: [name] CHT EMT BS , , Previous Signature: 04/24/2023 6:11:28 PM Version By: [name] CHT EMT BS , , Entered By: [name] on 04/24/2023 18:16:58 -------------------------------------------------------------------------------- Encounter Discharge Information Details Patient Name: Date of Service: [name], [name] 04/24/2023 12:00 PM Medical Record Number: [id number] Patient Account Number: [id number] Date of Birth/Sex: Treating RN: 1946-03-06 (77 y.o. [name] Primary Care Iyannah Blake: [name] Other  Clinician: [name] Referring Terina Mcelhinny: Treating Adaora Mchaney/Extender: [name], [name] Encounter Discharge Information Items Discharge Condition: Stable Ambulatory Status: Walker Discharge Destination: Home Transportation: Private Auto Accompanied By: spouse Schedule Follow-up Appointment: No Clinical Summary of Care: Electronic Signature(s) Signed: 04/24/2023 6:16:46 PM By: [name] CHT EMT BS , , Entered By: [name] on 04/24/2023 18:16:46 Carns, Les Pou ([id number]) [id number] Page 2 of 2 -------------------------------------------------------------------------------- Vitals Details Patient Name: Date of Service: [name], [name] 04/24/2023 12:00 PM Medical Record Number: [id number] Patient Account Number: [id number] Date of Birth/Sex: Treating RN: [birth year] (77 y.o. [name] Primary Care Quintina Hakeem: [name] Other Clinician: [name] Referring Quintella Mura: Treating Jamonte Curfman/Extender: [name] [name] Vital Signs Time Taken: 11:30 Temperature (F): 98.2 Height (in): 70 Pulse (bpm): 68 Weight (lbs): 196 Respiratory Rate (breaths/min): 18 Body Mass Index (BMI): 28.1 Blood Pressure (mmHg): 141/52 Capillary Blood Glucose (mg/dl): 301 Reference Range: 80 - 120 mg / dl Electronic Signature(s) Signed: 04/24/2023 6:12:12 PM By: [name] CHT EMT BS , , Entered By: [name] on 04/24/2023 18:12:12

## 2023-04-25 NOTE — Progress Notes (Signed)
XABIER, ROSENMAN ([id number]) [id number] Page 1 of 2 Visit Report for 04/23/2023 Arrival Information Details Patient Name: Date of Service: [name], [name] 04/23/2023 1:15 PM Medical Record Number: [id number] Patient Account Number: [id number] Date of Birth/Sex: Treating RN: 03/20/1946 (77 y.o. [name] Primary Care Seryna Marek: [name] Other Clinician: [name] Referring Kimyetta Flott: Treating Jalacia Mattila/Extender: [name] [name] Visit Information History Since Last Visit All ordered tests and consults were completed: Yes Patient Arrived: [name] Added or deleted any medications: No Arrival Time: 11:18 Any new allergies or adverse reactions: No Accompanied By: spouse Had a fall or experienced change in No Transfer Assistance: None activities of daily living that may affect Patient Identification Verified: Yes risk of falls: Secondary Verification Process Completed: Yes Signs or symptoms of abuse/neglect since last visito No Patient Requires Transmission-Based Precautions: No Hospitalized since last visit: No Patient Has Alerts: No Implantable device outside of the clinic excluding No cellular tissue based products placed in the center since last visit: Pain Present Now: No Electronic Signature(s) Signed: 04/24/2023 4:07:42 PM By: [name] CHT EMT BS , , Entered By: [name] on 04/24/2023 16:07:41 -------------------------------------------------------------------------------- Encounter Discharge Information Details Patient Name: Date of Service: [name], [name] 04/23/2023 1:15 PM Medical Record Number: [id number] Patient Account Number: [id number] Date of Birth/Sex: Treating RN: 1946-01-21 (77 y.o. [name] Primary Care Zaira Iacovelli: [facility], [place] Other Clinician: [name] Referring Almer Littleton: Treating Arash Karstens/Extender: [name] [name] Encounter Discharge Information Items Discharge Condition: Stable Ambulatory Status: Walker Discharge Destination: Home Transportation: Private Auto Accompanied By: spouse Schedule Follow-up Appointment: No Clinical Summary of Care: Electronic Signature(s) Signed: 04/24/2023 4:14:53 PM By: [name] CHT EMT BS , , Entered By: [name] on 04/24/2023 16:14:52 Sheeran, Les Pou ([id number]) [id number] Page 2 of 2 -------------------------------------------------------------------------------- Vitals Details Patient Name: Date of Service: [name], [name] 04/23/2023 1:15 PM Medical Record Number: [id number] Patient Account Number: [id number] Date of Birth/Sex: Treating RN: 1945-07-10 (77 y.o. [name] Primary Care Biagio Snelson: [name] Other Clinician: [name] Referring Chesley Veasey: Treating Bessy Reaney/Extender: [name] [name] Vital Signs Time Taken: 15:14 Temperature (F): 98.0 Height (in): 70 Pulse (bpm): 61 Weight (lbs): 196 Respiratory Rate (breaths/min): 12 Body Mass Index (BMI): 28.1 Blood Pressure (mmHg): 148/84 Capillary Blood Glucose (mg/dl): 626 Reference Range: 80 - 120 mg / dl Electronic Signature(s) Signed: 04/24/2023 4:08:16 PM By: [name] CHT EMT BS , , Entered By: [name] on 04/24/2023 16:08:16

## 2023-04-25 NOTE — Progress Notes (Addendum)
BURNHAM, SEIN ([id number]) [id number] Page 1 of 3 Visit Report for 04/23/2023 HBO Details Patient Name: Date of Service: [name], [name] 04/23/2023 1:15 PM Medical Record Number: [id number] Patient Account Number: [id number] Date of Birth/Sex: Treating RN: January 07, 1946 (77 y.o. [name] Primary Care Siena Poehler: [facility], [place] Other Clinician: [name] Referring Mikale Silversmith: Treating Dequon Schnebly/Extender: [name] [name] HBO Treatment Course Details Treatment Course Number: 1 Ordering Josejuan Hoaglin: [name] T Treatments Ordered: otal 40 HBO Treatment Start Date: 03/11/2023 HBO Indication: Diabetic Ulcer(s) of the Lower Extremity Standard/Conservative Wound Care tried and failed greater than or equal to 30 days Wound #1 Left, Posterior Calcaneus , Wound #2 Right, Posterior Calcaneus HBO Treatment Details Treatment Number: 21 Patient Type: Outpatient Chamber Type: Monoplace Chamber Serial #: [id number] Treatment Protocol: 2.0 ATA with 90 minutes oxygen, and no air breaks Treatment Details Compression Rate Down: 1.5 psi / minute De-Compression Rate Up: 1.5 psi / minute Air breaks and breathing Decompress Decompress Compress Tx Pressure Begins Reached periods Begins Ends (leave unused spaces blank) Chamber Pressure (ATA 1 2 ------2 1 ) Clock Time (24 hr) 13:17 13:33 - - - - - - 15:03 15:12 Treatment Length: 115 (minutes) Treatment Segments: 4 Vital Signs Capillary Blood Glucose Reference Range: 80 - 120 mg / dl HBO Diabetic Blood Glucose Intervention Range: <131 mg/dl or >621 mg/dl Type: Time Vitals Blood Respiratory Capillary Blood Glucose Pulse Action Pulse: Temperature: Taken: Pressure: Rate: Glucose (mg/dl): Meter #: Oximetry (%) Taken: Pre 15:14 148/84 61 12 98 229 none per protocol Post 15:14 144/60 62 12 97.9 160 none per protocol Treatment Response Treatment Toleration:  Well Treatment Completion Status: Treatment Completed without Adverse Event Treatment Notes Mr. Deloera arrived with normal vital signs. He prepared for treatment. After performing Billy safety check, the patient was placed in the chamber which was compressed with 100% oxygen at the rate of 1.5 psi/min after confirming normal ear equalization. He tolerated the treatment and the subsequent decompression of the chamber at the rate of 1.5 psi/min. His post-treatment vital signs were within normal range. He was stable upon discharge. Additional Procedure Documentation Tissue [name] Josefa [place] Notes no concerns with rx given Physician HBO Attestation: I certify that I supervised this HBO treatment in accordance with Medicare guidelines. Billy trained emergency response team is readily available per Yes hospital policies and procedures. AZAM, UDDIN ([id number]) [id number] Page 2 of 3 Continue HBOT as ordered. Yes Electronic Signature(s) Signed: 04/28/2023 9:47:40 AM By: [name] MD Previous Signature: 04/24/2023 4:14:03 PM Version By: [name] CHT EMT BS , , Entered By: [name] on 04/28/2023 09:46:44 -------------------------------------------------------------------------------- HBO Safety Checklist Details Patient Name: Date of Service: [name], [name] 04/23/2023 1:15 PM Medical Record Number: [id number] Patient Account Number: [id number] Date of Birth/Sex: Treating RN: 1945-09-28 (77 y.o. [name] Primary Care Mindee Robledo: [name] Other Clinician: [name] Referring Aubriauna Riner: Treating Carsyn Taubman/Extender: [name] [name] HBO Safety Checklist Items Safety Checklist Consent Form Signed Patient voided / foley secured and emptied When did you last eato 1036 2 egg, [place] sausage, biscuit, grits Last dose of injectable or oral agent 0905 - Metformin Ostomy  pouch emptied and vented if applicable NA All implantable devices assessed, documented and approved NA Intravenous access site secured and place NA Valuables secured Linens and cotton and cotton/polyester blend (less than 51% polyester) Personal oil-based products / skin lotions / body lotions removed Wigs  or hairpieces removed NA Smoking or tobacco materials removed NA Books / newspapers / magazines / loose paper removed Cologne, aftershave, perfume and deodorant removed Jewelry removed (may wrap wedding band) Make-up removed NA Hair care products removed Battery operated devices (external) removed Heating patches and chemical warmers removed Titanium eyewear removed Nail polish cured greater than 10 hours NA Casting material cured greater than 10 hours NA Hearing aids removed at home Loose dentures or partials removed dentures removed Prosthetics have been removed NA Patient demonstrates correct use of air break device (if applicable) Patient concerns have been addressed Patient grounding bracelet on and cord attached to chamber Specifics for Inpatients (complete in addition to above) Medication sheet sent with patient NA Intravenous medications needed or due during therapy sent with patient NA Drainage tubes (e.g. nasogastric tube or chest tube secured and vented) NA Endotracheal or Tracheotomy tube secured NA Cuff deflated of air and inflated with saline NA Airway suctioned NA Notes Paper version used prior to start of treatment. Electronic Signature(s) Signed: 04/24/2023 4:10:24 PM By: [name] CHT EMT BS , , Entered By: [name] on 04/24/2023 16:10:24 [name] ([id number]) [id number] Page 3 of 3

## 2023-04-25 NOTE — Progress Notes (Signed)
ZAHAIR, CALI ([id number]) [id number] Page 1 of 1 Visit Report for 04/24/2023 SuperBill Details Patient Name: Date of Service: [name], [name] 04/24/2023 Medical Record Number: [id number] Patient Account Number: [id number] Date of Birth/Sex: Treating RN: [birth year] (77 y.o. 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 E11.42 Type 2 diabetes mellitus with diabetic polyneuropathy E11.51 Type 2 diabetes mellitus with diabetic peripheral angiopathy without gangrene Facility Procedures CPT4 Code Description Modifier Quantity [id number] G0277-(Facility Use Only) HBOT full body chamber, , 4 ICD-10 Diagnosis Description 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 E11.42 Type 2 diabetes mellitus with diabetic polyneuropathy Physician Procedures Quantity CPT4 Code Description Modifier [id number] - WC PHYS HYPERBARIC OXYGEN THERAPY 1 ICD-10 Diagnosis Description 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

## 2023-04-25 NOTE — Progress Notes (Addendum)
CALUB, BAILIN ([id number]) [id number] Page 1 of 3 Visit Report for 04/24/2023 HBO Details Patient Name: Date of Service: [name], [name] 04/24/2023 12:00 PM Medical Record Number: [id number] Patient Account Number: [id number] Date of Birth/Sex: Treating RN: 1945/11/23 (77 y.o. [name] Primary Care Shaylea Ucci: [name] Other Clinician: [name] Referring Olegario Emberson: Treating Darreld Hoffer/Extender: [name] [name] HBO Treatment Course Details Treatment Course Number: 1 Ordering Jostin Rue: [name] T Treatments Ordered: otal 40 HBO Treatment Start Date: 03/11/2023 HBO Indication: Diabetic Ulcer(s) of the Lower Extremity Standard/Conservative Wound Care tried and failed greater than or equal to 30 days Wound #1 Left, Posterior Calcaneus , Wound #2 Right, Posterior Calcaneus HBO Treatment Details Treatment Number: 22 Patient Type: Outpatient Chamber Type: Monoplace Chamber Serial #: [id number] Treatment Protocol: 2.0 ATA with 90 minutes oxygen, and no air breaks Treatment Details Compression Rate Down: 1.0 psi / minute De-Compression Rate Up: 1.5 psi / minute Air breaks and breathing Decompress Decompress Compress Tx Pressure Begins Reached periods Begins Ends (leave unused spaces blank) Chamber Pressure (ATA 1 2 ------2 1 ) Clock Time (24 hr) 11:59 12:11 - - - - - - 13:41 13:53 Treatment Length: 114 (minutes) Treatment Segments: 4 Vital Signs Capillary Blood Glucose Reference Range: 80 - 120 mg / dl HBO Diabetic Blood Glucose Intervention Range: <131 mg/dl or >536 mg/dl Type: Time Vitals Blood Pulse: Respiratory Temperature: Capillary Blood Glucose Pulse Action Taken: Pressure: Rate: Glucose (mg/dl): Meter #: Oximetry (%) Taken: Pre 11:30 141/52 68 18 98.2 245 none per protocol Post 13:56 144/70 59 18 98 192 asymptomatic for bradycardia Treatment Response Treatment  Toleration: Well Treatment Completion Status: Treatment Completed without Adverse Event Treatment Notes [name] arrived with normal vital signs except diastolic BP at 52 mmHg. He stated that he felt fine and denied symptoms of hypotension. He prepared for treatment. After performing a safety check, the patient was placed in the chamber which was compressed with 100% oxygen at the rate of 1.5 psi/min after confirming normal ear equalization. He tolerated the treatment and the subsequent decompression of the chamber at the rate of 1.5 psi/min. His post-treatment vital signs were within normal range. He was stable upon discharge. Additional Procedure Documentation Tissue [name] Nayshawn Mesta Notes no concerns with rx given Physician HBO Attestation: I certify that I supervised this HBO treatment in accordance with Medicare guidelines. A trained emergency response team is readily available per Yes [name], [name] ([id number]) [id number] Page 2 of 3 hospital policies and procedures. Continue HBOT as ordered. Yes Electronic Signature(s) Signed: 04/25/2023 4:45:28 AM By: [name] MD Previous Signature: 04/24/2023 6:15:55 PM Version By: [name] CHT EMT BS , , Entered By: [name] on 04/25/2023 04:40:38 -------------------------------------------------------------------------------- HBO Safety Checklist Details Patient Name: Date of Service: [name], [name] 04/24/2023 12:00 PM Medical Record Number: [id number] Patient Account Number: [id number] Date of Birth/Sex: Treating RN: Jul 14, 1945 (77 y.o. [name] Primary Care Jaysa Kise: [name] Other Clinician: [name] Referring Kannon Baum: Treating [place] Inthavong/Extender: [name] [name] HBO Safety Checklist Items Safety Checklist Consent Form Signed Patient voided / foley secured and emptied 1000 2 eggs, [place]  sausage, grits, biscuit, When did you last eato orange juice Last dose of injectable or oral agent 0910 - Metformin Ostomy pouch emptied and vented if applicable NA All implantable devices assessed, documented and approved NA Intravenous access site secured and place NA Valuables secured Linens and  cotton and cotton/polyester blend (less than 51% polyester) Personal oil-based products / skin lotions / body lotions removed Wigs or hairpieces removed NA Smoking or tobacco materials removed NA Books / newspapers / magazines / loose paper removed Cologne, aftershave, perfume and deodorant removed Jewelry removed (may wrap wedding band) Make-up removed NA Hair care products removed Battery operated devices (external) removed Heating patches and chemical warmers removed Titanium eyewear removed Nail polish cured greater than 10 hours NA Casting material cured greater than 10 hours NA Hearing aids removed at home Loose dentures or partials removed dentures removed Prosthetics have been removed NA Patient demonstrates correct use of air break device (if applicable) Patient concerns have been addressed Patient grounding bracelet on and cord attached to chamber Specifics for Inpatients (complete in addition to above) Medication sheet sent with patient NA Intravenous medications needed or due during therapy sent with patient NA Drainage tubes (e.g. nasogastric tube or chest tube secured and vented) NA Endotracheal or Tracheotomy tube secured NA Cuff deflated of air and inflated with saline NA Airway suctioned NA Notes Paper version used prior to start of treatment. Electronic Signature(s) Signed: 04/24/2023 6:13:39 PM By: [name] CHT EMT BS , , [place], Signed: 04/24/2023 6:13:39 PM By: [name] CHT EMT BS [place] ([id number]) [id number] Page 3 of 3 , , Entered By: [name] on 04/24/2023 18:13:38

## 2023-04-26 ENCOUNTER — Encounter (HOSPITAL_BASED_OUTPATIENT_CLINIC_OR_DEPARTMENT_OTHER): Payer: No Typology Code available for payment source | Admitting: Internal Medicine

## 2023-04-26 NOTE — Progress Notes (Signed)
Billy, [name] ([id number]) [id number] Page 1 of 2 Visit Report for 03/27/2023 Arrival Information Details Patient Name: Date of Service: [name], [name] 03/27/2023 10:00 A M Medical Record Number: [id number] Patient Account Number: [id number] Date of Birth/Sex: Treating RN: 1946/02/01 (77 y.o. [name] Primary Care Jaice Digioia: [name] Other Clinician: Daryll Brod Referring Wood Novacek: Treating Coralyn Roselli/Extender: [name] [place] in Treatment: 4 Visit Information History Since Last Visit Added or deleted any medications: No Patient Arrived: Ambulatory Any new allergies or adverse reactions: No Arrival Time: 13:05 Had a fall or experienced change in No Accompanied By: wife activities of daily living that may affect Transfer Assistance: None risk of falls: Patient Identification Verified: Yes Signs or symptoms of abuse/neglect since last visito No Secondary Verification Process Completed: Yes Hospitalized since last visit: No Patient Requires Transmission-Based Precautions: No Implantable device outside of the clinic excluding No Patient Has Alerts: No cellular tissue based products placed in the center since last visit: Pain Present Now: No Electronic Signature(s) Signed: 04/25/2023 3:18:21 PM By: Demetria Pore Entered By: Demetria Pore on 03/27/2023 10:06:36 -------------------------------------------------------------------------------- Encounter Discharge Information Details Patient Name: Date of Service: [name], [name] 03/27/2023 10:00 A M Medical Record Number: [id number] Patient Account Number: [id number] Date of Birth/Sex: Treating RN: Aug 11, 1945 (77 y.o. [name] Primary Care Paulo Keimig: [name] Other Clinician: Daryll Brod Referring Jagjit Riner: Treating Stacie Templin/Extender: Kath[facility] Grinder in Treatment: 4 Encounter Discharge  Information Items Discharge Condition: Stable Ambulatory Status: Ambulatory Discharge Destination: Home Transportation: Private Auto Accompanied By: wife Schedule Follow-up Appointment: Yes Clinical Summary of Care: Electronic Signature(s) Signed: 04/25/2023 3:18:21 PM By: Demetria Pore Entered By: Demetria Pore on 03/27/2023 10:28:00 [name] ([id number]) [id number] Page 2 of 2 -------------------------------------------------------------------------------- Vitals Details Patient Name: Date of Service: [name], [name] 03/27/2023 10:00 A M Medical Record Number: [id number] Patient Account Number: [id number] Date of Birth/Sex: Treating RN: 1945-08-22 (77 y.o. [name] Primary Care Mercedees Convery: [name] Other Clinician: [name] Referring Nyomie Ehrlich: Treating Izaih Kataoka/Extender: [name] [place] in Treatment: 4 Vital Signs Time Taken: 09:50 Temperature (F): 97.2 Height (in): 70 Pulse (bpm): 67 Weight (lbs): 196 Respiratory Rate (breaths/min): 16 Body Mass Index (BMI): 28.1 Blood Pressure (mmHg): 147/54 Capillary Blood Glucose (mg/dl): 109 Reference Range: 80 - 120 mg / dl Electronic Signature(s) Signed: 04/25/2023 3:18:21 PM By: Demetria Pore Entered By: Demetria Pore on 03/27/2023 10:07:35

## 2023-04-26 NOTE — Progress Notes (Signed)
FELDER, CRISCIONE ([id number]) [id number] Page 1 of 2 Visit Report for 03/27/2023 HBO Details Patient Name: Date of Service: [name], [name] 03/27/2023 10:00 A M Medical Record Number: [id number] Patient Account Number: [id number] Date of Birth/Sex: Treating RN: Oct 04, 1945 (77 y.o. [name] Primary Care Soleia Badolato: [name] Other Clinician: Daryll Brod Referring Nashly Olsson: Treating Jaisha Villacres/Extender: [name] [place] in Treatment: 4 HBO Treatment Course Details Treatment Course Number: 1 Ordering Nichole Neyer: [name] T Treatments Ordered: otal 40 HBO Treatment Start Date: 03/11/2023 HBO Indication: Diabetic Ulcer(s) of the Lower Extremity Standard/Conservative Wound Care tried and failed greater than or equal to 30 days Wound #1 Left, Posterior Calcaneus , Wound #2 Right, Posterior Calcaneus HBO Treatment Details Treatment Number: 9 Patient Type: Outpatient Chamber Type: Monoplace Chamber Serial #: [id number] Treatment Protocol: 2.0 ATA with 90 minutes oxygen, and no air breaks Treatment Details Compression Rate Down: 2.0 psi / minute De-Compression Rate Up: 1.5 psi / minute Air breaks and breathing Decompress Decompress Compress Tx Pressure Begins Reached periods Begins Ends (leave unused spaces blank) Chamber Pressure (ATA 1 2 ------2 1 ) Clock Time (24 hr) 9:58 10:13 - - - - - - 11:43 11:51 Treatment Length: 113 (minutes) Treatment Segments: 4 Vital Signs Capillary Blood Glucose Reference Range: 80 - 120 mg / dl HBO Diabetic Blood Glucose Intervention Range: <131 mg/dl or >782 mg/dl Type: Time Vitals Blood Respiratory Capillary Blood Glucose Pulse Action Pulse: Temperature: Taken: Pressure: Rate: Glucose (mg/dl): Meter #: Oximetry (%) Taken: Pre 09:50 147/54 67 16 97.2 179 1 none per protocol Post 11:52 150/70 68 18 161 1 none per protocol Treatment Response Treatment  Toleration: Well Treatment Completion Status: Treatment Completed without Adverse Event Treatment Notes Patient arrived with normal vital signs. He stated that he ate breakfast. He self-administered Afrin today. After performing a safety check, he was placed in the chamber which was compressed at 1.5 psi/min after confirming normal ear equalization. He tolerated the treatment and subsequent decompression at the rate of 1.5 psi/min. He denied any issues with ear equalization and/or barotrauma. His post-treatment vital signs were within normal range. Additional Procedure Documentation Tissue [name] Yittel Emrich Notes No concerns with treatment given Physician HBO Attestation: I certify that I supervised this HBO treatment in accordance with Medicare guidelines. A trained emergency response team is readily available per Yes hospital policies and procedures. SUMEDH, CARELOCK ([id number]) [id number] Page 2 of 2 Continue HBOT as ordered. Yes Electronic Signature(s) Signed: 03/29/2023 1:12:05 PM By: [name] CHT EMT BS , , Signed: 04/01/2023 4:37:17 PM By: [name] MD Previous Signature: 03/27/2023 6:10:46 PM Version By: [name] CHT EMT BS , , Previous Signature: 03/28/2023 4:34:18 PM Version By: [name] MD Previous Signature: 03/27/2023 4:54:50 PM Version By: [name] MD Entered By: [name] on 03/29/2023 10:12:04 -------------------------------------------------------------------------------- HBO Safety Checklist Details Patient Name: Date of Service: [name], [name] 03/27/2023 10:00 A M Medical Record Number: [id number] Patient Account Number: [id number] Date of Birth/Sex: Treating RN: 07/22/1945 (77 y.o. [name] Primary Care Larayah Clute: [name] Other Clinician: Daryll Brod Referring Belia Febo: Treating Zamya Culhane/Extender: [name] [place] in  Treatment: 4 HBO Safety Checklist Items Safety Checklist Consent Form Signed Patient voided / foley secured and emptied When did you last eato 8:00 eggs, orange juice, sausage patty Last dose of injectable or oral agent 7:45 Ostomy pouch emptied and vented if applicable NA All implantable devices assessed, documented and approved NA  Intravenous access site secured and place NA Valuables secured Linens and cotton and cotton/polyester blend (less than 51% polyester) Personal oil-based products / skin lotions / body lotions removed Wigs or hairpieces removed NA Smoking or tobacco materials removed NA Books / newspapers / magazines / loose paper removed Cologne, aftershave, perfume and deodorant removed Jewelry removed (may wrap wedding band) Make-up removed NA Hair care products removed Battery operated devices (external) removed NA Heating patches and chemical warmers removed NA Titanium eyewear removed NA Nail polish cured greater than 10 hours NA Casting material cured greater than 10 hours NA Hearing aids removed NA Loose dentures or partials removed NA Prosthetics have been removed NA Patient demonstrates correct use of air break device (if applicable) Patient concerns have been addressed Patient grounding bracelet on and cord attached to chamber Specifics for Inpatients (complete in addition to above) Medication sheet sent with patient NA Intravenous medications needed or due during therapy sent with patient NA Drainage tubes (e.g. nasogastric tube or chest tube secured and vented) Endotracheal or Tracheotomy tube secured NA Cuff deflated of air and inflated with saline NA Airway suctioned NA Electronic Signature(s) Signed: 04/25/2023 3:18:21 PM By: Demetria Pore Entered By: Demetria Pore on 03/27/2023 10:09:31

## 2023-04-28 NOTE — Progress Notes (Signed)
[name], [name] ([id number]) [id number] Page 1 of 7 Visit Report for 04/23/2023 HPI Details Patient Name: Date of Service: Billy [name], [name] 04/23/2023 12:30 PM Medical Record Number: [id number] Patient Account Number: [id number] Date of Birth/Sex: Treating RN: 1945/11/03 (77 y.o. M) Primary Care Provider: [name] Other Clinician: Referring Provider: Treating Provider/Extender: [name], [name] in Treatment: 8 History of Present Illness HPI Description: ADMISSION 02/26/2023 Patient is Billy 77 year old man with type 2 diabetes. He has had Billy 63-month history of wounds on his bilateral heels just above the heel tips. They have been using Iodoflex/Iodosorb ointment. The patient has Billy history of PAD and has Billy SFA stent on the right. He also also status post left iliac stent. Furthermore he has Billy history of carotid artery stenosis status post right carotid endarterectomy and Billy left subclavian artery stenosis. His wound started on his blisters on the heel that of open to wounds with minimal but some depth right greater than left. He also has advanced diabetic neuropathy. The patient is cared for through the [place] and has been referred here for hyperbaric oxygen treatment through his vascular surgeon. Past medical history includes hypertension, hyperlipidemia, type 2 diabetes chronic kidney disease stage IIIb, carotid artery stenosis status post right carotid endarterectomy, left subclavian artery stenosis ABIs done in our clinic were 1.11 on the right and 0.75 on the left these were remarkably similar to what was quoted during the vascular surgery appointment from 02/05/2023. 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 He was referred for hyperbaric oxygen by the [place]. His chest x-ray I think shows vascular crowding but no pneumothorax. He has been using Iodoflex on the wound with  reasonably good improvement this week. He sees his ENT in 2 days and then they will make Billy decision about when they want to start HBO 10/24; patient is started HBO I believe he is on treatment 5 today. He had some problems with his right ear on the second dive however he has had no problems since and says he feels well. He slept through treatment yesterday. He has been using Iodosorb ointment on the wounds on both heels for Billy long period even before he came here. Unfortunately the wound surface still does not look completely as viable as I would like. 11/5; he continues to tolerate HBO fairly well although he is having some fullness in his left ear that is reversed by popping his left ear. He has been using Sorbact hydrogel. He comes in today with the wounds quite Billy bit smaller especially on the right but also on the left. He is fairly rigorous and offloading these with heel offloading shoes. 11/11; right posterior calcaneus is closed down. The area on the left calcaneus laterally still has some depth. We have been using Sorbact on both sides. He has completed 12 out of 40 treatments 11/19; right posterior calcaneus is still closed the area on the left is quite Billy bit smaller. We have been using Sorbact on the left heel. He is still receiving HBO which was requested by the [place]. These wounds may be healed soon 12/3; the only remaining wound is the left posterior calcaneus and it is smaller. We've been using sore act. He is continuing HBO. On presentation he had Billy similar wound on the right posterior heel which is healed two or three weeks ago. Electronic Signature(s) Signed: 04/28/2023 9:47:40 AM By: Billy Najjar MD  Entered By: [name] on 04/27/2023 21:21:59 -------------------------------------------------------------------------------- Physical Exam Details Patient Name: Date of Service: Billy [name], [name] 04/23/2023 12:30 PM Medical Record Number: [id number] Patient Account Number:  [id number] Date of Birth/Sex: Treating RN: 31-May-1945 (77 y.o. M) Primary Care Provider: [name] Other Clinician: Referring Provider: Treating Provider/Extender: [name] in Treatment: [address] ([id number]) [id number] Page 2 of 7 Constitutional Patient is hypertensive.. Pulse regular and within target range for patient.Marland Kitchen Respirations regular, non-labored and within target range.. Temperature is normal and within the target range for the patient.Marland Kitchen Appears in no distress. Notes wound exam. Wag 2 wounds. finishing hbo. still open on left. slough. no debridement Electronic Signature(s) Signed: 04/28/2023 9:47:40 AM By: Billy Najjar MD Entered By: [name] on 04/27/2023 21:30:26 -------------------------------------------------------------------------------- Physician Orders Details Patient Name: Date of Service: Billy [name], [name] 04/23/2023 12:30 PM Medical Record Number: [id number] Patient Account Number: [id number] Date of Birth/Sex: Treating RN: 12-31-1945 (77 y.o. [name] Primary Care Provider: [name] Other Clinician: Referring Provider: Treating Provider/Extender: [name] in Treatment: 8 Verbal / Phone Orders: No Diagnosis Coding Follow-up Appointments ppointment in 1 week. - DR. Leanord Hawking Return Billy Other: - ****Will skip wound care appt week of Thanksgiving. Will continue Hyperbarics.**** Once wounds close will discontinue hyperbarics. Wear shoe to right foot for protection and keep cover for protection 1-2 weeks. Anesthetic (In clinic) Topical Lidocaine 4% applied to wound bed - In clinic Bathing/ Shower/ Hygiene May shower with protection but do not get wound dressing(s) wet. Protect dressing(s) with water repellant cover (for example, large plastic bag) or Billy cast cover and may then take shower. Off-Loading Wound #1  Left,Posterior Calcaneus Wedge shoe to: - Off loading shoes bilaterally to alleviate pressure from heels. Turn and reposition every 2 hours Prevalon Boot - bilaterally at bedtime Hyperbaric Oxygen Therapy Wound #1 Left,Posterior Calcaneus Evaluate for HBO Therapy - Per patient, he has claustrophobia. Evaluate for HBO Therapy - Per patient, he has claustrophobia. If appropriate for treatment, begin HBOT per protocol: 2.0 ATA for 90 Minutes without Billy Breaks ir 2.0 ATA for 90 Minutes without Billy Breaks ir Total Number of Treatments: - 40 One treatments per day (delivered Monday through Friday unless otherwise specified in Special Instructions below): One treatments per day (delivered Monday through Friday unless otherwise specified in Special Instructions below): Finger stick Blood Glucose Pre- and Post- HBOT Treatment. Finger stick Blood Glucose Pre- and Post- HBOT Treatment. Follow Hyperbaric Oxygen Glycemia Protocol Follow Hyperbaric Oxygen Glycemia Protocol Give two 4oz orange juices in addition to Glucerna when the glycemic protocol is used. Give two 4oz orange juices in addition to Glucerna when the glycemic protocol is used. Billy frin (Oxymetazoline HCL) 0.05% nasal spray - 1 spray in both nostrils daily as needed prior to HBO treatment for difficulty clearing ears Billy frin (Oxymetazoline HCL) 0.05% nasal spray - 1 spray in both nostrils daily as needed prior to HBO treatment for difficulty clearing ears Wound Treatment Wound #1 - Calcaneus Wound Laterality: Left, Posterior Cleanser: Wound Cleanser Every Other Day/30 Days Discharge Instructions: Cleanse the wound with wound cleanser prior to applying Billy clean dressing using gauze sponges, not tissue or cotton balls. Peri-Wound Care: Skin Prep Every Other Day/30 Days Discharge Instructions: Use skin prep as directed Topical: Skintegrity Hydrogel 4 (oz) Every Other Day/30 Days [name], [name] ([id number])  [id number] Page 3 of 7 Discharge Instructions: Apply hydrogel as directed Prim Dressing: Cutimed Sorbact  1.5 inx23/8 in Every Other Day/30 Days ary Discharge Instructions: with Hydrogel into the wound bed Secondary Dressing: Zetuvit Plus Silicone Border Dressing 3x3 (in/in) Every Other Day/30 Days Discharge Instructions: Apply silicone border over primary dressing as directed. GLYCEMIA INTERVENTIONS PROTOCOL PRE-HBO GLYCEMIA INTERVENTIONS ACTION INTERVENTION Obtain pre-HBO capillary blood glucose (ensure 1 physician order is in chart). Billy. Notify HBO physician and await physician orders. 2 If result is 70 mg/dl or below: B. If the result meets the hospital definition of Billy critical result, follow hospital policy. Billy. Give patient an 8 ounce Glucerna Shake, an 8 ounce Ensure, or 8 ounces of Billy Glucerna/Ensure equivalent dietary supplement*. B. Wait 30 minutes. If result is 71 mg/dl to 295 mg/dl: C. Retest patients capillary blood glucose (CBG). D. If result greater than or equal to 110 mg/dl, proceed with HBO. If result less than 110 mg/dl, notify HBO physician and consider holding HBO. If result is 131 mg/dl to 284 mg/dl: Billy. Proceed with HBO. Billy. Notify HBO physician and await physician orders. B. It is recommended to hold HBO and do If result is 250 mg/dl or greater: blood/urine ketone testing. C. If the result meets the hospital definition of Billy critical result, follow hospital policy. POST-HBO GLYCEMIA INTERVENTIONS ACTION INTERVENTION Obtain post HBO capillary blood glucose (ensure 1 physician order is in chart). Billy. Notify HBO physician and await physician orders. 2 If result is 70 mg/dl or below: B. If the result meets the hospital definition of Billy critical result, follow hospital policy. Billy. Give patient an 8 ounce Glucerna Shake, an 8 ounce Ensure, or 8 ounces of Billy Glucerna/Ensure equivalent dietary supplement*. B. Wait 15 minutes for  symptoms of If result is 71 mg/dl to 132 mg/dl: hypoglycemia (i.e. nervousness, anxiety, sweating, chills, clamminess, irritability, confusion, tachycardia or dizziness). C. If patient asymptomatic, discharge patient. If patient symptomatic, repeat capillary blood glucose (CBG) and notify HBO physician. If result is 101 mg/dl to 440 mg/dl: Billy. Discharge patient. Billy. Notify HBO physician and await physician orders. B. It is recommended to do blood/urine ketone If result is 250 mg/dl or greater: testing. C. If the result meets the hospital definition of Billy critical result, follow hospital policy. *Juice or candies are NOT equivalent products. If patient refuses the Glucerna or Ensure, please consult the hospital dietitian for an appropriate substitute. Electronic Signature(s) Signed: 04/24/2023 8:07:46 AM By: Fonnie Mu RN Signed: 04/28/2023 9:47:40 AM By: Billy Najjar MD Entered By: Fonnie Mu on 04/24/2023 08:07:46 -------------------------------------------------------------------------------- Problem List Details Patient Name: Date of Service: Billy [name], [name] 04/23/2023 12:30 PM Medical Record Number: [id number] Patient Account Number: [id number] [name], [name] ([id number]) [id number] Page 4 of 7 Date of Birth/Sex: Treating RN: [birth year] (77 y.o. 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 with other specified 02/26/2023 No Yes severity E11.42 Type 2 diabetes mellitus with diabetic polyneuropathy 02/26/2023 No Yes E11.51 Type 2 diabetes mellitus with diabetic peripheral angiopathy without gangrene  02/26/2023 No Yes Inactive Problems ICD-10 Code Description Active Date Inactive Date 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 02/26/2023 02/26/2023 Resolved Problems Electronic Signature(s) Signed: 04/28/2023 9:47:40 AM By: Billy Najjar MD Entered By: [name] on 04/27/2023 21:20:35 -------------------------------------------------------------------------------- Progress Note Details Patient Name: Date  of Service: Billy [name], [name] 04/23/2023 12:30 PM Medical Record Number: [id number] Patient Account Number: [id number] Date of Birth/Sex: Treating RN: 1946/02/12 (76 y.o. M) Primary Care Provider: [name] Other Clinician: Referring Provider: Treating Provider/Extender: [name] [facility], [name] in Treatment: 8 Subjective History of Present Illness (HPI) ADMISSION 02/26/2023 Patient is Billy 77 year old man with type 2 diabetes. He has had Billy 12-month history of wounds on his bilateral heels just above the heel tips. They have been using Iodoflex/Iodosorb ointment. The patient has Billy history of PAD and has Billy SFA stent on the right. He also also status post left iliac stent. Furthermore he has Billy history of carotid artery stenosis status post right carotid endarterectomy and Billy left subclavian artery stenosis. His wound started on his blisters on the heel that of open to wounds with minimal but some depth right greater than left. He also has advanced diabetic neuropathy. The patient is cared for through the [place] and has been referred here for hyperbaric oxygen treatment through his vascular surgeon. Past medical history includes hypertension, hyperlipidemia, type 2 diabetes chronic kidney disease stage IIIb, carotid artery stenosis status post right carotid endarterectomy, left subclavian artery stenosis ABIs done in our clinic were 1.11 on the right and 0.75 on the left these were remarkably similar to what was  quoted during the vascular surgery appointment from 02/05/2023. 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 He was referred for hyperbaric oxygen by the [place]. His chest x-ray I think shows vascular crowding but no pneumothorax. He has been using Iodoflex on the wound with reasonably good improvement this week. He sees his ENT in 2 days and then they will make Billy decision about when they want to start HBO 10/24; patient is started HBO I believe he is on treatment 5 today. He had some problems with his right ear on the second dive however he has had no problems since and says he feels well. He slept through treatment yesterday. He has been using Iodosorb ointment on the wounds on both heels for Billy long period even before he came here. Unfortunately the wound surface still does not look completely as viable as I would like. 11/5; he continues to tolerate HBO fairly well although he is having some fullness in his left ear that is reversed by popping his left ear. He has been using Sorbact hydrogel. He comes in today with the wounds quite Billy bit smaller especially on the right but also on the left. He is fairly rigorous and offloading these with heel offloading shoes. 11/11; right posterior calcaneus is closed down. The area on the left calcaneus laterally still has some depth. We have been using Sorbact on both sides. He has completed 12 out of 40 treatments 11/19; right posterior calcaneus is still closed the area on the left is quite Billy bit smaller. We have been using Sorbact on the left heel. He is still receiving HBO which was requested by the [place]. These wounds may be healed soon 12/3; the only remaining wound is the left posterior calcaneus and it is smaller. We've been using sore act. He is continuing HBO. On presentation he had Billy similar wound on the right posterior  heel which is healed two or three weeks ago. Objective Constitutional Patient is hypertensive.. Pulse regular and within target range for patient.Marland Kitchen Respirations regular, non-labored and within  target range.. Temperature is normal and within the target range for the patient.Marland Kitchen Appears in no distress. Vitals Time Taken: 12:30 PM, Height: 70 in, Weight: 196 lbs, BMI: 28.1, Temperature: 97.6 F, Pulse: 62 bpm, Respiratory Rate: 17 breaths/min, Blood Pressure: 167/66 mmHg. General Notes: wound exam. Wag 2 wounds. finishing hbo. still open on left. slough. no debridement Integumentary (Hair, Skin) Wound #1 status is Open. Original cause of wound was Blister. The date acquired was: 01/20/2023. The wound has been in treatment 8 weeks. The wound is located on the Left,Posterior Calcaneus. The wound measures 1cm length x 0.5cm width x 0.2cm depth; 0.393cm^2 area and 0.079cm^3 volume. There is Fat Layer (Subcutaneous Tissue) exposed. There is no tunneling or undermining noted. There is Billy medium amount of serosanguineous drainage noted. The wound margin is distinct with the outline attached to the wound base. There is large (67-100%) red, pink granulation within the wound bed. There is Billy small (1-33%) amount of necrotic tissue within the wound bed including Adherent Slough. The periwound skin appearance had no abnormalities noted for texture. The periwound skin appearance had no abnormalities noted for color. The periwound skin appearance did not exhibit: Dry/Scaly. 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 - DR. Leanord Hawking Other: - ****Will skip wound care appt week of Thanksgiving. Will continue  Hyperbarics.**** Once wounds close will discontinue hyperbarics. Wear shoe to right foot for protection and keep cover for protection 1-2 weeks. Anesthetic: (In clinic) Topical Lidocaine 4% applied to wound bed - In clinic Bathing/ Shower/ Hygiene: May shower with protection but do not get wound dressing(s) wet. Protect dressing(s) with water repellant cover (for example, large plastic bag) or Billy cast cover and may then take shower. Off-Loading: Wound #1 Left,Posterior Calcaneus: Wedge shoe to: - Off loading shoes bilaterally to alleviate pressure from heels. Turn and reposition every 2 hours Prevalon Boot - bilaterally at bedtime Hyperbaric Oxygen Therapy: Wound #1 Left,Posterior Calcaneus: [name], [name] ([id number]) [id number] Page 6 of 7 Evaluate for HBO Therapy - Per patient, he has claustrophobia. Evaluate for HBO Therapy - Per patient, he has claustrophobia. If appropriate for treatment, begin HBOT per protocol: 2.0 ATA for 90 Minutes without Air Breaks 2.0 ATA for 90 Minutes without Air Breaks T Number of Treatments: - 40 otal One treatments per day (delivered Monday through Friday unless otherwise specified in Special Instructions below): One treatments per day (delivered Monday through Friday unless otherwise specified in Special Instructions below): Finger stick Blood Glucose Pre- and Post- HBOT Treatment. Finger stick Blood Glucose Pre- and Post- HBOT Treatment. Follow Hyperbaric Oxygen Glycemia Protocol Follow Hyperbaric Oxygen Glycemia Protocol Give two 4oz orange juices in addition to Glucerna when the glycemic protocol is used. Give two 4oz orange juices in addition to Glucerna when the glycemic protocol is used. Afrin (Oxymetazoline HCL) 0.05% nasal spray - 1 spray in both nostrils daily as needed prior to HBO treatment for difficulty clearing ears Afrin (Oxymetazoline HCL) 0.05% nasal spray - 1 spray in both nostrils daily as needed prior  to HBO treatment for difficulty clearing ears WOUND #1: - Calcaneus Wound Laterality: Left, Posterior Cleanser: Wound Cleanser Every Other Day/30 Days Discharge Instructions: Cleanse the wound with wound cleanser prior to applying Billy clean dressing using gauze sponges, not tissue  or cotton balls. Peri-Wound Care: Skin Prep Every Other Day/30 Days Discharge Instructions: Use skin prep as directed Topical: Skintegrity Hydrogel 4 (oz) Every Other Day/30 Days Discharge Instructions: Apply hydrogel as directed Prim Dressing: Cutimed Sorbact 1.5 inx23/8 in Every Other Day/30 Days ary Discharge Instructions: with Hydrogel into the wound bed Secondary Dressing: Zetuvit Plus Silicone Border Dressing 3x3 (in/in) Every Other Day/30 Days Discharge Instructions: Apply silicone border over primary dressing as directed. still Billy wound on the left posterior heel improved dimensions and condition of the wound bed still sorbact hydrogel continue hbo Electronic Signature(s) Signed: 04/28/2023 9:47:40 AM By: Billy Najjar MD Entered By: [name] on 04/27/2023 21:36:04 -------------------------------------------------------------------------------- SuperBill Details Patient Name: Date of Service: [name], [name] 04/23/2023 Medical Record Number: [id number] Patient Account Number: [id number] Date of Birth/Sex: Treating RN: January 11, 1946 (77 y.o. 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 ICD-10 Diagnosis Description E11.621 Type 2 diabetes mellitus with foot ulcer L97.428 Non-pressure chronic ulcer  of left heel and midfoot with other specified severity Quantity: 1 pdf Page 7 of 7 Electronic Signature(s) Signed: 04/28/2023 9:47:40 AM By: Billy Najjar MD Previous Signature: 04/24/2023 8:09:32 AM Version By: Fonnie Mu RN Entered By: [name] on 04/27/2023 21:36:33

## 2023-04-28 NOTE — Progress Notes (Signed)
CAROLS, KINTIGH ([id number]) [id number] Page 1 of 1 Visit Report for 04/23/2023 SuperBill Details Patient Name: Date of Service: [name], [name] 04/23/2023 Medical Record Number: [id number] Patient Account Number: [id number] Date of Birth/Sex: Treating RN: 10/22/1945 (77 y.o. 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 E11.42 Type 2 diabetes mellitus with diabetic polyneuropathy E11.51 Type 2 diabetes mellitus with diabetic peripheral angiopathy without gangrene Facility Procedures CPT4 Code Description Modifier Quantity [id number] G0277-(Facility Use Only) HBOT full body chamber, , 4 ICD-10 Diagnosis Description 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 E11.42 Type 2 diabetes mellitus with diabetic polyneuropathy Physician Procedures Quantity CPT4 Code Description Modifier [id number] - WC PHYS HYPERBARIC OXYGEN THERAPY 1 ICD-10 Diagnosis Description 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

## 2023-04-29 ENCOUNTER — Encounter (HOSPITAL_BASED_OUTPATIENT_CLINIC_OR_DEPARTMENT_OTHER): Payer: No Typology Code available for payment source | Admitting: Internal Medicine

## 2023-04-29 DIAGNOSIS — L97524 Non-pressure chronic ulcer of other part of left foot with necrosis of bone: Secondary | ICD-10-CM | POA: Diagnosis not present

## 2023-04-29 NOTE — Progress Notes (Signed)
[name], [name] ([id number]) [id number] Page 1 of 2 Visit Report for 04/29/2023 Arrival Information Details Patient Name: Date of Service: [name], [name] 04/29/2023 12:00 PM Medical Record Number: [id number] Patient Account Number: [id number] Date of Birth/Sex: Treating RN: [birth year] (77 y.o. [name] Primary Care Kden Wagster: [name] Other Clinician: [name] Referring Rosalita Carey: Treating Sayler Mickiewicz/Extender: [name] [name] Visit Information History Since Last Visit All ordered tests and consults were completed: Yes Patient Arrived: [name] Added or deleted any medications: No Arrival Time: 11:19 Any new allergies or adverse reactions: No Accompanied By: spouse Had a fall or experienced change in No Transfer Assistance: None activities of daily living that may affect Patient Identification Verified: Yes risk of falls: Secondary Verification Process Completed: Yes Signs or symptoms of abuse/neglect since last visito No Patient Requires Transmission-Based Precautions: No Hospitalized since last visit: No Patient Has Alerts: No Implantable device outside of the clinic excluding No cellular tissue based products placed in the center since last visit: Pain Present Now: No Electronic Signature(s) Signed: 04/29/2023 4:05:49 PM By: [name] CHT EMT BS , , Entered By: [name] on 04/29/2023 16:05:49 -------------------------------------------------------------------------------- Encounter Discharge Information Details Patient Name: Date of Service: [name], [name] 04/29/2023 12:00 PM Medical Record Number: [id number] Patient Account Number: [id number] Date of Birth/Sex: Treating RN: 02/10/1946 (77 y.o. [name] Primary Care Reya Aurich: [name] Other Clinician: [name] Referring Taran Hable: Treating Akansha Wyche/Extender: [name] [name] Encounter Discharge Information Items Discharge Condition: Stable Ambulatory Status: Walker Discharge Destination: Home Transportation: Private Auto Accompanied By: spouse Schedule Follow-up Appointment: No Clinical Summary of Care: Electronic Signature(s) Signed: 04/29/2023 4:12:17 PM By: [name] CHT EMT BS , , Entered By: [name] on 04/29/2023 16:12:16 [name] ([id number]) [id number] Page 2 of 2 -------------------------------------------------------------------------------- Vitals Details Patient Name: Date of Service: [name], [name] 04/29/2023 12:00 PM Medical Record Number: [id number] Patient Account Number: [id number] Date of Birth/Sex: Treating RN: June 03, 1945 (77 y.o. [name] Primary Care Horatio Bertz: [name] Other Clinician: [name] Referring Onesimo Lingard: Treating Zalan Shidler/Extender: [name] [name] Vital Signs Time Taken: 11:29 Temperature (F): 98.1 Height (in): 70 Pulse (bpm): 70 Weight (lbs): 196 Respiratory Rate (breaths/min): 18 Body Mass Index (BMI): 28.1 Blood Pressure (mmHg): 118/63 Capillary Blood Glucose (mg/dl): 329 Reference Range: 80 - 120 mg / dl Electronic Signature(s) Signed: 04/29/2023 4:07:19 PM By: [name] CHT EMT BS , , Entered By: [name] on 04/29/2023 16:07:19

## 2023-04-29 NOTE — Progress Notes (Signed)
[name], [name] ([id number]) [id number] Page 1 of 1 Visit Report for 04/29/2023 SuperBill Details Patient Name: Date of Service: [name], [name] 04/29/2023 Medical Record Number: [id number] Patient Account Number: [id number] Date of Birth/Sex: Treating RN: 08/22/1945 (77 y.o. 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 E11.42 Type 2 diabetes mellitus with diabetic polyneuropathy E11.51 Type 2 diabetes mellitus with diabetic peripheral angiopathy without gangrene Facility Procedures CPT4 Code Description Modifier Quantity [id number] G0277-(Facility Use Only) HBOT full body chamber, , 4 ICD-10 Diagnosis Description 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 E11.42 Type 2 diabetes mellitus with diabetic polyneuropathy Physician Procedures Quantity CPT4 Code Description Modifier [id number] 99183 - WC PHYS HYPERBARIC OXYGEN THERAPY 1 ICD-10 Diagnosis Description 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

## 2023-04-29 NOTE — Progress Notes (Addendum)
RAYMERE, FRAN ([id number]) [id number] Page 1 of 3 Visit Report for 04/29/2023 HBO Details Patient Name: Date of Service: [name], [name] 04/29/2023 12:00 PM Medical Record Number: [id number] Patient Account Number: [id number] Date of Birth/Sex: Treating RN: 05-02-1946 (77 y.o. [name] Primary Care Jenice Leiner: [name] Other Clinician: [name] Referring Dale Ribeiro: Treating Celestino Ackerman/Extender: [name] [name] HBO Treatment Course Details Treatment Course Number: 1 Ordering Alia Parsley: [name] T Treatments Ordered: otal 40 HBO Treatment Start Date: 03/11/2023 HBO Indication: Diabetic Ulcer(s) of the Lower Extremity Standard/Conservative Wound Care tried and failed greater than or equal to 30 days Wound #1 Left, Posterior Calcaneus , Wound #2 Right, Posterior Calcaneus HBO Treatment Details Treatment Number: 24 Patient Type: Outpatient Chamber Type: Monoplace Chamber Serial #: [id number] Treatment Protocol: 2.0 ATA with 90 minutes oxygen, and no air breaks Treatment Details Compression Rate Down: 1.0 psi / minute De-Compression Rate Up: 1.5 psi / minute Air breaks and breathing Decompress Decompress Compress Tx Pressure Begins Reached periods Begins Ends (leave unused spaces blank) Chamber Pressure (ATA 1 2 ------2 1 ) Clock Time (24 hr) 11:58 12:17 - - - - - - 13:47 14:00 Treatment Length: 122 (minutes) Treatment Segments: 4 Vital Signs Capillary Blood Glucose Reference Range: 80 - 120 mg / dl HBO Diabetic Blood Glucose Intervention Range: <131 mg/dl or >086 mg/dl Type: Time Vitals Blood Respiratory Capillary Blood Glucose Pulse Action Pulse: Temperature: Taken: Pressure: Rate: Glucose (mg/dl): Meter #: Oximetry (%) Taken: Pre 11:29 118/63 70 18 98.1 179 none per protocol Post 14:03 160/88 67 18 98.1 162 none per protocol Treatment Response Treatment  Toleration: Well Treatment Completion Status: Treatment Completed without Adverse Event Treatment Notes Mr. Dada arrived with normal vital signs. He prepared for treatment. After performing a safety check, the patient was placed in the chamber which was compressed with 100% oxygen at the rate of 1.5 psi/min after confirming normal ear equalization. He tolerated the treatment and the subsequent decompression of the chamber at the rate of 1.5 psi/min. His post-treatment vital signs were within normal range. He was stable upon discharge. Additional Procedure Documentation Tissue [name] [place] Gandolfo Notes No concerns with treatment given Physician HBO Attestation: I certify that I supervised this HBO treatment in accordance with Medicare guidelines. A trained emergency response team is readily available per Yes hospital policies and procedures. CORDNEY, HANES ([id number]) [id number] Page 2 of 3 Continue HBOT as ordered. Yes Electronic Signature(s) Signed: 04/30/2023 4:18:56 PM By: [name] CHT EMT BS , , Signed: 05/01/2023 10:03:28 AM By: [name] MD Previous Signature: 04/29/2023 4:43:54 PM Version By: [name] MD Previous Signature: 04/29/2023 4:10:44 PM Version By: [name] CHT EMT BS , , Entered By: [name] on 04/30/2023 16:18:55 -------------------------------------------------------------------------------- HBO Safety Checklist Details Patient Name: Date of Service: [name], [name] 04/29/2023 12:00 PM Medical Record Number: [id number] Patient Account Number: [id number] Date of Birth/Sex: Treating RN: [birth year] (77 y.o. [name] Primary Care Paizleigh Wilds: [name] Other Clinician: [name] Referring Taina Landry: Treating Sarabella Caprio/Extender: [name] [name] HBO Safety Checklist Items Safety Checklist Consent Form Signed Patient  voided / foley secured and emptied 1000 - 2 EGGS, 2 SAUSAGE PATTIES, When did you last eato GRITS, BISCUIT Last dose of injectable or oral agent 0810 Ostomy pouch emptied and vented if applicable NA All implantable devices assessed, documented and approved NA Intravenous access site secured and place NA Valuables  secured Linens and cotton and cotton/polyester blend (less than 51% polyester) Personal oil-based products / skin lotions / body lotions removed Wigs or hairpieces removed NA Smoking or tobacco materials removed NA Books / newspapers / magazines / loose paper removed Cologne, aftershave, perfume and deodorant removed Jewelry removed (may wrap wedding band) Make-up removed NA Hair care products removed Battery operated devices (external) removed Heating patches and chemical warmers removed Titanium eyewear removed Nail polish cured greater than 10 hours NA Casting material cured greater than 10 hours NA Hearing aids removed at home Loose dentures or partials removed dentures removed Prosthetics have been removed NA Patient demonstrates correct use of air break device (if applicable) Patient concerns have been addressed Patient grounding bracelet on and cord attached to chamber Specifics for Inpatients (complete in addition to above) Medication sheet sent with patient NA Intravenous medications needed or due during therapy sent with patient NA Drainage tubes (e.g. nasogastric tube or chest tube secured and vented) NA Endotracheal or Tracheotomy tube secured NA Cuff deflated of air and inflated with saline NA Airway suctioned NA Notes Paper version used prior to treatment start. Electronic Signature(s) ROCKNEY, BALTHAZOR ([id number]) [id number] Page 3 of 3 Signed: 04/29/2023 4:09:32 PM By: [name] CHT EMT BS , , Entered By: [name] on 04/29/2023 16:09:32

## 2023-04-30 ENCOUNTER — Encounter (HOSPITAL_BASED_OUTPATIENT_CLINIC_OR_DEPARTMENT_OTHER): Payer: No Typology Code available for payment source | Admitting: Internal Medicine

## 2023-04-30 DIAGNOSIS — L97524 Non-pressure chronic ulcer of other part of left foot with necrosis of bone: Secondary | ICD-10-CM | POA: Diagnosis not present

## 2023-04-30 LAB — GLUCOSE, CAPILLARY
Glucose-Capillary: 179 mg/dL — ABNORMAL HIGH (ref 70–99)
Glucose-Capillary: 162 mg/dL — ABNORMAL HIGH (ref 70–99)

## 2023-04-30 NOTE — Progress Notes (Addendum)
ADARIUS, ORDERS ([id number]) [id number] Page 1 of 2 Visit Report for 04/30/2023 Arrival Information Details Patient Name: Date of Service: [name], [name] 04/30/2023 1:15 PM Medical Record Number: [id number] Patient Account Number: [id number] Date of Birth/Sex: Treating RN: 1946-02-16 (77 y.o. [name] Primary Care Rio Kidane: [name] Other Clinician: [name] Referring Sarahjane Matherly: Treating Juliano Mceachin/Extender: [name] [place] in Treatment: 9 Visit Information History Since Last Visit All ordered tests and consults were completed: Yes Patient Arrived: [name] Added or deleted any medications: No Arrival Time: 13:15 Any new allergies or adverse reactions: No Accompanied By: spouse Had a fall or experienced change in No Transfer Assistance: None activities of daily living that may affect Patient Identification Verified: Yes risk of falls: Secondary Verification Process Completed: Yes Signs or symptoms of abuse/neglect since last visito No Patient Requires Transmission-Based Precautions: No Hospitalized since last visit: No Patient Has Alerts: No Implantable device outside of the clinic excluding No cellular tissue based products placed in the center since last visit: Pain Present Now: No Electronic Signature(s) Signed: 04/30/2023 3:59:52 PM By: [name] CHT EMT BS , , Entered By: [name] on 04/30/2023 15:59:52 -------------------------------------------------------------------------------- Encounter Discharge Information Details Patient Name: Date of Service: [name], [name] 04/30/2023 1:15 PM Medical Record Number: [id number] Patient Account Number: [id number] Date of Birth/Sex: Treating RN: 09-02-1945 (77 y.o. [name] Primary Care Iracema Lanagan: [name] Other Clinician: [name] Referring Yahshua Thibault: Treating Armando Lauman/Extender: Kathale[facility] Grinder in Treatment: 9 Encounter Discharge Information Items Discharge Condition: Stable Ambulatory Status: Ambulatory Discharge Destination: Home Transportation: Private Auto Accompanied By: spouse Schedule Follow-up Appointment: No Clinical Summary of Care: Electronic Signature(s) Signed: 04/30/2023 4:11:51 PM By: [name] CHT EMT BS , , Previous Signature: 04/30/2023 4:11:41 PM Version By: [name] CHT EMT BS , , Entered By: [name] on 04/30/2023 16:11:51 [name] ([id number]) [id number] Page 2 of 2 -------------------------------------------------------------------------------- Vitals Details Patient Name: Date of Service: [name], [name] 04/30/2023 1:15 PM Medical Record Number: [id number] Patient Account Number: [id number] Date of Birth/Sex: Treating RN: 11/23/1945 (77 y.o. [name] Primary Care Kasiya Burck: [name] Other Clinician: [name] Referring Phyliss Hulick: Treating Nickisha Hum/Extender: [name] [place] in Treatment: 9 Vital Signs Time Taken: 13:23 Temperature (F): 98.1 Height (in): 70 Pulse (bpm): 69 Weight (lbs): 196 Respiratory Rate (breaths/min): 18 Body Mass Index (BMI): 28.1 Blood Pressure (mmHg): 166/71 Capillary Blood Glucose (mg/dl): 109 Reference Range: 80 - 120 mg / dl Electronic Signature(s) Signed: 04/30/2023 4:00:47 PM By: [name] CHT EMT BS , , Entered By: [name] on 04/30/2023 16:00:47

## 2023-04-30 NOTE — Progress Notes (Signed)
[name], [name] ([id number]) [id number] Page 1 of 3 Visit Report for 04/30/2023 HBO Details Patient Name: Date of Service: [name], [name] 04/30/2023 1:15 PM Medical Record Number: [id number] Patient Account Number: [id number] Date of Birth/Sex: Treating RN: 1946-04-02 (77 y.o. [name] Primary Care Patricia Perales: [facility] [place] Other Clinician: [name] Referring Neila Teem: Treating Zylah Elsbernd/Extender: [name] [place] in Treatment: 9 HBO Treatment Course Details Treatment Course Number: 1 Ordering Kamirah Shugrue: [name] T Treatments Ordered: otal 40 HBO Treatment Start Date: 03/11/2023 HBO Indication: Diabetic Ulcer(s) of the Lower Extremity Standard/Conservative Wound Care tried and failed greater than or equal to 30 days Wound #1 Left, Posterior Calcaneus , Wound #2 Right, Posterior Calcaneus HBO Treatment Details Treatment Number: 25 Patient Type: Outpatient Chamber Type: Monoplace Chamber Serial #: [id number] Treatment Protocol: 2.0 ATA with 90 minutes oxygen, and no air breaks Treatment Details Compression Rate Down: 1.0 psi / minute De-Compression Rate Up: 1.5 psi / minute Air breaks and breathing Decompress Decompress Compress Tx Pressure Begins Reached periods Begins Ends (leave unused spaces blank) Chamber Pressure (ATA 1 2 ------2 1 ) Clock Time (24 hr) 13:45 13:59 - - - - - - 15:29 15:39 Treatment Length: 114 (minutes) Treatment Segments: 4 Vital Signs Capillary Blood Glucose Reference Range: 80 - 120 mg / dl HBO Diabetic Blood Glucose Intervention Range: <131 mg/dl or >387 mg/dl Type: Time Vitals Blood Respiratory Capillary Blood Glucose Pulse Action Pulse: Temperature: Taken: Pressure: Rate: Glucose (mg/dl): Meter #: Oximetry (%) Taken: Pre 13:23 166/71 69 18 98.1 183 none per protocol Post 15:42 157/62 61 18 97.3 117 none per protocol Treatment Response Treatment  Toleration: Well Treatment Completion Status: Treatment Completed without Adverse Event Treatment Notes Mr. Diane arrived with normal vital signs. He prepared for treatment. After performing Billy safety check, the patient was placed in the chamber which was compressed with 100% oxygen at the rate of 1.5 psi/min after confirming normal ear equalization. He tolerated the treatment and the subsequent decompression of the chamber at the rate of 1.5 psi/min. His post-treatment vital signs were within normal range. He was stable upon discharge. Additional Procedure Documentation Tissue [name] Juliany Daughety Notes No concern with treatment given Physician HBO Attestation: I certify that I supervised this HBO treatment in accordance with Medicare guidelines. Billy trained emergency response team is readily available per Yes hospital policies and procedures. ADIR, GUNNETT ([id number]) [id number] Page 2 of 3 Continue HBOT as ordered. Yes Electronic Signature(s) Unsigned Previous Signature: 04/30/2023 4:19:50 PM Version By: [name] CHT EMT BS , , Previous Signature: 04/30/2023 4:07:48 PM Version By: [name] CHT EMT BS , , Previous Signature: 04/30/2023 4:05:29 PM Version By: [name] CHT EMT BS , , Entered By: [name] on 04/30/2023 13:28:21 -------------------------------------------------------------------------------- HBO Safety Checklist Details Patient Name: Date of Service: [name], [name] 04/30/2023 1:15 PM Medical Record Number: [id number] Patient Account Number: [id number] Date of Birth/Sex: Treating RN: 1946-04-17 (77 y.o. [name] Primary Care Rosemary Mossbarger: [name] Other Clinician: [name] Referring Dakin Madani: Treating Azavier Creson/Extender: [name] [place] in Treatment: 9 HBO Safety Checklist Items Safety Checklist Consent Form Signed Patient voided /  foley secured and emptied When did you last eato 0930 2 egg 2 sau patty, whole wheat bread Last dose of injectable or oral agent 0810 Metformin Ostomy pouch emptied and vented if applicable NA All implantable devices assessed, documented and approved NA Intravenous access site secured and place NA Valuables  secured Linens and cotton and cotton/polyester blend (less than 51% polyester) Personal oil-based products / skin lotions / body lotions removed Wigs or hairpieces removed NA Smoking or tobacco materials removed NA Books / newspapers / magazines / loose paper removed Cologne, aftershave, perfume and deodorant removed Jewelry removed (may wrap wedding band) Make-up removed NA Hair care products removed Battery operated devices (external) removed Heating patches and chemical warmers removed Titanium eyewear removed Nail polish cured greater than 10 hours NA Casting material cured greater than 10 hours NA Hearing aids removed at home Loose dentures or partials removed dentures removed Prosthetics have been removed NA Patient demonstrates correct use of air break device (if applicable) Patient concerns have been addressed Patient grounding bracelet on and cord attached to chamber Specifics for Inpatients (complete in addition to above) Medication sheet sent with patient NA Intravenous medications needed or due during therapy sent with patient NA Drainage tubes (e.g. nasogastric tube or chest tube secured and vented) NA Endotracheal or Tracheotomy tube secured NA Cuff deflated of air and inflated with saline NA Airway suctioned NA Notes Paper version used prior to treatment start. [employer], [employer] ([id number]) [id number] Page 3 of 3 Electronic Signature(s) Signed: 04/30/2023 4:04:05 PM By: [name] CHT EMT BS , , Entered By: [name] on 04/30/2023 13:04:04

## 2023-05-01 ENCOUNTER — Encounter (HOSPITAL_BASED_OUTPATIENT_CLINIC_OR_DEPARTMENT_OTHER): Payer: No Typology Code available for payment source | Admitting: Internal Medicine

## 2023-05-01 DIAGNOSIS — L97524 Non-pressure chronic ulcer of other part of left foot with necrosis of bone: Secondary | ICD-10-CM | POA: Diagnosis not present

## 2023-05-01 LAB — GLUCOSE, CAPILLARY
Glucose-Capillary: 183 mg/dL — ABNORMAL HIGH (ref 70–99)
Glucose-Capillary: 117 mg/dL — ABNORMAL HIGH (ref 70–99)
Glucose-Capillary: 227 mg/dL — ABNORMAL HIGH (ref 70–99)
Glucose-Capillary: 191 mg/dL — ABNORMAL HIGH (ref 70–99)

## 2023-05-01 NOTE — Progress Notes (Addendum)
KETAN, MUELLER ([id number]) [id number] Page 1 of 8 Visit Report for 04/30/2023 Debridement Details Patient Name: Date of Service: Billy RNO [name], [name] 04/30/2023 12:15 PM Medical Record Number: [id number] Patient Account Number: [id number] Date of Birth/Sex: Treating RN: [birth year] (77 y.o. [name] Primary Care Provider: [name] Other Clinician: Referring Provider: Treating Provider/Extender: [name] [facility], [place] in Treatment: 9 Debridement Performed for Assessment: Wound #1 Left,Posterior Calcaneus Performed By: Physician Maxwell Caul., MD The following information was scribed by: Zenaida Deed The information was scribed for: [name] Debridement Type: Debridement Severity of Tissue Pre Debridement: Fat layer exposed Level of Consciousness (Pre-procedure): Awake and Alert Pre-procedure Verification/Time Out Yes - 13:10 Taken: Start Time: 13:10 Pain Control: Lidocaine 4% T opical Solution Percent of Wound Bed Debrided: 100% T Area Debrided (cm): otal 0.28 Tissue and other material debrided: Viable, Non-Viable, Slough, Subcutaneous, Slough Level: Skin/Subcutaneous Tissue Debridement Description: Excisional Instrument: Curette Bleeding: Minimum Hemostasis Achieved: Pressure Procedural Pain: 0 Post Procedural Pain: 0 Response to Treatment: Procedure was tolerated well Level of Consciousness (Post- Awake and Alert procedure): Post Debridement Measurements of Total Wound Length: (cm) 0.9 Width: (cm) 0.4 Depth: (cm) 0.2 Volume: (cm) 0.057 Character of Wound/Ulcer Post Debridement: Improved Severity of Tissue Post Debridement: Fat layer exposed Post Procedure Diagnosis Same as Pre-procedure Electronic Signature(s) Signed: 04/30/2023 4:57:48 PM By: Zenaida Deed RN, BSN Signed: 05/01/2023 10:03:28 AM By: [name] MD Entered By: Zenaida Deed on 04/30/2023  13:17:09 -------------------------------------------------------------------------------- HPI Details Patient Name: Date of Service: [name], [name] 04/30/2023 12:15 PM Medical Record Number: [id number] Patient Account Number: [id number] Date of Birth/Sex: Treating RN: [birth year] (78 y.o. M) Primary Care Provider: [name] Other Clinician: Referring Provider: Treating Provider/Extender: Kathaleen Grinder in Treatment: [address] [place] ([id number]) [id number] Page 2 of 8 History of Present Illness HPI Description: ADMISSION 02/26/2023 Patient is Billy 77 year old man with type 2 diabetes. He has had Billy 20-month history of wounds on his bilateral heels just above the heel tips. They have been using Iodoflex/Iodosorb ointment. The patient has Billy history of PAD and has Billy SFA stent on the right. He also also status post left iliac stent. Furthermore he has Billy history of carotid artery stenosis status post right carotid endarterectomy and Billy left subclavian artery stenosis. His wound started on his blisters on the heel that of open to wounds with minimal but some depth right greater than left. He also has advanced diabetic neuropathy. The patient is cared for through the [place] and has been referred here for hyperbaric oxygen treatment through his vascular surgeon. Past medical history includes hypertension, hyperlipidemia, type 2 diabetes chronic kidney disease stage IIIb, carotid artery stenosis status post right carotid endarterectomy, left subclavian artery stenosis ABIs done in our clinic were 1.11 on the right and 0.75 on the left these were remarkably similar to what was quoted during the vascular surgery appointment from 02/05/2023. 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 He was referred for hyperbaric oxygen by the [place]. His chest x-ray I think shows vascular crowding but no  pneumothorax. He has been using Iodoflex on the wound with reasonably good improvement this week. He sees his ENT in 2 days and then they will make Billy decision about when they want to start HBO 10/24; patient is started HBO I believe he is on treatment 5 today. He had some problems with his right  ear on the second dive however he has had no problems since and says he feels well. He slept through treatment yesterday. He has been using Iodosorb ointment on the wounds on both heels for Billy long period even before he came here. Unfortunately the wound surface still does not look completely as viable as I would like. 11/5; he continues to tolerate HBO fairly well although he is having some fullness in his left ear that is reversed by popping his left ear. He has been using Sorbact hydrogel. He comes in today with the wounds quite Billy bit smaller especially on the right but also on the left. He is fairly rigorous and offloading these with heel offloading shoes. 11/11; right posterior calcaneus is closed down. The area on the left calcaneus laterally still has some depth. We have been using Sorbact on both sides. He has completed 12 out of 40 treatments 11/19; right posterior calcaneus is still closed the area on the left is quite Billy bit smaller. We have been using Sorbact on the left heel. He is still receiving HBO which was requested by the [place]. These wounds may be healed soon 12/3; the only remaining wound is the left posterior calcaneus and it is smaller. We've been using sore act. He is continuing HBO. On presentation he had Billy similar wound on the right posterior heel which is healed two or three weeks ago. 12/10; left posterior lateral calcaneus. Unfortunately no improvement today. Using Sorbact and hydrogel her intake nurses saying that the Sorbact was not adherent to the wound. He is continuing in [facility]) Signed: 05/01/2023 10:03:28 AM By: [name] MD Entered By: [name] on 04/30/2023 13:10:27 -------------------------------------------------------------------------------- Physical Exam Details Patient Name: Date of Service: [name], [name] 04/30/2023 12:15 PM Medical Record Number: [id number] Patient Account Number: [id number] Date of Birth/Sex: Treating RN: 1945/12/11 (77 y.o. M) Primary Care Provider: [name] Other Clinician: Referring Provider: Treating Provider/Extender: Kathaleen Grinder in Treatment: 9 Constitutional Patient is hypertensive.. Pulse regular and within target range for patient.Marland Kitchen Respirations regular, non-labored and within target range.. Temperature is normal and within the target range for the patient.Marland Kitchen Appears in no distress. Notes Wound exam; small wound on the posterior lateral left heel. Granulation tissue looks reasonably healthy however there is surface slough that required debridement with Billy #3 curette. Hemostasis with direct pressure. No evidence of surrounding infection this does not probe to bone Electronic Signature(s) Signed: 05/01/2023 10:03:28 AM By: [name] MD Entered By: [name] on 04/30/2023 13:11:48 [name], [name] ([id number]) [id number] Page 3 of 8 -------------------------------------------------------------------------------- Physician Orders Details Patient Name: Date of Service: Billy RNO [name], [name] 04/30/2023 12:15 PM Medical Record Number: [id number] Patient Account Number: [id number] Date of Birth/Sex: Treating RN: 01-24-1946 (77 y.o. 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 - DR. Leanord Hawking Return Billy 12/17 @ 12:30 pm Other: - ****Will skip wound care appt week of Thanksgiving. Will continue Hyperbarics.**** Once wounds  close will discontinue hyperbarics. Wear shoe to right foot for protection and keep cover for protection 1-2 weeks. Anesthetic (In clinic) Topical Lidocaine 4% applied to wound bed - In clinic Bathing/ Shower/ Hygiene May shower and wash wound with soap and water. - with dressing changes Off-Loading Wound #1 Left,Posterior Calcaneus Wedge shoe to: - Off loading shoes bilaterally to alleviate pressure from heels. Turn and reposition every 2 hours Prevalon Boot - bilaterally at bedtime Hyperbaric Oxygen Therapy Wound #1 Left,Posterior Calcaneus Evaluate for HBO Therapy - Per patient, he has claustrophobia. Evaluate for HBO Therapy - Per patient, he has claustrophobia. If appropriate for treatment, begin HBOT per protocol: 2.0 ATA for 90 Minutes without Billy Breaks ir 2.0 ATA for 90 Minutes without Billy Breaks ir Total Number of Treatments: - 40 One treatments per day (delivered Monday through Friday unless otherwise specified in Special Instructions below): One treatments per day (delivered Monday through Friday unless otherwise specified in Special Instructions below): Finger stick Blood Glucose Pre- and Post- HBOT Treatment. Finger stick Blood Glucose Pre- and Post- HBOT Treatment. Follow Hyperbaric Oxygen Glycemia Protocol Follow Hyperbaric Oxygen Glycemia Protocol Give two 4oz orange juices in addition to Glucerna when the glycemic protocol is used. Give two 4oz orange juices in addition to Glucerna when the glycemic protocol  is used. Billy frin (Oxymetazoline HCL) 0.05% nasal spray - 1 spray in both nostrils daily as needed prior to HBO treatment for difficulty clearing ears Billy frin (Oxymetazoline HCL) 0.05% nasal spray - 1 spray in both nostrils daily as needed prior to HBO treatment for difficulty clearing ears Wound Treatment Wound #1 - Calcaneus Wound Laterality: Left, Posterior Cleanser: Wound Cleanser Every Other Day/30 Days Discharge Instructions: Cleanse the wound with wound cleanser prior to applying Billy clean dressing using gauze sponges, not tissue or cotton balls. Peri-Wound Care: Skin Prep Every Other Day/30 Days Discharge Instructions: Use skin prep as directed Prim Dressing: Endoform 2x2 in Every Other Day/30 Days ary Discharge Instructions: Moisten with saline Secondary Dressing: Drawtex 4x4 in Every Other Day/30 Days MCCRAY, ANGERMEIER ([id number]) [id number] Page 4 of 8 Discharge Instructions: Apply over primary dressing cut to fit inside wound Secondary Dressing: Zetuvit Plus Silicone Border Dressing 3x3 (in/in) Every Other Day/30 Days Discharge Instructions: Apply silicone border over primary dressing as directed. GLYCEMIA INTERVENTIONS PROTOCOL PRE-HBO GLYCEMIA INTERVENTIONS ACTION INTERVENTION Obtain pre-HBO capillary blood glucose (ensure 1 physician order is in chart). Billy. Notify HBO physician and await physician orders. 2 If result is 70 mg/dl or below: B. If the result meets the hospital definition of Billy critical result, follow hospital policy. Billy. Give patient an 8 ounce Glucerna Shake, an 8 ounce Ensure, or 8 ounces of Billy Glucerna/Ensure equivalent dietary supplement*. B. Wait 30 minutes. If result is 71 mg/dl to 782 mg/dl: C. Retest patients capillary blood glucose (CBG). D. If result greater than or equal to 110 mg/dl, proceed with HBO. If result less than 110 mg/dl, notify HBO physician and consider holding HBO. If result is 131 mg/dl to 956 mg/dl: Billy.  Proceed with HBO. Billy. Notify HBO physician and await physician orders. B. It is recommended to hold HBO and do If result is 250 mg/dl or greater: blood/urine ketone testing. C. If the result meets the hospital definition of Billy critical result, follow hospital policy. POST-HBO GLYCEMIA INTERVENTIONS ACTION INTERVENTION Obtain post HBO capillary blood glucose (ensure 1 physician order is in chart). Billy. Notify HBO physician and await physician orders. 2 If result is 70 mg/dl or below:  B. If the result meets the hospital definition of Billy critical result, follow hospital policy. Billy. Give patient an 8 ounce Glucerna Shake, an 8 ounce Ensure, or 8 ounces of Billy Glucerna/Ensure equivalent dietary supplement*. B. Wait 15 minutes for symptoms of If result is 71 mg/dl to 295 mg/dl: hypoglycemia (i.e. nervousness, anxiety, sweating, chills, clamminess, irritability, confusion, tachycardia or dizziness). C. If patient asymptomatic, discharge patient. If patient symptomatic, repeat capillary blood glucose (CBG) and notify HBO physician. If result is 101 mg/dl to 284 mg/dl: Billy. Discharge patient. Billy. Notify HBO physician and await physician orders. B. It is recommended to do blood/urine ketone If result is 250 mg/dl or greater: testing. C. If the result meets the hospital definition of Billy critical result, follow hospital policy. *Juice or candies are NOT equivalent products. If patient refuses the Glucerna or Ensure, please consult the hospital dietitian for an appropriate substitute. Electronic Signature(s) Signed: 04/30/2023 4:57:48 PM By: Zenaida Deed RN, BSN Signed: 05/01/2023 10:03:28 AM By: [name] MD Entered By: Zenaida Deed on 04/30/2023 13:21:11 -------------------------------------------------------------------------------- Problem List Details Patient Name: Date of Service: [name], [name] 04/30/2023 12:15 PM Medical Record Number: [id number] Patient Account Number:  [id number] Date of Birth/Sex: Treating RN: 02/10/1946 (77 y.o. 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 with other specified 02/26/2023 No Yes severity E11.42 Type 2 diabetes mellitus with diabetic polyneuropathy 02/26/2023 No Yes E11.51 Type 2 diabetes mellitus with diabetic peripheral angiopathy without gangrene 02/26/2023 No Yes Inactive Problems ICD-10 Code Description Active Date Inactive Date 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 02/26/2023 02/26/2023 Resolved Problems Electronic Signature(s) Signed: 05/01/2023 10:03:28 AM By: [name] MD Entered By: [name] on 04/30/2023 13:09:13 -------------------------------------------------------------------------------- Progress Note Details Patient Name: Date of Service: [name], [name] 04/30/2023 12:15 PM Medical Record Number: [id number] Patient Account Number: [id number] Date of Birth/Sex: Treating RN: August 01, 1945 (77 y.o. M) Primary Care Provider: [name] Other Clinician: Referring Provider: Treating Provider/Extender: [name] [facility], [place] in Treatment: 9 Subjective History of Present Illness (HPI) ADMISSION 02/26/2023 Patient is Billy 77 year old man with type 2 diabetes. He has had Billy 77-month history of wounds on his bilateral heels just above the heel tips. They have been using Iodoflex/Iodosorb ointment. The patient has Billy history of PAD and has Billy SFA stent on the right. He also also status post left iliac  stent. Furthermore he has Billy history of carotid artery stenosis status post right carotid endarterectomy and Billy left subclavian artery stenosis. His wound started on his blisters on the heel that of open to wounds with minimal but some depth right greater than left. He also has advanced diabetic neuropathy. The patient is cared for through the [place] and has been referred here for hyperbaric oxygen treatment through his vascular surgeon. Past medical history includes hypertension, hyperlipidemia, type 2 diabetes chronic kidney disease stage IIIb, carotid artery stenosis status post right carotid endarterectomy, left subclavian artery stenosis ABIs done in our clinic were 1.11 on the right and 0.75 on the left these were remarkably similar to what was quoted during the vascular surgery appointment from 02/05/2023. 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 He was referred for hyperbaric oxygen by the [place]. His chest x-ray I think shows vascular crowding but no pneumothorax. He has been using Iodoflex on the wound with reasonably good improvement this week. [name], [name] ([id number]) [id number] Page 6 of 8 He sees his ENT in 2 days and then they will make Billy decision about when they want to start HBO 10/24; patient is started HBO I believe he is on treatment 5 today. He had some problems with his right ear on the second dive however he has had no problems since and says he feels well. He slept through treatment yesterday. He has been using Iodosorb ointment on the wounds on both heels for Billy long period even before he came here. Unfortunately the wound surface still does not look completely as viable as I would like. 11/5; he continues to tolerate HBO fairly well although he is having some fullness in his left ear that is reversed by popping his left ear. He has been using Sorbact hydrogel. He comes in today with the wounds  quite Billy bit smaller especially on the right but also on the left. He is fairly rigorous and offloading these with heel offloading shoes. 11/11; right posterior calcaneus is closed down. The area on the left calcaneus laterally still has some depth. We have been using Sorbact on both sides. He has completed 12 out of 40 treatments 11/19; right posterior calcaneus is still closed the area on the left is quite Billy bit smaller. We have been using Sorbact on the left heel. He is still receiving HBO which was requested by the [place]. These wounds may be healed soon 12/3; the only remaining wound is the left posterior calcaneus and it is smaller. We've been using sore act. He is continuing HBO. On presentation he had Billy similar wound on the right posterior heel which is healed two or three weeks ago. 12/10; left posterior lateral calcaneus. Unfortunately no improvement today. Using Sorbact and hydrogel her intake nurses saying that the Sorbact was not adherent to the wound. He is continuing in HBO Objective Constitutional Patient is hypertensive.. Pulse regular and within target range for patient.Marland Kitchen Respirations regular, non-labored and within target range.. Temperature is normal and within the target range for the patient.Marland Kitchen Appears in no distress. Vitals Time Taken: 12:48 PM, Height: 70 in, Weight: 196 lbs, BMI: 28.1, Temperature: 97.9 F, Pulse: 72 bpm, Respiratory Rate: 18 breaths/min, Blood Pressure: 177/69 mmHg. General Notes: Wound exam; small wound on the posterior lateral left heel. Granulation tissue looks reasonably healthy however there is surface slough that required debridement with Billy #3 curette. Hemostasis with direct pressure. No evidence of surrounding infection this does not probe to bone Integumentary (Hair, Skin) Wound #1 status is Open. Original cause of wound was Blister. The date acquired was: 01/20/2023. The wound has been in treatment 9 weeks. The wound is located on the Left,Posterior  Calcaneus. The wound measures 0.9cm length x 0.4cm width x 0.2cm depth; 0.283cm^2 area and 0.057cm^3 volume. There is Fat Layer (Subcutaneous Tissue) exposed. There is no tunneling or undermining noted. There is Billy medium amount of serosanguineous drainage noted. The wound margin is distinct with the outline attached to the wound base. There is small (1-33%) red, pink granulation within the wound bed. There is Billy large (67-100%) amount of necrotic tissue within the wound bed including Adherent Slough. The periwound skin appearance had no abnormalities noted  for texture. The periwound skin appearance had no abnormalities noted for moisture. The periwound skin appearance had no abnormalities noted for color. Periwound temperature was noted as No Abnormality. The periwound has tenderness on palpation. 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 There was Billy Excisional Skin/Subcutaneous Tissue Debridement with Billy total area of 0.28 sq cm performed by Maxwell Caul., MD. With the following instrument(s): Curette to remove Viable and Non-Viable tissue/material. Material removed includes Subcutaneous Tissue and Slough and after achieving pain control using Lidocaine 4% T opical Solution. No specimens were taken. Billy time out was conducted at 13:10, prior to the start of the procedure. Billy Minimum amount of bleeding was controlled with Pressure. The procedure was tolerated well with Billy pain level of 0 throughout and Billy pain level of 0 following the procedure. Post  Debridement Measurements: 0.9cm length x 0.4cm width x 0.2cm depth; 0.057cm^3 volume. Character of Wound/Ulcer Post Debridement is improved. Severity of Tissue Post Debridement is: Fat layer exposed. Post procedure Diagnosis Wound #1: Same as Pre-Procedure [name], [name] ([id number]) [id number] Page 7 of 8 Plan Follow-up Appointments: Return Appointment in 1 week. - DR. Leanord Hawking 12/17 @ 12:30 pm Other: - ****Will skip wound care appt week of Thanksgiving. Will continue Hyperbarics.**** Once wounds close will discontinue hyperbarics. Wear shoe to right foot for protection and keep cover for protection 1-2 weeks. Anesthetic: (In clinic) Topical Lidocaine 4% applied to wound bed - In clinic Bathing/ Shower/ Hygiene: May shower and wash wound with soap and water. - with dressing changes Off-Loading: Wound #1 Left,Posterior Calcaneus: Wedge shoe to: - Off loading shoes bilaterally to alleviate pressure from heels. Turn and reposition every 2 hours Prevalon Boot - bilaterally at bedtime Hyperbaric Oxygen Therapy: Wound #1 Left,Posterior Calcaneus: Evaluate for HBO Therapy - Per patient, he has claustrophobia. Evaluate for HBO Therapy - Per patient, he has claustrophobia. If appropriate for treatment, begin HBOT per protocol: 2.0 ATA for 90 Minutes without Air Breaks 2.0 ATA for 90 Minutes without Air Breaks T Number of Treatments: - 40 otal One treatments per day (delivered Monday through Friday unless otherwise specified in Special Instructions below): One treatments per day (delivered Monday through Friday unless otherwise specified in Special Instructions below): Finger stick Blood Glucose Pre- and Post- HBOT Treatment. Finger stick Blood Glucose Pre- and Post- HBOT Treatment. Follow Hyperbaric Oxygen Glycemia Protocol Follow Hyperbaric Oxygen Glycemia Protocol Give two 4oz orange juices in addition to Glucerna when the glycemic protocol is used. Give two  4oz orange juices in addition to Glucerna when the glycemic protocol is used. Afrin (Oxymetazoline HCL) 0.05% nasal spray - 1 spray in both nostrils daily as needed prior to HBO treatment for difficulty clearing ears Afrin (Oxymetazoline HCL) 0.05% nasal spray - 1 spray in both nostrils daily as needed prior to HBO treatment for difficulty clearing ears WOUND #1: - Calcaneus Wound Laterality: Left, Posterior Cleanser: Wound Cleanser Every Other Day/30 Days Discharge Instructions: Cleanse the wound with wound cleanser prior to applying Billy clean dressing using gauze sponges, not tissue or cotton balls. Peri-Wound Care: Skin Prep Every Other Day/30 Days Discharge Instructions: Use skin prep  as directed Prim Dressing: Endoform 2x2 in Every Other Day/30 Days ary Discharge Instructions: Moisten with saline Secondary Dressing: Drawtex 4x4 in Every Other Day/30 Days Discharge Instructions: Apply over primary dressing cut to fit inside wound Secondary Dressing: Zetuvit Plus Silicone Border Dressing 3x3 (in/in) Every Other Day/30 Days Discharge Instructions: Apply silicone border over primary dressing as directed. 1. I change the primary dressing to endoform 2. We will back this with drawtex and endoform. 3. Continue HBO Electronic Signature(s) Signed: 05/02/2023 5:28:21 PM By: Shawn Stall RN, BSN Signed: 05/06/2023 4:04:15 PM By: [name] MD Previous Signature: 05/01/2023 10:03:28 AM Version By: [name] MD Entered By: Shawn Stall on 05/02/2023 17:21:22 -------------------------------------------------------------------------------- SuperBill Details Patient Name: Date of Service: Billy RNO [name], [name] 04/30/2023 Medical Record Number: [id number] Patient Account Number: [id number] Date of Birth/Sex: Treating RN: Oct 03, 1945 (77 y.o. 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 Type 2 diabetes mellitus with diabetic peripheral angiopathy without gangrene Facility Procedures : CPT4 Code: 09233007 Description: 11042 - DEB SUBQ TISSUE 20 SQ CM/< ICD-10 Diagnosis Description 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 Modifier: rity Quantity: 1 Physician Procedures : CPT4 Code Description Modifier [id number] - WC PHYS LEVEL 3 - EST PT ICD-10 Diagnosis Description 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

## 2023-05-01 NOTE — Progress Notes (Signed)
[name], [name] ([id number]) [id number] Page 1 of 1 Visit Report for 04/30/2023 SuperBill Details Patient Name: Date of Service: [name], [name] 04/30/2023 Medical Record Number: [id number] Patient Account Number: [id number] Date of Birth/Sex: Treating RN: 07-15-1945 (77 y.o. 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 E11.51 Type 2 diabetes mellitus with diabetic peripheral angiopathy without gangrene Facility Procedures CPT4 Code Description Modifier Quantity [id number]-(Facility Use Only) HBOT full body chamber, , 4 ICD-10 Diagnosis Description 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

## 2023-05-01 NOTE — Progress Notes (Addendum)
EULAS, PLASENCIA ([id number]) [id number] Page 1 of 3 Visit Report for 05/01/2023 HBO Details Patient Name: Date of Service: [name], [name] 05/01/2023 12:00 PM Medical Record Number: [id number] Patient Account Number: [id number] Date of Birth/Sex: Treating RN: 09-Jan-1946 (77 y.o. [name] Primary Care Sindia Kowalczyk: [name] Other Clinician: [name] Referring Nuha Degner: Treating Brayson Livesey/Extender: [name] [place] in Treatment: 9 HBO Treatment Course Details Treatment Course Number: 1 Ordering Dustyn Dansereau: [name] T Treatments Ordered: otal 40 HBO Treatment Start Date: 03/11/2023 HBO Indication: Diabetic Ulcer(s) of the Lower Extremity Standard/Conservative Wound Care tried and failed greater than or equal to 30 days Wound #1 Left, Posterior Calcaneus , Wound #2 Right, Posterior Calcaneus HBO Treatment Details Treatment Number: 26 Patient Type: Outpatient Chamber Type: Monoplace Chamber Serial #: [id number] Treatment Protocol: 2.0 ATA with 90 minutes oxygen, and no air breaks Treatment Details Compression Rate Down: 1.5 psi / minute De-Compression Rate Up: 2.0 psi / minute Air breaks and breathing Decompress Decompress Compress Tx Pressure Begins Reached periods Begins Ends (leave unused spaces blank) Chamber Pressure (ATA 1 2 ------2 1 ) Clock Time (24 hr) 12:18 12:28 - - - - - - 13:58 14:06 Treatment Length: 108 (minutes) Treatment Segments: 4 Vital Signs Capillary Blood Glucose Reference Range: 80 - 120 mg / dl HBO Diabetic Blood Glucose Intervention Range: <131 mg/dl or >308 mg/dl Type: Time Vitals Blood Respiratory Capillary Blood Glucose Pulse Action Pulse: Temperature: Taken: Pressure: Rate: Glucose (mg/dl): Meter #: Oximetry (%) Taken: Pre 11:57 124/70 70 18 97.4 227 none per protocol Post 14:09 154/69 61 18 98.4 191 none per protocol Treatment Response Treatment  Toleration: Well Treatment Completion Status: Treatment Completed without Adverse Event Treatment Notes [name] arrived with normal vital signs. He prepared for treatment. After performing a safety check, the patient was placed in the chamber which was compressed with 100% oxygen at the rate of 1.5 psi/min after confirming normal ear equalization. He tolerated the treatment and the subsequent decompression of the chamber at the rate of 2 psi/min. His post-treatment vital signs were within normal range. He was stable upon discharge. Additional Procedure Documentation Tissue [name] Kartel Wolbert Notes No concerns with treatment given Physician HBO Attestation: I certify that I supervised this HBO treatment in accordance with Medicare guidelines. A trained emergency response team is readily available per Yes hospital policies and procedures. CLARON, CAPPELL ([id number]) [id number] Page 2 of 3 Continue HBOT as ordered. Yes Electronic Signature(s) Signed: 05/01/2023 5:18:28 PM By: [name] MD Previous Signature: 05/01/2023 4:36:28 PM Version By: [name] CHT EMT BS , , Entered By: [name] on 05/01/2023 14:16:53 -------------------------------------------------------------------------------- HBO Safety Checklist Details Patient Name: Date of Service: [name], [name] 05/01/2023 12:00 PM Medical Record Number: [id number] Patient Account Number: [id number] Date of Birth/Sex: Treating RN: [birth year] (77 y.o. [name] Primary Care Vale Mousseau: [name] Other Clinician: [name] Referring Lear Carstens: Treating Kolden Dupee/Extender: [name] [place] in Treatment: 9 HBO Safety Checklist Items Safety Checklist Consent Form Signed Patient voided / foley secured and emptied 1030 2 Egg, Grit, Biscuit, [place] Sausage, When did you last eato Gravy Last dose of injectable or oral  agent 0830 Metformin Ostomy pouch emptied and vented if applicable NA All implantable devices assessed, documented and approved NA Intravenous access site secured and place NA Valuables secured Linens and cotton and cotton/polyester blend (less than 51% polyester) Personal oil-based products / skin lotions / body lotions removed Wigs  or hairpieces removed NA Smoking or tobacco materials removed NA Books / newspapers / magazines / loose paper removed Cologne, aftershave, perfume and deodorant removed Jewelry removed (may wrap wedding band) Make-up removed NA Hair care products removed Battery operated devices (external) removed Heating patches and chemical warmers removed Titanium eyewear removed Nail polish cured greater than 10 hours Casting material cured greater than 10 hours NA Hearing aids removed at home Loose dentures or partials removed dentures removed Prosthetics have been removed NA Patient demonstrates correct use of air break device (if applicable) Patient concerns have been addressed Patient grounding bracelet on and cord attached to chamber Specifics for Inpatients (complete in addition to above) Medication sheet sent with patient NA Intravenous medications needed or due during therapy sent with patient NA Drainage tubes (e.g. nasogastric tube or chest tube secured and vented) NA Endotracheal or Tracheotomy tube secured NA Cuff deflated of air and inflated with saline NA Airway suctioned NA Notes Paper version used prior to treatment start. Electronic Signature(s) Signed: 05/01/2023 1:54:39 PM By: [name] CHT EMT BS , , Previous Signature: 05/01/2023 1:54:09 PM Version By: [name] CHT EMT BS , , CAELIN, OPPY ([id number]) [id number] Page 3 of 3 Previous Signature: 05/01/2023 1:54:09 PM Version By: [name] CHT EMT BS , , Entered By: [name] on 05/01/2023 10:54:39

## 2023-05-01 NOTE — Progress Notes (Signed)
JAHMARION, BARTUSEK ([id number]) [id number] Page 1 of 2 Visit Report for 05/01/2023 Arrival Information Details Patient Name: Date of Service: [name], [name] 05/01/2023 12:00 PM Medical Record Number: [id number] Patient Account Number: [id number] Date of Birth/Sex: Treating RN: Sep 23, 1945 (77 y.o. [name] Primary Care Annaleese Guier: [name] Other Clinician: [name] Referring Darshawn Boateng: Treating Lorenso Quirino/Extender: [name] [place] in Treatment: 9 Visit Information History Since Last Visit All ordered tests and consults were completed: Yes Patient Arrived: [name] Added or deleted any medications: No Arrival Time: 11:37 Any new allergies or adverse reactions: No Accompanied By: spouse Had a fall or experienced change in No Transfer Assistance: None activities of daily living that may affect Patient Identification Verified: Yes risk of falls: Secondary Verification Process Completed: Yes Signs or symptoms of abuse/neglect since last visito No Patient Requires Transmission-Based Precautions: No Hospitalized since last visit: No Patient Has Alerts: No Implantable device outside of the clinic excluding No cellular tissue based products placed in the center since last visit: Pain Present Now: No Electronic Signature(s) Signed: 05/01/2023 1:51:42 PM By: [name] CHT EMT BS , , Entered By: [name] on 05/01/2023 10:51:42 -------------------------------------------------------------------------------- Encounter Discharge Information Details Patient Name: Date of Service: [name], [name] 05/01/2023 12:00 PM Medical Record Number: [id number] Patient Account Number: [id number] Date of Birth/Sex: Treating RN: [birth year] (77 y.o. [name] Primary Care Demita Tobia: [name] Other Clinician: [name] Referring Zymier Rodgers: Treating Johnisha Louks/Extender: Kath[facility] Grinder in Treatment: 9 Encounter Discharge Information Items Discharge Condition: Stable Ambulatory Status: Ambulatory Discharge Destination: Home Transportation: Private Auto Accompanied By: spouse Schedule Follow-up Appointment: No Clinical Summary of Care: Electronic Signature(s) Signed: 05/01/2023 4:37:48 PM By: [name] CHT EMT BS , , Entered By: [name] on 05/01/2023 13:37:48 Sellers, Les Pou ([id number]) [id number] Page 2 of 2 -------------------------------------------------------------------------------- Vitals Details Patient Name: Date of Service: [name], [name] 05/01/2023 12:00 PM Medical Record Number: [id number] Patient Account Number: [id number] Date of Birth/Sex: Treating RN: [birth year] (77 y.o. [name] Primary Care Kregg Cihlar: [name] Other Clinician: [name] Referring Giavonni Fonder: Treating Kamylle Axelson/Extender: [name] [place] in Treatment: 9 Vital Signs Time Taken: 11:57 Temperature (F): 97.4 Height (in): 70 Pulse (bpm): 70 Weight (lbs): 196 Respiratory Rate (breaths/min): 18 Body Mass Index (BMI): 28.1 Blood Pressure (mmHg): 124/70 Capillary Blood Glucose (mg/dl): 295 Reference Range: 80 - 120 mg / dl Electronic Signature(s) Signed: 05/01/2023 1:52:23 PM By: [name] CHT EMT BS , , Entered By: [name] on 05/01/2023 10:52:22

## 2023-05-01 NOTE — Progress Notes (Signed)
[name] ([id number]) [id number] Page 1 of 7 Visit Report for 04/30/2023 Arrival Information Details Patient Name: Date of Service: [name], [name] 04/30/2023 12:15 PM Medical Record Number: [id number] Patient Account Number: [id number] Date of Birth/Sex: Treating RN: 17-Aug-1945 (77 y.o. [name] Primary Care [name]: Clinic, [name] Other Clinician: Referring [name]: Treating [name]/Extender: [name] [facility], [name] in Treatment: 9 Visit Information History Since Last Visit Added or deleted any medications: No Patient Arrived: [name] Any new allergies or adverse reactions: No Arrival Time: 12:47 Had Billy fall or experienced change in No Accompanied By: spouse activities of daily living that may affect Transfer Assistance: None risk of falls: Patient Identification Verified: Yes Signs or symptoms of abuse/neglect since last No Secondary Verification Process Completed: Yes visito Patient Requires Transmission-Based Precautions: No Hospitalized since last visit: No Patient Has Alerts: No Implantable device outside of the clinic excluding No cellular tissue based products placed in the center since last visit: Has Dressing in Place as Prescribed: Yes Has Footwear/Offloading in Place as Prescribed: Yes Left: Other:heel offloading sandals Right: Other:heel offloading sandals Pain Present Now: No Electronic Signature(s) Signed: 04/30/2023 4:57:48 PM By: Billy Deed RN, BSN Entered By: [name] on 04/30/2023 09:48:25 -------------------------------------------------------------------------------- Encounter Discharge Information Details Patient Name: Date of Service: [name], [name] 04/30/2023 12:15 PM Medical Record Number: [id number] Patient Account Number: [id number] Date of Birth/Sex: Treating RN: [birth year] (77 y.o. [name] Primary Care Orabelle Rylee: Clinic, [name] Other  Clinician: Referring [name]: Treating [name]/Extender: [name] in Treatment: 9 Encounter Discharge Information Items Post Procedure Vitals Discharge Condition: Stable Temperature (F): 97.9 Ambulatory Status: Walker Pulse (bpm): 72 Discharge Destination: Home Respiratory Rate (breaths/min): 18 Transportation: Private Auto Blood Pressure (mmHg): 177/69 Accompanied By: spouse Schedule Follow-up Appointment: Yes Clinical Summary of Care: Patient Declined Electronic Signature(s) Signed: 04/30/2023 4:57:48 PM By: Billy Deed RN, BSN Entered By: [name] on 04/30/2023 10:22:58 [name] ([id number]) [id number] Page 2 of 7 -------------------------------------------------------------------------------- Lower Extremity Assessment Details Patient Name: Date of Service: [name], [name] 04/30/2023 12:15 PM Medical Record Number: [id number] Patient Account Number: [id number] Date of Birth/Sex: Treating RN: 20-Oct-1945 (77 y.o. [name] Primary Care Dagmar Adcox: Clinic, [name] Other Clinician: Referring Raima Geathers: Treating [name]/Extender: [name] [facility], [name] in Treatment: 9 Edema Assessment Assessed: [Left: No] [Right: No] Edema: [Left: N] [Right: o] Calf Left: Right: Point of Measurement: From Medial Instep 32 cm Ankle Left: Right: Point of Measurement: From Medial Instep 22 cm Vascular Assessment Pulses: Dorsalis Pedis Palpable: [Left:Yes] Extremity colors, hair growth, and conditions: Extremity Color: [Left:Normal] Hair Growth on Extremity: [Left:No] Temperature of Extremity: [Left:Warm] Capillary Refill: [Left:< 3 seconds] Dependent Rubor: [Left:No No] Electronic Signature(s) Signed: 04/30/2023 4:57:48 PM By: Billy Deed RN, BSN Entered By: [name] on 04/30/2023  09:55:21 -------------------------------------------------------------------------------- Multi Wound Chart Details Patient Name: Date of Service: [name], [name] 04/30/2023 12:15 PM Medical Record Number: [id number] Patient Account Number: [id number] Date of Birth/Sex: Treating RN: [birth year] (77 y.o. M) Primary Care [name]: Clinic, [name] Other Clinician: Referring Mele Sylvester: Treating [name]/Extender: [name] [facility], [name] in Treatment: 9 Vital Signs Height(in): 70 Pulse(bpm): 72 Weight(lbs): 196 Blood Pressure(mmHg): 177/69 Body Mass Index(BMI): 28.1 Temperature(F): 97.9 Respiratory Rate(breaths/min): 18 [1:Photos:] [[name]:[name]] Left, Posterior Calcaneus [name] [name] Wound Location: Blister [name] [name] Wounding Event: Diabetic Wound/Ulcer of the Lower [name] [name] Primary Etiology: Extremity Arrhythmia, Coronary Artery Disease, [name] [name] Comorbid History: Hypertension, Peripheral Venous Disease, Type II  Diabetes, Osteoarthritis, Neuropathy 01/20/2023 [name] [name] Date Acquired: 9 [name] [name] Weeks of Treatment: Open [name] [name] Wound Status: No [name] [name] Wound Recurrence: 0.9x0.4x0.2 [name] [name] Measurements L x W x D (cm) 0.283 [name] [name] Billy (cm) : rea 0.057 [name] [name] Volume (cm) : 83.30% [name] [name] % Reduction in Billy rea: 83.20% [name] [name] % Reduction in Volume: Grade 3 [name] [name] Classification: Medium [name] [name] Exudate Billy mount: Serosanguineous [name] [name] Exudate Type: red, brown [name] [name] Exudate Color: Distinct, outline attached [name] [name] Wound Margin: Small (1-33%) [name] [name] Granulation Billy mount: Red, Pink [name] [name] Granulation Quality: Large (67-100%) [name] [name] Necrotic Billy mount: Fat Layer (Subcutaneous Tissue): Yes [name] [name] Exposed Structures: Fascia: No Tendon: No Muscle: No Joint: No Bone: No Small (1-33%) [name] [name] Epithelialization: No Abnormalities Noted [name] [name] Periwound Skin Texture: Maceration: No [name] [name] Periwound Skin Moisture: Dry/Scaly: No No  Abnormalities Noted [name] [name] Periwound Skin Color: No Abnormality [name] [name] Temperature: Yes [name] [name] Tenderness on Palpation: Treatment Notes Electronic Signature(s) Signed: 05/01/2023 10:03:28 AM By: Billy Najjar MD Entered By: [name] on 04/30/2023 10:09:19 -------------------------------------------------------------------------------- Multi-Disciplinary Care Plan Details Patient Name: Date of Service: [name], [name] 04/30/2023 12:15 PM Medical Record Number: [id number] Patient Account Number: [id number] Date of Birth/Sex: Treating RN: 1946-03-22 (77 y.o. [name] Primary Care Anyely Cunning: Clinic, [name] Other Clinician: Referring Daijanae Rafalski: Treating Yona Kosek/Extender: [name] in Treatment: 9 Multidisciplinary Care Plan reviewed with physician Active Inactive Wound/Skin Impairment Nursing Diagnoses: Knowledge deficit related to smoking impact on wound healing [name], [name] ([id number]) [phone].pdf Page 4 of 7 Goals: Patient/caregiver will verbalize understanding of skin care regimen Date Initiated: 02/26/2023 Target Resolution Date: 05/17/2023 Goal Status: Active Interventions: Assess patient/caregiver ability to obtain necessary supplies Assess patient/caregiver ability to perform ulcer/skin care regimen upon admission and as needed Assess ulceration(s) every visit Provide education on ulcer and skin care Screen for HBO Treatment Activities: Consult for HBO : 02/26/2023 Skin care regimen initiated : 02/26/2023 Topical wound management initiated : 02/26/2023 Notes: Electronic Signature(s) Signed: 04/30/2023 4:57:48 PM By: Billy Deed RN, BSN Entered By: [name] on 04/30/2023 09:59:00 -------------------------------------------------------------------------------- Pain Assessment Details Patient Name: Date of Service: [name], [name] 04/30/2023 12:15 PM Medical  Record Number: [id number] Patient Account Number: [id number] Date of Birth/Sex: Treating RN: 02-17-1946 (77 y.o. [name] Primary Care Areli Jowett: Clinic, [name] Other Clinician: Referring Osiris Charles: Treating Sissy Goetzke/Extender: [name] [facility], [name] in Treatment: 9 Active Problems Location of Pain Severity and Description of Pain Patient Has Paino No Site Locations Rate the pain. Current Pain Level: 0 Pain Management and Medication Current Pain Management: Electronic Signature(s) Signed: 04/30/2023 4:57:48 PM By: Billy Deed RN, BSN Entered By: [name] on 04/30/2023 09:50:58 [name], [name] ([id number]) [id number] Page 5 of 7 -------------------------------------------------------------------------------- Patient/Caregiver Education Details Patient Name: Date of Service: [name], [name] 12/10/2024andnbsp12:15 PM Medical Record Number: [id number] Patient Account Number: [id number] Date of Birth/Gender: Treating RN: 08/17/1945 (77 y.o. [name] Primary Care Physician: Clinic, [name] Other Clinician: Referring Physician: Treating Physician/Extender: [name] Clinic, [name] in Treatment: 9 Education Assessment Education Provided To: Patient Education Topics Provided Elevated Blood Sugar/ Impact on Healing: Methods: Explain/Verbal Responses: Reinforcements needed, State content correctly Hyperbaric Oxygenation: Methods: Explain/Verbal Responses: Reinforcements needed, State content correctly Offloading: Methods: Explain/Verbal Responses: Reinforcements needed, State content correctly Wound/Skin Impairment: Methods: Explain/Verbal Responses: Reinforcements needed, State content correctly Electronic Signature(s) Signed: 04/30/2023 4:57:48 PM By: Billy Deed  RN, BSN Entered By: [name] on 04/30/2023  09:59:41 -------------------------------------------------------------------------------- Wound Assessment Details Patient Name: Date of Service: [name], [name] 04/30/2023 12:15 PM Medical Record Number: [id number] Patient Account Number: [id number] Date of Birth/Sex: Treating RN: 11-19-1945 (77 y.o. [name] Primary Care Paloma Grange: Clinic, [name] Other Clinician: Referring Adianna Darwin: Treating Roxanne Orner/Extender: [name] [facility], [name] in Treatment: 9 Wound Status Wound Number: 1 Primary Diabetic Wound/Ulcer of the Lower Extremity Etiology: Wound Location: Left, Posterior Calcaneus Wound Open Wounding Event: Blister Status: Date Acquired: 01/20/2023 Comorbid Arrhythmia, Coronary Artery Disease, Hypertension, Peripheral Weeks Of Treatment: 9 History: Venous Disease, Type II Diabetes, Osteoarthritis, Neuropathy Clustered Wound: No Photos [name], [name] ([id number]) [id number] Page 6 of 7 Wound Measurements Length: (cm) 0.9 Width: (cm) 0.4 Depth: (cm) 0.2 Area: (cm) 0.283 Volume: (cm) 0.057 % Reduction in Area: 83.3% % Reduction in Volume: 83.2% Epithelialization: Small (1-33%) Tunneling: No Undermining: No Wound Description Classification: Grade 3 Wound Margin: Distinct, outline attached Exudate Amount: Medium Exudate Type: Serosanguineous Exudate Color: red, brown Foul Odor After Cleansing: No Slough/Fibrino Yes Wound Bed Granulation Amount: Small (1-33%) Exposed Structure Granulation Quality: Red, Pink Fascia Exposed: No Necrotic Amount: Large (67-100%) Fat Layer (Subcutaneous Tissue) Exposed: Yes Necrotic Quality: Adherent Slough Tendon Exposed: No Muscle Exposed: No Joint Exposed: No Bone Exposed: No Periwound Skin Texture Texture Color No Abnormalities Noted: Yes No Abnormalities Noted: Yes Moisture Temperature / Pain No Abnormalities Noted: Yes Temperature: No Abnormality Tenderness on  Palpation: Yes Treatment Notes Wound #1 (Calcaneus) Wound Laterality: Left, Posterior Cleanser Wound Cleanser Discharge Instruction: Cleanse the wound with wound cleanser prior to applying Billy clean dressing using gauze sponges, not tissue or cotton balls. Peri-Wound Care Skin Prep Discharge Instruction: Use skin prep as directed Topical Primary Dressing Endoform 2x2 in Discharge Instruction: Moisten with saline Secondary Dressing Drawtex 4x4 in Discharge Instruction: Apply over primary dressing cut to fit inside wound Zetuvit Plus Silicone Border Dressing 3x3 (in/in) Discharge Instruction: Apply silicone border over primary dressing as directed. Secured With [employer] Compression Stockings Add-Ons [name], [name] ([id number]) [id number] Page 7 of 7 Electronic Signature(s) Signed: 04/30/2023 4:57:48 PM By: Billy Deed RN, BSN Entered By: [name] on 04/30/2023 09:57:58 -------------------------------------------------------------------------------- Vitals Details Patient Name: Date of Service: [name], [name] 04/30/2023 12:15 PM Medical Record Number: [id number] Patient Account Number: [id number] Date of Birth/Sex: Treating RN: 10-30-1945 (77 y.o. [name] Primary Care Jahzeel Poythress: Clinic, [name] Other Clinician: Referring Reymundo Winship: Treating Yuvan Medinger/Extender: [name] [facility], [name] in Treatment: 9 Vital Signs Time Taken: 12:48 Temperature (F): 97.9 Height (in): 70 Pulse (bpm): 72 Weight (lbs): 196 Respiratory Rate (breaths/min): 18 Body Mass Index (BMI): 28.1 Blood Pressure (mmHg): 177/69 Reference Range: 80 - 120 mg / dl Electronic Signature(s) Signed: 04/30/2023 4:57:48 PM By: Billy Deed RN, BSN Entered By: [name] on 04/30/2023 09:50:50

## 2023-05-02 ENCOUNTER — Encounter (HOSPITAL_BASED_OUTPATIENT_CLINIC_OR_DEPARTMENT_OTHER): Payer: No Typology Code available for payment source | Admitting: Internal Medicine

## 2023-05-02 DIAGNOSIS — L97524 Non-pressure chronic ulcer of other part of left foot with necrosis of bone: Secondary | ICD-10-CM | POA: Diagnosis not present

## 2023-05-02 LAB — GLUCOSE, CAPILLARY
Glucose-Capillary: 187 mg/dL — ABNORMAL HIGH (ref 70–99)
Glucose-Capillary: 132 mg/dL — ABNORMAL HIGH (ref 70–99)

## 2023-05-02 NOTE — Progress Notes (Signed)
[name], [name] ([id number]) [id number] Page 1 of 1 Visit Report for 05/01/2023 SuperBill Details Patient Name: Date of Service: [name], [name] 05/01/2023 Medical Record Number: [id number] Patient Account Number: [id number] Date of Birth/Sex: Treating RN: 1945/12/11 (77 y.o. 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 E11.51 Type 2 diabetes mellitus with diabetic peripheral angiopathy without gangrene Facility Procedures CPT4 Code Description Modifier Quantity [id number]-(Facility Use Only) HBOT full body chamber, , 4 ICD-10 Diagnosis Description E11.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

## 2023-05-03 NOTE — Progress Notes (Signed)
[name], [name] ([id number]) [id number] Page 1 of 2 Visit Report for 05/02/2023 Arrival Information Details Patient Name: Date of Service: [name], [name] 05/02/2023 12:00 PM Medical Record Number: [id number] Patient Account Number: [id number] Date of Birth/Sex: Treating RN: 1945-10-11 (77 y.o. [name] Primary Care Shela Esses: Clinic, [name] Other Clinician: [name] Referring Beda Dula: Treating Macyn Remmert/Extender: [name] Clinic, [place] Cerise in Treatment: 9 Visit Information History Since Last Visit All ordered tests and consults were completed: Yes Patient Arrived: [name] Added or deleted any medications: No Arrival Time: 11:50 Any new allergies or adverse reactions: No Accompanied By: spouse Had Billy fall or experienced change in No Transfer Assistance: None activities of daily living that may affect Patient Identification Verified: Yes risk of falls: Secondary Verification Process Completed: Yes Signs or symptoms of abuse/neglect since last visito No Patient Requires Transmission-Based Precautions: No Hospitalized since last visit: No Patient Has Alerts: No Implantable device outside of the clinic excluding No cellular tissue based products placed in the center since last visit: Pain Present Now: No Electronic Signature(s) Signed: 05/02/2023 5:11:36 PM By: [name] CHT EMT BS , , Previous Signature: 05/02/2023 4:47:36 PM Version By: [name] CHT EMT BS , , Entered By: [name] on 05/02/2023 17:11:36 -------------------------------------------------------------------------------- Encounter Discharge Information Details Patient Name: Date of Service: [name], [name] 05/02/2023 12:00 PM Medical Record Number: [id number] Patient Account Number: [id number] Date of Birth/Sex: Treating RN: 08/01/1945 (77 y.o. [name] Primary Care Nader Boys: Clinic, [place] Other  Clinician: [name] Referring Lindsie Simar: Treating Zachari Alberta/Extender: [name] Clinic, [place] Cerise in Treatment: 9 Encounter Discharge Information Items Discharge Condition: Stable Ambulatory Status: Walker Discharge Destination: Home Transportation: Private Auto Accompanied By: spouse Schedule Follow-up Appointment: No Clinical Summary of Care: Electronic Signature(s) Signed: 05/02/2023 5:11:52 PM By: [name] CHT EMT BS , , Previous Signature: 05/02/2023 4:55:46 PM Version By: [name] CHT EMT BS , , Entered By: [name] on 05/02/2023 17:11:52 [name] ([id number]) [id number] Page 2 of 2 -------------------------------------------------------------------------------- Vitals Details Patient Name: Date of Service: [name], [name] 05/02/2023 12:00 PM Medical Record Number: [id number] Patient Account Number: [id number] Date of Birth/Sex: Treating RN: 1946-05-04 (77 y.o. [name] Primary Care Tanajah Boulter: Clinic, [name] Other Clinician: [name] Referring Lucyle Alumbaugh: Treating Mora Pedraza/Extender: [name], [place] in Treatment: 9 Vital Signs Time Taken: 11:58 Temperature (F): 98.4 Height (in): 70 Pulse (bpm): 70 Weight (lbs): 196 Respiratory Rate (breaths/min): 18 Body Mass Index (BMI): 28.1 Blood Pressure (mmHg): 159/70 Capillary Blood Glucose (mg/dl): 062 Reference Range: 80 - 120 mg / dl Electronic Signature(s) Signed: 05/02/2023 4:48:02 PM By: [name] CHT EMT BS , , Entered By: [name] on 05/02/2023 16:48:02

## 2023-05-03 NOTE — Progress Notes (Addendum)
WANYE, ZHENG ([id number]) [id number] Page 1 of 3 Visit Report for 05/02/2023 HBO Details Patient Name: Date of Service: [name], [name] 05/02/2023 12:00 PM Medical Record Number: [id number] Patient Account Number: [id number] Date of Birth/Sex: Treating RN: 1946-03-24 (77 y.o. [name] Primary Care [name]: Clinic, [place] Other Clinician: Haywood [name] Referring Channell Quattrone: Treating Cledith Abdou/Extender: [name] [facility] [place] in Treatment: 9 HBO Treatment Course Details Treatment Course Number: 1 Ordering [name]: [name] T Treatments Ordered: otal 40 HBO Treatment Start Date: 03/11/2023 HBO Indication: Diabetic Ulcer(s) of the Lower Extremity Standard/Conservative Wound Care tried and failed greater than or equal to 30 days Wound #1 Left, Posterior Calcaneus , Wound #2 Right, Posterior Calcaneus HBO Treatment Details Treatment Number: 27 Patient Type: Outpatient Chamber Type: Monoplace Chamber Serial #: [id number] Treatment Protocol: 2.0 ATA with 90 minutes oxygen, and no air breaks Treatment Details Compression Rate Down: 1.5 psi / minute De-Compression Rate Up: 2.0 psi / minute Air breaks and breathing Decompress Decompress Compress Tx Pressure Begins Reached periods Begins Ends (leave unused spaces blank) Chamber Pressure (ATA 1 2 ------2 1 ) Clock Time (24 hr) 12:16 12:27 - - - - - - 13:57 14:09 Treatment Length: 113 (minutes) Treatment Segments: 4 Vital Signs Capillary Blood Glucose Reference Range: 80 - 120 mg / dl HBO Diabetic Blood Glucose Intervention Range: <131 mg/dl or >308 mg/dl Type: Time Vitals Blood Respiratory Capillary Blood Glucose Pulse Action Pulse: Temperature: Taken: Pressure: Rate: Glucose (mg/dl): Meter #: Oximetry (%) Taken: Pre 11:58 159/70 70 18 98.4 187 none per protocol Post 14:14 181/77 61 12 98.2 132 Post 14:19 202/82 manual BP Post 14:30 202/68 manual  BP Treatment Response Treatment Toleration: Well Treatment Completion Status: Treatment Completed without Adverse Event Treatment Notes [name] arrived with normal vital signs. He prepared for treatment. After performing a safety check, the patient was placed in the chamber which was compressed with 100% oxygen at the rate of 1.5 psi/min after confirming normal ear equalization. He tolerated the treatment and the subsequent decompression of the chamber at the rate of 2 psi/min. His post-treatment vital signs: systolic BP above 180 mmHg. Dr. Leanord [name] informed. Patient denied symptoms related to acute hypertensive crisis. He and his wife state that BP is normal at home and that he took his blood pressure medication today. He was stable upon discharge. Additional Procedure Documentation Tissue [name] [name] Notes No concerns with treatment given Billy, [name] ([id number]) [id number] Page 2 of 3 Physician HBO Attestation: I certify that I supervised this HBO treatment in accordance with Medicare guidelines. A trained emergency response team is readily available per Yes hospital policies and procedures. Continue HBOT as ordered. Yes Electronic Signature(s) Signed: 05/02/2023 5:17:16 PM By: Billy Najjar MD Previous Signature: 05/02/2023 4:54:41 PM Version By: [name] CHT EMT BS , , Entered By: [name] on 05/02/2023 17:15:34 -------------------------------------------------------------------------------- HBO Safety Checklist Details Patient Name: Date of Service: [name], [name] 05/02/2023 12:00 PM Medical Record Number: [id number] Patient Account Number: [id number] Date of Birth/Sex: Treating RN: [birth year] (77 y.o. [name] Primary Care [name]: [name] Other Clinician: Haywood [name] Referring [name]: Treating Zamyiah Tino/Extender: [name] [facility] [place] in Treatment:  9 HBO Safety Checklist Items Safety Checklist Consent Form Signed Patient voided / foley secured and emptied When did you last eato 0830 - 3 eggs, 2 sausage, 2 toast Last dose of injectable or oral agent 0810 Metformin Ostomy pouch emptied and vented if applicable  NA All implantable devices assessed, documented and approved NA Intravenous access site secured and place NA Valuables secured Linens and cotton and cotton/polyester blend (less than 51% polyester) Personal oil-based products / skin lotions / body lotions removed Wigs or hairpieces removed NA Smoking or tobacco materials removed NA Books / newspapers / magazines / loose paper removed Cologne, aftershave, perfume and deodorant removed Jewelry removed (may wrap wedding band) Make-up removed NA Hair care products removed Battery operated devices (external) removed Heating patches and chemical warmers removed Titanium eyewear removed Nail polish cured greater than 10 hours NA Casting material cured greater than 10 hours NA Hearing aids removed at home Loose dentures or partials removed dentures removed Prosthetics have been removed NA Patient demonstrates correct use of air break device (if applicable) Patient concerns have been addressed Patient grounding bracelet on and cord attached to chamber Specifics for Inpatients (complete in addition to above) Medication sheet sent with patient NA Intravenous medications needed or due during therapy sent with patient NA Drainage tubes (e.g. nasogastric tube or chest tube secured and vented) NA Endotracheal or Tracheotomy tube secured NA Cuff deflated of air and inflated with saline NA Airway suctioned NA Notes Paper version used prior to treatment start. CARA, PAYMENT ([id number]) [id number] Page 3 of 3 Electronic Signature(s) Signed: 05/02/2023 4:49:54 PM By: [name] CHT EMT BS , , Entered By: [name] on 05/02/2023  16:49:54

## 2023-05-03 NOTE — Progress Notes (Signed)
DOTSON, GRASER ([id number]) [id number] Page 1 of 1 Visit Report for 05/02/2023 SuperBill Details Patient Name: Date of Service: [name], [name]. 05/02/2023 Medical Record Number: [id number] Patient Account Number: [id number] Date of Birth/Sex: Treating RN: 1946-01-21 (77 y.o. 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 E11.51 Type 2 diabetes mellitus with diabetic peripheral angiopathy without gangrene Facility Procedures CPT4 Code Description Modifier Quantity [id number]-(Facility Use Only) HBOT full body chamber, , 4 ICD-10 Diagnosis Description 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

## 2023-05-06 ENCOUNTER — Encounter (HOSPITAL_BASED_OUTPATIENT_CLINIC_OR_DEPARTMENT_OTHER): Payer: No Typology Code available for payment source | Admitting: Internal Medicine

## 2023-05-07 ENCOUNTER — Encounter (HOSPITAL_BASED_OUTPATIENT_CLINIC_OR_DEPARTMENT_OTHER): Payer: No Typology Code available for payment source | Admitting: Internal Medicine

## 2023-05-07 DIAGNOSIS — L97524 Non-pressure chronic ulcer of other part of left foot with necrosis of bone: Secondary | ICD-10-CM | POA: Diagnosis not present

## 2023-05-08 ENCOUNTER — Encounter (HOSPITAL_BASED_OUTPATIENT_CLINIC_OR_DEPARTMENT_OTHER): Payer: No Typology Code available for payment source | Admitting: Internal Medicine

## 2023-05-09 ENCOUNTER — Encounter (HOSPITAL_BASED_OUTPATIENT_CLINIC_OR_DEPARTMENT_OTHER): Payer: No Typology Code available for payment source | Admitting: Internal Medicine

## 2023-05-09 NOTE — Progress Notes (Signed)
Billy, [name] ([id number]) [id number] Page 1 of 8 Visit Report for 05/07/2023 Arrival Information Details Patient Name: Date of Service: [name], [name] 05/07/2023 12:30 PM Medical Record Number: [id number] Patient Account Number: [id number] Date of Birth/Sex: Treating RN: June 02, 1945 (77 y.o. [name] Primary Care Weda Baumgarner: [name] Other Clinician: Referring Calem Cocozza: Treating Particia Strahm/Extender: [name] [place] in Treatment: 10 Visit Information History Since Last Visit Added or deleted any medications: No Patient Arrived: [name] Any new allergies or adverse reactions: No Arrival Time: 12:44 Had a fall or experienced change in No Accompanied By: son activities of daily living that may affect Transfer Assistance: None risk of falls: Patient Identification Verified: Yes Signs or symptoms of abuse/neglect since last visito No Secondary Verification Process Completed: Yes Hospitalized since last visit: No Patient Requires Transmission-Based Precautions: No Implantable device outside of the clinic excluding No Patient Has Alerts: No cellular tissue based products placed in the center since last visit: Has Dressing in Place as Prescribed: Yes Has Footwear/Offloading in Place as Prescribed: Yes Left: Wedge Shoe Pain Present Now: Yes Electronic Signature(s) Signed: 05/08/2023 5:35:56 PM By: Shawn Stall RN, BSN Entered By: Shawn Stall on 05/07/2023 12:45:14 -------------------------------------------------------------------------------- Encounter Discharge Information Details Patient Name: Date of Service: [name], [name] 05/07/2023 12:30 PM Medical Record Number: [id number] Patient Account Number: [id number] Date of Birth/Sex: Treating RN: 03/19/1946 (77 y.o. [name] Primary Care Emeric Novinger: [name] Other Clinician: Referring Madalen Gavin: Treating Digby Groeneveld/Extender: Kathaleen Grinder in Treatment: 10 Encounter Discharge Information Items Post Procedure Vitals Discharge Condition: Stable Temperature (F): 98.2 Ambulatory Status: Walker Pulse (bpm): 65 Discharge Destination: Home Respiratory Rate (breaths/min): 20 Transportation: Private Auto Blood Pressure (mmHg): 154/72 Accompanied By: son Schedule Follow-up Appointment: Yes Clinical Summary of Care: Electronic Signature(s) Signed: 05/08/2023 5:35:56 PM By: Shawn Stall RN, BSN Entered By: Shawn Stall on 05/07/2023 13:03:44 [name] ([id number]) [id number] Page 2 of 8 -------------------------------------------------------------------------------- Lower Extremity Assessment Details Patient Name: Date of Service: [name], [name] 05/07/2023 12:30 PM Medical Record Number: [id number] Patient Account Number: [id number] Date of Birth/Sex: Treating RN: March 22, 1946 (77 y.o. [name] Primary Care Mariko Nowakowski: [name] Other Clinician: Referring Quinterious Walraven: Treating Tomie Elko/Extender: [name] [place] in Treatment: 10 Edema Assessment Assessed: [Left: Yes] [Right: No] Edema: [Left: Ye] [Right: s] Calf Left: Right: Point of Measurement: From Medial Instep 31 cm Ankle Left: Right: Point of Measurement: From Medial Instep 21 cm Vascular Assessment Pulses: Dorsalis Pedis Palpable: [Left:Yes] Extremity colors, hair growth, and conditions: Extremity Color: [Left:Normal] Hair Growth on Extremity: [Left:No] Temperature of Extremity: [Left:Warm] Capillary Refill: [Left:< 3 seconds] Dependent Rubor: [Left:No] Blanched when Elevated: [Left:No No] Toe Nail Assessment Left: Right: Thick: No Discolored: No Deformed: No Improper Length and Hygiene: No Electronic Signature(s) Signed: 05/08/2023 5:35:56 PM By: Shawn Stall RN, BSN Entered By: Shawn Stall on 05/07/2023  12:47:53 -------------------------------------------------------------------------------- Multi Wound Chart Details Patient Name: Date of Service: [name], [name] 05/07/2023 12:30 PM Medical Record Number: [id number] Patient Account Number: [id number] Date of Birth/Sex: Treating RN: 06-Feb-1946 (77 y.o. M) Primary Care Justis Closser: [name] Other Clinician: Referring Ellamay Fors: Treating Mckenize Mezera/Extender: Kathaleen Grinder in Treatment: 10 Vital Signs Height(in): 70 Pulse(bpm): 65 Weight(lbs): 196 Blood Pressure(mmHg): 154/72 Body Mass Index(BMI): 28.1 Temperature(F): 98.2 DAYCEON, RATKOVICH ([id number]) [id number] Page 3 of 8 Respiratory Rate(breaths/min): 20 [1:Photos:] [N/A:N/A] Left, Posterior Calcaneus N/A N/A Wound Location: Blister N/A N/A Wounding Event: Diabetic Wound/Ulcer  of the Lower N/A N/A Primary Etiology: Extremity Arrhythmia, Coronary Artery Disease, N/A N/A Comorbid History: Hypertension, Peripheral Venous Disease, Type II Diabetes, Osteoarthritis, Neuropathy 01/20/2023 N/A N/A Date Acquired: 10 N/A N/A Weeks of Treatment: Open N/A N/A Wound Status: No N/A N/A Wound Recurrence: 0.9x0.5x0.3 N/A N/A Measurements L x W x D (cm) 0.353 N/A N/A A (cm) : rea 0.106 N/A N/A Volume (cm) : 79.20% N/A N/A % Reduction in A rea: 68.70% N/A N/A % Reduction in Volume: Grade 3 N/A N/A Classification: Medium N/A N/A Exudate A mount: Serosanguineous N/A N/A Exudate Type: red, brown N/A N/A Exudate Color: Distinct, outline attached N/A N/A Wound Margin: Large (67-100%) N/A N/A Granulation A mount: Red, Pink N/A N/A Granulation Quality: Small (1-33%) N/A N/A Necrotic A mount: Fat Layer (Subcutaneous Tissue): Yes N/A N/A Exposed Structures: Fascia: No Tendon: No Muscle: No Joint: No Bone: No Small (1-33%) N/A N/A Epithelialization: Debridement - Selective/Open Wound N/A  N/A Debridement: Pre-procedure Verification/Time Out 12:55 N/A N/A Taken: Lidocaine 4% Topical Solution N/A N/A Pain Control: Slough N/A N/A Tissue Debrided: Non-Viable Tissue N/A N/A Level: 0.35 N/A N/A Debridement A (sq cm): rea Curette N/A N/A Instrument: Minimum N/A N/A Bleeding: Pressure N/A N/A Hemostasis A chieved: 0 N/A N/A Procedural Pain: 0 N/A N/A Post Procedural Pain: Procedure was tolerated well N/A N/A Debridement Treatment Response: 0.9x0.5x0.3 N/A N/A Post Debridement Measurements L x W x D (cm) 0.106 N/A N/A Post Debridement Volume: (cm) No Abnormalities Noted N/A N/A Periwound Skin Texture: Maceration: No N/A N/A Periwound Skin Moisture: Dry/Scaly: No No Abnormalities Noted N/A N/A Periwound Skin Color: No Abnormality N/A N/A Temperature: Yes N/A N/A Tenderness on Palpation: Debridement N/A N/A Procedures Performed: Treatment Notes Wound #1 (Calcaneus) Wound Laterality: Left, Posterior Cleanser Wound Cleanser Discharge Instruction: Cleanse the wound with wound cleanser prior to applying a clean dressing using gauze sponges, not tissue or cotton balls. Peri-Wound Care Skin Prep Discharge Instruction: Use skin prep as directed LAVONTE, LU ([id number]) [id number] Page 4 of 8 Topical Primary Dressing Endoform 2x2 in Discharge Instruction: Moisten with saline Secondary Dressing Drawtex 4x4 in Discharge Instruction: Apply over primary dressing cut to fit inside wound Zetuvit Plus Silicone Border Dressing 3x3 (in/in) Discharge Instruction: Apply silicone border over primary dressing as directed. Secured With Compression Wrap Compression Stockings Facilities manager) Signed: 05/07/2023 4:32:13 PM By: [name] MD Entered By: [name] on 05/07/2023 13:07:24 -------------------------------------------------------------------------------- Multi-Disciplinary Care Plan Details Patient  Name: Date of Service: [name], [name] 05/07/2023 12:30 PM Medical Record Number: [id number] Patient Account Number: [id number] Date of Birth/Sex: Treating RN: 29-Mar-1946 (77 y.o. [name] Primary Care Lauriel Helin: [name] Other Clinician: Referring Toinette Lackie: Treating Darienne Belleau/Extender: Kathaleen Grinder in Treatment: 10 Multidisciplinary Care Plan reviewed with physician Active Inactive Wound/Skin Impairment Nursing Diagnoses: Knowledge deficit related to smoking impact on wound healing Goals: Patient/caregiver will verbalize understanding of skin care regimen Date Initiated: 02/26/2023 Target Resolution Date: 06/21/2023 Goal Status: Active Interventions: Assess patient/caregiver ability to obtain necessary supplies Assess patient/caregiver ability to perform ulcer/skin care regimen upon admission and as needed Assess ulceration(s) every visit Provide education on ulcer and skin care Screen for HBO Treatment Activities: Consult for HBO : 02/26/2023 Skin care regimen initiated : 02/26/2023 Topical wound management initiated : 02/26/2023 Notes: Electronic Signature(s) Signed: 05/08/2023 5:35:56 PM By: Shawn Stall RN, BSN Entered By: Shawn Stall on 05/07/2023 12:51:39 [name] ([id number]) [id number] Page 5 of 8 -------------------------------------------------------------------------------- Pain Assessment Details Patient Name:  Date of Service: [name], [name] 05/07/2023 12:30 PM Medical Record Number: [id number] Patient Account Number: [id number] Date of Birth/Sex: Treating RN: 21-Aug-1945 (77 y.o. [name] Primary Care Rhylen Shaheen: [name] Other Clinician: Referring Christ Fullenwider: Treating Jeramia Saleeby/Extender: Kathaleen Grinder in Treatment: 10 Active Problems Location of Pain Severity and Description of Pain Patient Has Paino Yes Site Locations Pain  Location: Pain in Ulcers Duration of the Pain. Constant / Intermittento Intermittent Pain Management and Medication Current Pain Management: Medication: No Cold Application: No Rest: No Massage: No Activity: No T.E.N.S.: No Heat Application: No Leg drop or elevation: No Is the Current Pain Management Adequate: Adequate How does your wound impact your activities of daily livingo Sleep: No Bathing: No Appetite: No Relationship With Others: No Bladder Continence: No Emotions: No Bowel Continence: No Work: No Toileting: No Drive: No Dressing: No Hobbies: No Notes per patient left heel hurts when waking up in the morning and at times walking. Electronic Signature(s) Signed: 05/08/2023 5:35:56 PM By: Shawn Stall RN, BSN Entered By: Shawn Stall on 05/07/2023 12:46:11 -------------------------------------------------------------------------------- Patient/Caregiver Education Details Patient Name: Date of Service: [name], [name] 12/17/2024andnbsp12:30 PM [name] ([id number]) [id number] Page 6 of 8 Medical Record Number: [id number] Patient Account Number: [id number] Date of Birth/Gender: Treating RN: 09/06/1945 (77 y.o. [name] Primary Care Physician: [name] Other Clinician: Referring Physician: Treating Physician/Extender: [name] [facility] [place] in Treatment: 10 Education Assessment Education Provided To: Patient Education Topics Provided Wound/Skin Impairment: Handouts: Caring for Your Ulcer Methods: Explain/Verbal Responses: Reinforcements needed Electronic Signature(s) Signed: 05/08/2023 5:35:56 PM By: Shawn Stall RN, BSN Entered By: Shawn Stall on 05/07/2023 12:51:56 -------------------------------------------------------------------------------- Wound Assessment Details Patient Name: Date of Service: [name], [name] 05/07/2023 12:30 PM Medical Record Number:  [id number] Patient Account Number: [id number] Date of Birth/Sex: Treating RN: 1945/09/08 (77 y.o. [name] Primary Care Rini Moffit: [name] Other Clinician: Referring Ahron Hulbert: Treating Latitia Housewright/Extender: [name] [place] in Treatment: 10 Wound Status Wound Number: 1 Primary Diabetic Wound/Ulcer of the Lower Extremity Etiology: Wound Location: Left, Posterior Calcaneus Wound Open Wounding Event: Blister Status: Date Acquired: 01/20/2023 Comorbid Arrhythmia, Coronary Artery Disease, Hypertension, Peripheral Weeks Of Treatment: 10 History: Venous Disease, Type II Diabetes, Osteoarthritis, Neuropathy Clustered Wound: No Photos Wound Measurements Length: (cm) 0.9 Width: (cm) 0.5 Depth: (cm) 0.3 Area: (cm) 0.353 Volume: (cm) 0.106 % Reduction in Area: 79.2% % Reduction in Volume: 68.7% Epithelialization: Small (1-33%) Tunneling: No Undermining: No Wound Description Classification: Grade 3 Wound Margin: Distinct, outline attached Exudate Amount: Medium NAVY, MOEBIUS S ([id number]) Exudate Type: Serosanguineous Exudate Color: red, brown Foul Odor After Cleansing: No Slough/Fibrino Yes [id number] Page 7 of 8 Wound Bed Granulation Amount: Large (67-100%) Exposed Structure Granulation Quality: Red, Pink Fascia Exposed: No Necrotic Amount: Small (1-33%) Fat Layer (Subcutaneous Tissue) Exposed: Yes Necrotic Quality: Adherent Slough Tendon Exposed: No Muscle Exposed: No Joint Exposed: No Bone Exposed: No Periwound Skin Texture Texture Color No Abnormalities Noted: Yes No Abnormalities Noted: Yes Moisture Temperature / Pain No Abnormalities Noted: Yes Temperature: No Abnormality Tenderness on Palpation: Yes Treatment Notes Wound #1 (Calcaneus) Wound Laterality: Left, Posterior Cleanser Wound Cleanser Discharge Instruction: Cleanse the wound with wound cleanser prior to applying a clean dressing  using gauze sponges, not tissue or cotton balls. Peri-Wound Care Skin Prep Discharge Instruction: Use skin prep as directed Topical Primary Dressing Endoform 2x2 in Discharge Instruction: Moisten with saline Secondary Dressing Drawtex 4x4 in Discharge Instruction:  Apply over primary dressing cut to fit inside wound Zetuvit Plus Silicone Border Dressing 3x3 (in/in) Discharge Instruction: Apply silicone border over primary dressing as directed. Secured With Compression Wrap Compression Stockings Facilities manager) Signed: 05/08/2023 5:35:56 PM By: Shawn Stall RN, BSN Entered By: Shawn Stall on 05/07/2023 12:52:37 -------------------------------------------------------------------------------- Vitals Details Patient Name: Date of Service: [name], [name] 05/07/2023 12:30 PM Medical Record Number: [id number] Patient Account Number: [id number] Date of Birth/Sex: Treating RN: 10/28/1945 (77 y.o. [name] Primary Care Edouard Gikas: [name] Other Clinician: Referring Eilleen Davoli: Treating Harrol Novello/Extender: Kathaleen Grinder in Treatment: 10 Vital Signs Time Taken: 12:45 Temperature (F): 98.2 NIKOLAI, PARADEE ([id number]) [id number] Page 8 of 8 Height (in): 70 Pulse (bpm): 65 Weight (lbs): 196 Respiratory Rate (breaths/min): 20 Body Mass Index (BMI): 28.1 Blood Pressure (mmHg): 154/72 Reference Range: 80 - 120 mg / dl Electronic Signature(s) Signed: 05/08/2023 5:35:56 PM By: Shawn Stall RN, BSN Entered By: Shawn Stall on 05/07/2023 12:45:37

## 2023-05-10 ENCOUNTER — Encounter (HOSPITAL_BASED_OUTPATIENT_CLINIC_OR_DEPARTMENT_OTHER): Payer: No Typology Code available for payment source | Admitting: General Surgery

## 2023-05-10 NOTE — Progress Notes (Signed)
AEDEN, HASSINGER ([id number]) [id number] Page 1 of 8 Visit Report for 05/07/2023 Debridement Details Patient Name: Date of Service: [name], [name] 05/07/2023 12:30 PM Medical Record Number: [id number] Patient Account Number: [id number] Date of Birth/Sex: Treating RN: August 13, 1945 (77 y.o. M) Primary Care Provider: [name] Other Clinician: Referring Provider: Treating Provider/Extender: [name] [facility], [place] in Treatment: 10 Debridement Performed for Assessment: Wound #1 Left,Posterior Calcaneus Performed By: Physician Maxwell Caul., MD The following information was scribed by: Shawn Stall The information was scribed for: [name] Debridement Type: Debridement Severity of Tissue Pre Debridement: Fat layer exposed Level of Consciousness (Pre-procedure): Awake and Alert Pre-procedure Verification/Time Out Yes - 12:55 Taken: Start Time: 12:56 Pain Control: Lidocaine 4% T opical Solution Percent of Wound Bed Debrided: 100% T Area Debrided (cm): otal 0.35 Tissue and other material debrided: Non-Viable, Slough, Slough Level: Non-Viable Tissue Debridement Description: Selective/Open Wound Instrument: Curette Bleeding: Minimum Hemostasis Achieved: Pressure End Time: 13:01 Procedural Pain: 0 Post Procedural Pain: 0 Response to Treatment: Procedure was tolerated well Level of Consciousness (Post- Awake and Alert procedure): Post Debridement Measurements of Total Wound Length: (cm) 0.9 Width: (cm) 0.5 Depth: (cm) 0.3 Volume: (cm) 0.106 Character of Wound/Ulcer Post Debridement: Improved Severity of Tissue Post Debridement: Fat layer exposed Post Procedure Diagnosis Same as Pre-procedure Electronic Signature(s) Signed: 05/07/2023 4:32:13 PM By: [name] MD Entered By: [name] on 05/07/2023 13:07:33 -------------------------------------------------------------------------------- HPI  Details Patient Name: Date of Service: [name], [name] 05/07/2023 12:30 PM Medical Record Number: [id number] Patient Account Number: [id number] Date of Birth/Sex: Treating RN: February 14, 1946 (77 y.o. M) Primary Care Provider: [name] Other Clinician: Referring Provider: Treating Provider/Extender: Kathaleen Grinder in Treatment: [address] [place] ([id number]) [id number] Page 2 of 8 History of Present Illness HPI Description: ADMISSION 02/26/2023 Patient is Billy 77 year old man with type 2 diabetes. He has had Billy 44-month history of wounds on his bilateral heels just above the heel tips. They have been using Iodoflex/Iodosorb ointment. The patient has Billy history of PAD and has Billy SFA stent on the right. He also also status post left iliac stent. Furthermore he has Billy history of carotid artery stenosis status post right carotid endarterectomy and Billy left subclavian artery stenosis. His wound started on his blisters on the heel that of open to wounds with minimal but some depth right greater than left. He also has advanced diabetic neuropathy. The patient is cared for through the [place] and has been referred here for hyperbaric oxygen treatment through his vascular surgeon. Past medical history includes hypertension, hyperlipidemia, type 2 diabetes chronic kidney disease stage IIIb, carotid artery stenosis status post right carotid endarterectomy, left subclavian artery stenosis ABIs done in our clinic were 1.11 on the right and 0.75 on the left these were remarkably similar to what was quoted during the vascular surgery appointment from 02/05/2023. 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 He was referred for hyperbaric oxygen by the [place]. His chest x-ray I think shows vascular crowding but no pneumothorax. He has been using Iodoflex on the wound with reasonably good improvement this  week. He sees his ENT in 2 days and then they will make Billy decision about when they want to start HBO 10/24; patient is started HBO I believe he is on treatment 5 today. He had some problems with his right ear on the second dive however he has had  no problems since and says he feels well. He slept through treatment yesterday. He has been using Iodosorb ointment on the wounds on both heels for Billy long period even before he came here. Unfortunately the wound surface still does not look completely as viable as I would like. 11/5; he continues to tolerate HBO fairly well although he is having some fullness in his left ear that is reversed by popping his left ear. He has been using Sorbact hydrogel. He comes in today with the wounds quite Billy bit smaller especially on the right but also on the left. He is fairly rigorous and offloading these with heel offloading shoes. 11/11; right posterior calcaneus is closed down. The area on the left calcaneus laterally still has some depth. We have been using Sorbact on both sides. He has completed 12 out of 40 treatments 11/19; right posterior calcaneus is still closed the area on the left is quite Billy bit smaller. We have been using Sorbact on the left heel. He is still receiving HBO which was requested by the [place]. These wounds may be healed soon 12/3; the only remaining wound is the left posterior calcaneus and it is smaller. We've been using sore act. He is continuing HBO. On presentation he had Billy similar wound on the right posterior heel which is healed two or three weeks ago. 12/10; left posterior lateral calcaneus. Unfortunately no improvement today. Using Sorbact and hydrogel her intake nurses saying that the Sorbact was not adherent to the wound. He is continuing in HBO 12/17; left posterior lateral calcaneus. Small punched-out area remains. Unfortunately cannot continue HBO for the foreseeable future his wife had Billy fall with multiple rib fractures and had to be  admitted the hospital. We are using endoform to this wound. The right heel heel sometime ago The patient is complaining of some pain in the left foot. No predictable claudication however. He does have Billy history of PAD and has Billy left iliac artery stent. ABI in our clinic was 0.75 Electronic Signature(s) Signed: 05/07/2023 4:32:13 PM By: [name] MD Entered By: [name] on 05/07/2023 13:08:47 -------------------------------------------------------------------------------- Physical Exam Details Patient Name: Date of Service: [name], [name] 05/07/2023 12:30 PM Medical Record Number: [id number] Patient Account Number: [id number] Date of Birth/Sex: Treating RN: 04-Nov-1945 (77 y.o. M) Primary Care Provider: [name] Other Clinician: Referring Provider: Treating Provider/Extender: Kathaleen Grinder in Treatment: 10 Constitutional Patient is hypertensive.. Pulse regular and within target range for patient.Marland Kitchen Respirations regular, non-labored and within target range.. Temperature is normal and within the target range for the patient.Marland Kitchen Appears in no distress. Notes Wound exam; small wound on the posterior lateral left heel. This is not Billy weightbearing surface. Granulation had some slough on it I used Billy #3 curette to remove this. Otherwise looks healthy. This does not probe deeply. No evidence of surrounding infection. His pedal pulses at the dorsalis pedis are palpable but reduced I had trouble filling the posterior tibial however Electronic Signature(s) Signed: 05/07/2023 4:32:13 PM By: [name] MD Starace, Signed: 05/07/2023 4:32:13 PM By: [name] MD Les Pou ([id number]) [id number] Page 3 of 8 Entered By: [name] on 05/07/2023 13:09:53 -------------------------------------------------------------------------------- Physician Orders Details Patient Name: Date of Service: [name], Billy BERT  S. 05/07/2023 12:30 PM Medical Record Number: [id number] Patient Account Number: [id number] Date of Birth/Sex: Treating RN: 1945/09/02 (77 y.o. 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 - DR. Cannon covering for Dr. Sharlot Gowda. Mikey Bussing Return Billy 12/31 @ 12:30 pm Other: - Once wounds close will discontinue hyperbarics. Wear shoe to right foot for protection and keep cover for protection 1-2 weeks. Anesthetic (In clinic) Topical Lidocaine 4% applied to wound bed - In clinic Bathing/ Shower/ Hygiene May shower and wash wound with soap and water. - with dressing changes Off-Loading Wound #1 Left,Posterior Calcaneus Wedge shoe to: - Off loading shoes bilaterally to alleviate pressure from heels. Turn and reposition every 2 hours Prevalon Boot - bilaterally at bedtime Hyperbaric Oxygen Therapy Wound #1 Left,Posterior Calcaneus Evaluate for HBO Therapy - Per patient, he has claustrophobia. Evaluate for HBO Therapy - Per patient, he has claustrophobia. If appropriate for treatment, begin HBOT per protocol: 2.0 ATA for 90 Minutes without Billy Breaks ir 2.0 ATA for 90 Minutes without Billy Breaks ir Total Number of Treatments: - 40 One treatments per day (delivered Monday through Friday unless otherwise specified in Special Instructions below): One  treatments per day (delivered Monday through Friday unless otherwise specified in Special Instructions below): Finger stick Blood Glucose Pre- and Post- HBOT Treatment. Finger stick Blood Glucose Pre- and Post- HBOT Treatment. Follow Hyperbaric Oxygen Glycemia Protocol Follow Hyperbaric Oxygen Glycemia Protocol Give two 4oz orange juices in addition to Glucerna when the glycemic protocol is used. Give two 4oz orange juices in addition to Glucerna when the glycemic protocol is used. Billy frin (Oxymetazoline HCL) 0.05% nasal spray - 1 spray in both nostrils daily as needed prior to HBO treatment for difficulty clearing ears Wound Treatment Wound #1 - Calcaneus Wound Laterality: Left, Posterior Cleanser: Wound Cleanser Every Other Day/30 Days Discharge Instructions: Cleanse the wound with wound cleanser prior to applying Billy clean dressing using gauze sponges, not tissue or cotton balls. Peri-Wound Care: Skin Prep Every Other Day/30 Days Discharge Instructions: Use skin prep as directed Prim Dressing: Endoform 2x2 in Every Other Day/30 Days ary Discharge Instructions: Moisten with saline YONI, LAUTURE ([id number]) [phone].pdf Page 4 of 8 Secondary Dressing: Drawtex 4x4 in Every Other Day/30 Days Discharge Instructions: Apply over primary dressing cut to fit inside wound Secondary Dressing: Zetuvit Plus Silicone Border Dressing 3x3 (in/in) Every Other Day/30 Days Discharge Instructions: Apply silicone border over primary dressing as directed. GLYCEMIA INTERVENTIONS PROTOCOL PRE-HBO GLYCEMIA INTERVENTIONS ACTION INTERVENTION Obtain pre-HBO capillary blood glucose (ensure 1 physician order is in chart). Billy. Notify HBO physician and await physician orders. 2 If result is 70 mg/dl or below: B. If the result meets the hospital definition of Billy critical result, follow hospital policy. Billy. Give patient an 8 ounce Glucerna Shake, an 8 ounce Ensure, or 8 ounces of  Billy Glucerna/Ensure equivalent dietary supplement*. B. Wait 30 minutes. If result is 71 mg/dl to 595 mg/dl: C. Retest patients capillary blood glucose (CBG). D. If result greater than or equal to 110 mg/dl, proceed with HBO. If result less than 110 mg/dl, notify HBO physician and consider holding HBO. If result is 131 mg/dl to 638 mg/dl: Billy. Proceed with HBO. Billy. Notify HBO physician and await  physician orders. B. It is recommended to hold HBO and do If result is 250 mg/dl or greater: blood/urine ketone testing. C. If the result meets the hospital definition of Billy critical result, follow hospital policy. POST-HBO GLYCEMIA INTERVENTIONS ACTION INTERVENTION Obtain post HBO capillary blood glucose (ensure 1 physician order is in chart). Billy. Notify HBO physician and await physician orders. 2 If result is 70 mg/dl or below: B. If the result meets the hospital definition of Billy critical result, follow hospital policy. Billy. Give patient an 8 ounce Glucerna Shake, an 8 ounce Ensure, or 8 ounces of Billy Glucerna/Ensure equivalent dietary supplement*. B. Wait 15 minutes for symptoms of If result is 71 mg/dl to 960 mg/dl: hypoglycemia (i.e. nervousness, anxiety, sweating, chills, clamminess, irritability, confusion, tachycardia or dizziness). C. If patient asymptomatic, discharge patient. If patient symptomatic, repeat capillary blood glucose (CBG) and notify HBO physician. If result is 101 mg/dl to 454 mg/dl: Billy. Discharge patient. Billy. Notify HBO physician and await physician orders. B. It is recommended to do blood/urine ketone If result is 250 mg/dl or greater: testing. C. If the result meets the hospital definition of Billy critical result, follow hospital policy. *Juice or candies are NOT equivalent products. If patient refuses the Glucerna or Ensure, please consult the hospital dietitian for an appropriate substitute. Electronic Signature(s) Signed: 05/07/2023 4:32:13 PM By: [name]  MD Signed: 05/08/2023 5:35:56 PM By: Shawn Stall RN, BSN Entered By: Shawn Stall on 05/07/2023 13:02:35 -------------------------------------------------------------------------------- Problem List Details Patient Name: Date of Service: [name], [name] 05/07/2023 12:30 PM Medical Record Number: [id number] Patient Account Number: [id number] Date of Birth/Sex: Treating RN: [birth year] (77 y.o. [name], [name] ([id number]) [id number] Page 5 of 8 Primary Care Provider: [facility], [place] Other Clinician: Referring Provider: Treating Provider/Extender: [name] [facility], [place] in Treatment: 10 Active Problems ICD-10 Encounter Code Description Active Date MDM Diagnosis E11.51 Type 2 diabetes mellitus with diabetic peripheral angiopathy without gangrene 02/26/2023 No Yes E11.621 Type 2 diabetes mellitus with foot ulcer 02/26/2023 No Yes L97.521 Non-pressure chronic ulcer of other part of left foot limited to breakdown of 05/07/2023 No Yes skin L97.524 Non-pressure chronic ulcer of other part of left foot with necrosis of bone 05/07/2023 No Yes E11.42 Type 2 diabetes mellitus with diabetic polyneuropathy 02/26/2023 No Yes Inactive Problems ICD-10 Code Description Active Date Inactive Date 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 02/26/2023 02/26/2023 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 05/07/2023 12:30 PM Medical Record Number: [id number] Patient Account Number: [id number] Date of Birth/Sex: Treating  RN: [birth year] (77 y.o. M) Primary Care Provider: [name] Other Clinician: Referring Provider: Treating Provider/Extender: [name] [place] in Treatment: 10 Subjective History of Present Illness (HPI) ADMISSION 02/26/2023 Patient is Billy 77 year old man with type 2 diabetes. He has had Billy 69-month history of wounds on his bilateral heels just above the heel tips. They have been using Iodoflex/Iodosorb ointment. The patient has Billy history of PAD and has Billy SFA stent on the right. He also also status post left iliac stent. Furthermore he has Billy history of carotid artery stenosis status post right carotid endarterectomy and Billy left subclavian artery stenosis. His wound started on his blisters on  the heel that of open to wounds with minimal but some depth right greater than left. He also has advanced diabetic neuropathy. The patient is cared for through the [facility] ([id number]) [phone].pdf Page 6 of 8 [facility] and has been referred here for hyperbaric oxygen treatment through his vascular surgeon. Past medical history includes hypertension, hyperlipidemia, type 2 diabetes chronic kidney disease stage IIIb, carotid artery stenosis status post right carotid endarterectomy, left subclavian artery stenosis ABIs done in our clinic were 1.11 on the right and 0.75 on the left these were remarkably similar to what was quoted during the vascular surgery appointment from 02/05/2023. 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 He was referred for hyperbaric oxygen by the [place]. His chest x-ray I think shows vascular crowding but no pneumothorax. He has been using Iodoflex on the wound with reasonably good improvement this week. He sees his ENT in 2 days and then they will make Billy decision about when they want to start HBO 10/24; patient is started HBO I believe he is on treatment 5 today. He had some  problems with his right ear on the second dive however he has had no problems since and says he feels well. He slept through treatment yesterday. He has been using Iodosorb ointment on the wounds on both heels for Billy long period even before he came here. Unfortunately the wound surface still does not look completely as viable as I would like. 11/5; he continues to tolerate HBO fairly well although he is having some fullness in his left ear that is reversed by popping his left ear. He has been using Sorbact hydrogel. He comes in today with the wounds quite Billy bit smaller especially on the right but also on the left. He is fairly rigorous and offloading these with heel offloading shoes. 11/11; right posterior calcaneus is closed down. The area on the left calcaneus laterally still has some depth. We have been using Sorbact on both sides. He has completed 12 out of 40 treatments 11/19; right posterior calcaneus is still closed the area on the left is quite Billy bit smaller. We have been using Sorbact on the left heel. He is still receiving HBO which was requested by the [place]. These wounds may be healed soon 12/3; the only remaining wound is the left posterior calcaneus and it is smaller. We've been using sore act. He is continuing HBO. On presentation he had Billy similar wound on the right posterior heel which is healed two or three weeks ago. 12/10; left posterior lateral calcaneus. Unfortunately no improvement today. Using Sorbact and hydrogel her intake nurses saying that the Sorbact was not adherent to the wound. He is continuing in HBO 12/17; left posterior lateral calcaneus. Small punched-out area remains. Unfortunately cannot continue HBO for the foreseeable future his wife had Billy fall with multiple rib fractures and had to be admitted the hospital. We are using endoform to this wound. The right heel heel sometime ago The patient is complaining of some pain in the left foot. No predictable claudication  however. He does have Billy history of PAD and has Billy left iliac artery stent. ABI in our clinic was 0.75 Objective Constitutional Patient is hypertensive.. Pulse regular and within target range for patient.Marland Kitchen Respirations regular, non-labored and within target range.. Temperature is normal and within the target range for the patient.Marland Kitchen Appears in no distress. Vitals Time Taken: 12:45 PM, Height: 70 in,  Weight: 196 lbs, BMI: 28.1, Temperature: 98.2 F, Pulse: 65 bpm, Respiratory Rate: 20 breaths/min, Blood Pressure: 154/72 mmHg. General Notes: Wound exam; small wound on the posterior lateral left heel. This is not Billy weightbearing surface. Granulation had some slough on it I used Billy #3 curette to remove this. Otherwise looks healthy. This does not probe deeply. No evidence of surrounding infection. His pedal pulses at the dorsalis pedis are palpable but reduced I had trouble filling the posterior tibial however Integumentary (Hair, Skin) Wound #1 status is Open. Original cause of wound was Blister. The date acquired was: 01/20/2023. The wound has been in treatment 10 weeks. The wound is located on the Left,Posterior Calcaneus. The wound measures 0.9cm length x 0.5cm width x 0.3cm depth; 0.353cm^2 area and 0.106cm^3 volume. There is Fat Layer (Subcutaneous Tissue) exposed. There is no tunneling or undermining noted. There is Billy medium amount of serosanguineous drainage noted. The wound margin is distinct with the outline attached to the wound base. There is large (67-100%) red, pink granulation within the wound bed. There is Billy small (1-33%) amount of necrotic tissue within the wound bed including Adherent Slough. The periwound skin appearance had no abnormalities noted for texture. The periwound skin appearance had no abnormalities noted for moisture. The periwound skin appearance had no abnormalities noted for color. Periwound temperature was noted as No Abnormality. The periwound has tenderness on  palpation. 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 There was Billy Selective/Open Wound Non-Viable Tissue Debridement with Billy total area of 0.35 sq cm performed by Maxwell Caul., MD. With the following instrument(s): Curette to remove Non-Viable tissue/material. Material removed includes [facility] after achieving pain control using Lidocaine 4% T opical Solution. Billy time out was conducted at 12:55, prior to the start of the procedure. Billy Minimum amount of bleeding was controlled with Pressure. The procedure was tolerated well with Billy pain level of 0 throughout and Billy pain level of 0 following the procedure. Post Debridement Measurements: 0.9cm length x 0.5cm width x 0.3cm depth; 0.106cm^3 volume. Character of Wound/Ulcer Post Debridement is improved. Severity of Tissue Post Debridement is: Fat layer exposed. Post procedure Diagnosis Wound #1: Same as Pre-Procedure Plan Follow-up Appointments: Return Appointment in 1 week. - DR. Cannon covering for Dr. Sharlot Gowda. Mikey Bussing 12/31 @ 12:30 pm Other: - Once wounds close will discontinue hyperbarics. Wear shoe to right foot for protection and keep cover for protection 1-2 weeks. Anesthetic: (In clinic) Topical Lidocaine 4% applied to wound bed - In clinic Bathing/ Shower/ Hygiene: May shower and wash wound with soap and water. - with dressing  changes Off-Loading: Wound #1 Left,Posterior Calcaneus: Wedge shoe to: - Off loading shoes bilaterally to alleviate pressure from heels. Turn and reposition every 2 hours Prevalon Boot - bilaterally at bedtime Hyperbaric Oxygen Therapy: Wound #1 Left,Posterior Calcaneus: Evaluate for HBO Therapy - Per patient, he has claustrophobia. Evaluate for HBO Therapy - Per patient, he has claustrophobia. If appropriate for treatment, begin HBOT per protocol: 2.0 ATA  for 90 Minutes without Air Breaks 2.0 ATA for 90 Minutes without Air Breaks T Number of Treatments: - 40 otal One treatments per day (delivered Monday through Friday unless otherwise specified in Special Instructions below): One treatments per day (delivered Monday through Friday unless otherwise specified in Special Instructions below): Finger stick Blood Glucose Pre- and Post- HBOT Treatment. Finger stick Blood Glucose Pre- and Post- HBOT Treatment. Follow Hyperbaric Oxygen Glycemia Protocol Follow Hyperbaric Oxygen Glycemia Protocol Give two 4oz orange juices in addition to Glucerna when the glycemic protocol is used. Give two 4oz orange juices in addition to Glucerna when the glycemic protocol is used. Afrin (Oxymetazoline HCL) 0.05% nasal spray - 1 spray in both nostrils daily as needed prior to HBO treatment for difficulty clearing ears WOUND #1: - Calcaneus Wound Laterality: Left, Posterior Cleanser: Wound Cleanser Every Other Day/30 Days Discharge Instructions: Cleanse the wound with wound cleanser prior to applying Billy clean dressing using gauze sponges, not tissue or cotton balls. Peri-Wound Care: Skin Prep Every Other Day/30 Days Discharge Instructions: Use skin prep as directed Prim Dressing: Endoform 2x2 in Every Other Day/30 Days ary Discharge Instructions: Moisten with saline Secondary Dressing: Drawtex 4x4 in Every Other Day/30 Days Discharge Instructions: Apply over primary dressing cut to fit inside wound Secondary  Dressing: Zetuvit Plus Silicone Border Dressing 3x3 (in/in) Every Other Day/30 Days Discharge Instructions: Apply silicone border over primary dressing as directed. 1. I continued endoform as the primary dressing. Drawtex packing and Billy border foam. He has Billy surgical shoe. 2. Careful offloading #3 he has PAD although I think the bedside suggestion is there is enough blood flow to heal this. 4. Hyperbarics is on hold because of his wife's admission to the hospital. Not certain how long this will last at this point Electronic Signature(s) Signed: 05/08/2023 5:35:56 PM By: Shawn Stall RN, BSN Signed: 05/09/2023 12:09:51 PM By: [name] MD Previous Signature: 05/07/2023 4:32:13 PM Version By: [name] MD Entered By: Shawn Stall on 05/08/2023 17:30:10 [name], [name] ([id number]) [id number] Page 8 of 8 -------------------------------------------------------------------------------- SuperBill Details Patient Name: Date of Service: [name], [name] 05/07/2023 Medical Record Number: [id number] Patient Account Number: [id number] Date of Birth/Sex: Treating RN: 08-Sep-1945 (77 y.o. 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 E11.51 Type 2 diabetes mellitus with diabetic peripheral angiopathy without gangrene Facility Procedures : CPT4 Code: [id number] Description: 97597 - DEBRIDE WOUND 1ST 20 SQ CM OR < ICD-10 Diagnosis Description 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

## 2023-05-13 ENCOUNTER — Encounter (HOSPITAL_BASED_OUTPATIENT_CLINIC_OR_DEPARTMENT_OTHER): Payer: No Typology Code available for payment source | Admitting: Internal Medicine

## 2023-05-13 DIAGNOSIS — L97524 Non-pressure chronic ulcer of other part of left foot with necrosis of bone: Secondary | ICD-10-CM | POA: Diagnosis not present

## 2023-05-13 LAB — GLUCOSE, CAPILLARY
Glucose-Capillary: 153 mg/dL — ABNORMAL HIGH (ref 70–99)
Glucose-Capillary: 169 mg/dL — ABNORMAL HIGH (ref 70–99)

## 2023-05-14 ENCOUNTER — Encounter (HOSPITAL_BASED_OUTPATIENT_CLINIC_OR_DEPARTMENT_OTHER): Payer: No Typology Code available for payment source | Admitting: Internal Medicine

## 2023-05-16 ENCOUNTER — Encounter (HOSPITAL_BASED_OUTPATIENT_CLINIC_OR_DEPARTMENT_OTHER): Payer: No Typology Code available for payment source | Admitting: General Surgery

## 2023-05-16 DIAGNOSIS — L97524 Non-pressure chronic ulcer of other part of left foot with necrosis of bone: Secondary | ICD-10-CM | POA: Diagnosis not present

## 2023-05-16 LAB — GLUCOSE, CAPILLARY
Glucose-Capillary: 231 mg/dL — ABNORMAL HIGH (ref 70–99)
Glucose-Capillary: 185 mg/dL — ABNORMAL HIGH (ref 70–99)

## 2023-05-16 NOTE — Progress Notes (Addendum)
[name], [name] ([id number]) [id number] Page 1 of 2 Visit Report for 05/16/2023 HBO Details Patient Name: Date of Service: [name], [name] 05/16/2023 12:00 PM Medical Record Number: [id number] Patient Account Number: [id number] Date of Birth/Sex: Treating RN: 09/14/1945 (77 y.o. [name] Primary Care Muad [place]: [facility], [place] Other Clinician: [name] Referring Shrinika Blatz: Treating Gennell How/Extender: [name] [facility] [place] [facility] in Treatment: 11 HBO Treatment Course Details Treatment Course Number: 1 Ordering Jareli Highland: [name] T Treatments Ordered: otal 40 HBO Treatment Start Date: 03/11/2023 HBO Indication: Diabetic Ulcer(s) of the Lower Extremity Standard/Conservative Wound Care tried and failed greater than or equal to 30 days Wound #1 Left, Posterior Calcaneus , Wound #2 Right, Posterior Calcaneus HBO Treatment Details Treatment Number: 29 Patient Type: Outpatient Chamber Type: Monoplace Chamber Serial #: [id number] Treatment Protocol: 2.0 ATA with 90 minutes oxygen, and no air breaks Treatment Details Compression Rate Down: 1.0 psi / minute De-Compression Rate Up: 1.5 psi / minute Air breaks and breathing Decompress Decompress Compress Tx Pressure Begins Reached periods Begins Ends (leave unused spaces blank) Chamber Pressure (ATA 1 2 ------2 1 ) Clock Time (24 hr) 12:56 13:10 - - - - - - 14:40 14:50 Treatment Length: 114 (minutes) Treatment Segments: 4 Vital Signs Capillary Blood Glucose Reference Range: 80 - 120 mg / dl HBO Diabetic Blood Glucose Intervention Range: <131 mg/dl or >528 mg/dl Type: Time Vitals Blood Pulse: Respiratory Temperature: Capillary Blood Glucose Pulse Action Taken: Pressure: Rate: Glucose (mg/dl): Meter #: Oximetry (%) Taken: Pre 12:27 131/58 64 12 98.1 231 asymptomatic for hypotension Post 14:51 145/60 63 18 97.9 185 none per protocol Treatment  Response Treatment Toleration: Well Treatment Completion Status: Treatment Completed without Adverse Event Treatment Notes [name] arrived with normal vital signs. He prepared for treatment. After performing Billy safety check, the patient was placed in the chamber which was compressed with 100% oxygen at the rate of 1.5 psi/min after confirming normal ear equalization. He tolerated the treatment and the subsequent decompression of the chamber at the rate of 1.5 psi/min. He denied any issues with ear equalization and/or pain related to barotrauma. His post-treatment vital signs were within normal range. He was stable upon discharge with his son. Additional Procedure Documentation Tissue [name] Physician HBO Attestation: I certify that I supervised this HBO treatment in accordance with Medicare guidelines. Billy trained emergency response team is readily available per Yes hospital policies and procedures. Continue HBOT as ordered. [address] MADDIX, LEBEN ([id number]) [id number] Page 2 of 2 Electronic Signature(s) Signed: 05/17/2023 11:45:55 AM By: [name] CHT EMT BS , , Signed: 05/17/2023 12:24:20 PM By: [name] MD FACS Previous Signature: 05/16/2023 3:48:09 PM Version By: [name] MD FACS Previous Signature: 05/16/2023 3:05:13 PM Version By: [name] CHT EMT BS , , Entered By: [name] on 05/17/2023 08:45:55 -------------------------------------------------------------------------------- HBO Safety Checklist Details Patient Name: Date of Service: [name], [name] 05/16/2023 12:00 PM Medical Record Number: [id number] Patient Account Number: [id number] Date of Birth/Sex: Treating RN: 1945/09/23 (77 y.o. [name] Primary Care Alysah Carton: [name] Other Clinician: [name] Referring Domingo Fuson: Treating Azure Barrales/Extender: [name] [facility] [place] in Treatment:  11 HBO Safety Checklist Items Safety Checklist Consent Form Signed Patient voided / foley secured and emptied When did you last eato 0930 Two SEC sandwich, Milk, Coffee Last dose of injectable or oral agent 0800 Metformin Ostomy pouch emptied and vented if applicable NA All implantable devices assessed, documented and approved NA  Intravenous access site secured and place NA Valuables secured Linens and cotton and cotton/polyester blend (less than 51% polyester) Personal oil-based products / skin lotions / body lotions removed Wigs or hairpieces removed NA Smoking or tobacco materials removed NA Books / newspapers / magazines / loose paper removed Cologne, aftershave, perfume and deodorant removed Jewelry removed (may wrap wedding band) Make-up removed NA Hair care products removed Battery operated devices (external) removed Heating patches and chemical warmers removed Titanium eyewear removed Nail polish cured greater than 10 hours NA Casting material cured greater than 10 hours NA Hearing aids removed at home Loose dentures or partials removed dentures removed Prosthetics have been removed NA Patient demonstrates correct use of air break device (if applicable) Patient concerns have been addressed Patient grounding bracelet on and cord attached to chamber Specifics for Inpatients (complete in addition to above) Medication sheet sent with patient NA Intravenous medications needed or due during therapy sent with patient NA Drainage tubes (e.g. nasogastric tube or chest tube secured and vented) NA Endotracheal or Tracheotomy tube secured NA Cuff deflated of air and inflated with saline NA Airway suctioned NA Notes Paper version used prior to treatment start. Electronic Signature(s) Signed: 05/16/2023 3:03:25 PM By: [name] CHT EMT BS , , Entered By: [name] on 05/16/2023 12:03:25

## 2023-05-16 NOTE — Progress Notes (Signed)
CORDON, LARI ([id number]) [id number] Page 1 of 1 Visit Report for 05/16/2023 SuperBill Details Patient Name: Date of Service: [name], [name]. 05/16/2023 Medical Record Number: [id number] Patient Account Number: [id number] Date of Birth/Sex: Treating RN: Oct 28, 1945 (77 y.o. 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 E11.51 Type 2 diabetes mellitus with diabetic peripheral angiopathy without gangrene Facility Procedures CPT4 Code Description Modifier Quantity [id number] G0277-(Facility Use Only) HBOT full body chamber, , 4 ICD-10 Diagnosis Description 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

## 2023-05-16 NOTE — Progress Notes (Signed)
[name], [name] ([id number]) [id number] Page 1 of 1 Visit Report for 05/16/2023 Arrival Information Details Patient Name: Date of Service: [name], [name] 05/16/2023 12:00 PM Medical Record Number: [id number] Patient Account Number: [id number] Date of Birth/Sex: Treating RN: [birth year] (77 y.o. [name] Primary Care Caral Whan: [name] Other Clinician: [name] Referring Eulonda Andalon: Treating Annie Saephan/Extender: [name] [place] in Treatment: 11 Visit Information History Since Last Visit All ordered tests and consults were completed: Yes Patient Arrived: [name] Added or deleted any medications: No Arrival Time: 11:57 Any new allergies or adverse reactions: No Accompanied By: self Had a fall or experienced change in No Transfer Assistance: None activities of daily living that may affect Patient Identification Verified: Yes risk of falls: Secondary Verification Process Completed: Yes Signs or symptoms of abuse/neglect since last visito No Patient Requires Transmission-Based Precautions: No Hospitalized since last visit: No Patient Has Alerts: No Implantable device outside of the clinic excluding No cellular tissue based products placed in the center since last visit: Pain Present Now: No Electronic Signature(s) Signed: 05/16/2023 2:49:02 PM By: [name] CHT EMT BS , , Entered By: [name] on 05/16/2023 11:49:02 -------------------------------------------------------------------------------- Vitals Details Patient Name: Date of Service: [name], [name] 05/16/2023 12:00 PM Medical Record Number: [id number] Patient Account Number: [id number] Date of Birth/Sex: Treating RN: 06/30/1945 (77 y.o. [name] Primary Care Shamus Desantis: [name] Other Clinician: [name] Referring Tilman Mcclaren: Treating Kimble Delaurentis/Extender: [name]  [place] in Treatment: 11 Vital Signs Time Taken: 12:27 Temperature (F): 98.1 Height (in): 70 Pulse (bpm): 64 Weight (lbs): 196 Respiratory Rate (breaths/min): 12 Body Mass Index (BMI): 28.1 Blood Pressure (mmHg): 131/58 Capillary Blood Glucose (mg/dl): 086 Reference Range: 80 - 120 mg / dl Electronic Signature(s) Signed: 05/16/2023 2:49:35 PM By: [name] CHT EMT BS , , Entered By: [name] on 05/16/2023 11:49:35

## 2023-05-17 ENCOUNTER — Encounter (HOSPITAL_BASED_OUTPATIENT_CLINIC_OR_DEPARTMENT_OTHER): Payer: No Typology Code available for payment source | Admitting: General Surgery

## 2023-05-17 DIAGNOSIS — L97524 Non-pressure chronic ulcer of other part of left foot with necrosis of bone: Secondary | ICD-10-CM | POA: Diagnosis not present

## 2023-05-17 LAB — GLUCOSE, CAPILLARY
Glucose-Capillary: 221 mg/dL — ABNORMAL HIGH (ref 70–99)
Glucose-Capillary: 212 mg/dL — ABNORMAL HIGH (ref 70–99)

## 2023-05-17 NOTE — Progress Notes (Signed)
BENTLIE, MANSKER ([id number]) [id number] Page 1 of 1 Visit Report for 05/17/2023 SuperBill Details Patient Name: Date of Service: [name], [name] 05/17/2023 Medical Record Number: [id number] Patient Account Number: [id number] Date of Birth/Sex: Treating RN: [birth year] (77 y.o. 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 E11.51 Type 2 diabetes mellitus with diabetic peripheral angiopathy without gangrene Facility Procedures CPT4 Code Description Modifier Quantity [id number] G0277-(Facility Use Only) HBOT full body chamber, , 4 ICD-10 Diagnosis Description 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

## 2023-05-17 NOTE — Progress Notes (Addendum)
WAI, SOLT ([id number]) [id number] Page 1 of 2 Visit Report for 05/17/2023 HBO Details Patient Name: Date of Service: [name], [name] 05/17/2023 9:00 A M Medical Record Number: [id number] Patient Account Number: [id number] Date of Birth/Sex: Treating RN: Nov 12, 1945 (77 y.o. [name] Primary Care Chantal Worthey: [name] Other Clinician: [name] Referring Tannisha Kennington: Treating Ubaldo Daywalt/Extender: [name] [place] in Treatment: 11 HBO Treatment Course Details Treatment Course Number: 1 Ordering Veta Dambrosia: [name] T Treatments Ordered: otal 40 HBO Treatment Start Date: 03/11/2023 HBO Indication: Diabetic Ulcer(s) of the Lower Extremity Standard/Conservative Wound Care tried and failed greater than or equal to 30 days Wound #1 Left, Posterior Calcaneus , Wound #2 Right, Posterior Calcaneus HBO Treatment Details Treatment Number: 30 Patient Type: Outpatient Chamber Type: Monoplace Chamber Serial #: [id number] Treatment Protocol: 2.0 ATA with 90 minutes oxygen, and no air breaks Treatment Details Compression Rate Down: 1.0 psi / minute De-Compression Rate Up: 1.5 psi / minute Air breaks and breathing Decompress Decompress Compress Tx Pressure Begins Reached periods Begins Ends (leave unused spaces blank) Chamber Pressure (ATA 1 2 ------2 1 ) Clock Time (24 hr) 09:27 09:40 - - - - - - 11:10 11:18 Treatment Length: 111 (minutes) Treatment Segments: 4 Vital Signs Capillary Blood Glucose Reference Range: 80 - 120 mg / dl HBO Diabetic Blood Glucose Intervention Range: <131 mg/dl or >474 mg/dl Type: Time Vitals Blood Respiratory Capillary Blood Glucose Pulse Action Pulse: Temperature: Taken: Pressure: Rate: Glucose (mg/dl): Meter #: Oximetry (%) Taken: Pre 08:38 143/86 65 18 98.2 221 none per protocol Post 11:19 130/66 63 18 97.9 212 none per protocol Treatment Response Treatment  Toleration: Well Treatment Completion Status: Treatment Completed without Adverse Event Treatment Notes Mr. Arron arrived with normal vital signs. He prepared for treatment. After performing a safety check, the patient was placed in the chamber which was compressed with 100% oxygen at the rate of 1.5 psi/min after confirming normal ear equalization. He tolerated the treatment and the subsequent decompression of the chamber at the rate of 1.5 psi/min. He denied any issues with ear equalization and/or pain related to barotrauma. His post-treatment vital signs were within normal range. He was stable upon discharge with his son. Additional Procedure Documentation Tissue [name] Physician HBO Attestation: I certify that I supervised this HBO treatment in accordance with Medicare guidelines. A trained emergency response team is readily available per Yes hospital policies and procedures. Continue HBOT as ordered. [address] JARMEL, KMETZ ([id number]) [id number] Page 2 of 2 Electronic Signature(s) Signed: 05/17/2023 12:36:45 PM By: [name] MD FACS Previous Signature: 05/17/2023 12:25:36 PM Version By: [name] CHT EMT BS , , Entered By: [name] on 05/17/2023 09:35:51 -------------------------------------------------------------------------------- HBO Safety Checklist Details Patient Name: Date of Service: [name], [name] 05/17/2023 9:00 A M Medical Record Number: [id number] Patient Account Number: [id number] Date of Birth/Sex: Treating RN: 24-Jan-1946 (77 y.o. [name] Primary Care Sirius Woodford: [name] Other Clinician: [name] Referring Magan Winnett: Treating Ioane Bhola/Extender: [name] [place] in Treatment: 11 HBO Safety Checklist Items Safety Checklist Consent Form Signed Patient voided / foley secured and emptied When did you last eato 0730 - 2 Egg Ham sandwich,  orange, milk Last dose of injectable or oral agent 0630 - Metformin Ostomy pouch emptied and vented if applicable NA All implantable devices assessed, documented and approved NA Intravenous access site secured and place NA Valuables secured Linens and cotton and cotton/polyester blend (less than 51%  polyester) Personal oil-based products / skin lotions / body lotions removed Wigs or hairpieces removed NA Smoking or tobacco materials removed NA Books / newspapers / magazines / loose paper removed Cologne, aftershave, perfume and deodorant removed Jewelry removed (may wrap wedding band) Make-up removed NA Hair care products removed NA Battery operated devices (external) removed Heating patches and chemical warmers removed Titanium eyewear removed Nail polish cured greater than 10 hours NA Casting material cured greater than 10 hours NA Hearing aids removed at home Loose dentures or partials removed dentures removed Prosthetics have been removed NA Patient demonstrates correct use of air break device (if applicable) Patient concerns have been addressed Patient grounding bracelet on and cord attached to chamber Specifics for Inpatients (complete in addition to above) Medication sheet sent with patient NA Intravenous medications needed or due during therapy sent with patient NA Drainage tubes (e.g. nasogastric tube or chest tube secured and vented) NA Endotracheal or Tracheotomy tube secured NA Cuff deflated of air and inflated with saline NA Airway suctioned NA Notes Paper version used prior to treatment start. Electronic Signature(s) Signed: 05/17/2023 12:23:56 PM By: [name] CHT EMT BS , , Entered By: [name] on 05/17/2023 09:23:56

## 2023-05-17 NOTE — Progress Notes (Signed)
ESSA, HANDSHOE ([id number]) [id number] Page 1 of 2 Visit Report for 05/17/2023 Arrival Information Details Patient Name: Date of Service: [name], [name] 05/17/2023 9:00 A M Medical Record Number: [id number] Patient Account Number: [id number] Date of Birth/Sex: Treating RN: 25-Aug-1945 (77 y.o. [name] Primary Care Takira Sherrin: [name] Other Clinician: [name] Referring Tammie Yanda: Treating Rachyl Wuebker/Extender: [name] [place] in Treatment: 11 Visit Information History Since Last Visit All ordered tests and consults were completed: Yes Patient Arrived: [name] Added or deleted any medications: No Arrival Time: 08:32 Any new allergies or adverse reactions: No Accompanied By: son Had a fall or experienced change in No Transfer Assistance: None activities of daily living that may affect Patient Identification Verified: Yes risk of falls: Secondary Verification Process Completed: Yes Signs or symptoms of abuse/neglect since last visito No Patient Requires Transmission-Based Precautions: No Hospitalized since last visit: No Patient Has Alerts: No Implantable device outside of the clinic excluding No cellular tissue based products placed in the center since last visit: Pain Present Now: No Electronic Signature(s) Signed: 05/17/2023 11:47:02 AM By: [name] CHT EMT BS , , Entered By: [name] on 05/17/2023 08:47:02 -------------------------------------------------------------------------------- Encounter Discharge Information Details Patient Name: Date of Service: [name], [name] 05/17/2023 9:00 A M Medical Record Number: [id number] Patient Account Number: [id number] Date of Birth/Sex: Treating RN: Dec 15, 1945 (77 y.o. [name] Primary Care Melisssa Donner: [name] Other Clinician: [name] Referring Trevontae Lindahl: Treating Quinnton Bury/Extender: [name] [place] in Treatment: 11 Encounter Discharge Information Items Discharge Condition: Stable Ambulatory Status: Walker Discharge Destination: Home Transportation: Private Auto Accompanied By: son Schedule Follow-up Appointment: No Clinical Summary of Care: Electronic Signature(s) Signed: 05/17/2023 12:26:27 PM By: [name] CHT EMT BS , , Entered By: [name] on 05/17/2023 09:26:27 [name] ([id number]) [id number] Page 2 of 2 -------------------------------------------------------------------------------- Vitals Details Patient Name: Date of Service: [name], [name] 05/17/2023 9:00 A M Medical Record Number: [id number] Patient Account Number: [id number] Date of Birth/Sex: Treating RN: 10/16/1945 (77 y.o. [name] Primary Care Jhoanna Heyde: [name] Other Clinician: [name] Referring Patte Winkel: Treating Howard Patton/Extender: [name] [place] in Treatment: 11 Vital Signs Time Taken: 08:38 Temperature (F): 98.2 Height (in): 70 Pulse (bpm): 65 Weight (lbs): 196 Respiratory Rate (breaths/min): 18 Body Mass Index (BMI): 28.1 Blood Pressure (mmHg): 143/86 Capillary Blood Glucose (mg/dl): 433 Reference Range: 80 - 120 mg / dl Electronic Signature(s) Signed: 05/17/2023 11:50:19 AM By: [name] CHT EMT BS , , Entered By: [name] on 05/17/2023 08:50:19

## 2023-05-20 ENCOUNTER — Encounter (HOSPITAL_BASED_OUTPATIENT_CLINIC_OR_DEPARTMENT_OTHER): Payer: No Typology Code available for payment source | Admitting: General Surgery

## 2023-05-20 DIAGNOSIS — L97524 Non-pressure chronic ulcer of other part of left foot with necrosis of bone: Secondary | ICD-10-CM | POA: Diagnosis not present

## 2023-05-20 LAB — GLUCOSE, CAPILLARY
Glucose-Capillary: 206 mg/dL — ABNORMAL HIGH (ref 70–99)
Glucose-Capillary: 170 mg/dL — ABNORMAL HIGH (ref 70–99)

## 2023-05-20 NOTE — Progress Notes (Signed)
CHUKWUMA, TORNES ([id number]) [id number] Page 1 of 1 Visit Report for 05/20/2023 SuperBill Details Patient Name: Date of Service: A [name], [name] 05/20/2023 Medical Record Number: [id number] Patient Account Number: [id number] Date of Birth/Sex: Treating RN: 04-24-1946 (77 y.o. 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 E11.51 Type 2 diabetes mellitus with diabetic peripheral angiopathy without gangrene Facility Procedures CPT4 Code Description Modifier Quantity [id number] G0277-(Facility Use Only) HBOT full body chamber, , 4 ICD-10 Diagnosis Description 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

## 2023-05-20 NOTE — Progress Notes (Signed)
Billy, [name] ([id number]) [id number] Page 1 of 2 Visit Report for 04/25/2023 HBO Details Patient Name: Date of Service: [name], [name] 04/25/2023 12:00 PM Medical Record Number: [id number] Patient Account Number: [id number] Date of Birth/Sex: Treating RN: 1946/04/10 (77 y.o. [name] Primary Care Detrick Dani: [name] Other Clinician: Daryll Brod Referring Domonik Levario: Treating [name]/Extender: [name] [facility] [name] HBO Treatment Course Details Treatment Course Number: 1 Ordering [name]: [name] T Treatments Ordered: otal 40 HBO Treatment Start Date: 03/11/2023 HBO Indication: Diabetic Ulcer(s) of the Lower Extremity Standard/Conservative Wound Care tried and failed greater than or equal to 30 days Wound #1 Left, Posterior Calcaneus , Wound #2 Right, Posterior Calcaneus HBO Treatment Details Treatment Number: 23 Patient Type: Outpatient Chamber Type: Monoplace Chamber Serial #: [id number] Treatment Protocol: 2.0 ATA with 90 minutes oxygen, and no air breaks Treatment Details Compression Rate Down: 2.0 psi / minute De-Compression Rate Up: 1.0 psi / minute Air breaks and breathing Decompress Decompress Compress Tx Pressure Begins Reached periods Begins Ends (leave unused spaces blank) Chamber Pressure (ATA 1 2 ------2 1 ) Clock Time (24 hr) 11:53 12:03 - - - - - - 13:35 13:45 Treatment Length: 112 (minutes) Treatment Segments: 4 Vital Signs Capillary Blood Glucose Reference Range: 80 - 120 mg / dl HBO Diabetic Blood Glucose Intervention Range: <131 mg/dl or >381 mg/dl Time Vitals Blood Respiratory Capillary Blood Glucose Pulse Action Type: Pulse: Temperature: Taken: Pressure: Rate: Glucose (mg/dl): Meter #: Oximetry (%) Taken: Pre 11:40 141/64 74 18 97.6 170 1 Post 13:50 133/84 64 18 97.7 153 1 Treatment Response Treatment Toleration: Well Treatment Completion  Status: Treatment Completed without Adverse Event Treatment Notes Paper version used prior to treatment start. Additional Procedure Documentation Tissue Sevierity: Fat layer exposed Vivien Barretto Notes No concerns with treatment given Physician HBO Attestation: I certify that I supervised this HBO treatment in accordance with Medicare guidelines. A trained emergency response team is readily available per Yes hospital policies and procedures. Continue HBOT as ordered. [address] RUSTON, RAPOZO ([id number]) [id number] Page 2 of 2 Electronic Signature(s) Signed: 04/28/2023 9:47:40 AM By: Billy Najjar MD Previous Signature: 04/25/2023 5:41:20 PM Version By: Billy Najjar MD Entered By: [name] on 04/25/2023 14:43:17 -------------------------------------------------------------------------------- HBO Safety Checklist Details Patient Name: Date of Service: [name], [name] 04/25/2023 12:00 PM Medical Record Number: [id number] Patient Account Number: [id number] Date of Birth/Sex: Treating RN: May 25, 1945 (77 y.o. [name] Primary Care Zyrell Carmean: [name] Other Clinician: Daryll Brod Referring Dayne Dekay: Treating Yanai Hobson/Extender: [name] [facility] [name] HBO Safety Checklist Items Safety Checklist Consent Form Signed Patient voided / foley secured and emptied When did you last eato 10:30 eggs, sausage, grits, coffee Last dose of injectable or oral agent 8:25 Ostomy pouch emptied and vented if applicable NA All implantable devices assessed, documented and approved NA Intravenous access site secured and place NA Valuables secured Linens and cotton and cotton/polyester blend (less than 51% polyester) Personal oil-based products / skin lotions / body lotions removed Wigs or hairpieces removed NA Smoking or tobacco materials removed NA Books / newspapers / magazines / loose paper removed Cologne,  aftershave, perfume and deodorant removed Jewelry removed (may wrap wedding band) Make-up removed Hair care products removed Battery operated devices (external) removed NA Heating patches and chemical warmers removed NA Titanium eyewear removed NA Nail polish cured greater than 10 hours NA Casting material cured greater than 10 hours NA Hearing aids removed NA Loose  dentures or partials removed NA Prosthetics have been removed NA Patient demonstrates correct use of air break device (if applicable) Patient concerns have been addressed Patient grounding bracelet on and cord attached to chamber Specifics for Inpatients (complete in addition to above) Medication sheet sent with patient NA Intravenous medications needed or due during therapy sent with patient NA Drainage tubes (e.g. nasogastric tube or chest tube secured and vented) NA Endotracheal or Tracheotomy tube secured NA Cuff deflated of air and inflated with saline NA Airway suctioned NA Notes Paper version used prior to treatment start. Electronic Signature(s) Signed: 05/20/2023 3:37:49 PM By: Demetria Pore Entered By: Demetria Pore on 04/25/2023 11:39:46

## 2023-05-20 NOTE — Progress Notes (Signed)
CHINMAY, DOTHARD ([id number]) [id number] Page 1 of 2 Visit Report for 05/13/2023 Arrival Information Details Patient Name: Date of Service: [name], [name] 05/13/2023 2:00 PM Medical Record Number: [id number] Patient Account Number: [id number] Date of Birth/Sex: Treating RN: 08/27/1945 (77 y.o. M) Primary Care Mada Sadik: [name] Other Clinician: Referring Tishara Pizano: Treating Khali Perella/Extender: [name] [place] in Treatment: 10 Visit Information History Since Last Visit Added or deleted any medications: No Patient Arrived: [name] Any new allergies or adverse reactions: No Arrival Time: 02:00 Had a fall or experienced change in No Accompanied By: self activities of daily living that may affect Transfer Assistance: None risk of falls: Patient Identification Verified: Yes Signs or symptoms of abuse/neglect since last visito No Secondary Verification Process Completed: Yes Hospitalized since last visit: No Patient Requires Transmission-Based Precautions: No Implantable device outside of the clinic excluding No Patient Has Alerts: No cellular tissue based products placed in the center since last visit: Pain Present Now: No Electronic Signature(s) Signed: 05/20/2023 3:37:49 PM By: Demetria Pore Entered By: Demetria Pore on 05/13/2023 11:51:20 -------------------------------------------------------------------------------- Encounter Discharge Information Details Patient Name: Date of Service: [name], [name] 05/13/2023 2:00 PM Medical Record Number: [id number] Patient Account Number: [id number] Date of Birth/Sex: Treating RN: 07-21-1945 (77 y.o. M) Primary Care Valery Amedee: [name] Other Clinician: Referring Dezra Mandella: Treating Amahia Madonia/Extender: [facility] in Treatment: 10 Encounter Discharge Information Items Discharge Condition: Stable Ambulatory Status:  Walker Discharge Destination: Home Transportation: Private Auto Accompanied By: self Schedule Follow-up Appointment: Yes Clinical Summary of Care: Electronic Signature(s) Signed: 05/20/2023 3:37:49 PM By: Demetria Pore Entered By: Demetria Pore on 05/13/2023 13:41:41 [name] ([id number]) [id number] Page 2 of 2 -------------------------------------------------------------------------------- Vitals Details Patient Name: Date of Service: [name], [name] 05/13/2023 2:00 PM Medical Record Number: [id number] Patient Account Number: [id number] Date of Birth/Sex: Treating RN: May 02, 1946 (77 y.o. M) Primary Care Baker Kogler: [name] Other Clinician: Referring Lilee Aldea: Treating Bonner Larue/Extender: [name] [place] in Treatment: 10 Vital Signs Time Taken: 02:00 Temperature (F): 98.2 Height (in): 70 Pulse (bpm): 79 Weight (lbs): 196 Respiratory Rate (breaths/min): 18 Body Mass Index (BMI): 28.1 Blood Pressure (mmHg): 150/100 Capillary Blood Glucose (mg/dl): 272 Reference Range: 80 - 120 mg / dl Airway Pulse Oximetry (%): 99 Electronic Signature(s) Signed: 05/20/2023 3:37:49 PM By: Demetria Pore Entered By: Demetria Pore on 05/13/2023 12:53:05

## 2023-05-20 NOTE — Progress Notes (Signed)
KEIVEN, ACHUFF ([id number]) [id number] Page 1 of 1 Visit Report for 04/25/2023 SuperBill Details Patient Name: Date of Service: [name], [name] 04/25/2023 Medical Record Number: [id number] Patient Account Number: [id number] Date of Birth/Sex: Treating RN: 1945/11/12 (77 y.o. 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 E11.42 Type 2 diabetes mellitus with diabetic polyneuropathy E11.51 Type 2 diabetes mellitus with diabetic peripheral angiopathy without gangrene Facility Procedures CPT4 Code Description Modifier Quantity [id number] G0277-(Facility Use Only) HBOT full body chamber, , 4 Physician Procedures Quantity CPT4 Code Description Modifier [id number] - WC PHYS HYPERBARIC OXYGEN THERAPY 1 ICD-10 Diagnosis Description 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

## 2023-05-20 NOTE — Progress Notes (Signed)
JEFTE, SEAWOOD ([id number]) [id number] Page 1 of 2 Visit Report for 04/25/2023 Arrival Information Details Patient Name: Date of Service: [name], [name] 04/25/2023 12:00 PM Medical Record Number: [id number] Patient Account Number: [id number] Date of Birth/Sex: Treating RN: 08/06/1945 (77 y.o. [name] Primary Care Tassie Pollett: [name] Other Clinician: Daryll Brod Referring Yadira Hada: Treating Annalysse Shoemaker/Extender: [name] [name] Visit Information History Since Last Visit Added or deleted any medications: No Patient Arrived: [name] Any new allergies or adverse reactions: No Arrival Time: 11:18 Had a fall or experienced change in No Accompanied By: self activities of daily living that may affect Transfer Assistance: None risk of falls: Patient Identification Verified: Yes Signs or symptoms of abuse/neglect since last visito No Secondary Verification Process Completed: Yes Hospitalized since last visit: No Patient Requires Transmission-Based Precautions: No Implantable device outside of the clinic excluding No Patient Has Alerts: No cellular tissue based products placed in the center since last visit: Pain Present Now: No Electronic Signature(s) Signed: 05/20/2023 3:37:49 PM By: Demetria Pore Entered By: Demetria Pore on 04/25/2023 10:47:52 -------------------------------------------------------------------------------- Encounter Discharge Information Details Patient Name: Date of Service: [name], [name] 04/25/2023 12:00 PM Medical Record Number: [id number] Patient Account Number: [id number] Date of Birth/Sex: Treating RN: [birth year] (77 y.o. [name] Primary Care Alekai Pocock: [name] Other Clinician: Daryll Brod Referring Daina Cara: Treating Simona Rocque/Extender: [name] [name] Encounter Discharge Information  Items Discharge Condition: Stable Ambulatory Status: Walker Discharge Destination: Home Transportation: Private Auto Accompanied By: wife Schedule Follow-up Appointment: Yes Clinical Summary of Care: Electronic Signature(s) Signed: 05/20/2023 3:37:49 PM By: Demetria Pore Entered By: Demetria Pore on 04/25/2023 11:41:53 [name] ([id number]) [id number] Page 2 of 2 -------------------------------------------------------------------------------- Vitals Details Patient Name: Date of Service: [name], [name] 04/25/2023 12:00 PM Medical Record Number: [id number] Patient Account Number: [id number] Date of Birth/Sex: Treating RN: Sep 23, 1945 (77 y.o. [name] Primary Care Wei Newbrough: [name] Other Clinician: Daryll Brod Referring Keshan Reha: Treating Gagandeep Pettet/Extender: [name] [name] Vital Signs Time Taken: 11:40 Temperature (F): 97.6 Height (in): 70 Pulse (bpm): 74 Weight (lbs): 196 Respiratory Rate (breaths/min): 18 Body Mass Index (BMI): 28.1 Blood Pressure (mmHg): 141/64 Capillary Blood Glucose (mg/dl): 518 Reference Range: 80 - 120 mg / dl Electronic Signature(s) Signed: 05/20/2023 3:37:49 PM By: Demetria Pore Entered By: Demetria Pore on 04/25/2023 10:47:57

## 2023-05-20 NOTE — Progress Notes (Signed)
SANTANNA, SCARCE ([id number]) [id number] Page 1 of 1 Visit Report for 05/13/2023 SuperBill Details Patient Name: Date of Service: [name], [name] 05/13/2023 Medical Record Number: [id number] Patient Account Number: [id number] Date of Birth/Sex: Treating RN: 1945-06-23 (77 y.o. 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 E11.51 Type 2 diabetes mellitus with diabetic peripheral angiopathy without gangrene Facility Procedures CPT4 Code Description Modifier Quantity [id number] G0277-(Facility Use Only) HBOT full body chamber, , 4 Physician Procedures Quantity CPT4 Code Description Modifier [id number] - WC PHYS HYPERBARIC OXYGEN THERAPY 1 ICD-10 Diagnosis Description 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

## 2023-05-20 NOTE — Progress Notes (Addendum)
[name] ([id number]) [id number] Page 1 of 2 Visit Report for 05/20/2023 HBO Details Patient Name: Date of Service: [name], [name] 05/20/2023 12:00 PM Medical Record Number: [id number] Patient Account Number: [id number] Date of Birth/Sex: Treating RN: 04-26-1946 (77 y.o. [name] Primary Care Rylee Huestis: [name] Other Clinician: [name] Referring Georgene Kopper: Treating Molli Gethers/Extender: [name] [facility], [place] in Treatment: 11 HBO Treatment Course Details Treatment Course Number: 1 Ordering Amiri Riechers: [name] T Treatments Ordered: otal 40 HBO Treatment Start Date: 03/11/2023 HBO Indication: Diabetic Ulcer(s) of the Lower Extremity Standard/Conservative Wound Care tried and failed greater than or equal to 30 days Wound #1 Left, Posterior Calcaneus , Wound #2 Right, Posterior Calcaneus HBO Treatment Details Treatment Number: 31 Patient Type: Outpatient Chamber Type: Monoplace Chamber Serial #: [id number] Treatment Protocol: 2.0 ATA with 90 minutes oxygen, and no air breaks Treatment Details Compression Rate Down: 1.0 psi / minute De-Compression Rate Up: 1.5 psi / minute Air breaks and breathing Decompress Decompress Compress Tx Pressure Begins Reached periods Begins Ends (leave unused spaces blank) Chamber Pressure (ATA 1 2 ------2 1 ) Clock Time (24 hr) 12:25 12:39 - - - - - - 14:09 14:20 Treatment Length: 115 (minutes) Treatment Segments: 4 Vital Signs Capillary Blood Glucose Reference Range: 80 - 120 mg / dl HBO Diabetic Blood Glucose Intervention Range: <131 mg/dl or >160 mg/dl Type: Time Vitals Blood Respiratory Capillary Blood Glucose Pulse Action Pulse: Temperature: Taken: Pressure: Rate: Glucose (mg/dl): Meter #: Oximetry (%) Taken: Pre 12:03 145/88 70 18 97.9 206 none per protocol Post 14:23 150/66 62 18 97.7 170 none per protocol Treatment Response Treatment  Toleration: Well Treatment Completion Status: Treatment Completed without Adverse Event Treatment Notes Mr. Salb arrived with normal vital signs. He prepared for treatment. After performing a safety check, the patient was placed in the chamber which was compressed with 100% oxygen at the rate of 1 psi/min. He tolerated the treatment and the subsequent decompression of the chamber at the rate of 1.5 psi/min. He denied any issues with ear equalization and/or pain related to barotrauma. His post-treatment vital signs were within normal range. He was stable upon discharge with his son. Additional Procedure Documentation Tissue [name] Physician HBO Attestation: I certify that I supervised this HBO treatment in accordance with Medicare guidelines. A trained emergency response team is readily available per Yes hospital policies and procedures. Continue HBOT as ordered. [address] BROXTON, SIEVERS ([id number]) [id number] Page 2 of 2 Electronic Signature(s) Signed: 05/20/2023 4:41:21 PM By: [name] MD FACS Previous Signature: 05/20/2023 3:56:24 PM Version By: [name] CHT EMT BS , , Entered By: [name] on 05/20/2023 13:38:07 -------------------------------------------------------------------------------- HBO Safety Checklist Details Patient Name: Date of Service: [name], [name] 05/20/2023 12:00 PM Medical Record Number: [id number] Patient Account Number: [id number] Date of Birth/Sex: Treating RN: July 24, 1945 (77 y.o. [name] Primary Care Lylie Blacklock: [name] Other Clinician: [name] Referring Chadd Tollison: Treating Nayra Coury/Extender: [name], [place] in Treatment: 11 HBO Safety Checklist Items Safety Checklist Consent Form Signed Patient voided / foley secured and emptied When did you last eato 1100 - 2 egg, bacon, bread, orange Last dose of injectable or oral agent  0900 - Metformin Ostomy pouch emptied and vented if applicable NA All implantable devices assessed, documented and approved NA Intravenous access site secured and place NA Valuables secured Linens and cotton and cotton/polyester blend (less than 51% polyester) Personal oil-based products / skin lotions /  body lotions removed Wigs or hairpieces removed NA Smoking or tobacco materials removed NA Books / newspapers / magazines / loose paper removed Cologne, aftershave, perfume and deodorant removed Jewelry removed (may wrap wedding band) Make-up removed NA Hair care products removed Battery operated devices (external) removed Heating patches and chemical warmers removed Titanium eyewear removed Nail polish cured greater than 10 hours NA Casting material cured greater than 10 hours NA Hearing aids removed at home Loose dentures or partials removed dentures removed Prosthetics have been removed NA Patient demonstrates correct use of air break device (if applicable) Patient concerns have been addressed Patient grounding bracelet on and cord attached to chamber Specifics for Inpatients (complete in addition to above) Medication sheet sent with patient NA Intravenous medications needed or due during therapy sent with patient NA Drainage tubes (e.g. nasogastric tube or chest tube secured and vented) NA Endotracheal or Tracheotomy tube secured NA Cuff deflated of air and inflated with saline NA Airway suctioned NA Notes Paper version used prior to treatment start. Electronic Signature(s) Signed: 05/20/2023 3:55:02 PM By: [name] CHT EMT BS , , Previous Signature: 05/20/2023 3:54:06 PM Version By: [name] CHT EMT BS , , Entered By: [name] on 05/20/2023 12:55:01

## 2023-05-20 NOTE — Progress Notes (Signed)
RIGLEY, FALGOUT ([id number]) [id number] Page 1 of 2 Visit Report for 05/20/2023 Arrival Information Details Patient Name: Date of Service: [name], [name] 05/20/2023 12:00 PM Medical Record Number: [id number] Patient Account Number: [id number] Date of Birth/Sex: Treating RN: 07/02/1945 (77 y.o. [name] Primary Care [name]: Clinic, [name] Other Clinician: Haywood [name] Referring Ranya Fiddler: Treating [name]/Extender: [name] [facility], [name] in Treatment: 11 Visit Information History Since Last Visit All ordered tests and consults were completed: Yes Patient Arrived: [name] Added or deleted any medications: No Arrival Time: 11:59 Any new allergies or adverse reactions: No Accompanied By: self Had a fall or experienced change in No Transfer Assistance: None activities of daily living that may affect Patient Identification Verified: Yes risk of falls: Secondary Verification Process Completed: Yes Signs or symptoms of abuse/neglect since last visito No Patient Requires Transmission-Based Precautions: No Hospitalized since last visit: No Patient Has Alerts: No Implantable device outside of the clinic excluding No cellular tissue based products placed in the center since last visit: Pain Present Now: No Electronic Signature(s) Signed: 05/20/2023 3:52:25 PM By: [name] CHT EMT BS , , Entered By: [name] on 05/20/2023 15:52:25 -------------------------------------------------------------------------------- Encounter Discharge Information Details Patient Name: Date of Service: [name], [name] 05/20/2023 12:00 PM Medical Record Number: [id number] Patient Account Number: [id number] Date of Birth/Sex: Treating RN: 20-Feb-1946 (77 y.o. [name] Primary Care [name]: Clinic, [name] Other Clinician: Haywood [name] Referring [name]: Treating Surah Pelley/Extender: [name] [facility], [name] in Treatment: 11 Encounter Discharge Information Items Discharge Condition: Stable Ambulatory Status: Walker Discharge Destination: Home Transportation: Private Auto Accompanied By: [name] Schedule Follow-up Appointment: No Clinical Summary of Care: Electronic Signature(s) Signed: 05/20/2023 3:57:35 PM By: [name] CHT EMT BS , , Entered By: [name] on 05/20/2023 15:57:35 [name], [name] ([id number]) [id number] Page 2 of 2 -------------------------------------------------------------------------------- Vitals Details Patient Name: Date of Service: [name], [name] 05/20/2023 12:00 PM Medical Record Number: [id number] Patient Account Number: [id number] Date of Birth/Sex: Treating RN: 19-Oct-1945 (77 y.o. [name] Primary Care Adelia Baptista: Clinic, [name] Other Clinician: Haywood [name] Referring Korissa Horsford: Treating Freeman Borba/Extender: [name] [facility], [name] in Treatment: 11 Vital Signs Time Taken: 12:03 Temperature (F): 97.9 Height (in): 70 Pulse (bpm): 70 Weight (lbs): 196 Respiratory Rate (breaths/min): 18 Body Mass Index (BMI): 28.1 Blood Pressure (mmHg): 145/88 Capillary Blood Glucose (mg/dl): 063 Reference Range: 80 - 120 mg / dl Electronic Signature(s) Signed: 05/20/2023 3:52:48 PM By: [name] CHT EMT BS , , Entered By: [name] on 05/20/2023 15:52:48

## 2023-05-20 NOTE — Progress Notes (Signed)
[name], [name] ([id number]) [id number] Page 1 of 2 Visit Report for 05/13/2023 HBO Details Patient Name: Date of Service: [name], [name] 05/13/2023 2:00 PM Medical Record Number: [id number] Patient Account Number: [id number] Date of Birth/Sex: Treating RN: 1945/08/17 (77 y.o. M) Primary Care Pharrell Ledford: [name] Other Clinician: Referring Novis League: Treating Remiel Corti/Extender: [name], [place] in Treatment: 10 HBO Treatment Course Details Treatment Course Number: 1 Ordering Harneet Noblett: [name] T Treatments Ordered: otal 40 HBO Treatment Start Date: 03/11/2023 HBO Indication: Diabetic Ulcer(s) of the Lower Extremity Standard/Conservative Wound Care tried and failed greater than or equal to 30 days Wound #1 Left, Posterior Calcaneus , Wound #2 Right, Posterior Calcaneus HBO Treatment Details Treatment Number: 28 Patient Type: Outpatient Chamber Type: Monoplace Chamber Serial #: [id number] Treatment Protocol: 2.0 ATA with 90 minutes oxygen, and no air breaks Treatment Details Compression Rate Down: 2.0 psi / minute De-Compression Rate Up: 1.5 psi / minute Air breaks and breathing Decompress Decompress Compress Tx Pressure Begins Reached periods Begins Ends (leave unused spaces blank) Chamber Pressure (ATA 1 2 ------2 1 ) Clock Time (24 hr) 14:32 14:44 - - - - - - 16:14 16:22 Treatment Length: 110 (minutes) Treatment Segments: 4 Vital Signs Capillary Blood Glucose Reference Range: 80 - 120 mg / dl HBO Diabetic Blood Glucose Intervention Range: <131 mg/dl or >440 mg/dl Time Vitals Blood Respiratory Capillary Blood Glucose Pulse Action Type: Pulse: Temperature: Taken: Pressure: Rate: Glucose (mg/dl): Meter #: Oximetry (%) Taken: Pre 02:00 150/100 79 18 98.2 153 1 99 Post 16:23 145/8 80 18 98 169 1 Treatment Response Treatment Toleration: Well Treatment Completion Status: Treatment Completed  without Adverse Event Electronic Signature(s) Signed: 05/14/2023 11:51:18 AM By: [name] MD Signed: 05/20/2023 3:37:49 PM By: Demetria Pore Entered By: Demetria Pore on 05/13/2023 13:41:02 [name], [name] ([id number]) [id number] Page 2 of 2 -------------------------------------------------------------------------------- HBO Safety Checklist Details Patient Name: Date of Service: [name], [name] 05/13/2023 2:00 PM Medical Record Number: [id number] Patient Account Number: [id number] Date of Birth/Sex: Treating RN: 24-May-1945 (77 y.o. M) Primary Care Brodric Schauer: [name] Other Clinician: Referring Kerria Sapien: Treating Asjia Berrios/Extender: [name], [place] in Treatment: 10 HBO Safety Checklist Items Safety Checklist Consent Form Signed Patient voided / foley secured and emptied When did you last eato 05/13/23 Last dose of injectable or oral agent 05/13/23 eggs, sausage, milk bread Ostomy pouch emptied and vented if applicable NA All implantable devices assessed, documented and approved NA Intravenous access site secured and place NA Valuables secured Linens and cotton and cotton/polyester blend (less than 51% polyester) Personal oil-based products / skin lotions / body lotions removed Wigs or hairpieces removed NA Smoking or tobacco materials removed NA Books / newspapers / magazines / loose paper removed Cologne, aftershave, perfume and deodorant removed Jewelry removed (may wrap wedding band) Make-up removed NA Hair care products removed Battery operated devices (external) removed NA Heating patches and chemical warmers removed NA Titanium eyewear removed NA Nail polish cured greater than 10 hours NA Casting material cured greater than 10 hours NA Hearing aids removed NA Loose dentures or partials removed NA Prosthetics have been removed NA Patient demonstrates correct use of air break device (if  applicable) Patient concerns have been addressed Patient grounding bracelet on and cord attached to chamber Specifics for Inpatients (complete in addition to above) Medication sheet sent with patient NA Intravenous medications needed or due during therapy sent with patient NA Drainage tubes (e.g. nasogastric tube or chest tube secured  and vented) NA Endotracheal or Tracheotomy tube secured NA Cuff deflated of air and inflated with saline NA Airway suctioned NA Electronic Signature(s) Signed: 05/20/2023 3:37:49 PM By: Demetria Pore Entered By: Demetria Pore on 05/13/2023 13:40:21

## 2023-05-21 ENCOUNTER — Encounter (HOSPITAL_BASED_OUTPATIENT_CLINIC_OR_DEPARTMENT_OTHER): Payer: No Typology Code available for payment source | Admitting: General Surgery

## 2023-05-21 ENCOUNTER — Encounter (HOSPITAL_BASED_OUTPATIENT_CLINIC_OR_DEPARTMENT_OTHER): Payer: No Typology Code available for payment source | Admitting: Internal Medicine

## 2023-05-21 DIAGNOSIS — L97524 Non-pressure chronic ulcer of other part of left foot with necrosis of bone: Secondary | ICD-10-CM | POA: Diagnosis not present

## 2023-05-21 LAB — GLUCOSE, CAPILLARY
Glucose-Capillary: 234 mg/dL — ABNORMAL HIGH (ref 70–99)
Glucose-Capillary: 246 mg/dL — ABNORMAL HIGH (ref 70–99)

## 2023-05-21 NOTE — Progress Notes (Addendum)
 [name] [name] [name] [name] ([id number]) [id number] Page 1 of 9 Visit Report for 05/21/2023 Arrival Information Details Patient Name: Date of Service: Billy RNO [name] [name] [name] S. 05/21/2023 12:30 PM Medical Record Number: [id number] Patient Account Number: [id number] Date of Birth/Sex: Treating RN: [birth year] (77 y.o. [name] [name] Primary Care [name] [name]: Clinic, [name] Other Clinician: Referring [name] [name] Clinic, [name] Duos in Treatment: 12 Visit Information History Since Last Visit Added or deleted any medications: No Patient Arrived: [name] Any new allergies or adverse reactions: No Arrival Time: 12:45 Had Billy fall or experienced change in No Accompanied By: spouse activities of daily living that may affect Transfer Assistance: None risk of falls: Patient Identification Verified: Yes Signs or symptoms of abuse/neglect since last No Secondary Verification Process Completed: Yes visito Patient Requires Transmission-Based Precautions: No Hospitalized since last visit: No Patient Has Alerts: No Implantable device outside of the clinic excluding No cellular tissue based products placed in the center since last visit: Has Dressing in Place as Prescribed: Yes Has Footwear/Offloading in Place as Prescribed: Yes Left: Other:globoped heel offloader Right: Other:globoped heel offloader Pain Present Now: No Electronic Signature(s) Signed: 05/21/2023 4:46:54 PM By: Merleen Handing RN, BSN Entered By: Merleen [name] on 05/21/2023 09:46:31 -------------------------------------------------------------------------------- Encounter Discharge Information Details Patient Name: Date of Service: Billy RNO [name] [name] [name] S. 05/21/2023 12:30 PM Medical Record Number: [id number] Patient Account Number: [id number] Date of Birth/Sex: Treating RN: 12-22-1945 (77 y.o. [name] [name] Primary Care Cheryll Keisler: Clinic, [name] Other  Clinician: Referring [name] [name]: Treating [name] [name]/[name] [name]: [name] [facility], [name] Duos in Treatment: 12 Encounter Discharge Information Items Post Procedure Vitals Discharge Condition: Stable Temperature (F): 97.7 Ambulatory Status: Walker Pulse (bpm): 59 Discharge Destination: Home Respiratory Rate (breaths/min): 18 Transportation: Private Auto Blood Pressure (mmHg): 128/60 Accompanied By: spouse Schedule Follow-up Appointment: Yes Clinical Summary of Care: Patient Declined Electronic Signature(s) Signed: 05/21/2023 4:46:54 PM By: Merleen Handing RN, BSN Entered By: Merleen [name] on 05/21/2023 10:23:15 [name] [name] ([id number]) [id number] Page 2 of 9 -------------------------------------------------------------------------------- Lower Extremity Assessment Details Patient Name: Date of Service: Billy RNO [name] [name] [name] S. 05/21/2023 12:30 PM Medical Record Number: [id number] Patient Account Number: [id number] Date of Birth/Sex: Treating RN: 1945-10-25 (77 y.o. [name] [name] Primary Care Kijana Estock: Clinic, [name] Other Clinician: Referring Trudee Chirino: Treating [name] [name]/[name] [name]: [name] [facility], [name] Duos in Treatment: 12 Edema Assessment Assessed: [Left: No] [Right: No] Edema: [Left: Yes] [Right: No] Calf Left: Right: Point of Measurement: From Medial Instep 31 cm Ankle Left: Right: Point of Measurement: From Medial Instep 21 cm Vascular Assessment Pulses: Dorsalis Pedis Palpable: [Left:No] [Right:Yes] Extremity colors, hair growth, and conditions: Extremity Color: [Left:Normal] [Right:Normal] Hair Growth on Extremity: [Left:No] Temperature of Extremity: [Left:Warm] [Right:Warm] Capillary Refill: [Left:< 3 seconds] [Right:< 3 seconds] Dependent Rubor: [Left:No No] Electronic Signature(s) Signed: 05/21/2023 4:46:54 PM By: Merleen Handing RN, BSN Entered By: Merleen [name] on 05/21/2023  09:52:59 -------------------------------------------------------------------------------- Multi Wound Chart Details Patient Name: Date of Service: [name] [name] [name] [name] S. 05/21/2023 12:30 PM Medical Record Number: [id number] Patient Account Number: [id number] Date of Birth/Sex: Treating RN: [birth year] (77 y.o. M) Primary Care [name] [name]: Clinic, [name] Other Clinician: Referring [name] [name]: Treating [name] [name]/[name] [name]: [name] [facility], [name] Duos in Treatment: 12 Vital Signs Height(in): 70 Capillary Blood Glucose(mg/dl): 753 Weight(lbs): 803 Pulse(bpm): 59 Body Mass Index(BMI): 28.1 Blood Pressure(mmHg): 128/60 Temperature(F): 97.7 Respiratory Rate(breaths/min): 18 [1:Photos:] [[name]:[name]] Left, Posterior Calcaneus [name] [name] Wound Location: Blister [name] [name] Wounding Event: Diabetic Wound/Ulcer of the Lower [name] [name] Primary Etiology: Extremity Arrhythmia, Coronary Artery Disease,  [name] [name] Comorbid History: Hypertension, Peripheral Venous Disease, Type II Diabetes, Osteoarthritis, Neuropathy 01/20/2023 [name] [name] Date Acquired: 12 [name] [name] Weeks of Treatment: Open [name] [name] Wound Status: No [name] [name] Wound Recurrence: 1x0.8x0.5 [name] [name] Measurements L x W x D (cm) 0.628 [name] [name] Billy (cm) : rea 0.314 [name] [name] Volume (cm) : 63.00% [name] [name] % Reduction in Billy rea: 7.40% [name] [name] % Reduction in Volume: Grade 3 [name] [name] Classification: Medium [name] [name] Exudate Billy mount: Serosanguineous [name] [name] Exudate Type: red, brown [name] [name] Exudate Color: Distinct, outline attached [name] [name] Wound Margin: Medium (34-66%) [name] [name] Granulation Billy mount: Red, Pink [name] [name] Granulation Quality: Medium (34-66%) [name] [name] Necrotic Billy mount: Fat Layer (Subcutaneous Tissue): Yes [name] [name] Exposed Structures: Fascia: No Tendon: No Muscle: No Joint: No Bone: No None [name] [name] Epithelialization: Debridement - Excisional [name] [name] Debridement: Pre-procedure Verification/Time Out 13:05 [name]  [name] Taken: Lidocaine  4% Topical Solution [name] [name] Pain Control: Necrotic/Eschar, Subcutaneous, [name] [name] Tissue Debrided: Slough Skin/Subcutaneous Tissue [name] [name] Level: 0.63 [name] [name] Debridement Billy (sq cm): rea Curette [name] [name] Instrument: Minimum [name] [name] Bleeding: Pressure [name] [name] Hemostasis Achieved: 5 [name] [name] Procedural Pain: 3 [name] [name] Post Procedural Pain: Debridement Treatment Response: Procedure was tolerated well [name] [name] Post Debridement Measurements L x 1x0.8x0.5 [name] [name] W x D (cm) 0.314 [name] [name] Post Debridement Volume: (cm) No Abnormalities Noted [name] [name] Periwound Skin Texture: Maceration: No [name] [name] Periwound Skin Moisture: Dry/Scaly: No No Abnormalities Noted [name] [name] Periwound Skin Color: No Abnormality [name] [name] Temperature: Yes [name] [name] Tenderness on Palpation: Debridement [name] [name] Procedures Performed: Treatment Notes Wound #1 (Calcaneus) Wound Laterality: Left, Posterior Cleanser Wound Cleanser Discharge Instruction: Cleanse the wound with wound cleanser prior to applying Billy clean dressing using gauze sponges, not tissue or cotton balls. Billy-Wound Care Skin Prep Discharge Instruction: Use skin prep as directed Topical Primary Dressing Endoform 2x2 in Discharge Instruction: Moisten with saline [name] [name] [name] [name] ([id number]) [phone].pdf Page 4 of 9 Secondary Dressing Drawtex 4x4 in Discharge Instruction: Apply over primary dressing cut to fit inside wound Zetuvit Plus Silicone Border Dressing 3x3 (in/in) Discharge Instruction: Apply silicone border over primary dressing as directed. Secured With Compression Wrap Compression Stockings Facilities Manager) Signed: 05/21/2023 3:02:08 PM By: [name] Nest MD FACS Entered By: [name] [name] on 05/21/2023 10:25:41 -------------------------------------------------------------------------------- Multi-Disciplinary Care Plan Details Patient Name: Date of Service: Billy  RNO [name] [name] [name] S. 05/21/2023 12:30 PM Medical Record Number: [id number] Patient Account Number: [id number] Date of Birth/Sex: Treating RN: March 08, 1946 (77 y.o. [name] [name] Primary Care Dickie Labarre: Clinic, [name] Other Clinician: Referring Deny Chevez: Treating [place]/[name] [name]: [name], [name] Duos in Treatment: 12 Multidisciplinary Care Plan reviewed with physician Active Inactive HBO Nursing Diagnoses: Potential for barotraumas to ears, sinuses, teeth, and lungs or cerebral gas embolism related to changes in atmospheric pressure inside hyperbaric oxygen  chamber Potential for oxygen  toxicity seizures related to delivery of 100% oxygen  at an increased atmospheric pressure Potential for pulmonary oxygen  toxicity related to delivery of 100% oxygen  at an increased atmospheric pressure Goals: Barotrauma will be prevented during HBO2 Date Initiated: 05/21/2023 T arget Resolution Date: 06/18/2023 Goal Status: Active Patient and/or family will be able to state/discuss factors appropriate to the management of their disease process during treatment Date Initiated: 05/21/2023 T arget Resolution Date: 06/18/2023 Goal Status: Active Patient will tolerate the hyperbaric oxygen  therapy treatment Date Initiated: 05/21/2023 T arget Resolution Date: 06/18/2023 Goal Status: Active Patient will tolerate the internal climate of  the chamber Date Initiated: 05/21/2023 T arget Resolution Date: 06/18/2023 Goal Status: Active Patient/caregiver will verbalize understanding of HBO goals, rationale, procedures and potential hazards Date Initiated: 05/21/2023 T arget Resolution Date: 06/18/2023 Goal Status: Active Signs and symptoms of pulmonary oxygen  toxicity will be recognized and promptly addressed Date Initiated: 05/21/2023 T arget Resolution Date: 06/18/2023 Goal Status: Active Signs and symptoms of seizure will be recognized and promptly addressed ; seizing patients will  suffer no harm Date Initiated: 05/21/2023 T arget Resolution Date: 06/18/2023 Goal Status: Active Interventions: Administer decongestants, per physician orders, prior to HBO2 Administer the correct therapeutic gas delivery based on the patients needs and limitations, per physician order ZYRON, DEELEY ([id number]) [phone] Page 5 of 9 Assess and provide for patients comfort related to the hyperbaric environment and equalization of middle ear Assess for signs and symptoms related to adverse events, including but not limited to confinement anxiety, pneumothorax, oxygen  toxicity and baurotrauma Assess patient for any history of confinement anxiety Assess patient's knowledge and expectations regarding hyperbaric medicine and provide education related to the hyperbaric environment, goals of treatment and prevention of adverse events Implement protocols to decrease risk of pneumothorax in high risk patients Notes: Wound/Skin Impairment Nursing Diagnoses: Knowledge deficit related to smoking impact on wound healing Goals: Patient/caregiver will verbalize understanding of skin care regimen Date Initiated: 02/26/2023 Target Resolution Date: 06/21/2023 Goal Status: Active Interventions: Assess patient/caregiver ability to obtain necessary supplies Assess patient/caregiver ability to perform ulcer/skin care regimen upon admission and as needed Assess ulceration(s) every visit Provide education on ulcer and skin care Screen for HBO Treatment Activities: Consult for HBO : 02/26/2023 Skin care regimen initiated : 02/26/2023 Topical wound management initiated : 02/26/2023 Notes: Electronic Signature(s) Signed: 05/21/2023 4:46:54 PM By: Merleen Handing RN, BSN Entered By: Merleen [name] on 05/21/2023 10:02:51 -------------------------------------------------------------------------------- Non-Wound Condition Assessment Details Patient Name: Date of Service: [name] [name],  [name] [name] S. 05/21/2023 12:30 PM Medical Record Number: [id number] Patient Account Number: [id number] Date of Birth/Sex: Treating RN: 26-May-1945 (77 y.o. [name] [name] Primary Care Sharleen Szczesny: Clinic, [name] Other Clinician: Referring Mario Coronado: Treating Sadeen Wiegel/[name] [name]: [name], [name] Duos in Treatment: 12 Non-Wound Condition: Condition: Other Dermatologic Condition Location: Foot Side: Right Notes: blister to posterior right calcaneus [address] [name] [name] [name] [name] ([id number]) [id number] Page 6 of 9 Texture Color No Abnormalities Noted: Yes No Abnormalities Noted: Yes Moisture Temperature / Pain No Abnormalities Noted: Yes Temperature: No Abnormality Tenderness on Palpation: Yes Notes intact blister Electronic Signature(s) Signed: 05/21/2023 4:46:54 PM By: Merleen Handing RN, BSN Entered By: Merleen [name] on 05/21/2023 09:58:37 -------------------------------------------------------------------------------- Pain Assessment Details Patient Name: Date of Service: [name] [name] [name] [name] S. 05/21/2023 12:30 PM Medical Record Number: [id number] Patient Account Number: [id number] Date of Birth/Sex: Treating RN: [birth year] (77 y.o. [name] [name] Primary Care Lasharon Dunivan: Clinic, [name] Other Clinician: Referring Mayumi Summerson: Treating Amayrany Cafaro/[name] [name]: [name], [name] Duos in Treatment: 12 Active Problems Location of Pain Severity and Description of Pain Patient Has Paino Yes Site Locations Pain Location: Pain in Ulcers With Dressing Change: Yes Duration of the Pain. Constant / Intermittento Intermittent Rate the pain. Current Pain Level: 0 Worst Pain Level: 6 Least Pain Level: 0 Character of Pain Describe the Pain: Aching Pain Management and Medication Current Pain Management: Medication: Yes Is the Current Pain Management Adequate: Adequate How does your wound  impact your activities of daily livingo Sleep: No Bathing: No Appetite: No Relationship With Others: No Bladder Continence: No Emotions: No Bowel Continence: No Work:  No Toileting: No Hobbies: No Dressing: No Electronic Signature(s) Signed: 05/21/2023 4:46:54 PM By: Merleen Handing RN, BSN Entered By: Merleen [name] on 05/21/2023 09:47:21 [name] [name] [name] [name] ([id number]) [id number] Page 7 of 9 -------------------------------------------------------------------------------- Patient/Caregiver Education Details Patient Name: Date of Service: Billy RNO [name] [name] [name] S. 12/31/2024andnbsp12:30 PM Medical Record Number: [id number] Patient Account Number: [id number] Date of Birth/Gender: Treating RN: Feb 02, 1946 (77 y.o. [name] [name] Primary Care Physician: Clinic, [name] Other Clinician: Referring Physician: Treating Physician/[name] [name]: [name], [name] Duos in Treatment: 12 Education Assessment Education Provided To: Patient Education Topics Provided Elevated Blood Sugar/ Impact on Healing: Methods: Explain/Verbal Responses: Reinforcements needed, State content correctly Hyperbaric Oxygenation: Methods: Explain/Verbal Responses: Reinforcements needed, State content correctly Offloading: Methods: Explain/Verbal Responses: Reinforcements needed, State content correctly Wound/Skin Impairment: Methods: Explain/Verbal Responses: Reinforcements needed, State content correctly Electronic Signature(s) Signed: 05/21/2023 4:46:54 PM By: Merleen Handing RN, BSN Entered By: Merleen [name] on 05/21/2023 10:03:23 -------------------------------------------------------------------------------- Wound Assessment Details Patient Name: Date of Service: [name] [name] [name] [name] S. 05/21/2023 12:30 PM Medical Record Number: [id number] Patient Account Number: [id number] Date of Birth/Sex: Treating RN: 22-Aug-1945 (77 y.o. [name] [name] Primary  Care Garnie Borchardt: Clinic, [name] Other Clinician: Referring Beatris Belen: Treating Tajah Schreiner/[name] [name]: [name], [name] Duos in Treatment: 12 Wound Status Wound Number: 1 Primary Diabetic Wound/Ulcer of the Lower Extremity Etiology: Wound Location: Left, Posterior Calcaneus Wound Open Wounding Event: Blister Status: Date Acquired: 01/20/2023 Comorbid Arrhythmia, Coronary Artery Disease, Hypertension, Peripheral Weeks Of Treatment: 12 History: Venous Disease, Type II Diabetes, Osteoarthritis, Neuropathy Clustered Wound: No Photos WLLIAM, [name] [name] ([id number]) [id number] Page 8 of 9 Wound Measurements Length: (cm) 1 Width: (cm) 0.8 Depth: (cm) 0.5 Area: (cm) 0.628 Volume: (cm) 0.314 % Reduction in Area: 63% % Reduction in Volume: 7.4% Epithelialization: None Tunneling: No Undermining: No Wound Description Classification: Grade 3 Wound Margin: Distinct, outline attached Exudate Amount: Medium Exudate Type: Serosanguineous Exudate Color: red, brown Foul Odor After Cleansing: No Slough/Fibrino Yes Wound Bed Granulation Amount: Medium (34-66%) Exposed Structure Granulation Quality: Red, Pink Fascia Exposed: No Necrotic Amount: Medium (34-66%) Fat Layer (Subcutaneous Tissue) Exposed: Yes Necrotic Quality: Adherent Slough Tendon Exposed: No Muscle Exposed: No Joint Exposed: No Bone Exposed: No Periwound Skin Texture Texture Color No Abnormalities Noted: Yes No Abnormalities Noted: Yes Moisture Temperature / Pain No Abnormalities Noted: Yes Temperature: No Abnormality Tenderness on Palpation: Yes Treatment Notes Wound #1 (Calcaneus) Wound Laterality: Left, Posterior Cleanser Wound Cleanser Discharge Instruction: Cleanse the wound with wound cleanser prior to applying Billy clean dressing using gauze sponges, not tissue or cotton balls. Billy-Wound Care Skin Prep Discharge Instruction: Use skin prep as  directed Topical Primary Dressing Endoform 2x2 in Discharge Instruction: Moisten with saline Secondary Dressing Drawtex 4x4 in Discharge Instruction: Apply over primary dressing cut to fit inside wound Zetuvit Plus Silicone Border Dressing 3x3 (in/in) Discharge Instruction: Apply silicone border over primary dressing as directed. Secured With [employer] Compression Stockings Add-Ons [name] [name] [name] [name] ([id number]) [id number] Page 9 of 9 Electronic Signature(s) Signed: 05/21/2023 4:46:54 PM By: Merleen Handing RN, BSN Entered By: Merleen [name] on 05/21/2023 09:56:06 -------------------------------------------------------------------------------- Vitals Details Patient Name: Date of Service: Billy RNO [name] [name] [name] S. 05/21/2023 12:30 PM Medical Record Number: [id number] Patient Account Number: [id number] Date of Birth/Sex: Treating RN: 1946-02-04 (77 y.o. [name] [name] Primary Care Shresta Risden: Clinic, [name] Other Clinician: Referring Prentice Sackrider: Treating Jess Sulak/[name] [name]: [name], [name] Duos in Treatment: 12 Vital Signs Time Taken: 11:45 Temperature (F): 97.7 Height (in): 70 Pulse (bpm): 59 Weight (lbs): 196  Respiratory Rate (breaths/min): 18 Body Mass Index (BMI): 28.1 Blood Pressure (mmHg): 128/60 Capillary Blood Glucose (mg/dl): 753 Reference Range: 80 - 120 mg / dl Electronic Signature(s) Signed: 05/21/2023 12:44:18 PM By: Karolyn Sharper CHT EMT BS , , Previous Signature: 05/21/2023 12:44:01 PM Version By: Karolyn Sharper CHT EMT BS , , Entered By: Karolyn Sharper on 05/21/2023 09:44:18

## 2023-05-21 NOTE — Progress Notes (Signed)
 BERRY, GODSEY ([id number]) [id number] Page 1 of 2 Visit Report for 05/21/2023 Arrival Information Details Patient Name: Date of Service: [name], [name]. 05/21/2023 10:00 A M Medical Record Number: [id number] Patient Account Number: [id number] Date of Birth/Sex: Treating RN: 01-09-1946 (77 y.o. [name] Primary Care Agam Tuohy: Clinic, [name] Other Clinician: Karolyn Sharper Referring Tylynn Braniff: Treating Dede Dobesh/Extender: Marolyn Nest Clinic, [name] Duos in Treatment: 12 Visit Information History Since Last Visit All ordered tests and consults were completed: Yes Patient Arrived: [name] Added or deleted any medications: No Arrival Time: 09:14 Any new allergies or adverse reactions: No Accompanied By: spouse Had a fall or experienced change in No Transfer Assistance: None activities of daily living that may affect Patient Identification Verified: Yes risk of falls: Secondary Verification Process Completed: Yes Signs or symptoms of abuse/neglect since last visito No Patient Requires Transmission-Based Precautions: No Hospitalized since last visit: No Patient Has Alerts: No Implantable device outside of the clinic excluding No cellular tissue based products placed in the center since last visit: Pain Present Now: No Electronic Signature([name] Signed: 05/21/2023 1:37:41 PM By: Karolyn Sharper CHT EMT BS , , Entered By: Karolyn Sharper on 05/21/2023 10:37:41 -------------------------------------------------------------------------------- Encounter Discharge Information Details Patient Name: Date of Service: [name], [name]. 05/21/2023 10:00 A M Medical Record Number: [id number] Patient Account Number: [id number] Date of Birth/Sex: Treating RN: 1946-03-16 (77 y.o. [name] Primary Care Annaly Skop: Clinic, [name] Other Clinician: Karolyn Sharper Referring Shania Bjelland: Treating Irven Ingalsbe/Extender:  Marolyn Nest Clinic, [name] Duos in Treatment: 12 Encounter Discharge Information Items Discharge Condition: Stable Ambulatory Status: Ambulatory Discharge Destination: Home Transportation: Private Auto Accompanied By: spouse Schedule Follow-up Appointment: No Clinical Summary of Care: Electronic Signature([name] Signed: 05/21/2023 1:45:16 PM By: Karolyn Sharper CHT EMT BS , , Entered By: Karolyn Sharper on 05/21/2023 10:45:16 [name] ([id number]) [id number] Page 2 of 2 -------------------------------------------------------------------------------- Vitals Details Patient Name: Date of Service: [name], [name]. 05/21/2023 10:00 A M Medical Record Number: [id number] Patient Account Number: [id number] Date of Birth/Sex: Treating RN: 09-Oct-1945 (77 y.o. [name] Primary Care Daryl Quiros: Clinic, [name] Other Clinician: Karolyn Sharper Referring Yukio Bisping: Treating Anjeanette Petzold/Extender: Marolyn Nest Clinic, [name] Duos in Treatment: 12 Vital Signs Time Taken: 09:26 Temperature (F): 98.3 Height (in): 70 Pulse (bpm): 65 Weight (lbs): 196 Respiratory Rate (breaths/min): 18 Body Mass Index (BMI): 28.1 Blood Pressure (mmHg): 139/57 Capillary Blood Glucose (mg/dl): 765 Reference Range: 80 - 120 mg / dl Electronic Signature([name] Signed: 05/21/2023 1:38:06 PM By: Karolyn Sharper CHT EMT BS , , Entered By: Karolyn Sharper on 05/21/2023 10:38:06

## 2023-05-21 NOTE — Progress Notes (Addendum)
 [name], [name] ([id number]) [id number] Page 1 of 2 Visit Report for 05/21/2023 HBO Details Patient Name: Date of Service: [name], [name] 05/21/2023 10:00 A M Medical Record Number: [id number] Patient Account Number: [id number] Date of Birth/Sex: Treating RN: [birth year] (77 y.o. [name] Primary Care Tarence Searcy: [name] Other Clinician: Karolyn Sharper Referring Laiden Milles: Treating Taralee Marcus/Extender: Marolyn Nest [name] Duos in Treatment: 12 HBO Treatment Course Details Treatment Course Number: 1 Ordering Elisabella Hacker: Rufus Sharper T Treatments Ordered: otal 40 HBO Treatment Start Date: 03/11/2023 HBO Indication: Diabetic Ulcer(s) of the Lower Extremity Standard/Conservative Wound Care tried and failed greater than or equal to 30 days Wound #1 Left, Posterior Calcaneus , Wound #2 Right, Posterior Calcaneus HBO Treatment Details Treatment Number: 32 Patient Type: Outpatient Chamber Type: Monoplace Chamber Serial #: [id number] Treatment Protocol: 2.0 ATA with 90 minutes oxygen , and no air breaks Treatment Details Compression Rate Down: 1.0 psi / minute De-Compression Rate Up: 2.0 psi / minute Air breaks and breathing Decompress Decompress Compress Tx Pressure Begins Reached periods Begins Ends (leave unused spaces blank) Chamber Pressure (ATA 1 2 ------2 1 ) Clock Time (24 hr) 09:46 10:09 - - - - - - 11:29 11:38 Treatment Length: 112 (minutes) Treatment Segments: 4 Vital Signs Capillary Blood Glucose Reference Range: 80 - 120 mg / dl HBO Diabetic Blood Glucose Intervention Range: <131 mg/dl or >750 mg/dl Type: Time Vitals Blood Respiratory Capillary Blood Glucose Pulse Action Pulse: Temperature: Taken: Pressure: Rate: Glucose (mg/dl): Meter #: Oximetry (%) Taken: Pre 09:26 139/57 65 18 98.3 234 none per protocol Post 11:45 128/60 59 18 97.7 246 none per protocol Treatment Response Treatment  Toleration: Well Treatment Completion Status: Treatment Completed without Adverse Event Treatment Notes Mr. Friedland arrived with normal vital signs. He prepared for treatment. After performing a safety check, the patient was placed in the chamber which was compressed with 100% oxygen  at the rate of 1 psi/min. He tolerated the treatment and the subsequent decompression of the chamber at the rate of 1.5 psi/min. He denied any issues with ear equalization and/or pain related to barotrauma. His post-treatment vital signs were within normal range except heart rate at 59 bpm. Patient was asymptomatic for bradycardia. He was stable upon discharge with his wife. Additional Procedure Documentation Tissue [name] Physician HBO Attestation: I certify that I supervised this HBO treatment in accordance with Medicare guidelines. A trained emergency response team is readily available per [facility] hospital policies and procedures. Continue HBOT as ordered. [address] REVAN, GENDRON ([id number]) [id number] Page 2 of 2 Electronic Signature(s) Signed: 05/21/2023 3:06:18 PM By: Marolyn Nest MD FACS Previous Signature: 05/21/2023 1:42:56 PM Version By: Karolyn Sharper CHT EMT BS , , Entered By: Marolyn Nest on 05/21/2023 15:05:42 -------------------------------------------------------------------------------- HBO Safety Checklist Details Patient Name: Date of Service: [name] LD, [name] 05/21/2023 10:00 A M Medical Record Number: [id number] Patient Account Number: [id number] Date of Birth/Sex: Treating RN: [birth year] (77 y.o. [name] Primary Care Malavika Lira: [name] Other Clinician: Karolyn Sharper Referring Teruko Joswick: Treating Jakyia Gaccione/Extender: [name] in Treatment: 12 HBO Safety Checklist Items Safety Checklist Consent Form Signed Patient voided / foley secured and emptied When did you last eato  0830 Last dose of injectable or oral agent 0700 - BEC Biscuit, HB, OJ Ostomy pouch emptied and vented if applicable NA All implantable devices assessed, documented and approved NA Intravenous access site secured and place NA Valuables secured Linens and cotton and cotton/polyester blend (less  than 51% polyester) Personal oil-based products / skin lotions / body lotions removed Wigs or hairpieces removed Smoking or tobacco materials removed Books / newspapers / magazines / loose paper removed Cologne, aftershave, perfume and deodorant removed Jewelry removed (may wrap wedding band) Make-up removed NA Hair care products removed Battery operated devices (external) removed Heating patches and chemical warmers removed Titanium eyewear removed Nail polish cured greater than 10 hours NA Casting material cured greater than 10 hours NA Hearing aids removed at home Loose dentures or partials removed dentures removed Prosthetics have been removed NA Patient demonstrates correct use of air break device (if applicable) Patient concerns have been addressed Patient grounding bracelet on and cord attached to chamber Specifics for Inpatients (complete in addition to above) Medication sheet sent with patient NA Intravenous medications needed or due during therapy sent with patient NA Drainage tubes (e.g. nasogastric tube or chest tube secured and vented) NA Endotracheal or Tracheotomy tube secured NA Cuff deflated of air and inflated with saline NA Airway suctioned NA Notes Paper version used prior to treatment start. Electronic Signature(s) Signed: 05/21/2023 1:40:54 PM By: Karolyn Sharper CHT EMT BS , , Entered By: Karolyn Sharper on 05/21/2023 13:40:54

## 2023-05-22 NOTE — Progress Notes (Signed)
 CHAYCE, ROBBINS ([id number]) [id number] Page 1 of 9 Visit Report for 05/21/2023 Chief Complaint Document Details Patient Name: Date of Service: [name], [name] 05/21/2023 12:30 PM Medical Record Number: [id number] Patient Account Number: [id number] Date of Birth/Sex: Treating RN: 02-Jun-1945 (78 y.o. M) Primary Care Provider: [name] Other Clinician: Referring Provider: Treating Provider/Extender: Marolyn Nest [name] in Treatment: 12 Information Obtained from: Patient Chief Complaint 02/26/2023. Patient is here for review of wounds on his bilateral heel tips Electronic Signature(s) Signed: 05/21/2023 3:02:08 PM By: Marolyn Nest MD FACS Entered By: Marolyn Nest on 05/21/2023 13:29:10 -------------------------------------------------------------------------------- Debridement Details Patient Name: Date of Service: DELENA [name] LD, [name] 05/21/2023 12:30 PM Medical Record Number: [id number] Patient Account Number: [id number] Date of Birth/Sex: Treating RN: 10/09/1945 (78 y.o. [name] Primary Care Provider: [name] Other Clinician: Referring Provider: Treating Provider/Extender: [name] in Treatment: 12 Debridement Performed for Assessment: Wound #1 Left,Posterior Calcaneus Performed By: Physician Marolyn Nest, MD The following information was scribed by: Merleen Handing The information was scribed for: Marolyn Nest Debridement Type: Debridement Severity of Tissue Pre Debridement: Fat layer exposed Level of Consciousness (Pre-procedure): Awake and Alert Pre-procedure Verification/Time Out Yes - 13:05 Taken: Start Time: 13:07 Pain Control: Lidocaine  4% T opical Solution Percent of Wound Bed Debrided: 100% T Area Debrided (cm): otal 0.63 Tissue and other material debrided: Viable, Non-Viable, Eschar, Slough, Subcutaneous, Skin: Epidermis,  Slough Level: Skin/Subcutaneous Tissue Debridement Description: Excisional Instrument: Curette Bleeding: Minimum Hemostasis Achieved: Pressure Procedural Pain: 5 Post Procedural Pain: 3 Response to Treatment: Procedure was tolerated well Level of Consciousness (Post- Awake and Alert procedure): Post Debridement Measurements of Total Wound Length: (cm) 1 Width: (cm) 0.8 Depth: (cm) 0.5 Volume: (cm) 0.314 [name] ([id number]) [id number] Page 2 of 9 Character of Wound/Ulcer Post Debridement: Stable Severity of Tissue Post Debridement: Fat layer exposed Post Procedure Diagnosis Same as Pre-procedure Electronic Signature(s) Signed: 05/21/2023 3:02:08 PM By: Marolyn Nest MD FACS Signed: 05/21/2023 4:46:54 PM By: Merleen Handing RN, BSN Entered By: Merleen Handing on 05/21/2023 13:10:13 -------------------------------------------------------------------------------- HPI Details Patient Name: Date of Service: [name], [name] 05/21/2023 12:30 PM Medical Record Number: [id number] Patient Account Number: [id number] Date of Birth/Sex: Treating RN: 04-29-1946 (78 y.o. M) Primary Care Provider: [name] Other Clinician: Referring Provider: Treating Provider/Extender: [facility]olyn [name] in Treatment: 12 History of Present Illness HPI Description: ADMISSION 02/26/2023 Patient is a 78 year old man with type 2 diabetes. He has had a 12-month history of wounds on his bilateral heels just above the heel tips. They have been using Iodoflex/Iodosorb ointment. The patient has a history of PAD and has a SFA stent on the right. He also also status post left iliac stent. Furthermore he has a history of carotid artery stenosis status post right carotid endarterectomy and a left subclavian artery stenosis. His wound started on his blisters on the heel that of open to wounds with minimal but some depth right greater than  left. He also has advanced diabetic neuropathy. The patient is cared for through the [facility] and has been referred here for hyperbaric oxygen  treatment through his vascular surgeon. Past medical history includes hypertension, hyperlipidemia, type 2 diabetes chronic kidney disease stage IIIb, carotid artery stenosis status post right carotid endarterectomy, left subclavian artery stenosis ABIs done in our clinic were 1.11 on the right and 0.75 on the left these were remarkably similar to what was quoted during the vascular surgery appointment from  02/05/2023. 10/14; this is a patient with [place] 2 diabetic foot ulcers on both heels just above the heel tips. He was referred for hyperbaric oxygen  by the [facility]. His chest x-ray I think shows vascular crowding but no pneumothorax. He has been using Iodoflex on the wound with reasonably good improvement this week. He sees his ENT in 2 days and then they will make a decision about when they want to start HBO 10/24; patient is started HBO I believe he is on treatment 5 today. He had some problems with his right ear on the second dive however he has had no problems since and says he feels well. He slept through treatment yesterday. He has been using Iodosorb ointment on the wounds on both heels for a long period even before he came here. Unfortunately the wound surface still does not look completely as viable as I would like. 11/5; he continues to tolerate HBO fairly well although he is having some fullness in his left ear that is reversed by popping his left ear. He has been using Sorbact hydrogel. He comes in today with the wounds quite a bit smaller especially on the right but also on the left. He is fairly rigorous and offloading these with heel offloading shoes. 11/11; right posterior calcaneus is closed down. The area on the left calcaneus laterally still has some depth. We have been using Sorbact on both sides. He has completed 12 out of 40 treatments 11/19;  right posterior calcaneus is still closed the area on the left is quite a bit smaller. We have been using Sorbact on the left heel. He is still receiving HBO which was requested by the [facility]. These wounds may be healed soon 12/3; the only remaining wound is the left posterior calcaneus and it is smaller. We've been using sore act. He is continuing HBO. On presentation he had a similar wound on the right posterior heel which is healed two or three weeks ago. 12/10; left posterior lateral calcaneus. Unfortunately no improvement today. Using Sorbact and hydrogel her intake nurses saying that the Sorbact was not adherent to the wound. He is continuing in HBO 12/17; left posterior lateral calcaneus. Small punched-out area remains. Unfortunately cannot continue HBO for the foreseeable future his wife had a fall with multiple rib fractures and had to be admitted the hospital. We are using endoform to this wound. The right heel heel sometime ago The patient is complaining of some pain in the left foot. No predictable claudication however. He does have a history of PAD and has a left iliac artery stent. ABI in our clinic was 0.75 05/21/2023: He has a new blister on his right posterior heel. The blister is intact and the fluid is clear. He admits to being noncompliant with Prevalon boot use in bed and thinks he probably rubbed it on the sheets. The left heel ulcer has a fair amount of slough and some breakdown of the skin immediately adjacent to the 1 o'clock position. He has resumed hyperbaric oxygen  therapy. Electronic Signature(s) RICHARDS, PHERIGO ([id number]) [id number] Page 3 of 9 Signed: 05/21/2023 3:02:08 PM By: Marolyn Nest MD FACS Entered By: Marolyn Nest on 05/21/2023 13:30:47 -------------------------------------------------------------------------------- Physical Exam Details Patient Name: Date of Service: [name], [name] 05/21/2023 12:30 PM Medical Record  Number: [id number] Patient Account Number: [id number] Date of Birth/Sex: Treating RN: 19-Aug-1945 (78 y.o. M) Primary Care Provider: [name] Other Clinician: Referring Provider: Treating Provider/Extender: Marolyn [name] in Treatment: 12  Constitutional . Slightly bradycardic. . . no acute distress. Respiratory Normal work of breathing on room air.. Notes 05/21/2023: He has a new blister on his right posterior heel. The blister is intact and the fluid is clear. The left heel ulcer has a fair amount of slough and some breakdown of the skin immediately adjacent to the 1 o'clock position. Electronic Signature(s) Signed: 05/21/2023 3:02:08 PM By: Marolyn Nest MD FACS Entered By: Marolyn Nest on 05/21/2023 13:35:39 -------------------------------------------------------------------------------- Physician Orders Details Patient Name: Date of Service: [name], [name] 05/21/2023 12:30 PM Medical Record Number: [id number] Patient Account Number: [id number] Date of Birth/Sex: Treating RN: 03-Jul-1945 (78 y.o. 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 - Dr. Donalynn Eastern Return A after HBO Other: -  Once wounds close will discontinue hyperbarics. Anesthetic (In clinic) Topical Lidocaine  4% applied to wound bed - In clinic [address] ZAMIER, EGGEBRECHT ([id number]) [id number] Page 4 of 9 May shower and wash wound with soap and water. - with dressing changes Off-Loading Wound #1 Left,Posterior Calcaneus Wedge shoe to: - Off loading shoes bilaterally to alleviate pressure from heels. Turn and reposition every 2 hours Prevalon Boot - bilaterally at bedtime Non Wound Condition Right Lower Extremity pply the following to affected area as directed: - calcium alginate with silver to right heel blister and cover with gauze and a heel cup, change A every other day Hyperbaric Oxygen  Therapy Wound #1 Left,Posterior Calcaneus Evaluate for HBO Therapy - Per patient, he has claustrophobia. Evaluate for HBO Therapy - Per patient, he has claustrophobia. If appropriate for treatment, begin HBOT per protocol: 2.0 ATA for 90 Minutes without A Breaks ir 2.0 ATA for 90 Minutes without A Breaks ir Total Number of Treatments: - 40 One treatments per day (delivered Monday through Friday unless otherwise specified in Special Instructions below): One treatments per day (delivered Monday through Friday unless otherwise specified in Special Instructions below): Finger stick Blood Glucose Pre- and Post- HBOT Treatment. Finger stick Blood Glucose Pre- and Post- HBOT Treatment. Follow Hyperbaric Oxygen  Glycemia Protocol Follow Hyperbaric Oxygen  Glycemia Protocol Give two 4oz orange juices in addition to Glucerna when the glycemic protocol is used. Give two 4oz orange juices in addition to Glucerna when the glycemic protocol is used. A frin (Oxymetazoline HCL) 0.05% nasal spray - 1 spray in both nostrils daily as needed prior to HBO treatment for difficulty clearing ears Wound Treatment Wound #1 - Calcaneus Wound Laterality: Left, Posterior Cleanser: Wound Cleanser  Every Other Day/30 Days Discharge Instructions: Cleanse the wound with wound cleanser prior to applying a clean dressing using gauze sponges, not tissue or cotton balls. Peri-Wound Care: Skin Prep Every Other Day/30 Days Discharge Instructions: Use skin prep as directed Prim Dressing: Endoform 2x2 in Every Other Day/30 Days ary Discharge Instructions: Moisten with saline Secondary Dressing: Drawtex 4x4 in Every Other Day/30 Days Discharge Instructions: Apply over primary dressing  cut to fit inside wound Secondary Dressing: Zetuvit Plus Silicone Border Dressing 3x3 (in/in) Every Other Day/30 Days Discharge Instructions: Apply silicone border over primary dressing as directed. GLYCEMIA INTERVENTIONS PROTOCOL PRE-HBO GLYCEMIA INTERVENTIONS ACTION INTERVENTION Obtain pre-HBO capillary blood glucose (ensure 1 physician order is in chart). A. Notify HBO physician and await physician orders. 2 If result is 70 mg/dl or below: B. If the result meets the hospital definition of a critical result, follow hospital policy. A. Give patient an 8 ounce Glucerna Shake, an 8 ounce Ensure, or 8 ounces of a Glucerna/Ensure equivalent dietary supplement*. B. Wait 30 minutes. If result is 71 mg/dl to 869 mg/dl: C. Retest patients capillary blood glucose (CBG). D. If result greater than or equal to 110 mg/dl, proceed with HBO. If result less than 110 mg/dl, notify HBO physician and consider holding HBO. If result is 131 mg/dl to 750 mg/dl: A. Proceed with HBO. A. Notify HBO physician and await physician orders. B. It is recommended to hold HBO and do If result is 250 mg/dl or greater: blood/urine ketone testing. C. If the result meets the hospital definition of a critical result, follow hospital policy. POST-HBO GLYCEMIA INTERVENTIONS ACTION INTERVENTION Obtain post HBO capillary blood glucose (ensure 1 physician order is in chart). A. Notify HBO physician and await physician orders. 2 If  result is 70 mg/dl or below: DILLAN, CANDELA ([id number]) [phone] Page 5 of 9 2 If result is 70 mg/dl or below: B. If the result meets the hospital definition of a critical result, follow hospital policy. A. Give patient an 8 ounce Glucerna Shake, an 8 ounce Ensure, or 8 ounces of a Glucerna/Ensure equivalent dietary supplement*. B. Wait 15 minutes for symptoms of If result is 71 mg/dl to 899 mg/dl: hypoglycemia (i.e. nervousness, anxiety, sweating, chills, clamminess, irritability, confusion, tachycardia or dizziness). C. If patient asymptomatic, discharge patient. If patient symptomatic, repeat capillary blood glucose (CBG) and notify HBO physician. If result is 101 mg/dl to 750 mg/dl: A. Discharge patient. A. Notify HBO physician and await physician orders. B. It is recommended to do blood/urine ketone If result is 250 mg/dl or greater: testing. C. If the result meets the hospital definition of a critical result, follow hospital policy. *Juice or candies are NOT equivalent products. If patient refuses the Glucerna or Ensure, please consult the hospital dietitian for an appropriate substitute. Electronic Signature(s) Signed: 05/21/2023 3:02:08 PM By: Marolyn Nest MD FACS Entered By: Marolyn Nest on 05/21/2023 13:36:00 -------------------------------------------------------------------------------- Problem List Details Patient Name: Date of Service: [name], [name] 05/21/2023 12:30 PM Medical Record Number: [id number] Patient Account Number: [id number] Date of Birth/Sex: Treating RN: March 12, 1946 (78 y.o. [name] Primary Care Provider: [facility] Other Clinician: Referring Provider: Treating Provider/Extender: [facility]olyn [name] Treatment: 12 Active Problems ICD-10 Encounter Code Description Active Date MDM Diagnosis L97.524 Non-pressure chronic ulcer of other part of left foot  with necrosis of bone 05/07/2023 No Yes E11.51 Type 2 diabetes mellitus with diabetic peripheral angiopathy without gangrene 02/26/2023 No Yes E11.621 Type 2 diabetes mellitus with foot ulcer 02/26/2023 No Yes E11.42 Type 2 diabetes mellitus with diabetic polyneuropathy 02/26/2023 No Yes Inactive Problems ICD-10 Code Description Active Date Inactive Date 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 02/26/2023 02/26/2023 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 02/26/2023 02/26/2023 L97.521 Non-pressure chronic ulcer of other part of left foot limited to breakdown of skin 05/07/2023 05/07/2023 Schaffer,  BARTOSZ LUGINBILL ([id number]) [id number] Page 6 of 9 Resolved Problems Electronic Signature(s) Signed: 05/21/2023 3:02:08 PM By: Marolyn Nest MD FACS Entered By: Marolyn Nest on 05/21/2023 13:25:33 -------------------------------------------------------------------------------- Progress Note Details Patient Name: Date of Service: [name], [name] 05/21/2023 12:30 PM Medical Record Number: [id number] Patient Account Number: [id number] Date of Birth/Sex: Treating RN: [birth year] (78 y.o. M) Primary Care Provider: [name] Other Clinician: Referring Provider: Treating Provider/Extender: Marolyn [name] in Treatment: 12 Subjective Chief Complaint Information obtained from Patient 02/26/2023. Patient is here for review of wounds on his bilateral heel tips History of Present Illness (HPI) ADMISSION 02/26/2023 Patient is a 78 year old man with type 2 diabetes. He has had a 69-month history of wounds on his bilateral heels just above the heel tips. They have been using Iodoflex/Iodosorb ointment. The patient has a history of PAD and has a SFA stent on the right. He also also status post left iliac stent. Furthermore he has a history of  carotid artery stenosis status post right carotid endarterectomy and a left subclavian artery stenosis. His wound started on his blisters on the heel that of open to wounds with minimal but some depth right greater than left. He also has advanced diabetic neuropathy. The patient is cared for through the [facility] and has been referred here for hyperbaric oxygen  treatment through his vascular surgeon. Past medical history includes hypertension, hyperlipidemia, type 2 diabetes chronic kidney disease stage IIIb, carotid artery stenosis status post right carotid endarterectomy, left subclavian artery stenosis ABIs done in our clinic were 1.11 on the right and 0.75 on the left these were remarkably similar to what was quoted during the vascular surgery appointment from 02/05/2023. 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 He was referred for hyperbaric oxygen  by the [facility]. His chest x-ray I think shows vascular crowding but no pneumothorax. He has been using Iodoflex on the wound with reasonably good improvement this week. He sees his ENT in 2 days and then they will make a decision about when they want to start HBO 10/24; patient is started HBO I believe he is on treatment 5 today. He had some problems with his right ear on the second dive however he has had no problems since and says he feels well. He slept through treatment yesterday. He has been using Iodosorb ointment on the wounds on both heels for a long period even before he came here. Unfortunately the wound surface still does not look completely as viable as I would like. 11/5; he continues to tolerate HBO fairly well although he is having some fullness in his left ear that is reversed by popping his left ear. He has been using Sorbact hydrogel. He comes in today with the wounds quite a bit smaller especially on the right but also on the left. He is fairly rigorous and offloading these with heel  offloading shoes. 11/11; right posterior calcaneus is closed down. The area on the left calcaneus laterally still has some depth. We have been using Sorbact on both sides. He has completed 12 out of 40 treatments 11/19; right posterior calcaneus is still closed the area on the left is quite a bit smaller. We have been using Sorbact on the left heel. He is still receiving HBO which was requested by the [facility]. These wounds may be healed soon 12/3; the only remaining wound is the left posterior calcaneus and it is smaller. We've been  using sore act. He is continuing HBO. On presentation he had a similar wound on the right posterior heel which is healed two or three weeks ago. 12/10; left posterior lateral calcaneus. Unfortunately no improvement today. Using Sorbact and hydrogel her intake nurses saying that the Sorbact was not adherent to the wound. He is continuing in HBO 12/17; left posterior lateral calcaneus. Small punched-out area remains. Unfortunately cannot continue HBO for the foreseeable future his wife had a fall with multiple rib fractures and had to be admitted the hospital. We are using endoform to this wound. The right heel heel sometime ago The patient is complaining of some pain in the left foot. No predictable claudication however. He does have a history of PAD and has a left iliac artery stent. ABI in our clinic was 0.75 05/21/2023: He has a new blister on his right posterior heel. The blister is intact and the fluid is clear. He admits to being noncompliant with Prevalon boot use in bed and thinks he probably rubbed it on the sheets. The left heel ulcer has a fair amount of slough and some breakdown of the skin immediately adjacent to the 1 o'clock position. He has resumed hyperbaric oxygen  therapy. STEEN, BISIG ([id number]) [id number] Page 7 of 9 Objective Constitutional Slightly bradycardic. no acute distress. Vitals Time Taken: 11:45 AM, Height: 70  in, Weight: 196 lbs, BMI: 28.1, Temperature: 97.7 F, Pulse: 59 bpm, Respiratory Rate: 18 breaths/min, Blood Pressure: 128/60 mmHg, Capillary Blood Glucose: 246 mg/dl. Respiratory Normal work of breathing on room air.. General Notes: 05/21/2023: He has a new blister on his right posterior heel. The blister is intact and the fluid is clear. The left heel ulcer has a fair amount of slough and some breakdown of the skin immediately adjacent to the 1 o'clock position. Integumentary (Hair, Skin) Wound #1 status is Open. Original cause of wound was Blister. The date acquired was: 01/20/2023. The wound has been in treatment 12 weeks. The wound is located on the Left,Posterior Calcaneus. The wound measures 1cm length x 0.8cm width x 0.5cm depth; 0.628cm^2 area and 0.314cm^3 volume. There is Fat Layer (Subcutaneous Tissue) exposed. There is no tunneling or undermining noted. There is a medium amount of serosanguineous drainage noted. The wound margin is distinct with the outline attached to the wound base. There is medium (34-66%) red, pink granulation within the wound bed. There is a medium (34-66%) amount of necrotic tissue within the wound bed including Adherent Slough. The periwound skin appearance had no abnormalities noted for texture. The periwound skin appearance had no abnormalities noted for moisture. The periwound skin appearance had no abnormalities noted for color. Periwound temperature was noted as No Abnormality. The periwound has tenderness on palpation. Assessment Active Problems ICD-10 Non-pressure chronic ulcer of other part of left foot with necrosis of bone Type 2 diabetes mellitus with diabetic peripheral angiopathy without gangrene Type 2 diabetes mellitus with foot ulcer Type 2 diabetes mellitus with diabetic polyneuropathy Procedures Wound #1 Pre-procedure diagnosis of Wound #1 is a Diabetic Wound/Ulcer of the Lower Extremity located on the Left,Posterior Calcaneus .Severity of  Tissue Pre Debridement is: Fat layer exposed. There was a Excisional Skin/Subcutaneous Tissue Debridement with a total area of 0.63 sq cm performed by Marolyn Nest, MD. With the following instrument(s): Curette to remove Viable and Non-Viable tissue/material. Material removed includes Eschar, Subcutaneous Tissue, Slough, and Skin: Epidermis after achieving pain control using Lidocaine  4% Topical Solution. No specimens were taken. A time out was conducted at 13:05,  prior to the start of the procedure. A Minimum amount of bleeding was controlled with Pressure. The procedure was tolerated well with a pain level of 5 throughout and a pain level of 3 following the procedure. Post Debridement Measurements: 1cm length x 0.8cm width x 0.5cm depth; 0.314cm^3 volume. Character of Wound/Ulcer Post Debridement is stable. Severity of Tissue Post Debridement is: Fat layer exposed. Post procedure Diagnosis Wound #1: Same as Pre-Procedure Plan Follow-up Appointments: Return Appointment in 1 week. - Dr. Donalynn Eastern after HBO Other: - Once wounds close will discontinue hyperbarics. Anesthetic: (In clinic) Topical Lidocaine  4% applied to wound bed - In clinic Bathing/ Shower/ Hygiene: May shower and wash wound with soap and water. - with dressing changes Off-Loading: Wound #1 Left,Posterior Calcaneus: Wedge shoe to: - Off loading shoes bilaterally to alleviate pressure from heels. Turn and reposition every 2 hours Prevalon Boot - bilaterally at bedtime Non Wound Condition: Apply the following to affected area as directed: - calcium alginate with silver to right heel blister and cover with gauze and a heel cup, change every other day Hyperbaric Oxygen  Therapy: Wound #1 Left,Posterior Calcaneus: Evaluate for HBO Therapy - Per patient, he has claustrophobia. KAIROS, PANETTA ([id number]) [id number] Page 8 of 9 Evaluate for HBO Therapy - Per patient, he has  claustrophobia. If appropriate for treatment, begin HBOT per protocol: 2.0 ATA for 90 Minutes without Air Breaks 2.0 ATA for 90 Minutes without Air Breaks T Number of Treatments: - 40 otal One treatments per day (delivered Monday through Friday unless otherwise specified in Special Instructions below): One treatments per day (delivered Monday through Friday unless otherwise specified in Special Instructions below): Finger stick Blood Glucose Pre- and Post- HBOT Treatment. Finger stick Blood Glucose Pre- and Post- HBOT Treatment. Follow Hyperbaric Oxygen  Glycemia Protocol Follow Hyperbaric Oxygen  Glycemia Protocol Give two 4oz orange juices in addition to Glucerna when the glycemic protocol is used. Give two 4oz orange juices in addition to Glucerna when the glycemic protocol is used. Afrin (Oxymetazoline HCL) 0.05% nasal spray - 1 spray in both nostrils daily as needed prior to HBO treatment for difficulty clearing ears WOUND #1: - Calcaneus Wound Laterality: Left, Posterior Cleanser: Wound Cleanser Every Other Day/30 Days Discharge Instructions: Cleanse the wound with wound cleanser prior to applying a clean dressing using gauze sponges, not tissue or cotton balls. Peri-Wound Care: Skin Prep Every Other Day/30 Days Discharge Instructions: Use skin prep as directed Prim Dressing: Endoform 2x2 in Every Other Day/30 Days ary Discharge Instructions: Moisten with saline Secondary Dressing: Drawtex 4x4 in Every Other Day/30 Days Discharge Instructions: Apply over primary dressing cut to fit inside wound Secondary Dressing: Zetuvit Plus Silicone Border Dressing 3x3 (in/in) Every Other Day/30 Days Discharge Instructions: Apply silicone border over primary dressing as directed. 05/21/2023: He has a new blister on his right posterior heel. The blister is intact and the fluid is clear. He admits to being noncompliant with Prevalon boot use in bed and thinks he probably rubbed it on the sheets. The  left heel ulcer has a fair amount of slough and some breakdown of the skin immediately adjacent to the 1 o'clock position. He has resumed hyperbaric oxygen  therapy. I used a curette to debride slough, skin, and subcutaneous tissue from the wound. The wound measured a little bit larger after my debridement due to the removal of the nonviable skin adjacent. I left the blister intact. We will continue endoform with drawtex on the left heel and apply some silver alginate over  the right heel to hopefully dry things out without creating the wound. He should continue to wear heel cups and to be much more diligent about using his Prevalon boots while in bed. He will continue hyperbaric oxygen  therapy. He has apparently completed 32 out of 40 prescribed treatments. He will follow-up with Dr. Rosan next week. Electronic Signature(s) Signed: 05/21/2023 3:02:08 PM By: Marolyn Nest MD FACS Entered By: Marolyn Nest on 05/21/2023 13:37:08 -------------------------------------------------------------------------------- SuperBill Details Patient Name: Date of Service: [name], [name] 05/21/2023 Medical Record Number: [id number] Patient Account Number: [id number] Date of Birth/Sex: Treating RN: 08-05-1945 (78 y.o. 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 E11.621 Type 2 diabetes mellitus with foot ulcer E11.42 Type 2 diabetes mellitus with diabetic polyneuropathy Facility Procedures : QUANTE, PETTRY Code: [id number] KEYONTE COOKSTON ([id number] Description: 11042 - DEB SUBQ TISSUE 20 SQ CM/< ICD-10 Diagnosis Description L97.524 Non-pressure chronic ulcer of  other part of left foot with necrosis of bone E11.621 Type 2 diabetes mellitus with foot ulcer E11.51 Type 2 diabetes mellitus with  diabetic peripheral 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 ) [phone] Modifier: [id number] Quantity: 1 Page 9 of 9 Physician Procedures : CPT4 Code Description Modifier [id number] 11042 - WC PHYS SUBQ TISS 20 SQ CM ICD-10 Diagnosis Description L97.524 Non-pressure chronic ulcer of other part of left foot with necrosis of bone E11.621 Type 2 diabetes mellitus with foot ulcer E11.51 Type 2  diabetes mellitus with diabetic peripheral 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 Quantity: 1 Electronic Signature(s) Signed: 05/21/2023 3:02:08 PM By: Marolyn Nest MD FACS Entered By: Marolyn Nest on 05/21/2023 13:37:42

## 2023-05-22 NOTE — Progress Notes (Signed)
 SAFWAN, TOMEI ([id number]) [id number] Page 1 of 1 Visit Report for 05/21/2023 SuperBill Details Patient Name: Date of Service: [name], [name] 05/21/2023 Medical Record Number: [id number] Patient Account Number: [id number] Date of Birth/Sex: Treating RN: [birth year] (78 y.o. 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

## 2023-05-23 ENCOUNTER — Encounter (HOSPITAL_BASED_OUTPATIENT_CLINIC_OR_DEPARTMENT_OTHER): Payer: No Typology Code available for payment source | Attending: Internal Medicine | Admitting: Internal Medicine

## 2023-05-23 DIAGNOSIS — E1122 Type 2 diabetes mellitus with diabetic chronic kidney disease: Secondary | ICD-10-CM | POA: Insufficient documentation

## 2023-05-23 DIAGNOSIS — L97524 Non-pressure chronic ulcer of other part of left foot with necrosis of bone: Secondary | ICD-10-CM | POA: Diagnosis not present

## 2023-05-23 DIAGNOSIS — E11621 Type 2 diabetes mellitus with foot ulcer: Secondary | ICD-10-CM | POA: Diagnosis present

## 2023-05-23 DIAGNOSIS — I771 Stricture of artery: Secondary | ICD-10-CM | POA: Diagnosis not present

## 2023-05-23 DIAGNOSIS — N1832 Chronic kidney disease, stage 3b: Secondary | ICD-10-CM | POA: Insufficient documentation

## 2023-05-23 DIAGNOSIS — M1990 Unspecified osteoarthritis, unspecified site: Secondary | ICD-10-CM | POA: Insufficient documentation

## 2023-05-23 DIAGNOSIS — E1151 Type 2 diabetes mellitus with diabetic peripheral angiopathy without gangrene: Secondary | ICD-10-CM | POA: Diagnosis not present

## 2023-05-23 DIAGNOSIS — I129 Hypertensive chronic kidney disease with stage 1 through stage 4 chronic kidney disease, or unspecified chronic kidney disease: Secondary | ICD-10-CM | POA: Diagnosis not present

## 2023-05-23 DIAGNOSIS — E785 Hyperlipidemia, unspecified: Secondary | ICD-10-CM | POA: Diagnosis not present

## 2023-05-23 DIAGNOSIS — E1142 Type 2 diabetes mellitus with diabetic polyneuropathy: Secondary | ICD-10-CM | POA: Diagnosis not present

## 2023-05-23 LAB — GLUCOSE, CAPILLARY
Glucose-Capillary: 173 mg/dL — ABNORMAL HIGH (ref 70–99)
Glucose-Capillary: 167 mg/dL — ABNORMAL HIGH (ref 70–99)

## 2023-05-23 NOTE — Progress Notes (Signed)
 [name], [name] ([id number]) [id number] Page 1 of 1 Visit Report for 05/23/2023 SuperBill Details Patient Name: Date of Service: [name], [name]. 05/23/2023 Medical Record Number: [id number] Patient Account Number: [id number] Date of Birth/Sex: Treating RN: [birth year] (78 y.o. 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 E11.621 Type 2 diabetes mellitus with foot ulcer E11.42 Type 2 diabetes mellitus with diabetic polyneuropathy Facility Procedures CPT4 Code Description Modifier Quantity [id number] G0277-(Facility Use Only) HBOT full body chamber, , 4 ICD-10 Diagnosis Description E11.621 Type 2 diabetes mellitus with foot ulcer L97.524 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

## 2023-05-23 NOTE — Progress Notes (Addendum)
 ISHMEL, ACEVEDO ([id number]) [id number] Page 1 of 2 Visit Report for 05/23/2023 HBO Details Patient Name: Date of Service: [name], [name] 05/23/2023 12:00 PM Medical Record Number: [id number] Patient Account Number: [id number] Date of Birth/Sex: Treating RN: 13-Jun-1945 (78 y.o. [name] Primary Care Coran Dipaola: [name] Other Clinician: Karolyn Sharper Referring Natasja Niday: Treating Geoffrey Hynes/Extender: Rufus Sharper [name] in Treatment: 12 HBO Treatment Course Details Treatment Course Number: 1 Ordering Obbie Lewallen: Rufus Sharper T Treatments Ordered: otal 40 HBO Treatment Start Date: 03/11/2023 HBO Indication: Diabetic Ulcer(s) of the Lower Extremity Standard/Conservative Wound Care tried and failed greater than or equal to 30 days Wound #1 Left, Posterior Calcaneus , Wound #2 Right, Posterior Calcaneus HBO Treatment Details Treatment Number: 33 Patient Type: Outpatient Chamber Type: Monoplace Chamber Serial #: [id number] Treatment Protocol: 2.0 ATA with 90 minutes oxygen , and no air breaks Treatment Details Compression Rate Down: 1.0 psi / minute De-Compression Rate Up: 1.5 psi / minute Air breaks and breathing Decompress Decompress Compress Tx Pressure Begins Reached periods Begins Ends (leave unused spaces blank) Chamber Pressure (ATA 1 2 ------2 1 ) Clock Time (24 hr) 12:23 12:38 - - - - - - 14:08 14:17 Treatment Length: 114 (minutes) Treatment Segments: 4 Vital Signs Capillary Blood Glucose Reference Range: 80 - 120 mg / dl HBO Diabetic Blood Glucose Intervention Range: <131 mg/dl or >750 mg/dl Type: Time Vitals Blood Pulse: Respiratory Temperature: Capillary Blood Glucose Pulse Action Taken: Pressure: Rate: Glucose (mg/dl): Meter #: Oximetry (%) Taken: Pre 12:02 109/59 74 18 97.9 173 asymptomatic for hypotension Post 14:20 152/63 62 18 97.9 167 none per protocol Treatment Response Treatment  Toleration: Well Treatment Completion Status: Treatment Completed without Adverse Event Treatment Notes Mr. Hannold arrived with diastolic BP of 59 mmHg. Other vital signs were within normal range. Patient was asymptomatic for hypotension. He prepared for treatment. After performing a safety check, the patient was placed in the chamber which was compressed with 100% oxygen  at the rate of 1 psi/min. He tolerated the treatment and the subsequent decompression of the chamber at the rate of 1.5 psi/min. He denied any issues with ear equalization and/or pain related to barotrauma. His post-treatment vital signs were within normal range. He was stable upon discharge with his wife. Additional Procedure Documentation Tissue [name] Rubyann Lingle Notes no concerns with treatment given Electronic Signature(s) Signed: 05/23/2023 4:03:36 PM By: Rufus Sharper MD [place], Signed: 05/23/2023 4:03:36 PM By: Rufus Sharper MD LAMAR RAMAN ([id number]) [id number] Page 2 of 2 Previous Signature: 05/23/2023 2:52:29 PM Version By: Karolyn Sharper CHT EMT BS , , Entered By: Rufus Sharper on 05/23/2023 15:32:42 -------------------------------------------------------------------------------- HBO Safety Checklist Details Patient Name: Date of Service: [name], [name] 05/23/2023 12:00 PM Medical Record Number: [id number] Patient Account Number: [id number] Date of Birth/Sex: Treating RN: 31-Oct-1945 (78 y.o. [name] Primary Care Jadzia Ibsen: [name] Other Clinician: Karolyn Sharper Referring Abrahan Fulmore: Treating Braxton Weisbecker/Extender: Rufus Sharper [name] in Treatment: 12 HBO Safety Checklist Items Safety Checklist Consent Form Signed Patient voided / foley secured and emptied When did you last eato 1100 - 2 eggs, bacon, grits, bread Last dose of injectable or oral agent 0800 - Metformin Ostomy pouch emptied and vented if  applicable NA All implantable devices assessed, documented and approved NA Intravenous access site secured and place NA Valuables secured Linens and cotton and cotton/polyester blend (less than 51% polyester) Personal oil-based products / skin lotions / body lotions removed Wigs or hairpieces removed  NA Smoking or tobacco materials removed Books / newspapers / magazines / loose paper removed Cologne, aftershave, perfume and deodorant removed Jewelry removed (may wrap wedding band) Make-up removed NA Hair care products removed Battery operated devices (external) removed Heating patches and chemical warmers removed Titanium eyewear removed Nail polish cured greater than 10 hours NA Casting material cured greater than 10 hours Hearing aids removed at home Loose dentures or partials removed dentures removed Prosthetics have been removed NA Patient demonstrates correct use of air break device (if applicable) Patient concerns have been addressed Patient grounding bracelet on and cord attached to chamber Specifics for Inpatients (complete in addition to above) Medication sheet sent with patient NA Intravenous medications needed or due during therapy sent with patient NA Drainage tubes (e.g. nasogastric tube or chest tube secured and vented) NA Endotracheal or Tracheotomy tube secured NA Cuff deflated of air and inflated with saline NA Airway suctioned NA Notes Paper version used prior to treatment start. Electronic Signature(s) Signed: 05/23/2023 2:06:40 PM By: Karolyn Sharper CHT EMT BS , , Entered By: Karolyn Sharper on 05/23/2023 14:06:40

## 2023-05-23 NOTE — Progress Notes (Signed)
 STONEY, KARCZEWSKI ([id number]) [id number] Page 1 of 2 Visit Report for 05/23/2023 Arrival Information Details Patient Name: Date of Service: [name], [name] 05/23/2023 12:00 PM Medical Record Number: [id number] Patient Account Number: [id number] Date of Birth/Sex: Treating RN: 07/23/1945 (78 y.o. [name] Primary Care Yuriel Lopezmartinez: [name] Other Clinician: Karolyn Sharper Referring Kynsli Haapala: Treating Marsi Turvey/Extender: Rufus Sharper [name] in Treatment: 12 Visit Information History Since Last Visit All ordered tests and consults were completed: Yes Patient Arrived: [name] Added or deleted any medications: No Arrival Time: 11:50 Any new allergies or adverse reactions: No Accompanied By: spouse Had Billy fall or experienced change in No Transfer Assistance: None activities of daily living that may affect Patient Identification Verified: Yes risk of falls: Secondary Verification Process Completed: Yes Signs or symptoms of abuse/neglect since last visito No Patient Requires Transmission-Based Precautions: No Hospitalized since last visit: No Patient Has Alerts: No Implantable device outside of the clinic excluding No cellular tissue based products placed in the center since last visit: Pain Present Now: No Electronic Signature(s) Signed: 05/23/2023 2:03:42 PM By: Karolyn Sharper CHT EMT BS , , Entered By: Karolyn Sharper on 05/23/2023 14:03:41 -------------------------------------------------------------------------------- Encounter Discharge Information Details Patient Name: Date of Service: [name], [name] 05/23/2023 12:00 PM Medical Record Number: [id number] Patient Account Number: [id number] Date of Birth/Sex: Treating RN: 16-May-1946 (78 y.o. [name] Primary Care Isma Tietje: [name] Other Clinician: Karolyn Sharper Referring Bluma Buresh: Treating Sugey Trevathan/Extender: Rufus Sharper [name] in Treatment: 12 Encounter Discharge Information Items Discharge Condition: Stable Ambulatory Status: Walker Discharge Destination: Home Transportation: Private Auto Accompanied By: spouse Schedule Follow-up Appointment: No Clinical Summary of Care: Electronic Signature(s) Signed: 05/23/2023 2:55:00 PM By: Karolyn Sharper CHT EMT BS , , Entered By: Karolyn Sharper on 05/23/2023 14:55:00 [name] ([id number]) [id number] Page 2 of 2 -------------------------------------------------------------------------------- Vitals Details Patient Name: Date of Service: [name], [name] 05/23/2023 12:00 PM Medical Record Number: [id number] Patient Account Number: [id number] Date of Birth/Sex: Treating RN: Sep 16, 1945 (78 y.o. [name] Primary Care Talyn Dessert: [name] Other Clinician: Karolyn Sharper Referring TRUE Shackleford: Treating Bertine Schlottman/Extender: Rufus Sharper [name] in Treatment: 12 Vital Signs Time Taken: 12:02 Temperature (F): 97.9 Height (in): 70 Pulse (bpm): 74 Weight (lbs): 196 Respiratory Rate (breaths/min): 18 Body Mass Index (BMI): 28.1 Blood Pressure (mmHg): 109/59 Capillary Blood Glucose (mg/dl): 826 Reference Range: 80 - 120 mg / dl Electronic Signature(s) Signed: 05/23/2023 2:05:25 PM By: Karolyn Sharper CHT EMT BS , , Entered By: Karolyn Sharper on 05/23/2023 14:05:24

## 2023-05-24 ENCOUNTER — Encounter (HOSPITAL_BASED_OUTPATIENT_CLINIC_OR_DEPARTMENT_OTHER): Payer: No Typology Code available for payment source | Admitting: General Surgery

## 2023-05-27 ENCOUNTER — Encounter (HOSPITAL_BASED_OUTPATIENT_CLINIC_OR_DEPARTMENT_OTHER): Payer: No Typology Code available for payment source | Admitting: Internal Medicine

## 2023-05-27 DIAGNOSIS — E11621 Type 2 diabetes mellitus with foot ulcer: Secondary | ICD-10-CM | POA: Diagnosis not present

## 2023-05-27 DIAGNOSIS — E1142 Type 2 diabetes mellitus with diabetic polyneuropathy: Secondary | ICD-10-CM | POA: Diagnosis not present

## 2023-05-27 DIAGNOSIS — L97524 Non-pressure chronic ulcer of other part of left foot with necrosis of bone: Secondary | ICD-10-CM

## 2023-05-27 LAB — GLUCOSE, CAPILLARY
Glucose-Capillary: 222 mg/dL — ABNORMAL HIGH (ref 70–99)
Glucose-Capillary: 179 mg/dL — ABNORMAL HIGH (ref 70–99)

## 2023-05-27 NOTE — Progress Notes (Signed)
 TANAV, ORSAK ([id number]) [id number] Page 1 of 2 Visit Report for 05/27/2023 Arrival Information Details Patient Name: Date of Service: [name], [name] 05/27/2023 12:00 PM Medical Record Number: [id number] Patient Account Number: [id number] Date of Birth/Sex: Treating RN: [birth year] (78 y.o. [name] Primary Care Yasheka Fossett: [name] Other Clinician: Karolyn Sharper Referring Tyjae Shvartsman: Treating Lansing Sigmon/Extender: [name] in Treatment: 12 Visit Information History Since Last Visit All ordered tests and consults were completed: Yes Patient Arrived: [name] Added or deleted any medications: No Arrival Time: 11:44 Any new allergies or adverse reactions: No Accompanied By: spouse Had Billy fall or experienced change in No Transfer Assistance: None activities of daily living that may affect Patient Identification Verified: Yes risk of falls: Secondary Verification Process Completed: Yes Signs or symptoms of abuse/neglect since last visito No Patient Requires Transmission-Based Precautions: No Hospitalized since last visit: No Patient Has Alerts: No Implantable device outside of the clinic excluding No cellular tissue based products placed in the center since last visit: Pain Present Now: No Electronic Signature(s) Signed: 05/27/2023 4:09:32 PM By: Karolyn Sharper CHT EMT BS , , Entered By: Karolyn Sharper on 05/27/2023 13:09:32 -------------------------------------------------------------------------------- Encounter Discharge Information Details Patient Name: Date of Service: [name], [name] 05/27/2023 12:00 PM Medical Record Number: [id number] Patient Account Number: [id number] Date of Birth/Sex: Treating RN: Feb 18, 1946 (78 y.o. [name] Primary Care Blima Jaimes: [name] Other Clinician: Karolyn Sharper Referring Joran Kallal: Treating Kavita Bartl/Extender: [name] in Treatment: 12 Encounter Discharge Information Items Discharge Condition: Stable Ambulatory Status: Walker Discharge Destination: Home Transportation: Private Auto Accompanied By: spouse Schedule Follow-up Appointment: No Clinical Summary of Care: Electronic Signature(s) Signed: 05/27/2023 4:15:04 PM By: Karolyn Sharper CHT EMT BS , , Entered By: Karolyn Sharper on 05/27/2023 13:15:04 [name] ([id number]) [id number] Page 2 of 2 -------------------------------------------------------------------------------- Vitals Details Patient Name: Date of Service: [name], [name] 05/27/2023 12:00 PM Medical Record Number: [id number] Patient Account Number: [id number] Date of Birth/Sex: Treating RN: 01-26-1946 (78 y.o. [name] Primary Care Hildur Bayer: [name] Other Clinician: Karolyn Sharper Referring Lane Eland: Treating Assyria Morreale/Extender: [name] in Treatment: 12 Vital Signs Time Taken: 11:50 Temperature (F): 98.1 Height (in): 70 Pulse (bpm): 71 Weight (lbs): 196 Respiratory Rate (breaths/min): 18 Body Mass Index (BMI): 28.1 Blood Pressure (mmHg): 150/67 Capillary Blood Glucose (mg/dl): 777 Reference Range: 80 - 120 mg / dl Electronic Signature(s) Signed: 05/27/2023 4:09:54 PM By: Karolyn Sharper CHT EMT BS , , Entered By: Karolyn Sharper on 05/27/2023 13:09:54

## 2023-05-27 NOTE — Progress Notes (Addendum)
 [name] ([id number]) [id number] Page 1 of 2 Visit Report for 05/27/2023 HBO Details Patient Name: Date of Service: [name], [name] 05/27/2023 12:00 PM Medical Record Number: [id number] Patient Account Number: [id number] Date of Birth/Sex: Treating RN: 04-08-1946 (78 y.o. [name] Primary Care Ajanae Virag: [name] Other Clinician: Karolyn Sharper Referring Sejal Cofield: Treating Isaura Schiller/Extender: [name] [name] in Treatment: 12 HBO Treatment Course Details Treatment Course Number: 1 Ordering Denea Cheaney: Rufus Sharper T Treatments Ordered: otal 40 HBO Treatment Start Date: 03/11/2023 HBO Indication: Diabetic Ulcer(s) of the Lower Extremity Standard/Conservative Wound Care tried and failed greater than or equal to 30 days Wound #1 Left, Posterior Calcaneus , Wound #2 Right, Posterior Calcaneus HBO Treatment Details Treatment Number: 34 Patient Type: Outpatient Chamber Type: Monoplace Chamber Serial #: [id number] Treatment Protocol: 2.0 ATA with 90 minutes oxygen , and no air breaks Treatment Details Compression Rate Down: 1.0 psi / minute De-Compression Rate Up: 2.0 psi / minute Air breaks and breathing Decompress Decompress Compress Tx Pressure Begins Reached periods Begins Ends (leave unused spaces blank) Chamber Pressure (ATA 1 2 ------2 1 ) Clock Time (24 hr) 12:16 12:30 - - - - - - 14:00 14:11 Treatment Length: 115 (minutes) Treatment Segments: 4 Vital Signs Capillary Blood Glucose Reference Range: 80 - 120 mg / dl HBO Diabetic Blood Glucose Intervention Range: <131 mg/dl or >750 mg/dl Type: Time Vitals Blood Respiratory Capillary Blood Glucose Pulse Action Pulse: Temperature: Taken: Pressure: Rate: Glucose (mg/dl): Meter #: Oximetry (%) Taken: Pre 11:50 150/67 71 18 98.1 222 none per protocol Post 14:14 145/68 60 18 98.2 179 none per protocol Treatment Response Treatment  Toleration: Well Treatment Completion Status: Treatment Completed without Adverse Event Treatment Notes Mr. Phommachanh arrived with normal vital signs. He prepared for treatment. After performing a safety check, the patient was placed in the chamber which was compressed with 100% oxygen  at the rate of 1.5 psi/min. He tolerated the treatment and the subsequent decompression of the chamber at the rate of 1.5 psi/min. He denied any issues with ear equalization and/or pain related to barotrauma. His post-treatment vital signs were within normal range. He was stable upon discharge with his wife. Additional Procedure Documentation Tissue [name] Physician HBO Attestation: I certify that I supervised this HBO treatment in accordance with Medicare guidelines. A trained emergency response team is readily available per Yes hospital policies and procedures. Continue HBOT as ordered. [address] CAGE, GUPTON ([id number]) [id number] Page 2 of 2 Electronic Signature(s) Signed: 05/29/2023 5:26:35 PM By: [name] DO Previous Signature: 05/27/2023 4:13:48 PM Version By: Karolyn Sharper CHT EMT BS , , Entered By: [name] on 05/29/2023 17:25:06 -------------------------------------------------------------------------------- HBO Safety Checklist Details Patient Name: Date of Service: [name], [name] 05/27/2023 12:00 PM Medical Record Number: [id number] Patient Account Number: [id number] Date of Birth/Sex: Treating RN: [birth year] (78 y.o. [name] Primary Care [name]: [name] Other Clinician: Karolyn Sharper Referring [name]: Treating Tareva Leske/Extender: [name] in Treatment: 12 HBO Safety Checklist Items Safety Checklist Consent Form Signed Patient voided / foley secured and emptied 1015 - 3 egg, 2 bacon, HB, coffee 1/2 glass When did you last eato milk Last dose of injectable or oral  agent 0945 - Metformin Ostomy pouch emptied and vented if applicable NA All implantable devices assessed, documented and approved NA Intravenous access site secured and place NA Valuables secured Linens and cotton and cotton/polyester blend (less than 51% polyester) Personal oil-based products /  skin lotions / body lotions removed Wigs or hairpieces removed NA Smoking or tobacco materials removed NA Books / newspapers / magazines / loose paper removed Cologne, aftershave, perfume and deodorant removed Jewelry removed (may wrap wedding band) Make-up removed NA Hair care products removed Battery operated devices (external) removed Heating patches and chemical warmers removed Titanium eyewear removed NA Nail polish cured greater than 10 hours NA Casting material cured greater than 10 hours Hearing aids removed at home Loose dentures or partials removed dentures removed Prosthetics have been removed NA Patient demonstrates correct use of air break device (if applicable) Patient concerns have been addressed Patient grounding bracelet on and cord attached to chamber Specifics for Inpatients (complete in addition to above) Medication sheet sent with patient NA Intravenous medications needed or due during therapy sent with patient NA Drainage tubes (e.g. nasogastric tube or chest tube secured and vented) NA Endotracheal or Tracheotomy tube secured NA Cuff deflated of air and inflated with saline NA Airway suctioned NA Notes Paper version used prior to treatment start. Electronic Signature(s) Signed: 05/27/2023 4:11:24 PM By: Karolyn Sharper CHT EMT BS , , Entered By: Karolyn Sharper on 05/27/2023 16:11:23

## 2023-05-28 ENCOUNTER — Encounter (HOSPITAL_BASED_OUTPATIENT_CLINIC_OR_DEPARTMENT_OTHER): Payer: No Typology Code available for payment source | Admitting: Internal Medicine

## 2023-05-28 DIAGNOSIS — E1142 Type 2 diabetes mellitus with diabetic polyneuropathy: Secondary | ICD-10-CM

## 2023-05-28 DIAGNOSIS — E11621 Type 2 diabetes mellitus with foot ulcer: Secondary | ICD-10-CM | POA: Diagnosis not present

## 2023-05-28 DIAGNOSIS — L97524 Non-pressure chronic ulcer of other part of left foot with necrosis of bone: Secondary | ICD-10-CM | POA: Diagnosis not present

## 2023-05-28 LAB — GLUCOSE, CAPILLARY
Glucose-Capillary: 198 mg/dL — ABNORMAL HIGH (ref 70–99)
Glucose-Capillary: 167 mg/dL — ABNORMAL HIGH (ref 70–99)

## 2023-05-28 NOTE — Progress Notes (Addendum)
 HOLT, WOOLBRIGHT ([id number]) [id number] Page 1 of 3 Visit Report for 05/28/2023 HBO Details Patient Name: Date of Service: Billy [name], [name] 05/28/2023 12:00 PM Medical Record Number: [id number] Patient Account Number: [id number] Date of Birth/Sex: Treating RN: 1945/06/29 (78 y.o. [name] Primary Care [name]: [facility], [name] Other Clinician: [name] Referring [name]: Treating [name]: [name], [name] Duos in Treatment: 13 HBO Treatment Course Details Treatment Course Number: 1 Ordering [name]: [name] Treatments Ordered: otal 40 HBO Treatment Start Date: 03/11/2023 HBO Indication: Diabetic Ulcer(s) of the Lower Extremity Standard/Conservative Wound Care tried and failed greater than or equal to 30 days Wound #1 Left, Posterior Calcaneus , Wound #2 Right, Posterior Calcaneus HBO Treatment Details Treatment Number: 35 Patient Type: Outpatient Chamber Type: Monoplace Chamber Serial #: [id number] Treatment Protocol: 2.0 ATA with 90 minutes oxygen , and no air breaks Treatment Details Compression Rate Down: 1.5 psi / minute De-Compression Rate Up: 2.0 psi / minute Air breaks and breathing Decompress Decompress Compress Tx Pressure Begins Reached periods Begins Ends (leave unused spaces blank) Chamber Pressure (ATA 1 2 ------2 1 ) Clock Time (24 hr) 12:27 12:38 - - - - - - 14:08 14:18 Treatment Length: 111 (minutes) Treatment Segments: 4 Vital Signs Capillary Blood Glucose Reference Range: 80 - 120 mg / dl HBO Diabetic Blood Glucose Intervention Range: <131 mg/dl or >750 mg/dl Type: Time Vitals Blood Respiratory Capillary Blood Glucose Pulse Action Pulse: Temperature: Taken: Pressure: Rate: Glucose (mg/dl): Meter #: Oximetry (%) Taken: Pre 12:08 136/43 65 18 97.7 198 BP re-taken Pre 12:10 112/110 71 manual BP taken Pre 12:22 132/60 manual BP ok Post 14:21 138/68 57 18  97.4 167 none per protocol Treatment Response Treatment Toleration: Well Treatment Completion Status: Treatment Completed without Adverse Event Treatment Notes [name] arrived with BP of 136/43 mmHg. Patient was talking during measurement. BP re-taken 112/110 mmHg. He stated that he did eat breakfast and content of meal was appropriate for treatment. He prepared for treatment. Manual BP was taken at bedside with result of 132/60 mmHg. After performing Billy safety check, the patient was placed in the chamber which was compressed with 100% oxygen  at the rate of 1.5 psi/min. He tolerated the treatment and the subsequent decompression of the chamber at the rate of 1.5 psi/min. He denied any issues with ear equalization and/or pain related to barotrauma. His post- treatment vital signs were within normal range except heart rate of 57 bpm. He denied symptoms of bradycardia. He was stable upon discharge with his wife. Additional Procedure Documentation Tissue [name] Physician HBO Attestation: I certify that I supervised this HBO treatment in accordance with Medicare guidelines. Billy trained emergency response team is readily available per Yes [name], [name] ([id number]) [id number] Page 2 of 3 hospital policies and procedures. Continue HBOT as ordered. Yes Electronic Signature(s) Signed: 05/29/2023 5:26:35 PM By: [name] Previous Signature: 05/28/2023 3:55:42 PM Version By: [name] CHT EMT BS , , Previous Signature: 05/28/2023 3:52:15 PM Version By: [name] CHT EMT BS , , Entered By: [name] on 05/29/2023 17:25:43 -------------------------------------------------------------------------------- HBO Safety Checklist Details Patient Name: Date of Service: Billy [name], [name] 05/28/2023 12:00 PM Medical Record Number: [id number] Patient Account Number: [id number] Date of Birth/Sex: Treating RN: 17-Apr-1946 (78 y.o. [name] Primary Care [name]: [facility], [name] Other Clinician: [name] Referring [name]: Treating [name]: [name], [name] Duos in Treatment: 13 HBO Safety Checklist Items Safety Checklist Consent  Form Signed Patient voided / foley secured and emptied When did you last eato 1020 -2 egg, HB bacon, whole wheat bread Last dose of injectable or oral agent 0930 - Metformin Ostomy pouch emptied and vented if applicable NA All implantable devices assessed, documented and approved NA Intravenous access site secured and place NA Valuables secured Linens and cotton and cotton/polyester blend (less than 51% polyester) Personal oil-based products / skin lotions / body lotions removed Wigs or hairpieces removed NA Smoking or tobacco materials removed NA Books / newspapers / magazines / loose paper removed Cologne, aftershave, perfume and deodorant removed Jewelry removed (may wrap wedding band) Make-up removed NA Hair care products removed Battery operated devices (external) removed Heating patches and chemical warmers removed Titanium eyewear removed Nail polish cured greater than 10 hours NA Casting material cured greater than 10 hours NA Hearing aids removed at home Loose dentures or partials removed dentures removed Prosthetics have been removed NA Patient demonstrates correct use of air break device (if applicable) Patient concerns have been addressed Patient grounding bracelet on and cord attached to chamber Specifics for Inpatients (complete in addition to above) Medication sheet sent with patient NA Intravenous medications needed or due during therapy sent with patient NA Drainage tubes (e.g. nasogastric tube or chest tube secured and vented) NA Endotracheal or Tracheotomy tube secured NA Cuff deflated of air and inflated with saline NA Airway suctioned NA Notes Paper version used prior to treatment  start. Electronic Signature(s) Signed: 05/28/2023 3:49:47 PM By: [name] CHT EMT BS , , [name], Signed: 05/28/2023 3:49:47 PM By: [name] CHT EMT BS [place] ([id number]) [id number] Page 3 of 3 , , Entered By: [name] on 05/28/2023 15:49:47

## 2023-05-29 ENCOUNTER — Encounter (HOSPITAL_BASED_OUTPATIENT_CLINIC_OR_DEPARTMENT_OTHER): Payer: No Typology Code available for payment source | Admitting: Internal Medicine

## 2023-05-29 ENCOUNTER — Encounter (HOSPITAL_BASED_OUTPATIENT_CLINIC_OR_DEPARTMENT_OTHER): Payer: No Typology Code available for payment source | Admitting: General Surgery

## 2023-05-29 DIAGNOSIS — E11621 Type 2 diabetes mellitus with foot ulcer: Secondary | ICD-10-CM | POA: Diagnosis not present

## 2023-05-29 DIAGNOSIS — L97522 Non-pressure chronic ulcer of other part of left foot with fat layer exposed: Secondary | ICD-10-CM

## 2023-05-29 DIAGNOSIS — E1151 Type 2 diabetes mellitus with diabetic peripheral angiopathy without gangrene: Secondary | ICD-10-CM

## 2023-05-29 DIAGNOSIS — L97524 Non-pressure chronic ulcer of other part of left foot with necrosis of bone: Secondary | ICD-10-CM | POA: Diagnosis not present

## 2023-05-29 DIAGNOSIS — E1142 Type 2 diabetes mellitus with diabetic polyneuropathy: Secondary | ICD-10-CM

## 2023-05-29 LAB — GLUCOSE, CAPILLARY
Glucose-Capillary: 252 mg/dL — ABNORMAL HIGH (ref 70–99)
Glucose-Capillary: 205 mg/dL — ABNORMAL HIGH (ref 70–99)

## 2023-05-29 NOTE — Progress Notes (Addendum)
 Billy, [name] ([id number]) [id number] Page 1 of 3 Visit Report for 05/29/2023 HBO Details Patient Name: Date of Service: [name], [name] 05/29/2023 12:00 PM Medical Record Number: [id number] Patient Account Number: [id number] Date of Birth/Sex: Treating RN: Dec 23, 1945 (78 y.o. [name] Primary Care Donelda Mailhot: [facility], [place] Other Clinician: Karolyn Sharper Referring Malya Cirillo: Treating Yarel Kilcrease/Extender: [name] in Treatment: 13 HBO Treatment Course Details Treatment Course Number: 1 Ordering Amaiya Scruton: Rufus Sharper T Treatments Ordered: otal 40 HBO Treatment Start Date: 03/11/2023 HBO Indication: Diabetic Ulcer(s) of the Lower Extremity Standard/Conservative Wound Care tried and failed greater than or equal to 30 days Wound #1 Left, Posterior Calcaneus , Wound #2 Right, Posterior Calcaneus HBO Treatment Details Treatment Number: 36 Patient Type: Outpatient Chamber Type: Monoplace Chamber Serial #: [id number] Treatment Protocol: 2.0 ATA with 90 minutes oxygen , and no air breaks Treatment Details Compression Rate Down: 1.5 psi / minute De-Compression Rate Up: 1.5 psi / minute Air breaks and breathing Decompress Decompress Compress Tx Pressure Begins Reached periods Begins Ends (leave unused spaces blank) Chamber Pressure (ATA 1 2 ------2 1 ) Clock Time (24 hr) 12:22 12:33 - - - - - - 14:03 14:14 Treatment Length: 112 (minutes) Treatment Segments: 4 Vital Signs Capillary Blood Glucose Reference Range: 80 - 120 mg / dl HBO Diabetic Blood Glucose Intervention Range: <131 mg/dl or >750 mg/dl Type: Time Vitals Blood Pulse: Respiratory Temperature: Capillary Blood Glucose Pulse Action Taken: Pressure: Rate: Glucose (mg/dl): Meter #: Oximetry (%) Taken: Pre 12:03 134/59 72 12 98.2 252 asymptomatic for hypotension Post 14:17 147/65 60 12 98 205 none per protocol Treatment Response Treatment  Toleration: Well Treatment Completion Status: Treatment Completed without Adverse Event Treatment Notes Mr. Spilde arrived with BP of 134/59 mmHg. He denied symptoms of hypotension. Blood glucose level was 252 mg/dL. He did mention being thirsty, but was otherwise asymptomatic for hyperglycemia. He stated that he did eat breakfast and content of meal was appropriate for treatment. He prepared for treatment. After performing a safety check, the patient was placed in the chamber which was compressed with 100% oxygen  at the rate of 1.5 psi/min. He tolerated the treatment and the subsequent decompression of the chamber at the rate of 1.5 psi/min. He denied any issues with ear equalization and/or pain related to barotrauma. His post-treatment vital signs were within normal range. He was stable upon discharge with his wife. Additional Procedure Documentation Tissue [name] Physician HBO Attestation: I certify that I supervised this HBO treatment in accordance with Medicare guidelines. A trained emergency response team is readily available per Yes hospital policies and procedures. Continue HBOT as ordered. [address] Billy, [name] ([id number]) [id number] Page 2 of 3 Electronic Signature(s) Signed: 05/29/2023 5:26:35 PM By: [name] DO Previous Signature: 05/29/2023 3:34:06 PM Version By: Karolyn Sharper CHT EMT BS , , Previous Signature: 05/29/2023 1:25:42 PM Version By: Karolyn Sharper CHT EMT BS , , Entered By: [name] on 05/29/2023 17:26:08 -------------------------------------------------------------------------------- HBO Safety Checklist Details Patient Name: Date of Service: [name], [name] 05/29/2023 12:00 PM Medical Record Number: [id number] Patient Account Number: [id number] Date of Birth/Sex: Treating RN: 10-Jan-1946 (78 y.o. [name] Primary Care Verbena Boeding: [name] Other Clinician: Karolyn Sharper Referring Lajoya Dombek: Treating Jaquell Seddon/Extender: [name] in Treatment: 13 HBO Safety Checklist Items Safety Checklist Consent Form Signed Patient voided / foley secured and emptied 1015 - 2 bacon, 2 eggs, HB, 2 whole wheat When did you  last eato bread, 1 glass 0% milk Last dose of injectable or oral agent 1000 - Metformin Ostomy pouch emptied and vented if applicable NA All implantable devices assessed, documented and approved NA Intravenous access site secured and place NA Valuables secured Linens and cotton and cotton/polyester blend (less than 51% polyester) Personal oil-based products / skin lotions / body lotions removed Wigs or hairpieces removed NA Smoking or tobacco materials removed NA Books / newspapers / magazines / loose paper removed Cologne, aftershave, perfume and deodorant removed Jewelry removed (may wrap wedding band) Make-up removed NA Hair care products removed NA Battery operated devices (external) removed Heating patches and chemical warmers removed Titanium eyewear removed Nail polish cured greater than 10 hours NA Casting material cured greater than 10 hours NA Hearing aids removed at home Loose dentures or partials removed dentures removed Prosthetics have been removed NA Patient demonstrates correct use of air break device (if applicable) Patient concerns have been addressed Patient grounding bracelet on and cord attached to chamber Specifics for Inpatients (complete in addition to above) Medication sheet sent with patient NA Intravenous medications needed or due during therapy sent with patient NA Drainage tubes (e.g. nasogastric tube or chest tube secured and vented) NA Endotracheal or Tracheotomy tube secured NA Cuff deflated of air and inflated with saline NA Airway suctioned NA Notes Paper version used prior to treatment start. Electronic Signature(s) Signed: 05/29/2023 1:24:09 PM  By: Karolyn Sharper CHT EMT BS , , Entered By: Karolyn Sharper on 05/29/2023 13:24:09 [name] ([id number]) [id number] Page 3 of 3

## 2023-05-29 NOTE — Progress Notes (Signed)
 MELDRICK, BUTTERY ([id number]) [id number] Page 1 of 2 Visit Report for 05/29/2023 Arrival Information Details Patient Name: Date of Service: [name], [name] 05/29/2023 12:00 PM Medical Record Number: [id number] Patient Account Number: [id number] Date of Birth/Sex: Treating RN: [birth year] (78 y.o. [name] Primary Care Koreen Lizaola: [name] Other Clinician: Karolyn Sharper Referring Salote Weidmann: Treating Mihika Surrette/Extender: [name] in Treatment: 13 Visit Information History Since Last Visit All ordered tests and consults were completed: Yes Patient Arrived: [name] Added or deleted any medications: No Arrival Time: 11:50 Any new allergies or adverse reactions: No Accompanied By: self Had a fall or experienced change in No Transfer Assistance: None activities of daily living that may affect Patient Identification Verified: Yes risk of falls: Secondary Verification Process Completed: Yes Signs or symptoms of abuse/neglect since last visito No Patient Requires Transmission-Based Precautions: No Hospitalized since last visit: No Patient Has Alerts: No Implantable device outside of the clinic excluding No cellular tissue based products placed in the center since last visit: Pain Present Now: No Electronic Signature(s) Signed: 05/29/2023 1:29:46 PM By: Karolyn Sharper CHT EMT BS , , Previous Signature: 05/29/2023 1:19:39 PM Version By: Karolyn Sharper CHT EMT BS , , Entered By: Karolyn Sharper on 05/29/2023 13:29:45 -------------------------------------------------------------------------------- Encounter Discharge Information Details Patient Name: Date of Service: [name], [name] 05/29/2023 12:00 PM Medical Record Number: [id number] Patient Account Number: [id number] Date of Birth/Sex: Treating RN: 07/21/1945 (78 y.o. [name] Primary Care Brianda Beitler: [facility], [place] Other Clinician:  Karolyn Sharper Referring Tigerlily Christine: Treating Carisma Troupe/Extender: [name] in Treatment: 13 Encounter Discharge Information Items Discharge Condition: Stable Ambulatory Status: Walker Discharge Destination: Home Transportation: Private Auto Accompanied By: spouse Schedule Follow-up Appointment: No Clinical Summary of Care: Electronic Signature(s) Signed: 05/29/2023 3:35:17 PM By: Karolyn Sharper CHT EMT BS , , Entered By: Karolyn Sharper on 05/29/2023 15:35:16 [name] ([id number]) [id number] Page 2 of 2 -------------------------------------------------------------------------------- Vitals Details Patient Name: Date of Service: [name], [name] 05/29/2023 12:00 PM Medical Record Number: [id number] Patient Account Number: [id number] Date of Birth/Sex: Treating RN: [birth year] (78 y.o. [name] Primary Care Jamae Tison: [name] Other Clinician: Karolyn Sharper Referring Koltin Wehmeyer: Treating Tiawana Forgy/Extender: [name] in Treatment: 13 Vital Signs Time Taken: 12:03 Temperature (F): 98.2 Height (in): 70 Pulse (bpm): 72 Weight (lbs): 196 Respiratory Rate (breaths/min): 12 Body Mass Index (BMI): 28.1 Blood Pressure (mmHg): 134/59 Capillary Blood Glucose (mg/dl): 747 Reference Range: 80 - 120 mg / dl Electronic Signature(s) Signed: 05/29/2023 1:20:15 PM By: Karolyn Sharper CHT EMT BS , , Entered By: Karolyn Sharper on 05/29/2023 13:20:15

## 2023-05-29 NOTE — Progress Notes (Signed)
 [name], [name] ([id number]) [id number] Page 1 of 2 Visit Report for 05/28/2023 Arrival Information Details Patient Name: Date of Service: [name], [name] 05/28/2023 12:00 PM Medical Record Number: [id number] Patient Account Number: [id number] Date of Birth/Sex: Treating RN: 17-Nov-1945 (78 y.o. [name] Primary Care [name]: [name] Other Clinician: Karolyn Sharper Referring [name]: Treating [name]: [name] Duos in Treatment: 13 Visit Information History Since Last Visit All ordered tests and consults were completed: Yes Patient Arrived: [name] Added or deleted any medications: No Arrival Time: 11:53 Any new allergies or adverse reactions: No Accompanied By: spouse Had a fall or experienced change in No Transfer Assistance: None activities of daily living that may affect Patient Identification Verified: Yes risk of falls: Secondary Verification Process Completed: Yes Signs or symptoms of abuse/neglect since last visito No Patient Requires Transmission-Based Precautions: No Hospitalized since last visit: No Patient Has Alerts: No Implantable device outside of the clinic excluding No cellular tissue based products placed in the center since last visit: Pain Present Now: No Electronic Signature(s) Signed: 05/28/2023 3:58:17 PM By: Karolyn Sharper CHT EMT BS , , Previous Signature: 05/28/2023 3:47:39 PM Version By: Karolyn Sharper CHT EMT BS , , Entered By: Karolyn Sharper on 05/28/2023 15:58:17 -------------------------------------------------------------------------------- Encounter Discharge Information Details Patient Name: Date of Service: [name], [name] 05/28/2023 12:00 PM Medical Record Number: [id number] Patient Account Number: [id number] Date of Birth/Sex: Treating RN: 05/21/1946 (78 y.o. [name] Primary Care Lerlene Treadwell: [name] Other  Clinician: Karolyn Sharper Referring Sharod Petsch: Treating Yaretzy Olazabal/Extender: [name] in Treatment: 13 Encounter Discharge Information Items Discharge Condition: Stable Ambulatory Status: Walker Discharge Destination: Home Transportation: Private Auto Accompanied By: spouse Schedule Follow-up Appointment: No Clinical Summary of Care: Electronic Signature(s) Signed: 05/28/2023 4:54:29 PM By: Karolyn Sharper CHT EMT BS , , Entered By: Karolyn Sharper on 05/28/2023 16:54:29 [name] ([id number]) [id number] Page 2 of 2 -------------------------------------------------------------------------------- Vitals Details Patient Name: Date of Service: [name], [name] 05/28/2023 12:00 PM Medical Record Number: [id number] Patient Account Number: [id number] Date of Birth/Sex: Treating RN: 1945/10/05 (78 y.o. [name] Primary Care Danayah Smyre: [name] Other Clinician: Karolyn Sharper Referring Jachelle Fluty: Treating Kaleesi Guyton/Extender: [name] in Treatment: 13 Vital Signs Time Taken: 12:08 Temperature (F): 97.7 Height (in): 70 Pulse (bpm): 65 Weight (lbs): 196 Respiratory Rate (breaths/min): 18 Body Mass Index (BMI): 28.1 Blood Pressure (mmHg): 136/43 Capillary Blood Glucose (mg/dl): 801 Reference Range: 80 - 120 mg / dl Electronic Signature(s) Signed: 05/28/2023 3:48:03 PM By: Karolyn Sharper CHT EMT BS , , Entered By: Karolyn Sharper on 05/28/2023 15:48:03

## 2023-05-30 ENCOUNTER — Encounter (HOSPITAL_BASED_OUTPATIENT_CLINIC_OR_DEPARTMENT_OTHER): Payer: No Typology Code available for payment source | Admitting: Internal Medicine

## 2023-05-30 DIAGNOSIS — L97522 Non-pressure chronic ulcer of other part of left foot with fat layer exposed: Secondary | ICD-10-CM | POA: Diagnosis not present

## 2023-05-30 DIAGNOSIS — E1151 Type 2 diabetes mellitus with diabetic peripheral angiopathy without gangrene: Secondary | ICD-10-CM

## 2023-05-30 DIAGNOSIS — E11621 Type 2 diabetes mellitus with foot ulcer: Secondary | ICD-10-CM | POA: Diagnosis not present

## 2023-05-30 DIAGNOSIS — E1142 Type 2 diabetes mellitus with diabetic polyneuropathy: Secondary | ICD-10-CM | POA: Diagnosis not present

## 2023-05-30 LAB — GLUCOSE, CAPILLARY
Glucose-Capillary: 197 mg/dL — ABNORMAL HIGH (ref 70–99)
Glucose-Capillary: 155 mg/dL — ABNORMAL HIGH (ref 70–99)

## 2023-05-30 NOTE — Progress Notes (Signed)
 PERICLES, CARMICHEAL ([id number]) [id number] Page 1 of 1 Visit Report for 05/29/2023 SuperBill Details Patient Name: Date of Service: [name], [name] 05/29/2023 Medical Record Number: [id number] Patient Account Number: [id number] Date of Birth/Sex: Treating RN: 12/19/1945 (78 y.o. 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

## 2023-05-30 NOTE — Progress Notes (Signed)
 LANNIS, LICHTENWALNER ([id number]) [id number] Page 1 of 1 Visit Report for 05/27/2023 SuperBill Details Patient Name: Date of Service: [name], [name] 05/27/2023 Medical Record Number: [id number] Patient Account Number: [id number] Date of Birth/Sex: Treating RN: [birth year] (78 y.o. 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

## 2023-05-30 NOTE — Progress Notes (Signed)
 ANTONIOS, OSTROW ([id number]) [id number] Page 1 of 8 Visit Report for 05/29/2023 Chief Complaint Document Details Patient Name: Date of Service: [name], [name] 05/29/2023 2:30 PM Medical Record Number: [id number] Patient Account Number: [id number] Date of Birth/Sex: Treating RN: Jan 17, 1946 (78 y.o. M) Primary Care Provider: [name] Other Clinician: Referring Provider: Treating Provider/Extender: [name] [name] in Treatment: 13 Information Obtained from: Patient Chief Complaint 02/26/2023. Patient is here for review of wounds on his bilateral heel tips Electronic Signature(s) Signed: 05/29/2023 5:26:35 PM By: [name] DO Entered By: [name] on 05/29/2023 17:17:13 -------------------------------------------------------------------------------- HPI Details Patient Name: Date of Service: [name], [name] 05/29/2023 2:30 PM Medical Record Number: [id number] Patient Account Number: [id number] Date of Birth/Sex: Treating RN: May 12, 1946 (78 y.o. M) Primary Care Provider: [name] Other Clinician: Referring Provider: Treating Provider/Extender: [name] Treatment: 13 History of Present Illness HPI Description: ADMISSION 02/26/2023 Patient is a 78 year old man with type 2 diabetes. He has had a 72-month history of wounds on his bilateral heels just above the heel tips. They have been using Iodoflex/Iodosorb ointment. The patient has a history of PAD and has a SFA stent on the right. He also also status post left iliac stent. Furthermore he has a history of carotid artery stenosis status post right carotid endarterectomy and a left subclavian artery stenosis. His wound started on his blisters on the heel that of open to wounds with minimal but some depth right greater than left. He also has advanced diabetic neuropathy. The patient is cared for through the [facility]  and has been referred here for hyperbaric oxygen  treatment through his vascular surgeon. Past medical history includes hypertension, hyperlipidemia, type 2 diabetes chronic kidney disease stage IIIb, carotid artery stenosis status post right carotid endarterectomy, left subclavian artery stenosis ABIs done in our clinic were 1.11 on the right and 0.75 on the left these were remarkably similar to what was quoted during the vascular surgery appointment from 02/05/2023. 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 He was referred for hyperbaric oxygen  by the [facility]. His chest x-ray I think shows vascular crowding but no pneumothorax. He has been using Iodoflex on the wound with reasonably good improvement this week. He sees his ENT in 2 days and then they will make a decision about when they want to start HBO 10/24; patient is started HBO I believe he is on treatment 5 today. He had some problems with his right ear on the second dive however he has had no problems since and says he feels well. He slept through treatment yesterday. He has been using Iodosorb ointment on the wounds on both heels for a long period even before he came here. Unfortunately the wound surface still does not look completely as viable as I would like. 11/5; he continues to tolerate HBO fairly well although he is having some fullness in his left ear that is reversed by popping his left ear. He has been using Sorbact hydrogel. He comes in today with the wounds quite a bit smaller especially on the right but also on the left. He is fairly rigorous and offloading these with heel offloading shoes. 11/11; right posterior calcaneus is closed down. The area on the left calcaneus laterally still has some depth. We have been using Sorbact on both sides. He has completed 12 out of [address] treatments JOHNCHARLES, FUSSELMAN ([id number]) [phone].pdf  Page 2 of 8 11/19; right  posterior calcaneus is still closed the area on the left is quite a bit smaller. We have been using Sorbact on the left heel. He is still receiving HBO which was requested by the [facility]. These wounds may be healed soon 12/3; the only remaining wound is the left posterior calcaneus and it is smaller. We've been using sore act. He is continuing HBO. On presentation he had a similar wound on the right posterior heel which is healed two or three weeks ago. 12/10; left posterior lateral calcaneus. Unfortunately no improvement today. Using Sorbact and hydrogel her intake nurses saying that the Sorbact was not adherent to the wound. He is continuing in HBO 12/17; left posterior lateral calcaneus. Small punched-out area remains. Unfortunately cannot continue HBO for the foreseeable future his wife had a fall with multiple rib fractures and had to be admitted the hospital. We are using endoform to this wound. The right heel heel sometime ago The patient is complaining of some pain in the left foot. No predictable claudication however. He does have a history of PAD and has a left iliac artery stent. ABI in our clinic was 0.75 05/21/2023: He has a new blister on his right posterior heel. The blister is intact and the fluid is clear. He admits to being noncompliant with Prevalon boot use in bed and thinks he probably rubbed it on the sheets. The left heel ulcer has a fair amount of slough and some breakdown of the skin immediately adjacent to the 1 o'clock position. He has resumed hyperbaric oxygen  therapy. 05/29/2023; patient's right posterior heel blister is still intact and mostly dried. His remaining wound is to the left heel wound. He is completing hyperbaric oxygen  therapy for work [employer] grade 2 and is tolerating this well. Electronic Signature(s) Signed: 05/29/2023 5:26:35 PM By: [name] DO Entered By: [name] on 05/29/2023  17:19:35 -------------------------------------------------------------------------------- Physical Exam Details Patient Name: Date of Service: [name], [name] 05/29/2023 2:30 PM Medical Record Number: [id number] Patient Account Number: [id number] Date of Birth/Sex: Treating RN: 1945-06-11 (78 y.o. M) Primary Care Provider: [name] Other Clinician: Referring Provider: Treating Provider/Extender: [name] Treatment: 13 Constitutional respirations regular, non-labored and within target range for patient.. Cardiovascular 2+ dorsalis pedis/posterior tibialis pulses. Psychiatric pleasant and cooperative. Notes Intact blister on the right posterior heel that is mostly dried. T the left heel there is an open wound with granulation tissue at the base. No signs of surrounding o infection. Electronic Signature(s) Signed: 05/29/2023 5:26:35 PM By: [name] DO Entered By: [name] on 05/29/2023 17:20:13 -------------------------------------------------------------------------------- Physician Orders Details Patient Name: Date of Service: [name], [name] 05/29/2023 2:30 PM Medical Record Number: [id number] Patient Account Number: [id number] Date of Birth/Sex: Treating RN: 12/28/1945 (78 y.o. [name] Primary Care Provider: [name] Other Clinician: Referring Provider: Treating Provider/Extender: [name] in Treatment: 13 The following information was scribed by: Drury Nestle The information was scribed for: Johnedward, Brodrick ([id number]) [id number] Page 3 of 8 Verbal / Phone Orders: No Diagnosis Coding Follow-up Appointments ppointment in 1 week. - Dr. Rosan after HBO (already scheduled) Return A ppointment in 2 weeks. - Dr. Rosan schedule for patient Return A Return appointment in 3 weeks. - Dr. Rosan schedule  for patient Other: - Once wounds close will discontinue hyperbarics. will request from [facility] more hyperbaric oxygen  therapy. Anesthetic (In clinic) Topical Lidocaine  4% applied  to wound bed - In clinic Bathing/ Shower/ Hygiene May shower and wash wound with soap and water. - with dressing changes Off-Loading Wound #1 Left,Posterior Calcaneus Wedge shoe to: - Off loading shoes bilaterally to alleviate pressure from heels. Turn and reposition every 2 hours Prevalon Boot - bilaterally at bedtime Non Wound Condition Right Lower Extremity pply the following to affected area as directed: - bordered foam as needed for protection on right foot. A Hyperbaric Oxygen  Therapy Wound #1 Left,Posterior Calcaneus Evaluate for HBO Therapy - Per patient, he has claustrophobia. If appropriate for treatment, begin HBOT per protocol: 2.0 ATA for 90 Minutes without A Breaks ir Total Number of Treatments: - 40 05/29/2023: additional 20 more treatments One treatments per day (delivered Monday through Friday unless otherwise specified in Special Instructions below): Finger stick Blood Glucose Pre- and Post- HBOT Treatment. Follow Hyperbaric Oxygen  Glycemia Protocol Give two 4oz orange juices in addition to Glucerna when the glycemic protocol is used. Afrin (Oxymetazoline HCL) 0.05% nasal spray - 1 spray in both nostrils daily as needed prior to HBO treatment for difficulty clearing ears Wound Treatment Wound #1 - Calcaneus Wound Laterality: Left, Posterior Cleanser: Wound Cleanser Every Other Day/30 Days Discharge Instructions: Cleanse the wound with wound cleanser prior to applying a clean dressing using gauze sponges, not tissue or cotton balls. Peri-Wound Care: Skin Prep Every Other Day/30 Days Discharge Instructions: Use skin prep as directed Prim Dressing: Endoform 2x2 in Every Other Day/30 Days ary Discharge Instructions: Moisten with saline Secondary Dressing: ALLEVYN Heel 4 1/2in x 5 1/2in / 10.5cm x  13.5cm Every Other Day/30 Days Discharge Instructions: Apply over primary dressing as directed. Secondary Dressing: Drawtex 4x4 in Every Other Day/30 Days Discharge Instructions: Apply over primary dressing cut to fit inside wound Secondary Dressing: Woven Gauze Sponge, Non-Sterile 4x4 in Every Other Day/30 Days Discharge Instructions: Apply over primary dressing as directed. Secured With: [employer], 4.5x3.1 (in/yd) Every Other Day/30 Days Discharge Instructions: Secure with Kerlix as directed. Secured With: 29M Medipore H Soft Cloth Surgical T ape, 4 x 10 (in/yd) Every Other Day/30 Days Discharge Instructions: Secure with tape as directed. GLYCEMIA INTERVENTIONS PROTOCOL PRE-HBO GLYCEMIA INTERVENTIONS ACTION INTERVENTION Obtain pre-HBO capillary blood glucose (ensure 1 physician order is in chart). A. Notify HBO physician and await physician orders. 2 If result is 70 mg/dl or below: B. If the result meets the hospital definition of a critical result, follow hospital policy. A. Give patient an 8 ounce Glucerna Shake, an 8 ounce Ensure, or 8 ounces of a Glucerna/Ensure equivalent dietary MATTIA, LIFORD ([id number]) [id number] Page 4 of 8 supplement*. B. Wait 30 minutes. If result is 71 mg/dl to 869 mg/dl: C. Retest patients capillary blood glucose (CBG). D. If result greater than or equal to 110 mg/dl, proceed with HBO. If result less than 110 mg/dl, notify HBO physician and consider holding HBO. If result is 131 mg/dl to 750 mg/dl: A. Proceed with HBO. A. Notify HBO physician and await physician orders. B. It is recommended to hold HBO and do If result is 250 mg/dl or greater: blood/urine ketone testing. C. If the result meets the hospital definition of a critical result, follow hospital policy. POST-HBO GLYCEMIA INTERVENTIONS ACTION INTERVENTION Obtain post HBO capillary blood glucose (ensure 1 physician order is in chart). A. Notify  HBO physician and await physician orders. 2 If result is 70 mg/dl or below: B. If the result meets the hospital definition of a critical result, follow hospital policy. A. Give patient  an 8 ounce Glucerna Shake, an 8 ounce Ensure, or 8 ounces of a Glucerna/Ensure equivalent dietary supplement*. B. Wait 15 minutes for symptoms of If result is 71 mg/dl to 899 mg/dl: hypoglycemia (i.e. nervousness, anxiety, sweating, chills, clamminess, irritability, confusion, tachycardia or dizziness). C. If patient asymptomatic, discharge patient. If patient symptomatic, repeat capillary blood glucose (CBG) and notify HBO physician. If result is 101 mg/dl to 750 mg/dl: A. Discharge patient. A. Notify HBO physician and await physician orders. B. It is recommended to do blood/urine ketone If result is 250 mg/dl or greater: testing. C. If the result meets the hospital definition of a critical result, follow hospital policy. *Juice or candies are NOT equivalent products. If patient refuses the Glucerna or Ensure, please consult the hospital dietitian for an appropriate substitute. Electronic Signature(s) Signed: 05/29/2023 5:26:35 PM By: [name] DO Entered By: [name] on 05/29/2023 17:20:22 -------------------------------------------------------------------------------- Problem List Details Patient Name: Date of Service: [name], [name] 05/29/2023 2:30 PM Medical Record Number: [id number] Patient Account Number: [id number] Date of Birth/Sex: Treating RN: 1946-04-06 (78 y.o. M) Primary Care Provider: [name] Other Clinician: Referring Provider: Treating Provider/Extender: [name] Treatment: 13 Active Problems ICD-10 Encounter Code Description Active Date MDM Diagnosis E11.621 Type 2 diabetes mellitus with foot ulcer 02/26/2023 No Yes L97.522 Non-pressure chronic ulcer of other part of left foot with fat layer exposed 05/29/2023  No Yes E11.51 Type 2 diabetes mellitus with diabetic peripheral angiopathy without gangrene 02/26/2023 No Yes ELDREDGE, VELDHUIZEN ([id number]) [id number].pdf Page 5 of 8 E11.42 Type 2 diabetes mellitus with diabetic polyneuropathy 02/26/2023 No Yes Inactive Problems ICD-10 Code Description Active Date Inactive Date 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 02/26/2023 02/26/2023 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 02/26/2023 02/26/2023 L97.521 Non-pressure chronic ulcer of other part of left foot limited to breakdown of skin 05/07/2023 05/07/2023 Resolved Problems Electronic Signature(s) Signed: 05/29/2023 5:26:35 PM By: [name] DO Entered By: [name] on 05/29/2023 17:23:27 -------------------------------------------------------------------------------- Progress Note Details Patient Name: Date of Service: [name], [name] 05/29/2023 2:30 PM Medical Record Number: [id number] Patient Account Number: [id number] Date of Birth/Sex: Treating RN: [birth year] (78 y.o. M) Primary Care Provider: [name] Other Clinician: Referring Provider: Treating Provider/Extender: [name] Treatment: 13 Subjective Chief Complaint Information obtained from Patient 02/26/2023. Patient is here for review of wounds on his bilateral heel tips History of Present Illness (HPI) ADMISSION 02/26/2023 Patient is a 78 year old man with type 2 diabetes. He has had a 51-month history of wounds on his bilateral heels just above the heel tips. They have been using Iodoflex/Iodosorb ointment. The patient has a history of PAD and has a SFA stent on the right. He also also status post left iliac stent. Furthermore he has a history of carotid artery stenosis status post right carotid endarterectomy and a left subclavian artery stenosis. His wound  started on his blisters on the heel that of open to wounds with minimal but some depth right greater than left. He also has advanced diabetic neuropathy. The patient is cared for through the [facility] and has been referred here for hyperbaric oxygen  treatment through his vascular surgeon. Past medical history includes hypertension, hyperlipidemia, type 2 diabetes chronic kidney disease stage IIIb, carotid artery stenosis status post right carotid endarterectomy, left subclavian artery stenosis ABIs done in our clinic were 1.11 on the right and 0.75  on the left these were remarkably similar to what was quoted during the vascular surgery appointment from 02/05/2023. 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 He was referred for hyperbaric oxygen  by the [facility]. His chest x-ray I think shows vascular crowding but no pneumothorax. He has been using Iodoflex on the wound with reasonably good improvement this week. He sees his ENT in 2 days and then they will make a decision about when they want to start HBO 10/24; patient is started HBO I believe he is on treatment 5 today. He had some problems with his right ear on the second dive however he has had no problems since and says he feels well. He slept through treatment yesterday. He has been using Iodosorb ointment on the wounds on both heels for a long period even before he came here. Unfortunately the wound surface still does not look completely as viable as I would like. 11/5; he continues to tolerate HBO fairly well although he is having some fullness in his left ear that is reversed by popping his left ear. He has been using Sorbact hydrogel. He comes in today with the wounds quite a bit smaller especially on the right but also on the left. He is fairly rigorous and offloading these with heel offloading shoes. 11/11; right posterior calcaneus is closed down. The area on the left calcaneus laterally still has  some depth. We have been using Sorbact on both sides. He KESHAUN, DUBEY ([id number]) [id number] Page 6 of 8 has completed 12 out of 40 treatments 11/19; right posterior calcaneus is still closed the area on the left is quite a bit smaller. We have been using Sorbact on the left heel. He is still receiving HBO which was requested by the [facility]. These wounds may be healed soon 12/3; the only remaining wound is the left posterior calcaneus and it is smaller. We've been using sore act. He is continuing HBO. On presentation he had a similar wound on the right posterior heel which is healed two or three weeks ago. 12/10; left posterior lateral calcaneus. Unfortunately no improvement today. Using Sorbact and hydrogel her intake nurses saying that the Sorbact was not adherent to the wound. He is continuing in HBO 12/17; left posterior lateral calcaneus. Small punched-out area remains. Unfortunately cannot continue HBO for the foreseeable future his wife had a fall with multiple rib fractures and had to be admitted the hospital. We are using endoform to this wound. The right heel heel sometime ago The patient is complaining of some pain in the left foot. No predictable claudication however. He does have a history of PAD and has a left iliac artery stent. ABI in our clinic was 0.75 05/21/2023: He has a new blister on his right posterior heel. The blister is intact and the fluid is clear. He admits to being noncompliant with Prevalon boot use in bed and thinks he probably rubbed it on the sheets. The left heel ulcer has a fair amount of slough and some breakdown of the skin immediately adjacent to the 1 o'clock position. He has resumed hyperbaric oxygen  therapy. 05/29/2023; patient's right posterior heel blister is still intact and mostly dried. His remaining wound is to the left heel wound. He is completing hyperbaric oxygen  therapy for work [employer] grade 2 and is tolerating this  well. Objective Constitutional respirations regular, non-labored and within target range for patient.. Vitals Time Taken: 2:30 PM, Height: 70  in, Weight: 196 lbs, BMI: 28.1, Temperature: 98.2 F, Pulse: 72 bpm, Respiratory Rate: 12 breaths/min, Blood Pressure: 134/59 mmHg, Capillary Blood Glucose: 252 mg/dl. Cardiovascular 2+ dorsalis pedis/posterior tibialis pulses. Psychiatric pleasant and cooperative. General Notes: Intact blister on the right posterior heel that is mostly dried. T the left heel there is an open wound with granulation tissue at the base. No signs o of surrounding infection. Integumentary (Hair, Skin) Wound #1 status is Open. Original cause of wound was Blister. The date acquired was: 01/20/2023. The wound has been in treatment 13 weeks. The wound is located on the Left,Posterior Calcaneus. The wound measures 0.9cm length x 0.5cm width x 0.4cm depth; 0.353cm^2 area and 0.141cm^3 volume. There is Fat Layer (Subcutaneous Tissue) exposed. There is no tunneling or undermining noted. There is a medium amount of serosanguineous drainage noted. The wound margin is distinct with the outline attached to the wound base. There is large (67-100%) red, pink granulation within the wound bed. There is a small (1-33%) amount of necrotic tissue within the wound bed including Adherent Slough. The periwound skin appearance had no abnormalities noted for texture. The periwound skin appearance had no abnormalities noted for moisture. The periwound skin appearance had no abnormalities noted for color. Periwound temperature was noted as No Abnormality. The periwound has tenderness on palpation. Assessment Active Problems ICD-10 Type 2 diabetes mellitus with foot ulcer Non-pressure chronic ulcer of other part of left foot with fat layer exposed Type 2 diabetes mellitus with diabetic peripheral angiopathy without gangrene Type 2 diabetes mellitus with diabetic polyneuropathy Patient's left heel  wound appears well-healing. I recommended continued course with endoform. Continue Prevalon boot when in bed to help with offloading. Continue hyperbaric oxygen  therapy as this has helped significantly for his wound healing. Follow-up in 1 week. Plan Follow-up Appointments: Return Appointment in 1 week. - Dr. Rosan after HBO (already scheduled) Return Appointment in 2 weeks. - Dr. Rosan schedule for patient Return appointment in 3 weeks. - Dr. Rosan schedule for patient Other: - Once wounds close will discontinue hyperbarics. will request from [facility] more hyperbaric oxygen  therapy. Anesthetic: KAPENA, HAMME ([id number]) [id number] Page 7 of 8 (In clinic) Topical Lidocaine  4% applied to wound bed - In clinic Bathing/ Shower/ Hygiene: May shower and wash wound with soap and water. - with dressing changes Off-Loading: Wound #1 Left,Posterior Calcaneus: Wedge shoe to: - Off loading shoes bilaterally to alleviate pressure from heels. Turn and reposition every 2 hours Prevalon Boot - bilaterally at bedtime Non Wound Condition: Apply the following to affected area as directed: - bordered foam as needed for protection on right foot. Hyperbaric Oxygen  Therapy: Wound #1 Left,Posterior Calcaneus: Evaluate for HBO Therapy - Per patient, he has claustrophobia. If appropriate for treatment, begin HBOT per protocol: 2.0 ATA for 90 Minutes without Air Breaks T Number of Treatments: - 40 05/29/2023: additional 20 more treatments otal One treatments per day (delivered Monday through Friday unless otherwise specified in Special Instructions below): Finger stick Blood Glucose Pre- and Post- HBOT Treatment. Follow Hyperbaric Oxygen  Glycemia Protocol Give two 4oz orange juices in addition to Glucerna when the glycemic protocol is used. Afrin (Oxymetazoline HCL) 0.05% nasal spray - 1 spray in both nostrils daily as needed prior to HBO treatment for difficulty clearing  ears WOUND #1: - Calcaneus Wound Laterality: Left, Posterior Cleanser: Wound Cleanser Every Other Day/30 Days Discharge Instructions: Cleanse the wound with wound cleanser prior to applying a clean dressing using gauze sponges, not tissue or cotton balls. Peri-Wound  Care: Skin Prep Every Other Day/30 Days Discharge Instructions: Use skin prep as directed Prim Dressing: Endoform 2x2 in Every Other Day/30 Days ary Discharge Instructions: Moisten with saline Secondary Dressing: ALLEVYN Heel 4 1/2in x 5 1/2in / 10.5cm x 13.5cm Every Other Day/30 Days Discharge Instructions: Apply over primary dressing as directed. Secondary Dressing: Drawtex 4x4 in Every Other Day/30 Days Discharge Instructions: Apply over primary dressing cut to fit inside wound Secondary Dressing: Woven Gauze Sponge, Non-Sterile 4x4 in Every Other Day/30 Days Discharge Instructions: Apply over primary dressing as directed. Secured With: [employer], 4.5x3.1 (in/yd) Every Other Day/30 Days Discharge Instructions: Secure with Kerlix as directed. Secured With: 87M Medipore H Soft Cloth Surgical T ape, 4 x 10 (in/yd) Every Other Day/30 Days Discharge Instructions: Secure with tape as directed. 1. Endoform 2. Prevalon boots 3. Continue hyperbaric oxygen  therapy 4. Follow-up in 1 week Electronic Signature(s) Signed: 05/29/2023 5:26:35 PM By: [name] DO Signed: 05/29/2023 5:28:22 PM By: Drury Nestle RN, BSN Entered By: Drury Nestle on 05/29/2023 17:25:12 -------------------------------------------------------------------------------- SuperBill Details Patient Name: Date of Service: [name], [name] 05/29/2023 Medical Record Number: [id number] Patient Account Number: [id number] Date of Birth/Sex: Treating RN: 1945-11-04 (78 y.o. 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 1 Quantity: pdf Page 8 of 8 Physician Procedures : CPT4 Code Description Modifier [phone] 99213 - WC PHYS LEVEL 3 - EST PT ICD-10 Diagnosis Description 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.51 Type 2  diabetes mellitus with diabetic peripheral 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 Quantity: 1 Electronic Signature(s) Signed: 05/29/2023 5:26:35 PM By: [name] DO Entered By: [name] on 05/29/2023 17:23:49

## 2023-05-30 NOTE — Progress Notes (Signed)
 [name], [name] ([id number]) [id number] Page 1 of 1 Visit Report for 05/28/2023 SuperBill Details Patient Name: Date of Service: [name], [name] 05/28/2023 Medical Record Number: [id number] Patient Account Number: [id number] Date of Birth/Sex: Treating RN: 04/11/1946 (78 y.o. 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 Facility Procedures CPT4 Code Description Modifier Quantity [phone] G0277-(Facility Use Only) HBOT full body chamber, , 4 ICD-10 Diagnosis Description 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

## 2023-05-31 ENCOUNTER — Encounter (HOSPITAL_BASED_OUTPATIENT_CLINIC_OR_DEPARTMENT_OTHER): Payer: No Typology Code available for payment source | Admitting: Internal Medicine

## 2023-05-31 DIAGNOSIS — E11621 Type 2 diabetes mellitus with foot ulcer: Secondary | ICD-10-CM | POA: Diagnosis not present

## 2023-05-31 LAB — GLUCOSE, CAPILLARY
Glucose-Capillary: 179 mg/dL — ABNORMAL HIGH (ref 70–99)
Glucose-Capillary: 178 mg/dL — ABNORMAL HIGH (ref 70–99)

## 2023-05-31 NOTE — Progress Notes (Addendum)
 RIK, WADEL ([id number]) [id number] Page 1 of 2 Visit Report for 05/30/2023 HBO Details Patient Name: Date of Service: [name], [name] 05/30/2023 12:00 PM Medical Record Number: [id number] Patient Account Number: [id number] Date of Birth/Sex: Treating RN: [birth year] (78 y.o. [name] Primary Care Andraya Frigon: [facility] [place] Other Clinician: Karolyn Sharper Referring Maicol Bowland: Treating Jaspreet Hollings/Extender: [name] Treatment: 13 HBO Treatment Course Details Treatment Course Number: 1 Ordering Corbitt Cloke: Rufus Sharper T Treatments Ordered: otal 40 HBO Treatment Start Date: 03/11/2023 HBO Indication: Diabetic Ulcer(s) of the Lower Extremity Standard/Conservative Wound Care tried and failed greater than or equal to 30 days Wound #1 Left, Posterior Calcaneus , Wound #2 Right, Posterior Calcaneus HBO Treatment Details Treatment Number: 37 Patient Type: Outpatient Chamber Type: Monoplace Chamber Serial #: [id number] Treatment Protocol: 2.0 ATA with 90 minutes oxygen , and no air breaks Treatment Details Compression Rate Down: 1.5 psi / minute De-Compression Rate Up: 2.0 psi / minute Air breaks and breathing Decompress Decompress Compress Tx Pressure Begins Reached periods Begins Ends (leave unused spaces blank) Chamber Pressure (ATA 1 2 ------2 1 ) Clock Time (24 hr) 12:34 12:47 - - - - - - 14:17 14:30 Treatment Length: 116 (minutes) Treatment Segments: 4 Vital Signs Capillary Blood Glucose Reference Range: 80 - 120 mg / dl HBO Diabetic Blood Glucose Intervention Range: <131 mg/dl or >750 mg/dl Type: Time Vitals Blood Respiratory Capillary Blood Glucose Pulse Action Pulse: Temperature: Taken: Pressure: Rate: Glucose (mg/dl): Meter #: Oximetry (%) Taken: Pre 12:05 138/69 68 18 98.2 197 none per protocol Post 14:33 133/60 60 18 97.7 155 none per protocol Treatment Response Treatment  Toleration: Well Treatment Completion Status: Treatment Completed without Adverse Event Treatment Notes Mr. Schellenberg arrived with normal vital signs. He stated that he did eat breakfast and content of meal was appropriate for treatment. He prepared for treatment. After performing Billy safety check, the patient was placed in the chamber which was compressed with 100% oxygen  at the rate of 2 psi/min after confirming normal ear equalization. He tolerated the treatment and the subsequent decompression of the chamber at the rate of 2 psi/min. He denied any issues with ear equalization and/or pain related to barotrauma. His post-treatment vital signs were within normal range. He was stable upon discharge with his son. Additional Procedure Documentation Tissue [name] Physician HBO Attestation: I certify that I supervised this HBO treatment in accordance with Medicare guidelines. Billy trained emergency response team is readily available per Yes hospital policies and procedures. Continue HBOT as ordered. [address] ARMEL, RABBANI ([id number]) [id number] Page 2 of 2 Electronic Signature(s) Signed: 06/04/2023 12:07:31 PM By: [name] DO Previous Signature: 05/30/2023 6:17:23 PM Version By: Karolyn Sharper CHT EMT BS , , Entered By: [name] on 06/04/2023 12:07:17 -------------------------------------------------------------------------------- HBO Safety Checklist Details Patient Name: Date of Service: [name], [name] 05/30/2023 12:00 PM Medical Record Number: [id number] Patient Account Number: [id number] Date of Birth/Sex: Treating RN: 08-08-1945 (78 y.o. [name] Primary Care Tabita Corbo: [name] Other Clinician: Karolyn Sharper Referring Velita Quirk: Treating Katlyn Muldrew/Extender: [name] Treatment: 13 HBO Safety Checklist Items Safety Checklist Consent Form Signed Patient voided / foley  secured and emptied 1100 - 2 egg sausage patty, HB, bread whole When did you last eato wheat x 2, milk, coffee Last dose of injectable or oral agent 0900 - Metformin Ostomy pouch emptied and vented if applicable NA All implantable devices assessed, documented and approved NA  Intravenous access site secured and place NA Valuables secured Linens and cotton and cotton/polyester blend (less than 51% polyester) Personal oil-based products / skin lotions / body lotions removed Wigs or hairpieces removed NA Smoking or tobacco materials removed NA Books / newspapers / magazines / loose paper removed Cologne, aftershave, perfume and deodorant removed Jewelry removed (may wrap wedding band) Make-up removed NA Hair care products removed Battery operated devices (external) removed Heating patches and chemical warmers removed Titanium eyewear removed Nail polish cured greater than 10 hours NA Casting material cured greater than 10 hours NA Hearing aids removed at home Loose dentures or partials removed dentures removed Prosthetics have been removed NA Patient demonstrates correct use of air break device (if applicable) Patient concerns have been addressed Patient grounding bracelet on and cord attached to chamber Specifics for Inpatients (complete in addition to above) Medication sheet sent with patient NA Intravenous medications needed or due during therapy sent with patient NA Drainage tubes (e.g. nasogastric tube or chest tube secured and vented) NA Endotracheal or Tracheotomy tube secured NA Cuff deflated of air and inflated with saline NA Airway suctioned NA Notes Paper version used prior to treatment start. Electronic Signature(s) Signed: 05/30/2023 6:13:49 PM By: Karolyn Sharper CHT EMT BS , , Entered By: Karolyn Sharper on 05/30/2023 18:13:49

## 2023-05-31 NOTE — Progress Notes (Signed)
 KARVER, FADDEN ([id number]) [id number] Page 1 of 2 Visit Report for 05/31/2023 HBO Details Patient Name: Date of Service: [name], [name] 05/31/2023 9:30 A M Medical Record Number: [id number] Patient Account Number: [id number] Date of Birth/Sex: Treating RN: [birth year] (78 y.o. [name] Primary Care Jeweliana Dudgeon: [name] Other Clinician: Karolyn Sharper Referring Joliana Claflin: Treating Bergen Magner/Extender: [name] Treatment: 13 HBO Treatment Course Details Treatment Course Number: 1 Ordering Morrill Bomkamp: Rufus Sharper T Treatments Ordered: otal 40 HBO Treatment Start Date: 03/11/2023 HBO Indication: Diabetic Ulcer(s) of the Lower Extremity Standard/Conservative Wound Care tried and failed greater than or equal to 30 days Wound #1 Left, Posterior Calcaneus , Wound #2 Right, Posterior Calcaneus HBO Treatment Details Treatment Number: 38 Patient Type: Outpatient Chamber Type: Monoplace Chamber Serial #: [id number] Treatment Protocol: 2.0 ATA with 90 minutes oxygen , and no air breaks Treatment Details Compression Rate Down: 1.5 psi / minute De-Compression Rate Up: 2.0 psi / minute Air breaks and breathing Decompress Decompress Compress Tx Pressure Begins Reached periods Begins Ends (leave unused spaces blank) Chamber Pressure (ATA 1 2 ------2 1 ) Clock Time (24 hr) 08:31 08:43 - - - - - - 10:13 10:22 Treatment Length: 111 (minutes) Treatment Segments: 4 Vital Signs Capillary Blood Glucose Reference Range: 80 - 120 mg / dl HBO Diabetic Blood Glucose Intervention Range: <131 mg/dl or >750 mg/dl Type: Time Vitals Blood Respiratory Capillary Blood Glucose Pulse Action Pulse: Temperature: Taken: Pressure: Rate: Glucose (mg/dl): Meter #: Oximetry (%) Taken: Pre 08:05 141/63 67 12 97.7 179 none per protocol Post 10:23 144/62 61 16 97.7 178 none per protocol Treatment Response Treatment  Toleration: Well Treatment Completion Status: Treatment Completed without Adverse Event Treatment Notes Mr. Want arrived with normal vital signs. He stated that he did eat breakfast and content of meal was appropriate for treatment. He prepared for treatment. After performing a safety check, the patient was placed in the chamber which was compressed with 100% oxygen  at the rate of 2 psi/min after confirming normal ear equalization. He tolerated the treatment and the subsequent decompression of the chamber at the rate of 2 psi/min. He denied any issues with ear equalization and/or pain related to barotrauma. His post-treatment vital signs were within normal range. He was stable upon discharge with his son. Additional Procedure Documentation Tissue [name] Electronic Signature(s) Signed: 05/31/2023 2:03:24 PM By: Karolyn Sharper CHT EMT BS , , Entered By: Karolyn Sharper on 05/31/2023 14:03:23 [name] ([id number]) [id number] Page 2 of 2 -------------------------------------------------------------------------------- HBO Safety Checklist Details Patient Name: Date of Service: [name], [name] 05/31/2023 9:30 A M Medical Record Number: [id number] Patient Account Number: [id number] Date of Birth/Sex: Treating RN: 09-05-1945 (78 y.o. [name] Primary Care Jessyka Austria: [name] Other Clinician: Karolyn Sharper Referring Abrar Bilton: Treating Rashema Seawright/Extender: [name] Treatment: 13 HBO Safety Checklist Items Safety Checklist Consent Form Signed Patient voided / foley secured and emptied When did you last eato 0600 - 3 egg 2 HB, Glass Milk Last dose of injectable or oral agent 0630 - metformin Ostomy pouch emptied and vented if applicable NA All implantable devices assessed, documented and approved NA Intravenous access site secured and place NA Valuables secured Linens  and cotton and cotton/polyester blend (less than 51% polyester) Personal oil-based products / skin lotions / body lotions removed Wigs or hairpieces removed NA Smoking or tobacco materials removed NA Books / newspapers / magazines / loose paper removed  Cologne, aftershave, perfume and deodorant removed Jewelry removed (may wrap wedding band) Make-up removed NA Hair care products removed Battery operated devices (external) removed Heating patches and chemical warmers removed Titanium eyewear removed Nail polish cured greater than 10 hours NA Casting material cured greater than 10 hours NA Hearing aids removed at home Loose dentures or partials removed dentures removed Prosthetics have been removed NA Patient demonstrates correct use of air break device (if applicable) Patient concerns have been addressed Patient grounding bracelet on and cord attached to chamber Specifics for Inpatients (complete in addition to above) Medication sheet sent with patient NA Intravenous medications needed or due during therapy sent with patient NA Drainage tubes (e.g. nasogastric tube or chest tube secured and vented) NA Endotracheal or Tracheotomy tube secured NA Cuff deflated of air and inflated with saline NA Airway suctioned NA Notes Paper version used prior to treatment start. Electronic Signature(s) Signed: 05/31/2023 2:01:07 PM By: Karolyn Sharper CHT EMT BS , , Entered By: Karolyn Sharper on 05/31/2023 14:01:07

## 2023-05-31 NOTE — Progress Notes (Signed)
 ASHELY, JOSHUA ([id number]) [id number] Page 1 of 2 Visit Report for 05/31/2023 Arrival Information Details Patient Name: Date of Service: [name], [name] 05/31/2023 9:30 A M Medical Record Number: [id number] Patient Account Number: [id number] Date of Birth/Sex: Treating RN: May 17, 1946 (78 y.o. [name] Primary Care Brynlie Daza: [name] Other Clinician: Karolyn Sharper Referring Arvle Grabe: Treating Janesha Brissette/Extender: [name] Treatment: 13 Visit Information History Since Last Visit All ordered tests and consults were completed: Yes Patient Arrived: [name] Added or deleted any medications: No Arrival Time: 08:01 Any new allergies or adverse reactions: No Accompanied By: self Had a fall or experienced change in No Transfer Assistance: None activities of daily living that may affect Patient Identification Verified: Yes risk of falls: Secondary Verification Process Completed: Yes Signs or symptoms of abuse/neglect since last visito No Patient Requires Transmission-Based Precautions: No Hospitalized since last visit: No Patient Has Alerts: No Implantable device outside of the clinic excluding No cellular tissue based products placed in the center since last visit: Pain Present Now: No Electronic Signature(s) Signed: 05/31/2023 1:58:32 PM By: Karolyn Sharper CHT EMT BS , , Entered By: Karolyn Sharper on 05/31/2023 13:58:32 -------------------------------------------------------------------------------- Encounter Discharge Information Details Patient Name: Date of Service: [name] LD, [name] 05/31/2023 9:30 A M Medical Record Number: [id number] Patient Account Number: [id number] Date of Birth/Sex: Treating RN: [birth year] (78 y.o. [name] Primary Care Luismario Coston: [name] Other Clinician: Karolyn Sharper Referring Creed Kail: Treating Quorra Rosene/Extender: [name] Treatment: 13 Encounter Discharge Information Items Discharge Condition: Stable Ambulatory Status: Walker Discharge Destination: Home Transportation: Private Auto Accompanied By: son Schedule Follow-up Appointment: No Clinical Summary of Care: Electronic Signature(s) Signed: 05/31/2023 2:04:53 PM By: Karolyn Sharper CHT EMT BS , , Entered By: Karolyn Sharper on 05/31/2023 14:04:53 [name] ([id number]) [id number] Page 2 of 2 -------------------------------------------------------------------------------- Vitals Details Patient Name: Date of Service: [name], [name] 05/31/2023 9:30 A M Medical Record Number: [id number] Patient Account Number: [id number] Date of Birth/Sex: Treating RN: [birth year] (78 y.o. [name] Primary Care Dawon Troop: [name] Other Clinician: Karolyn Sharper Referring Gulianna Hornsby: Treating Adrena Nakamura/Extender: [name] Treatment: 13 Vital Signs Time Taken: 08:05 Temperature (F): 97.7 Height (in): 70 Pulse (bpm): 67 Weight (lbs): 196 Respiratory Rate (breaths/min): 12 Body Mass Index (BMI): 28.1 Blood Pressure (mmHg): 141/63 Capillary Blood Glucose (mg/dl): 820 Reference Range: 80 - 120 mg / dl Electronic Signature(s) Signed: 05/31/2023 1:59:00 PM By: Karolyn Sharper CHT EMT BS , , Entered By: Karolyn Sharper on 05/31/2023 13:59:00

## 2023-05-31 NOTE — Progress Notes (Signed)
 [name] ([id number]) [id number] Page 1 of 2 Visit Report for 05/30/2023 Arrival Information Details Patient Name: Date of Service: [name], [name] 05/30/2023 12:00 PM Medical Record Number: [id number] Patient Account Number: [id number] Date of Birth/Sex: Treating RN: 09-06-1945 (78 y.o. [name] Primary Care Adesuwa Osgood: [name] Other Clinician: Karolyn Sharper Referring Savahanna Almendariz: Treating Jamiyah Dingley/Extender: [name] Treatment: 13 Visit Information History Since Last Visit All ordered tests and consults were completed: Yes Patient Arrived: [name] Added or deleted any medications: No Arrival Time: 11:50 Any new allergies or adverse reactions: No Accompanied By: self Had a fall or experienced change in No Transfer Assistance: None activities of daily living that may affect Patient Identification Verified: Yes risk of falls: Secondary Verification Process Completed: Yes Signs or symptoms of abuse/neglect since last visito No Patient Requires Transmission-Based Precautions: No Hospitalized since last visit: No Patient Has Alerts: No Implantable device outside of the clinic excluding No cellular tissue based products placed in the center since last visit: Pain Present Now: No Electronic Signature(s) Signed: 05/30/2023 6:10:32 PM By: Karolyn Sharper CHT EMT BS , , Entered By: Karolyn Sharper on 05/30/2023 18:10:32 -------------------------------------------------------------------------------- Encounter Discharge Information Details Patient Name: Date of Service: [name], [name] 05/30/2023 12:00 PM Medical Record Number: [id number] Patient Account Number: [id number] Date of Birth/Sex: Treating RN: 1945/09/22 (78 y.o. [name] Primary Care Quamel Fitzmaurice: [facility] [place] Other Clinician: Karolyn Sharper Referring Gracin Soohoo: Treating Kaivon Livesey/Extender: [name] Treatment: 13 Encounter Discharge Information Items Discharge Condition: Stable Ambulatory Status: Walker Discharge Destination: Home Transportation: Private Auto Accompanied By: son Schedule Follow-up Appointment: No Clinical Summary of Care: Electronic Signature(s) Signed: 05/30/2023 6:18:12 PM By: Karolyn Sharper CHT EMT BS , , Entered By: Karolyn Sharper on 05/30/2023 18:18:12 [name] ([id number]) [id number] Page 2 of 2 -------------------------------------------------------------------------------- Vitals Details Patient Name: Date of Service: [name], [name] 05/30/2023 12:00 PM Medical Record Number: [id number] Patient Account Number: [id number] Date of Birth/Sex: Treating RN: May 10, 1946 (78 y.o. [name] Primary Care Quante Pettry: [name] Other Clinician: Karolyn Sharper Referring Katrinia Straker: Treating Benn Tarver/Extender: [name] Treatment: 13 Vital Signs Time Taken: 12:05 Temperature (F): 98.2 Height (in): 70 Pulse (bpm): 68 Weight (lbs): 196 Respiratory Rate (breaths/min): 18 Body Mass Index (BMI): 28.1 Blood Pressure (mmHg): 138/69 Capillary Blood Glucose (mg/dl): 802 Reference Range: 80 - 120 mg / dl Electronic Signature(s) Signed: 05/30/2023 6:10:54 PM By: Karolyn Sharper CHT EMT BS , , Entered By: Karolyn Sharper on 05/30/2023 18:10:54

## 2023-06-03 ENCOUNTER — Encounter (HOSPITAL_BASED_OUTPATIENT_CLINIC_OR_DEPARTMENT_OTHER): Payer: No Typology Code available for payment source | Admitting: Internal Medicine

## 2023-06-03 DIAGNOSIS — L97522 Non-pressure chronic ulcer of other part of left foot with fat layer exposed: Secondary | ICD-10-CM | POA: Diagnosis not present

## 2023-06-03 DIAGNOSIS — E11621 Type 2 diabetes mellitus with foot ulcer: Secondary | ICD-10-CM

## 2023-06-03 DIAGNOSIS — E1142 Type 2 diabetes mellitus with diabetic polyneuropathy: Secondary | ICD-10-CM | POA: Diagnosis not present

## 2023-06-03 DIAGNOSIS — E1151 Type 2 diabetes mellitus with diabetic peripheral angiopathy without gangrene: Secondary | ICD-10-CM | POA: Diagnosis not present

## 2023-06-03 LAB — GLUCOSE, CAPILLARY
Glucose-Capillary: 205 mg/dL — ABNORMAL HIGH (ref 70–99)
Glucose-Capillary: 190 mg/dL — ABNORMAL HIGH (ref 70–99)

## 2023-06-04 ENCOUNTER — Encounter (HOSPITAL_BASED_OUTPATIENT_CLINIC_OR_DEPARTMENT_OTHER): Payer: No Typology Code available for payment source | Admitting: Internal Medicine

## 2023-06-04 DIAGNOSIS — E1151 Type 2 diabetes mellitus with diabetic peripheral angiopathy without gangrene: Secondary | ICD-10-CM

## 2023-06-04 DIAGNOSIS — L97522 Non-pressure chronic ulcer of other part of left foot with fat layer exposed: Secondary | ICD-10-CM | POA: Diagnosis not present

## 2023-06-04 DIAGNOSIS — E11621 Type 2 diabetes mellitus with foot ulcer: Secondary | ICD-10-CM

## 2023-06-04 DIAGNOSIS — E1142 Type 2 diabetes mellitus with diabetic polyneuropathy: Secondary | ICD-10-CM

## 2023-06-04 LAB — GLUCOSE, CAPILLARY
Glucose-Capillary: 213 mg/dL — ABNORMAL HIGH (ref 70–99)
Glucose-Capillary: 175 mg/dL — ABNORMAL HIGH (ref 70–99)

## 2023-06-05 NOTE — Progress Notes (Signed)
 MEILECH, VIRTS ([id number]) [id number] Page 1 of 1 Visit Report for 06/04/2023 SuperBill Details Patient Name: Date of Service: [name], [name] 06/04/2023 Medical Record Number: [id number] Patient Account Number: [id number] Date of Birth/Sex: Treating RN: 08-Mar-1946 (78 y.o. 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 Facility Procedures CPT4 Code Description Modifier Quantity [id number] G0277-(Facility Use Only) HBOT full body chamber, , 4 ICD-10 Diagnosis Description 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 Physician Procedures Quantity CPT4 Code Description Modifier [id number] - WC PHYS HYPERBARIC OXYGEN  THERAPY 1 ICD-10 Diagnosis Description 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

## 2023-06-05 NOTE — Progress Notes (Signed)
 [name], [name] ([id number]) [id number] Page 1 of 2 Visit Report for 06/03/2023 Arrival Information Details Patient Name: Date of Service: [name], [name] 06/03/2023 12:00 PM Medical Record Number: [id number] Patient Account Number: [id number] Date of Birth/Sex: Treating RN: 09/09/1945 (78 y.o. [name] Primary Care Mansour Balboa: [name] Other Clinician: Karolyn Sharper Referring Donna Silverman: Treating Ramon Zanders/Extender: [name] in Treatment: 13 Visit Information History Since Last Visit All ordered tests and consults were completed: Yes Patient Arrived: [name] Added or deleted any medications: No Arrival Time: 11:52 Any new allergies or adverse reactions: No Accompanied By: self Had Billy fall or experienced change in No Transfer Assistance: None activities of daily living that may affect Patient Identification Verified: Yes risk of falls: Secondary Verification Process Completed: Yes Signs or symptoms of abuse/neglect since last visito No Patient Requires Transmission-Based Precautions: No Hospitalized since last visit: No Patient Has Alerts: No Implantable device outside of the clinic excluding No cellular tissue based products placed in the center since last visit: Pain Present Now: No Electronic Signature(s) Signed: 06/03/2023 3:10:04 PM By: Karolyn Sharper CHT EMT BS , , Entered By: Karolyn Sharper on 06/03/2023 15:10:04 -------------------------------------------------------------------------------- Encounter Discharge Information Details Patient Name: Date of Service: [name], [name] 06/03/2023 12:00 PM Medical Record Number: [id number] Patient Account Number: [id number] Date of Birth/Sex: Treating RN: 04-06-1981 (78 y.o. [name] Primary Care Marvelle Caudill: [name] Other Clinician: Karolyn Sharper Referring Lerae Langham: Treating Alliya Marcon/Extender: [name] in Treatment: 13 Encounter Discharge Information Items Discharge Condition: Stable Ambulatory Status: Walker Discharge Destination: Home Transportation: Private Auto Accompanied By: son Schedule Follow-up Appointment: No Clinical Summary of Care: Electronic Signature(s) Signed: 06/03/2023 3:53:46 PM By: Karolyn Sharper CHT EMT BS , , Entered By: Karolyn Sharper on 06/03/2023 15:53:46 [name], [name] ([id number]) [id number] Page 2 of 2 -------------------------------------------------------------------------------- Vitals Details Patient Name: Date of Service: [name], [name] 06/03/2023 12:00 PM Medical Record Number: [id number] Patient Account Number: [id number] Date of Birth/Sex: Treating RN: 1945-09-11 (78 y.o. [name] Primary Care Mister Krahenbuhl: [name] Other Clinician: Karolyn Sharper Referring Eaden Hettinger: Treating Ryman Rathgeber/Extender: [name] in Treatment: 13 Vital Signs Time Taken: 12:03 Temperature (F): 97.5 Height (in): 70 Pulse (bpm): 66 Weight (lbs): 196 Respiratory Rate (breaths/min): 12 Body Mass Index (BMI): 28.1 Blood Pressure (mmHg): 137/83 Capillary Blood Glucose (mg/dl): 794 Reference Range: 80 - 120 mg / dl Electronic Signature(s) Signed: 06/03/2023 3:12:44 PM By: Karolyn Sharper CHT EMT BS , , Entered By: Karolyn Sharper on 06/03/2023 15:12:44

## 2023-06-05 NOTE — Progress Notes (Signed)
 MADDOX, HLAVATY ([id number]) [id number] Page 1 of 1 Visit Report for 06/03/2023 SuperBill Details Patient Name: Date of Service: [name], [name] 06/03/2023 Medical Record Number: [id number] Patient Account Number: [id number] Date of Birth/Sex: Treating RN: 30-May-1945 (78 y.o. 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 Facility Procedures CPT4 Code Description Modifier Quantity [id number]-(Facility Use Only) HBOT full body chamber, , 4 ICD-10 Diagnosis Description 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 Physician Procedures Quantity CPT4 Code Description Modifier [id number] - WC PHYS HYPERBARIC OXYGEN  THERAPY 1 ICD-10 Diagnosis Description 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

## 2023-06-05 NOTE — Progress Notes (Signed)
 KEIDRICK, MURTY ([id number]) [id number] Page 1 of 10 Visit Report for 05/29/2023 Arrival Information Details Patient Name: Date of Service: [name], [name] 05/29/2023 2:30 PM Medical Record Number: [id number] Patient Account Number: [id number] Date of Birth/Sex: Treating RN: [birth year] (78 y.o. M) Primary Care Zadrian Mccauley: [name] Other Clinician: Referring Klayten Jolliff: Treating Dshaun Reppucci/Extender: [name] in Treatment: 13 Visit Information History Since Last Visit Added or deleted any medications: No Patient Arrived: Walker Any new allergies or adverse reactions: No Arrival Time: 14:30 Had a fall or experienced change in No Accompanied By: wife activities of daily living that may affect Transfer Assistance: None risk of falls: Patient Identification Verified: Yes Signs or symptoms of abuse/neglect since last visito No Secondary Verification Process Completed: Yes Hospitalized since last visit: No Patient Requires Transmission-Based Precautions: No Implantable device outside of the clinic excluding No Patient Has Alerts: No cellular tissue based products placed in the center since last visit: Has Dressing in Place as Prescribed: Yes Pain Present Now: No Electronic Signature(s) Signed: 05/29/2023 4:28:52 PM By: Hadassah Belt Entered By: Hadassah Belt on 05/29/2023 14:30:46 -------------------------------------------------------------------------------- Clinic Level of Care Assessment Details Patient Name: Date of Service: [name], [name] 05/29/2023 2:30 PM Medical Record Number: [id number] Patient Account Number: [id number] Date of Birth/Sex: Treating RN: Sep 28, 1945 (78 y.o. [name] Primary Care Kentarius Partington: [name] Other Clinician: Referring Monquie Fulgham: Treating Vrishank Moster/Extender: [name] in Treatment: 13 Clinic Level of Care Assessment  Items TOOL 4 Quantity Score X- 1 0 Use when only an EandM is performed on FOLLOW-UP visit ASSESSMENTS - Nursing Assessment / Reassessment X- 1 10 Reassessment of Co-morbidities (includes updates in patient status) X- 1 5 Reassessment of Adherence to Treatment Plan ASSESSMENTS - Wound and Skin A ssessment / Reassessment []  - 0 Simple Wound Assessment / Reassessment - one wound X- 1 5 Complex Wound Assessment / Reassessment - multiple wounds X- 1 10 Dermatologic / Skin Assessment (not related to wound area) ASSESSMENTS - Focused Assessment X- 1 5 Circumferential Edema Measurements - multi extremities X- 1 10 Nutritional Assessment / Counseling / Intervention RYAN, OGBORN ([id number]) [id number] Page 2 of 10 []  - 0 Lower Extremity Assessment (monofilament, tuning fork, pulses) []  - 0 Peripheral Arterial Disease Assessment (using hand held doppler) ASSESSMENTS - Ostomy and/or Continence Assessment and Care []  - 0 Incontinence Assessment and Management []  - 0 Ostomy Care Assessment and Management (repouching, etc.) PROCESS - Coordination of Care X - Simple Patient / Family Education for ongoing care 1 15 []  - 0 Complex (extensive) Patient / Family Education for ongoing care X- 1 10 Staff obtains Chiropractor, Records, T Results / Process Orders est []  - 0 Staff telephones HHA, Nursing Homes / Clarify orders / etc []  - 0 Routine Transfer to another Facility (non-emergent condition) []  - 0 Routine Hospital Admission (non-emergent condition) []  - 0 New Admissions / Manufacturing Engineer / Ordering NPWT Apligraf, etc. , []  - 0 Emergency Hospital Admission (emergent condition) X- 1 10 Simple Discharge Coordination []  - 0 Complex (extensive) Discharge Coordination PROCESS - Special Needs []  - 0 Pediatric / Minor Patient Management []  - 0 Isolation Patient Management []  - 0 Hearing / Language / Visual special needs []  - 0 Assessment of  Community assistance (transportation, D/C planning, etc.) []  - 0 Additional assistance / Altered mentation []  - 0 Support Surface(s) Assessment (bed, cushion, seat, etc.) INTERVENTIONS - Wound Cleansing / Measurement X - Simple Wound Cleansing -  one wound 1 5 []  - 0 Complex Wound Cleansing - multiple wounds X- 1 5 Wound Imaging (photographs - any number of wounds) []  - 0 Wound Tracing (instead of photographs) X- 1 5 Simple Wound Measurement - one wound []  - 0 Complex Wound Measurement - multiple wounds INTERVENTIONS - Wound Dressings X - Small Wound Dressing one or multiple wounds 1 10 []  - 0 Medium Wound Dressing one or multiple wounds []  - 0 Large Wound Dressing one or multiple wounds []  - 0 Application of Medications - topical []  - 0 Application of Medications - injection INTERVENTIONS - Miscellaneous []  - 0 External ear exam []  - 0 Specimen Collection (cultures, biopsies, blood, body fluids, etc.) []  - 0 Specimen(s) / Culture(s) sent or taken to Lab for analysis []  - 0 Patient Transfer (multiple staff / Nurse, Adult / Similar devices) []  - 0 Simple Staple / Suture removal (25 or less) []  - 0 Complex Staple / Suture removal (26 or more) []  - 0 Hypo / Hyperglycemic Management (close monitor of Blood Glucose) NASEAN, ZAPF ([id number]) [id number] Page 3 of 10 []  - 0 Ankle / Brachial Index (ABI) - do not check if billed separately X- 1 5 Vital Signs Has the patient been seen at the hospital within the last three years: Yes Total Score: 110 Level Of Care: New/Established - Level 3 Electronic Signature(s) Signed: 05/29/2023 5:28:22 PM By: Drury Nestle RN, BSN Entered By: Drury Nestle on 05/29/2023 15:42:43 -------------------------------------------------------------------------------- Encounter Discharge Information Details Patient Name: Date of Service: [name], [name] 05/29/2023 2:30 PM Medical Record Number: [id number] Patient  Account Number: [id number] Date of Birth/Sex: Treating RN: [birth year] (78 y.o. [name] Primary Care Cipriana Biller: [name] Other Clinician: Referring Namiah Dunnavant: Treating Saralyn Willison/Extender: [name] in Treatment: 13 Encounter Discharge Information Items Discharge Condition: Stable Ambulatory Status: Walker Discharge Destination: Home Transportation: Private Auto Accompanied By: wife Schedule Follow-up Appointment: Yes Clinical Summary of Care: Electronic Signature(s) Signed: 05/29/2023 5:28:22 PM By: Drury Nestle RN, BSN Entered By: Drury Nestle on 05/29/2023 15:43:15 -------------------------------------------------------------------------------- Lower Extremity Assessment Details Patient Name: Date of Service: [name], [name] 05/29/2023 2:30 PM Medical Record Number: [id number] Patient Account Number: [id number] Date of Birth/Sex: Treating RN: 1945-09-27 (78 y.o. M) Primary Care Kadeem Hyle: [name] Other Clinician: Referring Aloha Bartok: Treating Tomesha Sargent/Extender: [name] in Treatment: 13 Edema Assessment Assessed: [Left: No] [Right: No] Edema: [Left: Yes] [Right: No] Calf Left: Right: Point of Measurement: From Medial Instep 31 cm Ankle Left: Right: Point of Measurement: From Medial Instep 21.5 cm Vascular Assessment JAMIS, KRYDER ([id number]) [Right:[id number] Page 4 of 10] Extremity colors, hair growth, and conditions: Extremity Color: [Left:Normal] [Right:Normal] Hair Growth on Extremity: [Left:No] Temperature of Extremity: [Left:Warm] [Right:Warm] Capillary Refill: [Left:< 3 seconds] [Right:< 3 seconds] Dependent Rubor: [Left:No No] Electronic Signature(s) Signed: 06/03/2023 5:23:50 PM By: Wyn Iha Entered By: Wyn Iha on 05/29/2023 14:43:25 -------------------------------------------------------------------------------- Multi  Wound Chart Details Patient Name: Date of Service: [name] LD, [name] 05/29/2023 2:30 PM Medical Record Number: [id number] Patient Account Number: [id number] Date of Birth/Sex: Treating RN: [birth year] (78 y.o. M) Primary Care Faydra Korman: [name] Other Clinician: Referring Geryl Dohn: Treating Betzaira Mentel/Extender: [name] in Treatment: 13 Vital Signs Height(in): 70 Capillary Blood Glucose(mg/dl): 747 Weight(lbs): 803 Pulse(bpm): 72 Body Mass Index(BMI): 28.1 Blood Pressure(mmHg): 134/59 Temperature(F): 98.2 Respiratory Rate(breaths/min): 12 [1:Photos:] [N/A:N/A] Left, Posterior Calcaneus N/A N/A Wound Location: Blister N/A N/A Wounding Event: Diabetic Wound/Ulcer of  the Lower N/A N/A Primary Etiology: Extremity Arrhythmia, Coronary Artery Disease, N/A N/A Comorbid History: Hypertension, Peripheral Venous Disease, Type II Diabetes, Osteoarthritis, Neuropathy 01/20/2023 N/A N/A Date Acquired: 13 N/A N/A Weeks of Treatment: Open N/A N/A Wound Status: No N/A N/A Wound Recurrence: 0.9x0.5x0.4 N/A N/A Measurements L x W x D (cm) 0.353 N/A N/A A (cm) : rea 0.141 N/A N/A Volume (cm) : 79.20% N/A N/A % Reduction in A rea: 58.40% N/A N/A % Reduction in Volume: Grade 3 N/A N/A Classification: Medium N/A N/A Exudate A mount: Serosanguineous N/A N/A Exudate Type: red, brown N/A N/A Exudate Color: Distinct, outline attached N/A N/A Wound Margin: Large (67-100%) N/A N/A Granulation A mount: Red, Pink N/A N/A Granulation Quality: Small (1-33%) N/A N/A Necrotic A mount: Fat Layer (Subcutaneous Tissue): Yes N/A N/A Exposed Structures: Fascia: No Tendon: No Muscle: No Joint: No Bone: No MATISSE, SALAIS ([id number]) [id number] Page 5 of 10 Small (1-33%) N/A N/A Epithelialization: No Abnormalities Noted N/A N/A Periwound Skin Texture: Maceration: No N/A N/A Periwound Skin Moisture: Dry/Scaly:  No No Abnormalities Noted N/A N/A Periwound Skin Color: No Abnormality N/A N/A Temperature: Yes N/A N/A Tenderness on Palpation: Treatment Notes Wound #1 (Calcaneus) Wound Laterality: Left, Posterior Cleanser Wound Cleanser Discharge Instruction: Cleanse the wound with wound cleanser prior to applying a clean dressing using gauze sponges, not tissue or cotton balls. Peri-Wound Care Skin Prep Discharge Instruction: Use skin prep as directed Topical Primary Dressing Endoform 2x2 in Discharge Instruction: Moisten with saline Secondary Dressing ALLEVYN Heel 4 1/2in x 5 1/2in / 10.5cm x 13.5cm Discharge Instruction: Apply over primary dressing as directed. Drawtex 4x4 in Discharge Instruction: Apply over primary dressing cut to fit inside wound Woven Gauze Sponge, Non-Sterile 4x4 in Discharge Instruction: Apply over primary dressing as directed. Secured With [employer], 4.5x3.1 (in/yd) Discharge Instruction: Secure with Kerlix as directed. 60M Medipore H Soft Cloth Surgical T ape, 4 x 10 (in/yd) Discharge Instruction: Secure with tape as directed. Compression Wrap Compression Stockings Add-Ons Electronic Signature(s) Signed: 05/29/2023 5:26:35 PM By: [name] DO Entered By: [name] on 05/29/2023 17:16:46 -------------------------------------------------------------------------------- Multi-Disciplinary Care Plan Details Patient Name: Date of Service: [name], [name] 05/29/2023 2:30 PM Medical Record Number: [id number] Patient Account Number: [id number] Date of Birth/Sex: Treating RN: Aug 25, 1945 (78 y.o. [name] Primary Care Lorice Lafave: [name] Other Clinician: Referring Charels Stambaugh: Treating Demone Lyles/Extender: [name] in Treatment: 13 Multidisciplinary Care Plan reviewed with physician Active Inactive HBO JASIYAH, PAULDING ([id number]) [id number] Page 6 of 10 Nursing  Diagnoses: Potential for barotraumas to ears, sinuses, teeth, and lungs or cerebral gas embolism related to changes in atmospheric pressure inside hyperbaric oxygen  chamber Potential for oxygen  toxicity seizures related to delivery of 100% oxygen  at an increased atmospheric pressure Potential for pulmonary oxygen  toxicity related to delivery of 100% oxygen  at an increased atmospheric pressure Goals: Barotrauma will be prevented during HBO2 Date Initiated: 05/21/2023 T arget Resolution Date: 06/18/2023 Goal Status: Active Patient and/or family will be able to state/discuss factors appropriate to the management of their disease process during treatment Date Initiated: 05/21/2023 T arget Resolution Date: 06/18/2023 Goal Status: Active Patient will tolerate the hyperbaric oxygen  therapy treatment Date Initiated: 05/21/2023 T arget Resolution Date: 06/18/2023 Goal Status: Active Patient will tolerate the internal climate of the chamber Date Initiated: 05/21/2023 T arget Resolution Date: 06/18/2023 Goal Status: Active Patient/caregiver will verbalize understanding of HBO goals, rationale, procedures and potential hazards Date Initiated: 05/21/2023 T arget Resolution  Date: 06/18/2023 Goal Status: Active Signs and symptoms of pulmonary oxygen  toxicity will be recognized and promptly addressed Date Initiated: 05/21/2023 T arget Resolution Date: 06/18/2023 Goal Status: Active Signs and symptoms of seizure will be recognized and promptly addressed ; seizing patients will suffer no harm Date Initiated: 05/21/2023 T arget Resolution Date: 06/18/2023 Goal Status: Active Interventions: Administer decongestants, per physician orders, prior to HBO2 Administer the correct therapeutic gas delivery based on the patients needs and limitations, per physician order Assess and provide for patients comfort related to the hyperbaric environment and equalization of middle ear Assess for signs and symptoms  related to adverse events, including but not limited to confinement anxiety, pneumothorax, oxygen  toxicity and baurotrauma Assess patient for any history of confinement anxiety Assess patient's knowledge and expectations regarding hyperbaric medicine and provide education related to the hyperbaric environment, goals of treatment and prevention of adverse events Implement protocols to decrease risk of pneumothorax in high risk patients Notes: Wound/Skin Impairment Nursing Diagnoses: Knowledge deficit related to smoking impact on wound healing Goals: Patient/caregiver will verbalize understanding of skin care regimen Date Initiated: 02/26/2023 Target Resolution Date: 06/21/2023 Goal Status: Active Interventions: Assess patient/caregiver ability to obtain necessary supplies Assess patient/caregiver ability to perform ulcer/skin care regimen upon admission and as needed Assess ulceration(s) every visit Provide education on ulcer and skin care Screen for HBO Treatment Activities: Consult for HBO : 02/26/2023 Skin care regimen initiated : 02/26/2023 Topical wound management initiated : 02/26/2023 Notes: Electronic Signature(s) Signed: 05/29/2023 5:28:22 PM By: Drury Nestle RN, BSN Entered By: Drury Nestle on 05/29/2023 15:42:00 [name] ([id number]) [id number] Page 7 of 10 -------------------------------------------------------------------------------- Pain Assessment Details Patient Name: Date of Service: [name], [name] 05/29/2023 2:30 PM Medical Record Number: [id number] Patient Account Number: [id number] Date of Birth/Sex: Treating RN: 1945/07/28 (78 y.o. M) Primary Care Jemmie Ledgerwood: [name] Other Clinician: Referring Mickayla Trouten: Treating Grigor Lipschutz/Extender: [name] in Treatment: 13 Active Problems Location of Pain Severity and Description of Pain Patient Has Paino No Site Locations Pain Management and  Medication Current Pain Management: Electronic Signature(s) Signed: 05/29/2023 4:28:52 PM By: Hadassah Belt Entered By: Hadassah Belt on 05/29/2023 14:31:12 -------------------------------------------------------------------------------- Patient/Caregiver Education Details Patient Name: Date of Service: DELENA MANUAL SPANGLE, [name] 1/8/2025andnbsp2:30 PM Medical Record Number: [id number] Patient Account Number: [id number] Date of Birth/Gender: Treating RN: January 04, 1946 (78 y.o. [name] Primary Care Physician: [name] Other Clinician: Referring Physician: Treating Physician/Extender: [name] in Treatment: 13 Education Assessment Education Provided To: Patient Education Topics Provided Wound/Skin Impairment: Handouts: Caring for Your Ulcer Methods: Explain/Verbal Responses: Reinforcements needed NIJEL, FLINK ([id number]) [id number] Page 8 of 10 Electronic Signature(s) Signed: 05/29/2023 5:28:22 PM By: Drury Nestle RN, BSN Entered By: Drury Nestle on 05/29/2023 15:42:12 -------------------------------------------------------------------------------- Wound Assessment Details Patient Name: Date of Service: [name], [name] 05/29/2023 2:30 PM Medical Record Number: [id number] Patient Account Number: [id number] Date of Birth/Sex: Treating RN: 07-09-1945 (78 y.o. M) Primary Care Alaysia Lightle: [name] Other Clinician: Referring Noemie Devivo: Treating Joab Carden/Extender: [name] in Treatment: 13 Wound Status Wound Number: 1 Primary Diabetic Wound/Ulcer of the Lower Extremity Etiology: Wound Location: Left, Posterior Calcaneus Wound Open Wounding Event: Blister Status: Date Acquired: 01/20/2023 Comorbid Arrhythmia, Coronary Artery Disease, Hypertension, Peripheral Weeks Of Treatment: 13 History: Venous Disease, Type II Diabetes, Osteoarthritis,  Neuropathy Clustered Wound: No Photos Wound Measurements Length: (cm) 0.9 Width: (cm) 0.5 Depth: (cm) 0.4 Area: (cm) 0.353 Volume: (cm) 0.141 % Reduction  in Area: 79.2% % Reduction in Volume: 58.4% Epithelialization: Small (1-33%) Tunneling: No Undermining: No Wound Description Classification: Grade 3 Wound Margin: Distinct, outline attached Exudate Amount: Medium Exudate Type: Serosanguineous Exudate Color: red, brown Foul Odor After Cleansing: No Slough/Fibrino Yes Wound Bed Granulation Amount: Large (67-100%) Exposed Structure Granulation Quality: Red, Pink Fascia Exposed: No Necrotic Amount: Small (1-33%) Fat Layer (Subcutaneous Tissue) Exposed: Yes Necrotic Quality: Adherent Slough Tendon Exposed: No Muscle Exposed: No Joint Exposed: No Bone Exposed: No Periwound Skin Texture Texture Color No Abnormalities Noted: Yes No Abnormalities Noted: Yes Moisture Temperature / Pain No Abnormalities Noted: Yes Temperature: No Abnormality DYSHAUN, BONZO ([id number]) [id number] Page 9 of 10 Tenderness on Palpation: Yes Treatment Notes Wound #1 (Calcaneus) Wound Laterality: Left, Posterior Cleanser Wound Cleanser Discharge Instruction: Cleanse the wound with wound cleanser prior to applying a clean dressing using gauze sponges, not tissue or cotton balls. Peri-Wound Care Skin Prep Discharge Instruction: Use skin prep as directed Topical Primary Dressing Endoform 2x2 in Discharge Instruction: Moisten with saline Secondary Dressing ALLEVYN Heel 4 1/2in x 5 1/2in / 10.5cm x 13.5cm Discharge Instruction: Apply over primary dressing as directed. Drawtex 4x4 in Discharge Instruction: Apply over primary dressing cut to fit inside wound Woven Gauze Sponge, Non-Sterile 4x4 in Discharge Instruction: Apply over primary dressing as directed. Secured With [employer], 4.5x3.1 (in/yd) Discharge Instruction: Secure with Kerlix as  directed. 64M Medipore H Soft Cloth Surgical T ape, 4 x 10 (in/yd) Discharge Instruction: Secure with tape as directed. Compression Wrap Compression Stockings Add-Ons Electronic Signature(s) Signed: 06/03/2023 5:23:50 PM By: Wyn Iha Entered By: Wyn Iha on 05/29/2023 14:42:12 -------------------------------------------------------------------------------- Vitals Details Patient Name: Date of Service: [name], [name] 05/29/2023 2:30 PM Medical Record Number: [id number] Patient Account Number: [id number] Date of Birth/Sex: Treating RN: 1945-05-28 (78 y.o. M) Primary Care Mahi Zabriskie: [name] Other Clinician: Referring Yeraldin Litzenberger: Treating Shawntel Farnworth/Extender: [name] in Treatment: 13 Vital Signs Time Taken: 14:30 Temperature (F): 98.2 Height (in): 70 Pulse (bpm): 72 Weight (lbs): 196 Respiratory Rate (breaths/min): 12 Body Mass Index (BMI): 28.1 Blood Pressure (mmHg): 134/59 Capillary Blood Glucose (mg/dl): 747 Reference Range: 80 - 120 mg / dl Electronic Signature(s) Signed: 05/29/2023 4:28:52 PM By: Hadassah Belt Entered By: Hadassah Belt on 05/29/2023 14:31:06 [name] ([id number]) [id number] Page 10 of 10

## 2023-06-05 NOTE — Progress Notes (Signed)
 [name] ([id number]) [id number] Page 1 of 2 Visit Report for 06/04/2023 Arrival Information Details Patient Name: Date of Service: [name], [name] 06/04/2023 12:00 PM Medical Record Number: [id number] Patient Account Number: [id number] Date of Birth/Sex: Treating RN: Mar 16, 1946 (78 y.o. [name] Primary Care Adrain Butrick: [name] Other Clinician: Karolyn Sharper Referring Trindon Dorton: Treating Alese Furniss/Extender: [name] in Treatment: 14 Visit Information History Since Last Visit All ordered tests and consults were completed: Yes Patient Arrived: [name] Added or deleted any medications: No Arrival Time: 11:08 Any new allergies or adverse reactions: No Accompanied By: spouse Had a fall or experienced change in No Transfer Assistance: None activities of daily living that may affect Patient Identification Verified: Yes risk of falls: Secondary Verification Process Completed: Yes Signs or symptoms of abuse/neglect since last visito No Patient Requires Transmission-Based Precautions: No Hospitalized since last visit: No Patient Has Alerts: No Implantable device outside of the clinic excluding No cellular tissue based products placed in the center since last visit: Pain Present Now: No Electronic Signature(s) Signed: 06/04/2023 2:12:34 PM By: Karolyn Sharper CHT EMT BS , , Entered By: Karolyn Sharper on 06/04/2023 14:12:34 -------------------------------------------------------------------------------- Encounter Discharge Information Details Patient Name: Date of Service: [name], [name] 06/04/2023 12:00 PM Medical Record Number: [id number] Patient Account Number: [id number] Date of Birth/Sex: Treating RN: 1946/01/11 (78 y.o. [name] Primary Care Janecia Palau: [name] Other Clinician: Karolyn Sharper Referring Ahni Bradwell: Treating Jumanah Hynson/Extender: [name] in Treatment: 14 Encounter Discharge Information Items Discharge Condition: Stable Ambulatory Status: Walker Discharge Destination: Home Transportation: Private Auto Accompanied By: spouse Schedule Follow-up Appointment: No Clinical Summary of Care: Electronic Signature(s) Signed: 06/04/2023 2:21:10 PM By: Karolyn Sharper CHT EMT BS , , Entered By: Karolyn Sharper on 06/04/2023 14:21:10 [name] ([id number]) [id number] Page 2 of 2 -------------------------------------------------------------------------------- Vitals Details Patient Name: Date of Service: [name], [name] 06/04/2023 12:00 PM Medical Record Number: [id number] Patient Account Number: [id number] Date of Birth/Sex: Treating RN: 21-Nov-1945 (78 y.o. [name] Primary Care [name]: [name] Other Clinician: Karolyn Sharper Referring [name]: Treating Kathlyn Leachman/Extender: [name] in Treatment: 14 Vital Signs Time Taken: 11:29 Temperature (F): 97.8 Height (in): 70 Pulse (bpm): 80 Weight (lbs): 196 Respiratory Rate (breaths/min): 18 Body Mass Index (BMI): 28.1 Blood Pressure (mmHg): 134/73 Capillary Blood Glucose (mg/dl): 786 Reference Range: 80 - 120 mg / dl Electronic Signature(s) Signed: 06/04/2023 2:12:58 PM By: Karolyn Sharper CHT EMT BS , , Entered By: Karolyn Sharper on 06/04/2023 14:12:57

## 2023-06-05 NOTE — Progress Notes (Signed)
 [name], [name] ([id number]) [id number] Page 1 of 1 Visit Report for 05/30/2023 SuperBill Details Patient Name: Date of Service: [name], [name] 05/30/2023 Medical Record Number: [id number] Patient Account Number: [id number] Date of Birth/Sex: Treating RN: 1946-04-08 (78 y.o. 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 Facility Procedures CPT4 Code Description Modifier Quantity [id number]-(Facility Use Only) HBOT full body chamber, , 4 ICD-10 Diagnosis Description 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 Physician Procedures Quantity CPT4 Code Description Modifier [id number] - WC PHYS HYPERBARIC OXYGEN  THERAPY 1 ICD-10 Diagnosis Description 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

## 2023-06-05 NOTE — Progress Notes (Addendum)
 ANDRIK, SANDT ([id number]) [id number] Page 1 of 2 Visit Report for 06/03/2023 HBO Details Patient Name: Date of Service: [name], [name] 06/03/2023 12:00 PM Medical Record Number: [id number] Patient Account Number: [id number] Date of Birth/Sex: Treating RN: Dec 24, 1945 (78 y.o. [name] Primary Care Zacaria Pousson: [name] Other Clinician: [name] Referring [name]: Treating [name]: [name] Duos in Treatment: 13 HBO Treatment Course Details Treatment Course Number: 1 Ordering [name]: [name] T Treatments Ordered: otal 40 HBO Treatment Start Date: 03/11/2023 HBO Indication: Diabetic Ulcer(s) of the Lower Extremity Standard/Conservative Wound Care tried and failed greater than or equal to 30 days Wound #1 Left, Posterior Calcaneus , Wound #2 Right, Posterior Calcaneus HBO Treatment Details Treatment Number: 39 Patient Type: Outpatient Chamber Type: Monoplace Chamber Serial #: [id number] Treatment Protocol: 2.0 ATA with 90 minutes oxygen , and no air breaks Treatment Details Compression Rate Down: 1.5 psi / minute De-Compression Rate Up: 2.0 psi / minute Air breaks and breathing Decompress Decompress Compress Tx Pressure Begins Reached periods Begins Ends (leave unused spaces blank) Chamber Pressure (ATA 1 2 ------2 1 ) Clock Time (24 hr) 12:28 12:38 - - - - - - 14:08 14:20 Treatment Length: 112 (minutes) Treatment Segments: 4 Vital Signs Capillary Blood Glucose Reference Range: 80 - 120 mg / dl HBO Diabetic Blood Glucose Intervention Range: <131 mg/dl or >750 mg/dl Type: Time Vitals Blood Respiratory Capillary Blood Glucose Pulse Action Pulse: Temperature: Taken: Pressure: Rate: Glucose (mg/dl): Meter #: Oximetry (%) Taken: Pre 12:03 137/83 66 12 97.5 205 none per protocol Post 14:23 147/60 62 12 98 190 none per protocol Treatment Response Treatment  Toleration: Well Treatment Completion Status: Treatment Completed without Adverse Event Treatment Notes [name] arrived with normal vital signs. He stated that he did eat breakfast and content of meal was appropriate for treatment. He prepared for treatment. After performing Billy safety check, the patient was placed in the chamber which was compressed with 100% oxygen  at the rate of 2 psi/min after confirming normal ear equalization. He tolerated the treatment and the subsequent decompression of the chamber at the rate of 2 psi/min. He denied any issues with ear equalization and/or pain related to barotrauma. His post-treatment vital signs were within normal range. He was stable upon discharge with his son. Additional Procedure Documentation Tissue [name] Physician HBO Attestation: I certify that I supervised this HBO treatment in accordance with Medicare guidelines. Billy trained emergency response team is readily available per [facility] hospital policies and procedures. Continue HBOT as ordered. [address] DIMITRIS, SHANAHAN ([id number]) [id number] Page 2 of 2 Electronic Signature(s) Signed: 06/04/2023 12:05:44 PM By: Billy Raisin DO Previous Signature: 06/03/2023 3:52:55 PM Version By: [name] CHT EMT BS , , Entered By: [name] on 06/04/2023 12:05:26 -------------------------------------------------------------------------------- HBO Safety Checklist Details Patient Name: Date of Service: [name], [name] 06/03/2023 12:00 PM Medical Record Number: [id number] Patient Account Number: [id number] Date of Birth/Sex: Treating RN: 05/19/1946 (78 y.o. [name] Primary Care Kristianna Saperstein: [name] Other Clinician: [name] Referring [name]: Treating Alya Smaltz/Extender: [name] [name] Duos in Treatment: 13 HBO Safety Checklist Items Safety Checklist Consent Form Signed Patient voided / foley  secured and emptied 0900 - 2 egg sausage, 2 HB, 2 slice whole When did you last eato wheat bread Last dose of injectable or oral agent 0830 - Metformin Ostomy pouch emptied and vented if applicable NA All implantable devices assessed, documented and approved NA Intravenous access  site secured and place NA Valuables secured Linens and cotton and cotton/polyester blend (less than 51% polyester) Personal oil-based products / skin lotions / body lotions removed Wigs or hairpieces removed NA Smoking or tobacco materials removed NA Books / newspapers / magazines / loose paper removed Cologne, aftershave, perfume and deodorant removed Jewelry removed (may wrap wedding band) Make-up removed NA Hair care products removed Battery operated devices (external) removed Heating patches and chemical warmers removed Titanium eyewear removed Nail polish cured greater than 10 hours NA Casting material cured greater than 10 hours NA Hearing aids removed at home Loose dentures or partials removed left at home Prosthetics have been removed NA Patient demonstrates correct use of air break device (if applicable) Patient concerns have been addressed Patient grounding bracelet on and cord attached to chamber Specifics for Inpatients (complete in addition to above) Medication sheet sent with patient NA Intravenous medications needed or due during therapy sent with patient NA Drainage tubes (e.g. nasogastric tube or chest tube secured and vented) NA Endotracheal or Tracheotomy tube secured NA Cuff deflated of air and inflated with saline NA Airway suctioned NA Notes Paper version used prior to treatment start. Electronic Signature(s) Signed: 06/03/2023 3:14:34 PM By: [name] CHT EMT BS , , Entered By: [name] on 06/03/2023 15:14:34

## 2023-06-05 NOTE — Progress Notes (Signed)
 [name], [name] ([id number]) [id number] Page 1 of 2 Visit Report for 06/04/2023 HBO Details Patient Name: Date of Service: A [name], [name] 06/04/2023 12:00 PM Medical Record Number: [id number] Patient Account Number: [id number] Date of Birth/Sex: Treating RN: 04/25/1946 (78 y.o. [name] Primary Care Conley Delisle: [name] Other Clinician: Karolyn Sharper Referring Jensen Kilburg: Treating Adwoa Axe/Extender: [name] [name] Duos in Treatment: 14 HBO Treatment Course Details Treatment Course Number: 1 Ordering Mansi Tokar: Rufus Sharper T Treatments Ordered: otal 40 HBO Treatment Start Date: 03/11/2023 HBO Indication: Diabetic Ulcer(s) of the Lower Extremity Standard/Conservative Wound Care tried and failed greater than or equal to 30 days Wound #1 Left, Posterior Calcaneus , Wound #2 Right, Posterior Calcaneus HBO Treatment Details Treatment Number: 40 Patient Type: Outpatient Chamber Type: Monoplace Chamber Serial #: [id number] Treatment Protocol: 2.0 ATA with 90 minutes oxygen , and no air breaks Treatment Details Compression Rate Down: 1.5 psi / minute De-Compression Rate Up: 2.0 psi / minute Air breaks and breathing Decompress Decompress Compress Tx Pressure Begins Reached periods Begins Ends (leave unused spaces blank) Chamber Pressure (ATA 1 2 ------2 1 ) Clock Time (24 hr) 11:34 11:43 - - - - - - 13:13 13:20 Treatment Length: 106 (minutes) Treatment Segments: 4 Vital Signs Capillary Blood Glucose Reference Range: 80 - 120 mg / dl HBO Diabetic Blood Glucose Intervention Range: <131 mg/dl or >750 mg/dl Type: Time Vitals Blood Respiratory Capillary Blood Glucose Pulse Action Pulse: Temperature: Taken: Pressure: Rate: Glucose (mg/dl): Meter #: Oximetry (%) Taken: Pre 11:29 134/73 80 18 97.8 213 none per protocol Post 13:25 138/64 62 18 98.2 175 Treatment Response Treatment Toleration:  Well Treatment Completion Status: Treatment Completed without Adverse Event Treatment Notes Mr. Bohr arrived with normal vital signs. He stated that he did eat breakfast and content of meal was appropriate for treatment. He prepared for treatment. After performing a safety check, the patient was placed in the chamber which was compressed with 100% oxygen  at the rate of 2 psi/min after confirming normal ear equalization. He tolerated the treatment and the subsequent decompression of the chamber at the rate of 2 psi/min. He denied any issues with ear equalization and/or pain related to barotrauma. His post-treatment vital signs were within normal range. He was stable upon discharge with his wife. Additional Procedure Documentation Tissue [name] Physician HBO Attestation: I certify that I supervised this HBO treatment in accordance with Medicare guidelines. A trained emergency response team is readily available per [facility] hospital policies and procedures. Continue HBOT as ordered. [address] KOICHI, PLATTE ([id number]) [id number] Page 2 of 2 Electronic Signature(s) Signed: 06/04/2023 3:39:41 PM By: [name] DO Previous Signature: 06/04/2023 2:20:15 PM Version By: Karolyn Sharper CHT EMT BS , , Entered By: [name] on 06/04/2023 15:39:27 -------------------------------------------------------------------------------- HBO Safety Checklist Details Patient Name: Date of Service: A [name], [name] 06/04/2023 12:00 PM Medical Record Number: [id number] Patient Account Number: [id number] Date of Birth/Sex: Treating RN: 12-Nov-1945 (78 y.o. [name] Primary Care Nadya Hopwood: [name] Other Clinician: Karolyn Sharper Referring Kalli Greenfield: Treating Tarick Parenteau/Extender: [name] [name] Duos in Treatment: 14 HBO Safety Checklist Items Safety Checklist Consent Form Signed Patient voided / foley secured and  emptied 1015 - HB, grits, 2 eggs, 3 bacon, biscuit, When did you last eato coffee. Last dose of injectable or oral agent 0915 - Metformin Ostomy pouch emptied and vented if applicable NA All implantable devices assessed, documented and approved NA Intravenous access site secured and place NA  Valuables secured Linens and cotton and cotton/polyester blend (less than 51% polyester) Personal oil-based products / skin lotions / body lotions removed Wigs or hairpieces removed NA Smoking or tobacco materials removed NA Books / newspapers / magazines / loose paper removed Cologne, aftershave, perfume and deodorant removed Jewelry removed (may wrap wedding band) Make-up removed NA Hair care products removed Battery operated devices (external) removed Heating patches and chemical warmers removed Titanium eyewear removed Nail polish cured greater than 10 hours NA Casting material cured greater than 10 hours NA Hearing aids removed at home Loose dentures or partials removed dentures removed Prosthetics have been removed NA Patient demonstrates correct use of air break device (if applicable) Patient concerns have been addressed Patient grounding bracelet on and cord attached to chamber Specifics for Inpatients (complete in addition to above) Medication sheet sent with patient NA Intravenous medications needed or due during therapy sent with patient NA Drainage tubes (e.g. nasogastric tube or chest tube secured and vented) NA Endotracheal or Tracheotomy tube secured NA Cuff deflated of air and inflated with saline NA Airway suctioned NA Notes Paper version used prior to treatment start. Electronic Signature(s) Signed: 06/04/2023 2:14:51 PM By: Karolyn Sharper CHT EMT BS , , Entered By: Karolyn Sharper on 06/04/2023 14:14:50

## 2023-06-06 ENCOUNTER — Ambulatory Visit (HOSPITAL_BASED_OUTPATIENT_CLINIC_OR_DEPARTMENT_OTHER): Payer: No Typology Code available for payment source | Admitting: General Surgery

## 2023-06-06 ENCOUNTER — Encounter (HOSPITAL_BASED_OUTPATIENT_CLINIC_OR_DEPARTMENT_OTHER): Payer: No Typology Code available for payment source | Admitting: Internal Medicine

## 2023-06-06 DIAGNOSIS — L97522 Non-pressure chronic ulcer of other part of left foot with fat layer exposed: Secondary | ICD-10-CM

## 2023-06-06 DIAGNOSIS — E11621 Type 2 diabetes mellitus with foot ulcer: Secondary | ICD-10-CM | POA: Diagnosis not present

## 2023-06-06 DIAGNOSIS — E1142 Type 2 diabetes mellitus with diabetic polyneuropathy: Secondary | ICD-10-CM

## 2023-06-06 NOTE — Progress Notes (Signed)
[name], [name] ([id number]) [id number] Page 1 of 9 Visit Report for 06/06/2023 Chief Complaint Document Details Patient Name: Date of Service: [name], [name] 06/06/2023 11:00 A M Medical Record Number: [id number] Patient Account Number: [id number] Date of Birth/Sex: Treating RN: 1945/11/19 (78 y.o. M) Primary Care Provider: [name] Other Clinician: Referring Provider: Treating Provider/Extender: [facility], [place] in Treatment: 14 Information Obtained from: Patient Chief Complaint 02/26/2023. Patient is here for review of wounds on his bilateral heel tips Electronic Signature(s) Signed: 06/06/2023 4:08:41 PM By: Geralyn Corwin DO Entered By: Geralyn Corwin on 06/06/2023 12:20:49 -------------------------------------------------------------------------------- Debridement Details Patient Name: Date of Service: [name], [name] 06/06/2023 11:00 A M Medical Record Number: [id number] Patient Account Number: [id number] Date of Birth/Sex: Treating RN: [birth year] (78 y.o. Cline Cools Primary Care Provider: [name] Other Clinician: Referring Provider: Treating Provider/Extender: [facility], [place] in Treatment: 14 Debridement Performed for Assessment: Wound #1 Left,Posterior Calcaneus Performed By: Physician Geralyn Corwin, DO The following information was scribed by: Redmond Pulling The information was scribed for: Geralyn Corwin Debridement Type: Debridement Severity of Tissue Pre Debridement: Fat layer exposed Level of Consciousness (Pre-procedure): Awake and Alert Pre-procedure Verification/Time Out Yes - 11:35 Taken: Start Time: 11:35 Pain Control: Lidocaine 5% topical ointment Percent of Wound Bed Debrided: 100% T Area Debrided (cm): otal 1.57 Tissue and other material debrided: Non-Viable, Slough, Slough Level: Non-Viable Tissue Debridement Description:  Selective/Open Wound Instrument: Curette Bleeding: Minimum Hemostasis Achieved: Pressure Response to Treatment: Procedure was tolerated well Level of Consciousness (Post- Awake and Alert procedure): Post Debridement Measurements of Total Wound Length: (cm) 5 Width: (cm) 0.4 Depth: (cm) 0.2 Volume: (cm) 0.314 Character of Wound/Ulcer Post Debridement: Improved Severity of Tissue Post Debridement: Fat layer exposed [name], [name] ([id number]) [id number] Page 2 of 9 Post Procedure Diagnosis Same as Pre-procedure Electronic Signature(s) Signed: 06/06/2023 4:08:41 PM By: Geralyn Corwin DO Signed: 06/06/2023 4:39:29 PM By: Redmond Pulling RN, BSN Entered By: Redmond Pulling on 06/06/2023 11:36:50 -------------------------------------------------------------------------------- HPI Details Patient Name: Date of Service: [name], [name] 06/06/2023 11:00 A M Medical Record Number: [id number] Patient Account Number: [id number] Date of Birth/Sex: Treating RN: 14-Sep-1945 (78 y.o. M) Primary Care Provider: [name] Other Clinician: Referring Provider: Treating Provider/Extender: [facility], [place] in Treatment: 14 History of Present Illness HPI Description: ADMISSION 02/26/2023 Patient is a 78 year old man with type 2 diabetes. He has had a 51-month history of wounds on his bilateral heels just above the heel tips. They have been using Iodoflex/Iodosorb ointment. The patient has a history of PAD and has a SFA stent on the right. He also also status post left iliac stent. Furthermore he has a history of carotid artery stenosis status post right carotid endarterectomy and a left subclavian artery stenosis. His wound started on his blisters on the heel that of open to wounds with minimal but some depth right greater than left. He also has advanced diabetic neuropathy. The patient is cared for through the [place] and has been referred  here for hyperbaric oxygen treatment through his vascular surgeon. Past medical history includes hypertension, hyperlipidemia, type 2 diabetes chronic kidney disease stage IIIb, carotid artery stenosis status post right carotid endarterectomy, left subclavian artery stenosis ABIs done in our clinic were 1.11 on the right and 0.75 on the left these were remarkably similar to what was quoted during the vascular surgery appointment from 02/05/2023. 10/14; this is a patient with [name] diabetic foot  ulcers on both heels just above the heel tips. He was referred for hyperbaric oxygen by the [place]. His chest x-ray I think shows vascular crowding but no pneumothorax. He has been using Iodoflex on the wound with reasonably good improvement this week. He sees his ENT in 2 days and then they will make a decision about when they want to start HBO 10/24; patient is started HBO I believe he is on treatment 5 today. He had some problems with his right ear on the second dive however he has had no problems since and says he feels well. He slept through treatment yesterday. He has been using Iodosorb ointment on the wounds on both heels for a long period even before he came here. Unfortunately the wound surface still does not look completely as viable as I would like. 11/5; he continues to tolerate HBO fairly well although he is having some fullness in his left ear that is reversed by popping his left ear. He has been using Sorbact hydrogel. He comes in today with the wounds quite a bit smaller especially on the right but also on the left. He is fairly rigorous and offloading these with heel offloading shoes. 11/11; right posterior calcaneus is closed down. The area on the left calcaneus laterally still has some depth. We have been using Sorbact on both sides. He has completed 12 out of 40 treatments 11/19; right posterior calcaneus is still closed the area on the left is quite a bit smaller. We have been using Sorbact  on the left heel. He is still receiving HBO which was requested by the [place]. These wounds may be healed soon 12/3; the only remaining wound is the left posterior calcaneus and it is smaller. We've been using sore act. He is continuing HBO. On presentation he had a similar wound on the right posterior heel which is healed two or three weeks ago. 12/10; left posterior lateral calcaneus. Unfortunately no improvement today. Using Sorbact and hydrogel her intake nurses saying that the Sorbact was not adherent to the wound. He is continuing in HBO 12/17; left posterior lateral calcaneus. Small punched-out area remains. Unfortunately cannot continue HBO for the foreseeable future his wife had a fall with multiple rib fractures and had to be admitted the hospital. We are using endoform to this wound. The right heel heel sometime ago The patient is complaining of some pain in the left foot. No predictable claudication however. He does have a history of PAD and has a left iliac artery stent. ABI in our clinic was 0.75 05/21/2023: He has a new blister on his right posterior heel. The blister is intact and the fluid is clear. He admits to being noncompliant with Prevalon boot use in bed and thinks he probably rubbed it on the sheets. The left heel ulcer has a fair amount of slough and some breakdown of the skin immediately adjacent to the 1 o'clock position. He has resumed hyperbaric oxygen therapy. 05/29/2023; patient's right posterior heel blister is still intact and mostly dried. His remaining wound is to the left heel wound. He is completing hyperbaric oxygen therapy for work [employer] grade 2 and is tolerating this well. 06/06/2023; patient's right posterior heel blister has dried. No open wound here. T the left heel wound patient's been using endoform. We are trying to extend his o hyperbaric oxygen therapy as he has benefited greatly from this. We are awaiting approval by the [place]. He has no issues or complaints  today. He denies signs  LD, EPLIN ([id number]) [id number] Page 3 of 9 of infection. Electronic Signature(s) Signed: 06/06/2023 4:08:41 PM By: Geralyn Corwin DO Entered By: Geralyn Corwin on 06/06/2023 12:21:45 -------------------------------------------------------------------------------- Physical Exam Details Patient Name: Date of Service: [name], [name] 06/06/2023 11:00 A M Medical Record Number: [id number] Patient Account Number: [id number] Date of Birth/Sex: Treating RN: [birth year] (78 y.o. M) Primary Care Provider: [name] Other Clinician: Referring Provider: Treating Provider/Extender: [facility], [place] in Treatment: 14 Constitutional respirations regular, non-labored and within target range for patient.. Cardiovascular 2+ dorsalis pedis/posterior tibialis pulses. Psychiatric pleasant and cooperative. Notes Intact blister on the right posterior heel that is dried. T the left heel there is an open wound with granulation tissue and slough. No signs of surrounding o infection. Electronic Signature(s) Signed: 06/06/2023 4:08:41 PM By: Geralyn Corwin DO Entered By: Geralyn Corwin on 06/06/2023 12:38:41 -------------------------------------------------------------------------------- Physician Orders Details Patient Name: Date of Service: [name], [name] 06/06/2023 11:00 A M Medical Record Number: [id number] Patient Account Number: [id number] Date of Birth/Sex: Treating RN: [birth year] (78 y.o. Cline Cools Primary Care Provider: [name] Other Clinician: Referring Provider: Treating Provider/Extender: [facility], [place] in Treatment: [phone] Verbal / Phone Orders: No Diagnosis Coding Follow-up Appointments ppointment in 1 week. - Dr. Mikey Bussing - schedule for patient Return A ppointment in 2 weeks. - Dr. Mikey Bussing schedule for patient Return A Return  appointment in 3 weeks. - Dr. Mikey Bussing schedule for patient Other: - Once wounds close will discontinue hyperbarics. will request from [place] more hyperbaric oxygen therapy. Anesthetic (In clinic) Topical Lidocaine 4% applied to wound bed - In clinic Bathing/ Shower/ Hygiene May shower and wash wound with soap and water. - with dressing changes Off-Loading Wound #1 Left,Posterior Calcaneus [name], [name] ([id number]) [id number] Page 4 of 9 Wedge shoe to: - Off loading shoes bilaterally to alleviate pressure from heels. Turn and reposition every 2 hours Prevalon Boot - bilaterally at bedtime Non Wound Condition Right Lower Extremity pply the following to affected area as directed: - bordered foam as needed for protection on right foot. A Hyperbaric Oxygen Therapy Wound #1 Left,Posterior Calcaneus Evaluate for HBO Therapy - Per patient, he has claustrophobia. If appropriate for treatment, begin HBOT per protocol: 2.0 ATA for 90 Minutes without A Breaks ir Total Number of Treatments: - 40 05/29/2023: additional 20 more treatments One treatments per day (delivered Monday through Friday unless otherwise specified in Special Instructions below): Finger stick Blood Glucose Pre- and Post- HBOT Treatment. Follow Hyperbaric Oxygen Glycemia Protocol Give two 4oz orange juices in addition to Glucerna when the glycemic protocol is used. Afrin (Oxymetazoline HCL) 0.05% nasal spray - 1 spray in both nostrils daily as needed prior to HBO treatment for difficulty clearing ears Wound Treatment Wound #1 - Calcaneus Wound Laterality: Left, Posterior Cleanser: Wound Cleanser Every Other Day/30 Days Discharge Instructions: Cleanse the wound with wound cleanser prior to applying a clean dressing using gauze sponges, not tissue or cotton balls. Peri-Wound Care: Skin Prep Every Other Day/30 Days Discharge Instructions: Use skin prep as directed Prim Dressing: Endoform 2x2 in Every  Other Day/30 Days ary Discharge Instructions: Moisten with saline Secondary Dressing: ALLEVYN Heel 4 1/2in x 5 1/2in / 10.5cm x 13.5cm Every Other Day/30 Days Discharge Instructions: Apply over primary dressing as directed. Secondary Dressing: Drawtex 4x4 in Every Other Day/30 Days Discharge Instructions: Apply over primary dressing cut to fit inside wound Secondary Dressing: Woven Gauze Sponge, Non-Sterile 4x4 in Every Other  Day/30 Days Discharge Instructions: Apply over primary dressing as directed. Secured With: [employer], 4.5x3.1 (in/yd) Every Other Day/30 Days Discharge Instructions: Secure with Kerlix as directed. Secured With: 63M Medipore H Soft Cloth Surgical T ape, 4 x 10 (in/yd) Every Other Day/30 Days Discharge Instructions: Secure with tape as directed. GLYCEMIA INTERVENTIONS PROTOCOL PRE-HBO GLYCEMIA INTERVENTIONS ACTION INTERVENTION Obtain pre-HBO capillary blood glucose (ensure 1 physician order is in chart). A. Notify HBO physician and await physician orders. 2 If result is 70 mg/dl or below: B. If the result meets the hospital definition of a critical result, follow hospital policy. A. Give patient an 8 ounce Glucerna Shake, an 8 ounce Ensure, or 8 ounces of a Glucerna/Ensure equivalent dietary supplement*. B. Wait 30 minutes. If result is 71 mg/dl to 725 mg/dl: C. Retest patients capillary blood glucose (CBG). D. If result greater than or equal to 110 mg/dl, proceed with HBO. If result less than 110 mg/dl, notify HBO physician and consider holding HBO. If result is 131 mg/dl to 366 mg/dl: A. Proceed with HBO. A. Notify HBO physician and await physician orders. B. It is recommended to hold HBO and do If result is 250 mg/dl or greater: blood/urine ketone testing. C. If the result meets the hospital definition of a critical result, follow hospital policy. POST-HBO GLYCEMIA INTERVENTIONS ACTION INTERVENTION Obtain post HBO capillary blood glucose  (ensure 1 physician order is in chart). A. Notify HBO physician and await physician orders. 2 If result is 70 mg/dl or below: [name], [name] ([id number]) [id number] Page 5 of 9 2 If result is 70 mg/dl or below: B. If the result meets the hospital definition of a critical result, follow hospital policy. A. Give patient an 8 ounce Glucerna Shake, an 8 ounce Ensure, or 8 ounces of a Glucerna/Ensure equivalent dietary supplement*. B. Wait 15 minutes for symptoms of If result is 71 mg/dl to 956 mg/dl: hypoglycemia (i.e. nervousness, anxiety, sweating, chills, clamminess, irritability, confusion, tachycardia or dizziness). C. If patient asymptomatic, discharge patient. If patient symptomatic, repeat capillary blood glucose (CBG) and notify HBO physician. If result is 101 mg/dl to 387 mg/dl: A. Discharge patient. A. Notify HBO physician and await physician orders. B. It is recommended to do blood/urine ketone If result is 250 mg/dl or greater: testing. C. If the result meets the hospital definition of a critical result, follow hospital policy. *Juice or candies are NOT equivalent products. If patient refuses the Glucerna or Ensure, please consult the hospital dietitian for an appropriate substitute. Electronic Signature(s) Signed: 06/06/2023 4:08:41 PM By: Geralyn Corwin DO Entered By: Geralyn Corwin on 06/06/2023 12:38:53 -------------------------------------------------------------------------------- Problem List Details Patient Name: Date of Service: [name], [name] 06/06/2023 11:00 A M Medical Record Number: [id number] Patient Account Number: [id number] Date of Birth/Sex: Treating RN: [birth year] (78 y.o. M) Primary Care Provider: [name] Other Clinician: Referring Provider: Treating Provider/Extender: [facility], [place] in Treatment: 14 Active Problems ICD-10 Encounter Code Description Active Date  MDM Diagnosis E11.621 Type 2 diabetes mellitus with foot ulcer 02/26/2023 No Yes L97.522 Non-pressure chronic ulcer of other part of left foot with fat layer exposed 05/29/2023 No Yes E11.51 Type 2 diabetes mellitus with diabetic peripheral angiopathy without gangrene 02/26/2023 No Yes E11.42 Type 2 diabetes mellitus with diabetic polyneuropathy 02/26/2023 No Yes Inactive Problems ICD-10 Code Description Active Date Inactive Date 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 02/26/2023 02/26/2023 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 with  other specified severity 02/26/2023 02/26/2023 L97.521 Non-pressure chronic ulcer of other part of left foot limited to breakdown of skin 05/07/2023 05/07/2023 [name], [name] ([id number]) [id number] Page 6 of 9 Resolved Problems Electronic Signature(s) Signed: 06/06/2023 4:08:41 PM By: Geralyn Corwin DO Entered By: Geralyn Corwin on 06/06/2023 12:18:35 -------------------------------------------------------------------------------- Progress Note Details Patient Name: Date of Service: [name], [name] 06/06/2023 11:00 A M Medical Record Number: [id number] Patient Account Number: [id number] Date of Birth/Sex: Treating RN: 04-03-1946 (78 y.o. M) Primary Care Provider: [name] Other Clinician: Referring Provider: Treating Provider/Extender: [facility], [place] in Treatment: 14 Subjective Chief Complaint Information obtained from Patient 02/26/2023. Patient is here for review of wounds on his bilateral heel tips History of Present Illness (HPI) ADMISSION 02/26/2023 Patient is a 78 year old man with type 2 diabetes. He has had a 26-month history of wounds on his bilateral heels just above the heel tips. They have been using Iodoflex/Iodosorb ointment. The patient has a history of PAD and has a SFA stent on the right. He also  also status post left iliac stent. Furthermore he has a history of carotid artery stenosis status post right carotid endarterectomy and a left subclavian artery stenosis. His wound started on his blisters on the heel that of open to wounds with minimal but some depth right greater than left. He also has advanced diabetic neuropathy. The patient is cared for through the [place] and has been referred here for hyperbaric oxygen treatment through his vascular surgeon. Past medical history includes hypertension, hyperlipidemia, type 2 diabetes chronic kidney disease stage IIIb, carotid artery stenosis status post right carotid endarterectomy, left subclavian artery stenosis ABIs done in our clinic were 1.11 on the right and 0.75 on the left these were remarkably similar to what was quoted during the vascular surgery appointment from 02/05/2023. 10/14; this is a patient with [name] diabetic foot ulcers on both heels just above the heel tips. He was referred for hyperbaric oxygen by the [place]. His chest x-ray I think shows vascular crowding but no pneumothorax. He has been using Iodoflex on the wound with reasonably good improvement this week. He sees his ENT in 2 days and then they will make a decision about when they want to start HBO 10/24; patient is started HBO I believe he is on treatment 5 today. He had some problems with his right ear on the second dive however he has had no problems since and says he feels well. He slept through treatment yesterday. He has been using Iodosorb ointment on the wounds on both heels for a long period even before he came here. Unfortunately the wound surface still does not look completely as viable as I would like. 11/5; he continues to tolerate HBO fairly well although he is having some fullness in his left ear that is reversed by popping his left ear. He has been using Sorbact hydrogel. He comes in today with the wounds quite a bit smaller especially on the right but also on  the left. He is fairly rigorous and offloading these with heel offloading shoes. 11/11; right posterior calcaneus is closed down. The area on the left calcaneus laterally still has some depth. We have been using Sorbact on both sides. He has completed 12 out of 40 treatments 11/19; right posterior calcaneus is still closed the area on the left is quite a bit smaller. We have been using Sorbact on the left heel. He is still receiving [facility] These wounds may be healed soon 12/3; the only remaining wound is the left posterior calcaneus and it is smaller. We've been using sore act. He is continuing HBO. On presentation he had a similar wound on the right posterior heel which is healed two or three weeks ago. 12/10; left posterior lateral calcaneus. Unfortunately no improvement today. Using Sorbact and hydrogel her intake nurses saying that the Sorbact was not adherent to the wound. He is continuing in HBO 12/17; left posterior lateral calcaneus. Small punched-out area remains. Unfortunately cannot continue HBO for the foreseeable future his wife had a fall with multiple rib fractures and had to be admitted the hospital. We are using endoform to this wound. The right heel heel sometime ago The patient is complaining of some pain in the left foot. No predictable claudication however. He does have a history of PAD and has a left iliac artery stent. ABI in our clinic was 0.75 05/21/2023: He has a new blister on his right posterior heel. The blister is intact and the fluid is clear. He admits to being noncompliant with Prevalon boot use in bed and thinks he probably rubbed it on the sheets. The left heel ulcer has a fair amount of slough and some breakdown of the skin immediately adjacent to the 1 o'clock position. He has resumed hyperbaric oxygen therapy. [name], [name] ([id number]) [id number] Page 7 of 9 05/29/2023; patient's right posterior heel  blister is still intact and mostly dried. His remaining wound is to the left heel wound. He is completing hyperbaric oxygen therapy for work [employer] grade 2 and is tolerating this well. 06/06/2023; patient's right posterior heel blister has dried. No open wound here. T the left heel wound patient's been using endoform. We are trying to extend his o hyperbaric oxygen therapy as he has benefited greatly from this. We are awaiting approval by the [place]. He has no issues or complaints today. He denies signs of infection. Objective Constitutional respirations regular, non-labored and within target range for patient.. Vitals Time Taken: 11:14 AM, Height: 70 in, Weight: 196 lbs, BMI: 28.1, Temperature: 97.8 F, Pulse: 58 bpm, Respiratory Rate: 18 breaths/min, Blood Pressure: 151/64 mmHg, Capillary Blood Glucose: 171 mg/dl. Cardiovascular 2+ dorsalis pedis/posterior tibialis pulses. Psychiatric pleasant and cooperative. General Notes: Intact blister on the right posterior heel that is dried. T the left heel there is an open wound with granulation tissue and slough. No signs of o surrounding infection. Integumentary (Hair, Skin) Wound #1 status is Open. Original cause of wound was Blister. The date acquired was: 01/20/2023. The wound has been in treatment 14 weeks. The wound is located on the Left,Posterior Calcaneus. The wound measures 0.5cm length x 0.4cm width x 0.2cm depth; 0.157cm^2 area and 0.031cm^3 volume. There is Fat Layer (Subcutaneous Tissue) exposed. There is no tunneling or undermining noted. There is a medium amount of serosanguineous drainage noted. The wound margin is distinct with the outline attached to the wound base. There is small (1-33%) red granulation within the wound bed. There is a large (67-100%) amount of necrotic tissue within the wound bed including Adherent Slough. The periwound skin appearance had no abnormalities noted for moisture. The periwound skin appearance had no  abnormalities noted for color. The periwound skin appearance exhibited: Callus. Periwound temperature was noted as No Abnormality. The periwound has tenderness on palpation. Assessment Active Problems ICD-10 Type 2 diabetes mellitus with foot ulcer Non-pressure chronic ulcer of other part of left foot with fat layer exposed Type 2 diabetes  mellitus with diabetic peripheral angiopathy without gangrene Type 2 diabetes mellitus with diabetic polyneuropathy Patient's wound has shown improvement in size and appearance since last clinic visit. I debrided nonviable tissue. I recommended continuing with endoform. Continue to offload the area by floating heels and using Prevalon boot. We are awaiting further approval of hyperbaric oxygen therapy as he has already completed 40 treatments. He has benefited from the first 40 treatments. Procedures Wound #1 Pre-procedure diagnosis of Wound #1 is a Diabetic Wound/Ulcer of the Lower Extremity located on the Left,Posterior Calcaneus .Severity of Tissue Pre Debridement is: Fat layer exposed. There was a Selective/Open Wound Non-Viable Tissue Debridement with a total area of 1.57 sq cm performed by Geralyn Corwin, DO. With the following instrument(s): Curette to remove Non-Viable tissue/material. Material removed includes [facility] after achieving pain control using Lidocaine 5% topical ointment. No specimens were taken. A time out was conducted at 11:35, prior to the start of the procedure. A Minimum amount of bleeding was controlled with Pressure. The procedure was tolerated well. Post Debridement Measurements: 5cm length x 0.4cm width x 0.2cm depth; 0.314cm^3 volume. Character of Wound/Ulcer Post Debridement is improved. Severity of Tissue Post Debridement is: Fat layer exposed. Post procedure Diagnosis Wound #1: Same as Pre-Procedure Plan Follow-up Appointments: Return Appointment in 1 week. - Dr. Mikey Bussing - schedule for patient [name], [name] ([id number])  [id number] Page 8 of 9 Return Appointment in 2 weeks. - Dr. Mikey Bussing schedule for patient Return appointment in 3 weeks. - Dr. Mikey Bussing schedule for patient Other: - Once wounds close will discontinue hyperbarics. will request from [place] more hyperbaric oxygen therapy. Anesthetic: (In clinic) Topical Lidocaine 4% applied to wound bed - In clinic Bathing/ Shower/ Hygiene: May shower and wash wound with soap and water. - with dressing changes Off-Loading: Wound #1 Left,Posterior Calcaneus: Wedge shoe to: - Off loading shoes bilaterally to alleviate pressure from heels. Turn and reposition every 2 hours Prevalon Boot - bilaterally at bedtime Non Wound Condition: Apply the following to affected area as directed: - bordered foam as needed for protection on right foot. Hyperbaric Oxygen Therapy: Wound #1 Left,Posterior Calcaneus: Evaluate for HBO Therapy - Per patient, he has claustrophobia. If appropriate for treatment, begin HBOT per protocol: 2.0 ATA for 90 Minutes without Air Breaks T Number of Treatments: - 40 05/29/2023: additional 20 more treatments otal One treatments per day (delivered Monday through Friday unless otherwise specified in Special Instructions below): Finger stick Blood Glucose Pre- and Post- HBOT Treatment. Follow Hyperbaric Oxygen Glycemia Protocol Give two 4oz orange juices in addition to Glucerna when the glycemic protocol is used. Afrin (Oxymetazoline HCL) 0.05% nasal spray - 1 spray in both nostrils daily as needed prior to HBO treatment for difficulty clearing ears WOUND #1: - Calcaneus Wound Laterality: Left, Posterior Cleanser: Wound Cleanser Every Other Day/30 Days Discharge Instructions: Cleanse the wound with wound cleanser prior to applying a clean dressing using gauze sponges, not tissue or cotton balls. Peri-Wound Care: Skin Prep Every Other Day/30 Days Discharge Instructions: Use skin prep as directed Prim Dressing: Endoform 2x2  in Every Other Day/30 Days ary Discharge Instructions: Moisten with saline Secondary Dressing: ALLEVYN Heel 4 1/2in x 5 1/2in / 10.5cm x 13.5cm Every Other Day/30 Days Discharge Instructions: Apply over primary dressing as directed. Secondary Dressing: Drawtex 4x4 in Every Other Day/30 Days Discharge Instructions: Apply over primary dressing cut to fit inside wound Secondary Dressing: Woven Gauze Sponge, Non-Sterile 4x4 in Every Other Day/30 Days Discharge Instructions: Apply over primary dressing  as directed. Secured With: [employer], 4.5x3.1 (in/yd) Every Other Day/30 Days Discharge Instructions: Secure with Kerlix as directed. Secured With: 55M Medipore H Soft Cloth Surgical T ape, 4 x 10 (in/yd) Every Other Day/30 Days Discharge Instructions: Secure with tape as directed. 1. In office sharp debridement 2. Endoform 3. Aggressive offloadingPrevalon boots/floating heels 4. Follow-up in 1 week 5. Continue hyperbaric oxygen therapy once patient obtains approval to continue from insurance Electronic Signature(s) Signed: 06/06/2023 4:08:41 PM By: Geralyn Corwin DO Entered By: Geralyn Corwin on 06/06/2023 12:42:54 -------------------------------------------------------------------------------- SuperBill Details Patient Name: Date of Service: [name], [name] 06/06/2023 Medical Record Number: [id number] Patient Account Number: [id number] Date of Birth/Sex: Treating RN: 12/16/1945 (78 y.o. M) Primary Care Provider: [name] Other Clinician: Referring Provider: Treating Provider/Extender: Geralyn Corwin [facility], [place] in Treatment: 14 Diagnosis Coding ICD-10 Codes Code Description 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 angiopathy without gangrene E11.42 Type 2 diabetes mellitus with diabetic  polyneuropathy [name], [name] ([id number]) [id number] Page 9 of 9 Facility Procedures : CPT4 Code: [id number] Description: [phone] - DEBRIDE WOUND 1ST 20 SQ CM OR < ICD-10 Diagnosis Description 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.42 Type 2 diabetes mellitus with  diabetic polyneuropathy Modifier: Quantity: 1 Physician Procedures : CPT4 Code Description Modifier [id number] - WC PHYS DEBR WO ANESTH 20 SQ CM ICD-10 Diagnosis Description 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.42  Type 2 diabetes mellitus with diabetic polyneuropathy Quantity: 1 Electronic Signature(s) Signed: 06/06/2023 4:08:41 PM By: Geralyn Corwin DO Entered By: Geralyn Corwin on 06/06/2023 12:43:06

## 2023-06-11 NOTE — Progress Notes (Signed)
[name] ([id number]) [id number] Page 1 of 8 Visit Report for 06/06/2023 Arrival Information Details Patient Name: Date of Service: [name], [name] Medical Record Number: [id number] Patient Account Number: [id number] Date of Birth/Sex: Treating RN: 1946-04-16 (78 y.o. [name] Primary Care Jacquise Rarick: [name] Other Clinician: Referring Deiondre Harrower: Treating Blue Winther/Extender: Geralyn Corwin [facility], [place] in Treatment: 14 Visit Information History Since Last Visit Added or deleted any medications: No Patient Arrived: Walker Any new allergies or adverse reactions: No Arrival Time: 11:04 Had Billy fall or experienced change in No Accompanied By: wife activities of daily living that may affect Transfer Assistance: None risk of falls: Patient Identification Verified: Yes Signs or symptoms of abuse/neglect since last visito No Secondary Verification Process Completed: Yes Hospitalized since last visit: No Patient Requires Transmission-Based Precautions: No Implantable device outside of the clinic excluding No Patient Has Alerts: No cellular tissue based products placed in the center since last visit: Has Dressing in Place as Prescribed: Yes Pain Present Now: No Electronic Signature(s) Signed: 06/11/2023 4:04:36 PM By: Thayer Dallas Entered By: Thayer Dallas on 06/06/2023 11:05:17 -------------------------------------------------------------------------------- Encounter Discharge Information Details Patient Name: Date of Service: [name], [name] Medical Record Number: [id number] Patient Account Number: [id number] Date of Birth/Sex: Treating RN: February 23, 1946 (78 y.o. Cline Cools Primary Care Tillie Viverette: [name] Other Clinician: Referring Senai Kingsley: Treating Tanaiya Kolarik/Extender: [facility] [place] in Treatment: 14 Encounter Discharge Information  Items Post Procedure Vitals Discharge Condition: Stable Temperature (F): 97.8 Ambulatory Status: Walker Pulse (bpm): 58 Discharge Destination: Home Respiratory Rate (breaths/min): 18 Transportation: Private Auto Blood Pressure (mmHg): 151/64 Accompanied By: wife Schedule Follow-up Appointment: Yes Clinical Summary of Care: Patient Declined Electronic Signature(s) Signed: 06/06/2023 4:39:29 PM By: Redmond Pulling RN, BSN Entered By: Redmond Pulling on 06/06/2023 12:16:07 [name] ([id number]) [id number] Page 2 of 8 -------------------------------------------------------------------------------- Lower Extremity Assessment Details Patient Name: Date of Service: [name], [name] Medical Record Number: [id number] Patient Account Number: [id number] Date of Birth/Sex: Treating RN: Dec 08, 1945 (78 y.o. [name] Primary Care Annjeanette Sarwar: [name] Other Clinician: Referring Cathleen Yagi: Treating Tonyia Marschall/Extender: [facility] [place] in Treatment: 14 Edema Assessment Assessed: [Left: No] [Right: No] Edema: [Left: Yes] [Right: No] Calf Left: Right: Point of Measurement: From Medial Instep 35.5 cm Ankle Left: Right: Point of Measurement: From Medial Instep 22.5 cm Vascular Assessment Extremity colors, hair growth, and conditions: Extremity Color: [Left:Normal] [Right:Normal] Hair Growth on Extremity: [Left:No] Temperature of Extremity: [Left:Warm] [Right:Warm] Capillary Refill: [Left:< 3 seconds] [Right:< 3 seconds] Dependent Rubor: [Left:No No] Electronic Signature(s) Signed: 06/11/2023 4:04:36 PM By: Thayer Dallas Entered By: Thayer Dallas on 06/06/2023 11:13:59 -------------------------------------------------------------------------------- Multi Wound Chart Details Patient Name: Date of Service: [name], [name] Medical Record Number: [id number] Patient Account Number:  [id number] Date of Birth/Sex: Treating RN: 30-Sep-1945 (78 y.o. [name] Primary Care Marialuisa Basara: [name] Other Clinician: Referring Mico Spark: Treating Katheline Brendlinger/Extender: [facility] [place] in Treatment: 14 Vital Signs Height(in): 70 Capillary Blood Glucose(mg/dl): 932 Weight(lbs): 355 Pulse(bpm): 58 Body Mass Index(BMI): 28.1 Blood Pressure(mmHg): 151/64 Temperature(F): 97.8 Respiratory Rate(breaths/min): 18 [1:Photos:] [[name]:[name]] Left, Posterior Calcaneus [name] [name] Wound Location: Blister [name] [name] Wounding Event: Diabetic Wound/Ulcer of the Lower [name] [name] Primary Etiology: Extremity Arrhythmia, Coronary Artery Disease, [name] [name] Comorbid History: Hypertension, Peripheral Venous Disease, Type II Diabetes, Osteoarthritis, Neuropathy 01/20/2023 [name] [name] Date Acquired: 14 [name] [name] Weeks of Treatment: Open  [name] [name] Wound Status: No [name] [name] Wound Recurrence: 0.5x0.4x0.2 [name] [name] Measurements L x W x D (cm) 0.157 [name] [name] Billy (cm) : rea 0.031 [name] [name] Volume (cm) : 90.70% [name] [name] % Reduction in Billy rea: 90.90% [name] [name] % Reduction in Volume: Grade 3 [name] [name] Classification: Medium [name] [name] Exudate Billy mount: Serosanguineous [name] [name] Exudate Type: red, brown [name] [name] Exudate Color: Distinct, outline attached [name] [name] Wound Margin: Small (1-33%) [name] [name] Granulation Billy mount: Red [name] [name] Granulation Quality: Large (67-100%) [name] [name] Necrotic Billy mount: Fat Layer (Subcutaneous Tissue): Yes [name] [name] Exposed Structures: Fascia: No Tendon: No Muscle: No Joint: No Bone: No Small (1-33%) [name] [name] Epithelialization: Debridement - Selective/Open Wound [name] [name] Debridement: Pre-procedure Verification/Time Out 11:35 [name] [name] Taken: Lidocaine 5% topical ointment [name] [name] Pain Control: Slough [name] [name] Tissue Debrided: Non-Viable Tissue [name] [name] Level: 1.57 [name] [name] Debridement Billy (sq cm): rea Curette [name] [name] Instrument: Minimum [name] [name] Bleeding: Pressure [name]  [name] Hemostasis Billy chieved: Procedure was tolerated well [name] [name] Debridement Treatment Response: 5x0.4x0.2 [name] [name] Post Debridement Measurements L x W x D (cm) 0.314 [name] [name] Post Debridement Volume: (cm) Callus: Yes [name] [name] Periwound Skin Texture: Maceration: No [name] [name] Periwound Skin Moisture: Dry/Scaly: No No Abnormalities Noted [name] [name] Periwound Skin Color: No Abnormality [name] [name] Temperature: Yes [name] [name] Tenderness on Palpation: Debridement [name] [name] Procedures Performed: Treatment Notes Wound #1 (Calcaneus) Wound Laterality: Left, Posterior Cleanser Wound Cleanser Discharge Instruction: Cleanse the wound with wound cleanser prior to applying Billy clean dressing using gauze sponges, not tissue or cotton balls. Peri-Wound Care Skin Prep Discharge Instruction: Use skin prep as directed Topical Primary Dressing Endoform 2x2 in Discharge Instruction: Moisten with saline Secondary Dressing ALLEVYN Heel 4 1/2in x 5 1/2in / 10.5cm x 13.5cm Discharge Instruction: Apply over primary dressing as directed. Drawtex 4x4 in Discharge Instruction: Apply over primary dressing cut to fit inside wound [name], [name] ([id number]) [id number] Page 4 of 8 Woven Gauze Sponge, Non-Sterile 4x4 in Discharge Instruction: Apply over primary dressing as directed. Secured With [employer], 4.5x3.1 (in/yd) Discharge Instruction: Secure with Kerlix as directed. 59M Medipore H Soft Cloth Surgical T ape, 4 x 10 (in/yd) Discharge Instruction: Secure with tape as directed. Compression Wrap Compression Stockings Add-Ons Electronic Signature(s) Signed: 06/06/2023 4:08:41 PM By: Geralyn Corwin DO Entered By: Geralyn Corwin on 06/06/2023 12:18:48 -------------------------------------------------------------------------------- Multi-Disciplinary Care Plan Details Patient Name: Date of Service: [name], [name] Medical Record Number:  [id number] Patient Account Number: [id number] Date of Birth/Sex: Treating RN: 1945/11/11 (78 y.o. Cline Cools Primary Care Zariah Cavendish: [name] Other Clinician: Referring Ranette Luckadoo: Treating Brigg Cape/Extender: [facility] [place] in Treatment: 14 Multidisciplinary Care Plan reviewed with physician Active Inactive HBO Nursing Diagnoses: Potential for barotraumas to ears, sinuses, teeth, and lungs or cerebral gas embolism related to changes in atmospheric pressure inside hyperbaric oxygen chamber Potential for oxygen toxicity seizures related to delivery of 100% oxygen at an increased atmospheric pressure Potential for pulmonary oxygen toxicity related to delivery of 100% oxygen at an increased atmospheric pressure Goals: Barotrauma will be prevented during HBO2 Date Initiated: 05/21/2023 T arget Resolution Date: 06/18/2023 Goal Status: Active Patient and/or family will be able to state/discuss factors appropriate to the management of their disease process during treatment Date Initiated: 05/21/2023 T arget Resolution Date: 06/18/2023 Goal Status: Active Patient will tolerate the hyperbaric oxygen therapy treatment Date Initiated: 05/21/2023 T arget Resolution Date: 06/18/2023  Goal Status: Active Patient will tolerate the internal climate of the chamber Date Initiated: 05/21/2023 T arget Resolution Date: 06/18/2023 Goal Status: Active Patient/caregiver will verbalize understanding of HBO goals, rationale, procedures and potential hazards Date Initiated: 05/21/2023 T arget Resolution Date: 06/18/2023 Goal Status: Active Signs and symptoms of pulmonary oxygen toxicity will be recognized and promptly addressed Date Initiated: 05/21/2023 T arget Resolution Date: 06/18/2023 Goal Status: Active Signs and symptoms of seizure will be recognized and promptly addressed ; seizing patients will suffer no harm Date Initiated: 05/21/2023 T arget Resolution  Date: 06/18/2023 Goal Status: Active Interventions: [name], [name] ([id number]) [id number] Page 5 of 8 Administer decongestants, per physician orders, prior to HBO2 Administer the correct therapeutic gas delivery based on the patients needs and limitations, per physician order Assess and provide for patients comfort related to the hyperbaric environment and equalization of middle ear Assess for signs and symptoms related to adverse events, including but not limited to confinement anxiety, pneumothorax, oxygen toxicity and baurotrauma Assess patient for any history of confinement anxiety Assess patient's knowledge and expectations regarding hyperbaric medicine and provide education related to the hyperbaric environment, goals of treatment and prevention of adverse events Implement protocols to decrease risk of pneumothorax in high risk patients Notes: Wound/Skin Impairment Nursing Diagnoses: Knowledge deficit related to smoking impact on wound healing Goals: Patient/caregiver will verbalize understanding of skin care regimen Date Initiated: 02/26/2023 Target Resolution Date: 06/21/2023 Goal Status: Active Interventions: Assess patient/caregiver ability to obtain necessary supplies Assess patient/caregiver ability to perform ulcer/skin care regimen upon admission and as needed Assess ulceration(s) every visit Provide education on ulcer and skin care Screen for HBO Treatment Activities: Consult for HBO : 02/26/2023 Skin care regimen initiated : 02/26/2023 Topical wound management initiated : 02/26/2023 Notes: Electronic Signature(s) Signed: 06/06/2023 4:39:29 PM By: Redmond Pulling RN, BSN Entered By: Redmond Pulling on 06/06/2023 12:13:56 -------------------------------------------------------------------------------- Pain Assessment Details Patient Name: Date of Service: [name], [name] Medical Record Number: [id number] Patient Account  Number: [id number] Date of Birth/Sex: Treating RN: January 09, 1946 (78 y.o. [name] Primary Care Kristalyn Bergstresser: [name] Other Clinician: Referring Kyira Volkert: Treating Charleene Callegari/Extender: [facility] [place] in Treatment: 14 Active Problems Location of Pain Severity and Description of Pain Patient Has Paino No Site Locations [name], [name] ([id number]) [id number] Page 6 of 8 Pain Management and Medication Current Pain Management: Electronic Signature(s) Signed: 06/11/2023 4:04:36 PM By: Thayer Dallas Entered By: Thayer Dallas on 06/06/2023 11:05:30 -------------------------------------------------------------------------------- Patient/Caregiver Education Details Patient Name: Date of Service: [name], [name] 1/16/2025andnbsp11:00 [name] Medical Record Number: [id number] Patient Account Number: [id number] Date of Birth/Gender: Treating RN: 08-Dec-1945 (78 y.o. Cline Cools Primary Care Physician: [name] Other Clinician: Referring Physician: Treating Physician/Extender: [facility] [place] in Treatment: 14 Education Assessment Education Provided To: Patient Education Topics Provided Wound/Skin Impairment: Methods: Explain/Verbal Responses: State content correctly [employer]) Signed: 06/06/2023 4:39:29 PM By: Redmond Pulling RN, BSN Entered By: Redmond Pulling on 06/06/2023 12:14:12 -------------------------------------------------------------------------------- Wound Assessment Details Patient Name: Date of Service: [name], [name] Medical Record Number: [id number] Patient Account Number: [id number] Date of Birth/Sex: Treating RN: 08-21-1945 (78 y.o. [name] Primary Care Myesha Stillion: [name] Other Clinician: Referring Declynn Lopresti: Treating [place]/Extender: [facility], [place] ([id number])  [id number] Page 7 of 8 Weeks in Treatment: 14 Wound Status Wound Number: 1 Primary Diabetic Wound/Ulcer of the Lower Extremity Etiology: Wound Location: Left, Posterior Calcaneus Wound Open  Wounding Event: Blister Status: Date Acquired: 01/20/2023 Comorbid Arrhythmia, Coronary Artery Disease, Hypertension, Peripheral Weeks Of Treatment: 14 History: Venous Disease, Type II Diabetes, Osteoarthritis, Neuropathy Clustered Wound: No Photos Wound Measurements Length: (cm) 0.5 Width: (cm) 0.4 Depth: (cm) 0.2 Area: (cm) 0.157 Volume: (cm) 0.031 % Reduction in Area: 90.7% % Reduction in Volume: 90.9% Epithelialization: Small (1-33%) Tunneling: No Undermining: No Wound Description Classification: Grade 3 Wound Margin: Distinct, outline attached Exudate Amount: Medium Exudate Type: Serosanguineous Exudate Color: red, brown Foul Odor After Cleansing: No Slough/Fibrino Yes Wound Bed Granulation Amount: Small (1-33%) Exposed Structure Granulation Quality: Red Fascia Exposed: No Necrotic Amount: Large (67-100%) Fat Layer (Subcutaneous Tissue) Exposed: Yes Necrotic Quality: Adherent Slough Tendon Exposed: No Muscle Exposed: No Joint Exposed: No Bone Exposed: No Periwound Skin Texture Texture Color No Abnormalities Noted: No No Abnormalities Noted: Yes Callus: Yes Temperature / Pain Temperature: No Abnormality Moisture No Abnormalities Noted: Yes Tenderness on Palpation: Yes Treatment Notes Wound #1 (Calcaneus) Wound Laterality: Left, Posterior Cleanser Wound Cleanser Discharge Instruction: Cleanse the wound with wound cleanser prior to applying Billy clean dressing using gauze sponges, not tissue or cotton balls. Peri-Wound Care Skin Prep Discharge Instruction: Use skin prep as directed Topical Primary Dressing Endoform 2x2 in Discharge Instruction: Moisten with saline Secondary Dressing [name], [name] ([id number])  [phone].pdf Page 8 of 8 ALLEVYN Heel 4 1/2in x 5 1/2in / 10.5cm x 13.5cm Discharge Instruction: Apply over primary dressing as directed. Drawtex 4x4 in Discharge Instruction: Apply over primary dressing cut to fit inside wound Woven Gauze Sponge, Non-Sterile 4x4 in Discharge Instruction: Apply over primary dressing as directed. Secured With [employer], 4.5x3.1 (in/yd) Discharge Instruction: Secure with Kerlix as directed. 34M Medipore H Soft Cloth Surgical T ape, 4 x 10 (in/yd) Discharge Instruction: Secure with tape as directed. Compression Wrap Compression Stockings Add-Ons Electronic Signature(s) Signed: 06/11/2023 4:04:36 PM By: Thayer Dallas Entered By: Thayer Dallas on 06/06/2023 11:17:00 -------------------------------------------------------------------------------- Vitals Details Patient Name: Date of Service: [name], [name] Medical Record Number: [id number] Patient Account Number: [id number] Date of Birth/Sex: Treating RN: 12-Jan-1946 (78 y.o. [name] Primary Care Breniyah Romm: [name] Other Clinician: Referring Reeva Davern: Treating Ayisha Pol/Extender: [facility] [place] in Treatment: 14 Vital Signs Time Taken: 11:14 Temperature (F): 97.8 Height (in): 70 Pulse (bpm): 58 Weight (lbs): 196 Respiratory Rate (breaths/min): 18 Body Mass Index (BMI): 28.1 Blood Pressure (mmHg): 151/64 Capillary Blood Glucose (mg/dl): 063 Reference Range: 80 - 120 mg / dl Electronic Signature(s) Signed: 06/11/2023 4:04:36 PM By: Thayer Dallas Entered By: Thayer Dallas on 06/06/2023 11:15:53

## 2023-06-13 ENCOUNTER — Encounter (HOSPITAL_BASED_OUTPATIENT_CLINIC_OR_DEPARTMENT_OTHER): Payer: No Typology Code available for payment source | Admitting: Internal Medicine

## 2023-06-13 DIAGNOSIS — E11621 Type 2 diabetes mellitus with foot ulcer: Secondary | ICD-10-CM | POA: Diagnosis not present

## 2023-06-13 DIAGNOSIS — E1142 Type 2 diabetes mellitus with diabetic polyneuropathy: Secondary | ICD-10-CM

## 2023-06-13 DIAGNOSIS — L97522 Non-pressure chronic ulcer of other part of left foot with fat layer exposed: Secondary | ICD-10-CM

## 2023-06-13 DIAGNOSIS — E1151 Type 2 diabetes mellitus with diabetic peripheral angiopathy without gangrene: Secondary | ICD-10-CM

## 2023-06-13 LAB — GLUCOSE, CAPILLARY
Glucose-Capillary: 174 mg/dL — ABNORMAL HIGH (ref 70–99)
Glucose-Capillary: 143 mg/dL — ABNORMAL HIGH (ref 70–99)

## 2023-06-14 ENCOUNTER — Encounter (HOSPITAL_BASED_OUTPATIENT_CLINIC_OR_DEPARTMENT_OTHER): Payer: No Typology Code available for payment source | Admitting: Internal Medicine

## 2023-06-14 DIAGNOSIS — L97522 Non-pressure chronic ulcer of other part of left foot with fat layer exposed: Secondary | ICD-10-CM

## 2023-06-14 DIAGNOSIS — E11621 Type 2 diabetes mellitus with foot ulcer: Secondary | ICD-10-CM

## 2023-06-14 DIAGNOSIS — E1151 Type 2 diabetes mellitus with diabetic peripheral angiopathy without gangrene: Secondary | ICD-10-CM

## 2023-06-14 DIAGNOSIS — E1142 Type 2 diabetes mellitus with diabetic polyneuropathy: Secondary | ICD-10-CM | POA: Diagnosis not present

## 2023-06-14 LAB — GLUCOSE, CAPILLARY
Glucose-Capillary: 245 mg/dL — ABNORMAL HIGH (ref 70–99)
Glucose-Capillary: 192 mg/dL — ABNORMAL HIGH (ref 70–99)

## 2023-06-17 ENCOUNTER — Encounter (HOSPITAL_BASED_OUTPATIENT_CLINIC_OR_DEPARTMENT_OTHER): Payer: No Typology Code available for payment source | Admitting: Internal Medicine

## 2023-06-17 DIAGNOSIS — L97522 Non-pressure chronic ulcer of other part of left foot with fat layer exposed: Secondary | ICD-10-CM | POA: Diagnosis not present

## 2023-06-17 DIAGNOSIS — E1142 Type 2 diabetes mellitus with diabetic polyneuropathy: Secondary | ICD-10-CM | POA: Diagnosis not present

## 2023-06-17 DIAGNOSIS — E11621 Type 2 diabetes mellitus with foot ulcer: Secondary | ICD-10-CM

## 2023-06-17 DIAGNOSIS — E1151 Type 2 diabetes mellitus with diabetic peripheral angiopathy without gangrene: Secondary | ICD-10-CM | POA: Diagnosis not present

## 2023-06-17 LAB — GLUCOSE, CAPILLARY
Glucose-Capillary: 300 mg/dL — ABNORMAL HIGH (ref 70–99)
Glucose-Capillary: 228 mg/dL — ABNORMAL HIGH (ref 70–99)

## 2023-06-18 ENCOUNTER — Encounter (HOSPITAL_BASED_OUTPATIENT_CLINIC_OR_DEPARTMENT_OTHER): Payer: No Typology Code available for payment source | Admitting: Internal Medicine

## 2023-06-18 DIAGNOSIS — E11621 Type 2 diabetes mellitus with foot ulcer: Secondary | ICD-10-CM

## 2023-06-18 DIAGNOSIS — E1151 Type 2 diabetes mellitus with diabetic peripheral angiopathy without gangrene: Secondary | ICD-10-CM

## 2023-06-18 DIAGNOSIS — L97522 Non-pressure chronic ulcer of other part of left foot with fat layer exposed: Secondary | ICD-10-CM | POA: Diagnosis not present

## 2023-06-18 DIAGNOSIS — E1142 Type 2 diabetes mellitus with diabetic polyneuropathy: Secondary | ICD-10-CM

## 2023-06-18 LAB — GLUCOSE, CAPILLARY
Glucose-Capillary: 174 mg/dL — ABNORMAL HIGH (ref 70–99)
Glucose-Capillary: 162 mg/dL — ABNORMAL HIGH (ref 70–99)

## 2023-06-19 ENCOUNTER — Encounter (HOSPITAL_BASED_OUTPATIENT_CLINIC_OR_DEPARTMENT_OTHER): Payer: No Typology Code available for payment source | Admitting: Internal Medicine

## 2023-06-19 DIAGNOSIS — L97522 Non-pressure chronic ulcer of other part of left foot with fat layer exposed: Secondary | ICD-10-CM

## 2023-06-19 DIAGNOSIS — E1142 Type 2 diabetes mellitus with diabetic polyneuropathy: Secondary | ICD-10-CM

## 2023-06-19 DIAGNOSIS — E11621 Type 2 diabetes mellitus with foot ulcer: Secondary | ICD-10-CM

## 2023-06-19 DIAGNOSIS — E1151 Type 2 diabetes mellitus with diabetic peripheral angiopathy without gangrene: Secondary | ICD-10-CM | POA: Diagnosis not present

## 2023-06-19 LAB — GLUCOSE, CAPILLARY
Glucose-Capillary: 202 mg/dL — ABNORMAL HIGH (ref 70–99)
Glucose-Capillary: 150 mg/dL — ABNORMAL HIGH (ref 70–99)

## 2023-06-20 ENCOUNTER — Encounter (HOSPITAL_BASED_OUTPATIENT_CLINIC_OR_DEPARTMENT_OTHER): Payer: No Typology Code available for payment source | Admitting: General Surgery

## 2023-06-20 DIAGNOSIS — E1142 Type 2 diabetes mellitus with diabetic polyneuropathy: Secondary | ICD-10-CM

## 2023-06-20 DIAGNOSIS — E11621 Type 2 diabetes mellitus with foot ulcer: Secondary | ICD-10-CM

## 2023-06-20 DIAGNOSIS — L97522 Non-pressure chronic ulcer of other part of left foot with fat layer exposed: Secondary | ICD-10-CM

## 2023-06-20 DIAGNOSIS — E1151 Type 2 diabetes mellitus with diabetic peripheral angiopathy without gangrene: Secondary | ICD-10-CM | POA: Diagnosis not present

## 2023-06-20 LAB — GLUCOSE, CAPILLARY
Glucose-Capillary: 190 mg/dL — ABNORMAL HIGH (ref 70–99)
Glucose-Capillary: 131 mg/dL — ABNORMAL HIGH (ref 70–99)

## 2023-06-21 ENCOUNTER — Encounter (HOSPITAL_BASED_OUTPATIENT_CLINIC_OR_DEPARTMENT_OTHER): Payer: No Typology Code available for payment source | Admitting: General Surgery

## 2023-06-21 ENCOUNTER — Ambulatory Visit (HOSPITAL_BASED_OUTPATIENT_CLINIC_OR_DEPARTMENT_OTHER): Payer: No Typology Code available for payment source | Admitting: Internal Medicine

## 2023-06-21 DIAGNOSIS — E11621 Type 2 diabetes mellitus with foot ulcer: Secondary | ICD-10-CM | POA: Diagnosis not present

## 2023-06-21 LAB — GLUCOSE, CAPILLARY
Glucose-Capillary: 217 mg/dL — ABNORMAL HIGH (ref 70–99)
Glucose-Capillary: 161 mg/dL — ABNORMAL HIGH (ref 70–99)

## 2023-06-24 ENCOUNTER — Encounter (HOSPITAL_BASED_OUTPATIENT_CLINIC_OR_DEPARTMENT_OTHER): Payer: No Typology Code available for payment source | Attending: Internal Medicine | Admitting: Internal Medicine

## 2023-06-24 DIAGNOSIS — I129 Hypertensive chronic kidney disease with stage 1 through stage 4 chronic kidney disease, or unspecified chronic kidney disease: Secondary | ICD-10-CM | POA: Diagnosis not present

## 2023-06-24 DIAGNOSIS — E1151 Type 2 diabetes mellitus with diabetic peripheral angiopathy without gangrene: Secondary | ICD-10-CM | POA: Diagnosis not present

## 2023-06-24 DIAGNOSIS — E785 Hyperlipidemia, unspecified: Secondary | ICD-10-CM | POA: Insufficient documentation

## 2023-06-24 DIAGNOSIS — E1142 Type 2 diabetes mellitus with diabetic polyneuropathy: Secondary | ICD-10-CM | POA: Insufficient documentation

## 2023-06-24 DIAGNOSIS — N1832 Chronic kidney disease, stage 3b: Secondary | ICD-10-CM | POA: Insufficient documentation

## 2023-06-24 DIAGNOSIS — L97522 Non-pressure chronic ulcer of other part of left foot with fat layer exposed: Secondary | ICD-10-CM | POA: Insufficient documentation

## 2023-06-24 DIAGNOSIS — E11621 Type 2 diabetes mellitus with foot ulcer: Secondary | ICD-10-CM | POA: Insufficient documentation

## 2023-06-24 LAB — GLUCOSE, CAPILLARY: Glucose-Capillary: 195 mg/dL — ABNORMAL HIGH (ref 70–99)

## 2023-06-25 ENCOUNTER — Encounter (HOSPITAL_BASED_OUTPATIENT_CLINIC_OR_DEPARTMENT_OTHER): Payer: No Typology Code available for payment source | Admitting: Internal Medicine

## 2023-06-25 DIAGNOSIS — L97522 Non-pressure chronic ulcer of other part of left foot with fat layer exposed: Secondary | ICD-10-CM | POA: Diagnosis not present

## 2023-06-25 DIAGNOSIS — E1151 Type 2 diabetes mellitus with diabetic peripheral angiopathy without gangrene: Secondary | ICD-10-CM | POA: Diagnosis not present

## 2023-06-25 DIAGNOSIS — E11621 Type 2 diabetes mellitus with foot ulcer: Secondary | ICD-10-CM | POA: Diagnosis not present

## 2023-06-25 LAB — GLUCOSE, CAPILLARY
Glucose-Capillary: 147 mg/dL — ABNORMAL HIGH (ref 70–99)
Glucose-Capillary: 192 mg/dL — ABNORMAL HIGH (ref 70–99)
Glucose-Capillary: 145 mg/dL — ABNORMAL HIGH (ref 70–99)

## 2023-06-26 ENCOUNTER — Encounter (HOSPITAL_BASED_OUTPATIENT_CLINIC_OR_DEPARTMENT_OTHER): Payer: No Typology Code available for payment source | Admitting: Internal Medicine

## 2023-06-26 DIAGNOSIS — E11621 Type 2 diabetes mellitus with foot ulcer: Secondary | ICD-10-CM

## 2023-06-26 DIAGNOSIS — E1142 Type 2 diabetes mellitus with diabetic polyneuropathy: Secondary | ICD-10-CM | POA: Diagnosis not present

## 2023-06-26 DIAGNOSIS — L97522 Non-pressure chronic ulcer of other part of left foot with fat layer exposed: Secondary | ICD-10-CM | POA: Diagnosis not present

## 2023-06-26 DIAGNOSIS — E1151 Type 2 diabetes mellitus with diabetic peripheral angiopathy without gangrene: Secondary | ICD-10-CM

## 2023-06-26 LAB — GLUCOSE, CAPILLARY
Glucose-Capillary: 183 mg/dL — ABNORMAL HIGH (ref 70–99)
Glucose-Capillary: 141 mg/dL — ABNORMAL HIGH (ref 70–99)

## 2023-06-27 ENCOUNTER — Encounter (HOSPITAL_BASED_OUTPATIENT_CLINIC_OR_DEPARTMENT_OTHER): Payer: No Typology Code available for payment source | Admitting: Internal Medicine

## 2023-06-27 DIAGNOSIS — L97522 Non-pressure chronic ulcer of other part of left foot with fat layer exposed: Secondary | ICD-10-CM | POA: Diagnosis not present

## 2023-06-27 DIAGNOSIS — E1151 Type 2 diabetes mellitus with diabetic peripheral angiopathy without gangrene: Secondary | ICD-10-CM

## 2023-06-27 DIAGNOSIS — E11621 Type 2 diabetes mellitus with foot ulcer: Secondary | ICD-10-CM | POA: Diagnosis not present

## 2023-06-27 DIAGNOSIS — E1142 Type 2 diabetes mellitus with diabetic polyneuropathy: Secondary | ICD-10-CM | POA: Diagnosis not present

## 2023-06-27 LAB — GLUCOSE, CAPILLARY
Glucose-Capillary: 173 mg/dL — ABNORMAL HIGH (ref 70–99)
Glucose-Capillary: 143 mg/dL — ABNORMAL HIGH (ref 70–99)

## 2023-06-28 ENCOUNTER — Encounter (HOSPITAL_BASED_OUTPATIENT_CLINIC_OR_DEPARTMENT_OTHER): Payer: No Typology Code available for payment source | Admitting: Internal Medicine

## 2023-06-28 DIAGNOSIS — E1142 Type 2 diabetes mellitus with diabetic polyneuropathy: Secondary | ICD-10-CM | POA: Diagnosis not present

## 2023-06-28 DIAGNOSIS — L97522 Non-pressure chronic ulcer of other part of left foot with fat layer exposed: Secondary | ICD-10-CM

## 2023-06-28 DIAGNOSIS — E11621 Type 2 diabetes mellitus with foot ulcer: Secondary | ICD-10-CM

## 2023-06-28 DIAGNOSIS — E1151 Type 2 diabetes mellitus with diabetic peripheral angiopathy without gangrene: Secondary | ICD-10-CM | POA: Diagnosis not present

## 2023-06-28 LAB — GLUCOSE, CAPILLARY
Glucose-Capillary: 165 mg/dL — ABNORMAL HIGH (ref 70–99)
Glucose-Capillary: 171 mg/dL — ABNORMAL HIGH (ref 70–99)

## 2023-07-01 ENCOUNTER — Encounter (HOSPITAL_BASED_OUTPATIENT_CLINIC_OR_DEPARTMENT_OTHER): Payer: No Typology Code available for payment source | Admitting: Internal Medicine

## 2023-07-02 ENCOUNTER — Encounter (HOSPITAL_BASED_OUTPATIENT_CLINIC_OR_DEPARTMENT_OTHER): Payer: No Typology Code available for payment source | Admitting: Internal Medicine

## 2023-07-03 ENCOUNTER — Encounter (HOSPITAL_BASED_OUTPATIENT_CLINIC_OR_DEPARTMENT_OTHER): Payer: No Typology Code available for payment source | Admitting: Internal Medicine

## 2023-07-04 ENCOUNTER — Encounter (HOSPITAL_BASED_OUTPATIENT_CLINIC_OR_DEPARTMENT_OTHER): Payer: No Typology Code available for payment source | Admitting: Internal Medicine

## 2023-07-05 ENCOUNTER — Encounter (HOSPITAL_BASED_OUTPATIENT_CLINIC_OR_DEPARTMENT_OTHER): Payer: No Typology Code available for payment source | Admitting: Internal Medicine

## 2023-07-08 ENCOUNTER — Encounter (HOSPITAL_BASED_OUTPATIENT_CLINIC_OR_DEPARTMENT_OTHER): Payer: No Typology Code available for payment source | Admitting: Internal Medicine

## 2023-07-09 ENCOUNTER — Encounter (HOSPITAL_BASED_OUTPATIENT_CLINIC_OR_DEPARTMENT_OTHER): Payer: No Typology Code available for payment source | Admitting: Internal Medicine

## 2023-07-10 ENCOUNTER — Encounter (HOSPITAL_BASED_OUTPATIENT_CLINIC_OR_DEPARTMENT_OTHER): Payer: No Typology Code available for payment source | Admitting: Internal Medicine

## 2023-07-11 ENCOUNTER — Encounter (HOSPITAL_BASED_OUTPATIENT_CLINIC_OR_DEPARTMENT_OTHER): Payer: No Typology Code available for payment source | Admitting: Internal Medicine

## 2023-07-12 ENCOUNTER — Encounter (HOSPITAL_BASED_OUTPATIENT_CLINIC_OR_DEPARTMENT_OTHER): Payer: No Typology Code available for payment source | Admitting: Internal Medicine

## 2023-07-29 ENCOUNTER — Ambulatory Visit (HOSPITAL_BASED_OUTPATIENT_CLINIC_OR_DEPARTMENT_OTHER): Payer: No Typology Code available for payment source | Admitting: Internal Medicine

## 2023-09-29 ENCOUNTER — Encounter (HOSPITAL_BASED_OUTPATIENT_CLINIC_OR_DEPARTMENT_OTHER): Payer: Self-pay | Admitting: Emergency Medicine

## 2023-09-29 ENCOUNTER — Emergency Department (HOSPITAL_BASED_OUTPATIENT_CLINIC_OR_DEPARTMENT_OTHER)

## 2023-09-29 ENCOUNTER — Other Ambulatory Visit: Payer: Self-pay

## 2023-09-29 ENCOUNTER — Emergency Department (HOSPITAL_BASED_OUTPATIENT_CLINIC_OR_DEPARTMENT_OTHER)
Admission: EM | Admit: 2023-09-29 | Discharge: 2023-09-29 | Disposition: A | Discharge status: Home / Self Care | Attending: Emergency Medicine | Admitting: Emergency Medicine

## 2023-09-29 DIAGNOSIS — Z7982 Long term (current) use of aspirin: Secondary | ICD-10-CM | POA: Diagnosis not present

## 2023-09-29 DIAGNOSIS — I120 Hypertensive chronic kidney disease with stage 5 chronic kidney disease or end stage renal disease: Secondary | ICD-10-CM | POA: Diagnosis not present

## 2023-09-29 DIAGNOSIS — N186 End stage renal disease: Secondary | ICD-10-CM | POA: Insufficient documentation

## 2023-09-29 DIAGNOSIS — Z79899 Other long term (current) drug therapy: Secondary | ICD-10-CM | POA: Insufficient documentation

## 2023-09-29 DIAGNOSIS — I2510 Atherosclerotic heart disease of native coronary artery without angina pectoris: Secondary | ICD-10-CM | POA: Insufficient documentation

## 2023-09-29 DIAGNOSIS — I10 Essential (primary) hypertension: Secondary | ICD-10-CM

## 2023-09-29 DIAGNOSIS — R519 Headache, unspecified: Secondary | ICD-10-CM | POA: Diagnosis present

## 2023-09-29 DIAGNOSIS — Z955 Presence of coronary angioplasty implant and graft: Secondary | ICD-10-CM | POA: Insufficient documentation

## 2023-09-29 DIAGNOSIS — Z87891 Personal history of nicotine dependence: Secondary | ICD-10-CM | POA: Diagnosis not present

## 2023-09-29 DIAGNOSIS — E1122 Type 2 diabetes mellitus with diabetic chronic kidney disease: Secondary | ICD-10-CM | POA: Diagnosis not present

## 2023-09-29 DIAGNOSIS — Z7984 Long term (current) use of oral hypoglycemic drugs: Secondary | ICD-10-CM | POA: Diagnosis not present

## 2023-09-29 DIAGNOSIS — Z85828 Personal history of other malignant neoplasm of skin: Secondary | ICD-10-CM | POA: Insufficient documentation

## 2023-09-29 LAB — CBC WITH DIFFERENTIAL/PLATELET
Abs Immature Granulocytes: 0.03 10*3/uL (ref 0.00–0.07)
Basophils Absolute: 0.1 10*3/uL (ref 0.0–0.1)
Basophils Relative: 1 %
Eosinophils Absolute: 0.3 10*3/uL (ref 0.0–0.5)
Eosinophils Relative: 4 %
HCT: 36.8 % — ABNORMAL LOW (ref 39.0–52.0)
Hemoglobin: 12.3 g/dL — ABNORMAL LOW (ref 13.0–17.0)
Immature Granulocytes: 0 %
Lymphocytes Relative: 21 %
Lymphs Abs: 1.6 10*3/uL (ref 0.7–4.0)
MCH: 29.8 pg (ref 26.0–34.0)
MCHC: 33.4 g/dL (ref 30.0–36.0)
MCV: 89.1 fL (ref 80.0–100.0)
Monocytes Absolute: 0.7 10*3/uL (ref 0.1–1.0)
Monocytes Relative: 9 %
Neutro Abs: 4.9 10*3/uL (ref 1.7–7.7)
Neutrophils Relative %: 65 %
Platelets: 249 10*3/uL (ref 150–400)
RBC: 4.13 MIL/uL — ABNORMAL LOW (ref 4.22–5.81)
RDW: 13.8 % (ref 11.5–15.5)
WBC: 7.7 10*3/uL (ref 4.0–10.5)
nRBC: 0 % (ref 0.0–0.2)

## 2023-09-29 LAB — BASIC METABOLIC PANEL WITH GFR
Anion gap: 13 (ref 5–15)
BUN: 19 mg/dL (ref 8–23)
CO2: 25 mmol/L (ref 22–32)
Calcium: 9.4 mg/dL (ref 8.9–10.3)
Chloride: 105 mmol/L (ref 98–111)
Creatinine, Ser: 1.57 mg/dL — ABNORMAL HIGH (ref 0.61–1.24)
GFR, Estimated: 44 mL/min — ABNORMAL LOW (ref >60)
Glucose, Bld: 143 mg/dL — ABNORMAL HIGH (ref 70–99)
Potassium: 4 mmol/L (ref 3.5–5.1)
Sodium: 143 mmol/L (ref 135–145)

## 2023-09-29 MED ORDER — AMLODIPINE BESYLATE 5 MG PO TABS: 5.0000 mg | ORAL_TABLET | Freq: Every day | ORAL | 0 refills | Status: AC

## 2023-09-29 NOTE — ED Triage Notes (Signed)
 Pt c/o HA and elevated BP x 2 wks; also reports increased level to chronic lower back pain

## 2023-09-29 NOTE — ED Provider Notes (Signed)
 [place] [facility]PARTMENT AT [facility] Provider Note  CSN: [id number] Arrival date & time: 09/29/23 1605  Chief Complaint(s) Hypertension  HPI [name] is a 78 y.o. male history of coronary artery disease, diabetes, CKD, hypertension presenting with headache.  Patient reports intermittent headaches over the past few weeks.  He has also noted that he has had some high blood pressure readings at home.  He was recently taken off amlodipine .  He denies any nausea or vomiting, numbness or tingling, visual disturbances, neck pain, fevers or chills, abdominal pain or chest pain, weakness, or any other new symptoms.  Also reports some chronic low back pain, no numbness or tingling, weakness in lower extremities.  No bowel or bladder incontinence.   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 PAC's    GERD (gastroesophageal reflux disease)    Heart murmur    History of kidney stones 07-31-13   x 1   Hyperlipidemia    Hypertension    Peripheral vascular disease (HCC)    Plantar fasciitis of left foot 07-31-13   left leg   PONV (postoperative nausea and vomiting)    Rheumatic fever    when 78 years old   Patient Active Problem List   Diagnosis Date Noted   Sepsis secondary to UTI (HCC) 11/02/2022   CAD (coronary artery disease) 11/02/2022   Type 2 diabetes mellitus (HCC) 11/02/2022   AKI (acute kidney injury) (HCC) 11/02/2022   Hyperlipidemia 11/02/2022   BPH (benign  prostatic hyperplasia) 11/02/2022   Dementia without behavioral disturbance (HCC) 11/02/2022   Status post carotid endarterectomy 12/02/2020   Rectal bleeding    GI bleeding 08/06/2020   Chronic diarrhea    Antiplatelet or antithrombotic long-term use    Spinal stenosis of lumbar region 07/22/2015   Spinal stenosis of lumbar region with radiculopathy 08/05/2013   Home Medication(s) Prior to Admission medications   Medication Sig Start Date End Date Taking? Authorizing Provider  acetaminophen  (TYLENOL ) 500 MG tablet Take 2 tablets (1,000 mg total) by mouth every 6 (six) hours as needed. Patient not taking: Reported on 11/02/2022 07/30/14   Wynetta Heckle, MD  Alogliptin Benzoate 25 MG TABS Take 12.5 mg by mouth every evening.    [provider]  amLODipine  (NORVASC ) 5 MG tablet Take 1 tablet (5 mg total) by mouth daily. 09/29/23   Mordecai Applebaum, MD  aspirin  EC 81 MG tablet Take 81 mg by mouth daily. Swallow whole.    [provider]  carvedilol  (COREG ) 6.25 MG tablet Take 6.25 mg by mouth 2 (two) times daily with a meal.    [provider]  cetirizine (ZYRTEC) 10 MG tablet Take 10 mg by mouth daily.    [provider]  cholecalciferol  (VITAMIN D3) 25 MCG (1000 UNIT) tablet Take 2,000 Units by mouth every 14 (fourteen) days.    [provider]  clopidogrel  (PLAVIX ) 75  MG tablet Take 1 tablet (75 mg total) by mouth daily. 08/11/20   Aura Leeds Latif, DO  docusate sodium  (COLACE) 100 MG capsule Take 1 capsule (100 mg total) by mouth every 12 (twelve) hours. 03/16/23   Eldon Greenland, MD  donepezil  (ARICEPT ) 10 MG tablet Take 10 mg by mouth in the morning.    [provider]  DULoxetine  (CYMBALTA ) 20 MG capsule Take 20 mg by mouth every evening.    [provider]  famotidine  (PEPCID ) 20 MG tablet Take 20 mg by mouth at bedtime.    [provider]  glipiZIDE  (GLUCOTROL ) 5 MG tablet Take 2.5 mg by mouth 2 (two) times  daily before a meal.    [provider]  LACTOBACILLUS PROBIOTIC PO Take 2 tablets by mouth in the morning and at bedtime.    [provider]  losartan  (COZAAR ) 100 MG tablet Take 100 mg by mouth in the morning.    [provider]  memantine  (NAMENDA ) 5 MG tablet Take 5 mg by mouth 2 (two) times daily.    [provider]  metFORMIN (GLUCOPHAGE) 1000 MG tablet Take 1,000 mg by mouth daily.    [provider]  Multiple Vitamin (MULTIVITAMIN WITH MINERALS) TABS tablet Take 1 tablet by mouth daily.    [provider]  nitroGLYCERIN  (NITROSTAT ) 0.4 MG SL tablet Place 0.4 mg under the tongue every 5 (five) minutes as needed for chest pain.    [provider]  Omega-3 Fatty Acids (FISH OIL ) 1000 MG CAPS Take 1 capsule by mouth daily.    [provider]  pravastatin  (PRAVACHOL ) 40 MG tablet Take 40 mg by mouth in the morning.    [provider]  tamsulosin  (FLOMAX ) 0.4 MG CAPS capsule Take 0.4 mg by mouth every evening.    [provider]  vitamin B-12 (CYANOCOBALAMIN ) 500 MCG tablet Take 500 mcg by mouth daily.    [provider]                                                                                                                                    Past Surgical History Past Surgical History:  Procedure Laterality Date   BIOPSY  08/10/2020   Procedure: BIOPSY;  Surgeon: Ace Holder, MD;  Location: [facility];  Service: Gastroenterology;;   CARDIAC CATHETERIZATION     x1 stents 12'14.   CIRCUMCISION     COLONOSCOPY WITH PROPOFOL  N/A 08/10/2020   Procedure: COLONOSCOPY WITH PROPOFOL ;  Surgeon: Ace Holder, MD;  Location: [facility];  Service: Gastroenterology;  Laterality: N/A;   ESOPHAGOGASTRODUODENOSCOPY (EGD) WITH PROPOFOL  N/A 08/10/2020   Procedure: ESOPHAGOGASTRODUODENOSCOPY (EGD) WITH PROPOFOL ;  Surgeon: Ace Holder, MD;  Location: [facility];  Service:  Gastroenterology;  Laterality: N/A;   FEMORAL-PERONEAL BYPASS GRAFT Right    HEMOSTASIS CLIP PLACEMENT  08/10/2020   Procedure: HEMOSTASIS CLIP PLACEMENT;  Surgeon: Ace Holder, MD;  Location: [facility];  Service: Gastroenterology;;   HOT HEMOSTASIS N/A 08/10/2020   Procedure: HOT HEMOSTASIS (ARGON PLASMA COAGULATION/BICAP);  Surgeon: Ace Holder, MD;  Location: [facility];  Service: Gastroenterology;  Laterality: N/A;   leg stent Right 07-31-13   right leg   LUMBAR LAMINECTOMY/DECOMPRESSION MICRODISCECTOMY N/A 08/05/2013   Procedure: MICRO LUMBAR DECOMPRESSION L4 - L5;  Surgeon: Loel Ring, MD;  Location: [facility];  Service: Orthopedics;  Laterality: N/A;   LUMBAR LAMINECTOMY/DECOMPRESSION MICRODISCECTOMY N/A 07/22/2015   Procedure: REDO DECOMPRESSION LEVEL 2 L3-4 L4-5;  Surgeon: Orvan Blanch, MD;  Location: [facility];  Service: Orthopedics;  Laterality: N/A;   RENAL ARTERY STENT Right 07-31-13   SHOULDER ARTHROSCOPY Left    RCTR   stent in Right kidney     VASECTOMY     Family History Family History  Problem Relation Age of Onset   Kidney disease Mother    Pancreatic cancer Mother    Lung cancer Father    Colon cancer Neg Hx    Esophageal cancer Neg Hx    Liver disease Neg Hx    Stomach cancer Neg Hx     Social History Social History   Tobacco Use   Smoking status: Former    Current packs/day: 0.00    Types: Cigarettes    Quit date: 08/01/2011    Years since quitting: 12.1   Smokeless tobacco: Never  Vaping Use   Vaping status: Never Used  Substance Use Topics   Alcohol  use: No   Drug use: No   Allergies Contrast media [iodinated contrast media] and Quinine derivatives  Review of Systems Review of Systems  All other systems reviewed and are negative.   Physical Exam Vital Signs  I have reviewed the triage vital signs BP (!) 187/82   Pulse 62   Temp 98.5 F (36.9 C) (Oral)   Resp 18   Ht 5' 9.5" (1.765 m)   Wt 91.6 kg   SpO2 98%   BMI  29.40 kg/m  Physical Exam Vitals and nursing note reviewed.  Constitutional:      General: He is not in acute distress.    Appearance: Normal appearance.  HENT:     Mouth/Throat:     Mouth: Mucous membranes are moist.  Eyes:     Conjunctiva/sclera: Conjunctivae normal.  Cardiovascular:     Rate and Rhythm: Normal rate and regular rhythm.  Pulmonary:     Effort: Pulmonary effort is normal. No respiratory distress.     Breath sounds: Normal breath sounds.  Abdominal:     General: Abdomen is flat.     Palpations: Abdomen is soft.     Tenderness: There is no abdominal tenderness.  Musculoskeletal:     Right lower leg: No edema.     Left lower leg: No edema.     Comments: C, T, L-spine tenderness absent  Skin:    General: Skin is warm and dry.     Capillary Refill: Capillary refill takes less than 2 seconds.  Neurological:     Mental Status: He is alert and oriented to person, place, and time. Mental status is at baseline.     Comments: Cranial nerves II through XII intact, strength 5 out of 5 in the bilateral upper and lower extremities, no sensory deficit to light touch, no dysmetria on finger-nose-finger testing, ambulatory with steady gait.   Psychiatric:        Mood and Affect: Mood normal.  Behavior: Behavior normal.     ED Results and Treatments Labs (all labs ordered are listed, but only abnormal results are displayed) Labs Reviewed  BASIC METABOLIC PANEL WITH GFR - Abnormal; Notable for the following components:      Result Value   Glucose, Bld 143 (*)    Creatinine, Ser 1.57 (*)    GFR, Estimated 44 (*)    All other components within normal limits  CBC WITH DIFFERENTIAL/PLATELET - Abnormal; Notable for the following components:   RBC 4.13 (*)    Hemoglobin 12.3 (*)    HCT 36.8 (*)    All other components within normal limits                                                                                                                           Radiology CT Head Wo Contrast Result Date: 09/29/2023 CLINICAL DATA:  Headache, increasing frequency or severity EXAM: CT HEAD WITHOUT CONTRAST TECHNIQUE: Contiguous axial images were obtained from the base of the skull through the vertex without intravenous contrast. RADIATION DOSE REDUCTION: This exam was performed according to the departmental dose-optimization program which includes automated exposure control, adjustment of the mA and/or kV according to patient size and/or use of iterative reconstruction technique. COMPARISON:  Head CT 11/04/2022 FINDINGS: Brain: Stable right hemispheric atrophy and expansion of the extra-axial spaces. No intracranial hemorrhage, mass effect, or midline shift. No hydrocephalus. The basilar cisterns are patent. No evidence of territorial infarct or acute ischemia. No extra-axial or intracranial fluid collection. Vascular: Atherosclerosis of skullbase vasculature without hyperdense vessel or abnormal calcification. Skull: No fracture or focal lesion. Sinuses/Orbits: No acute finding. Other: None. IMPRESSION: No acute intracranial abnormality. Electronically Signed   By: Chadwick Colonel M.D.   On: 09/29/2023 18:32    Pertinent labs & imaging results that were available during my care of the patient were reviewed by me and considered in my medical decision making (see MDM for details).  Medications Ordered in ED Medications - No data to display                                                                                                                                   Procedures Procedures  (including critical care time)  Medical Decision Making / ED Course   MDM:  78 year old presenting to the emergency department with headache, high blood pressure reading at home.  Patient overall well-appearing, does have high blood pressure in the emergency department.  Vitals otherwise reassuring.  Neurologic exam is normal.  Unclear cause of headache, could be  tension headache related or related to high blood pressure.  CT head is negative for bleeding, tumor.  Considered other causes such as subarachnoid hemorrhage, venous sinus thrombosis, temporal arteritis, carbon monoxide poisoning, but overall concern for any other process is very low.  He actually even denies headache at the time of my evaluation in the emergency department.  Given high blood pressure labs were also checked which were reassuring, shows CKD at baseline.  Discussed findings with the patient.  Will restart amlodipine  given his persistent high blood pressure.  He also reports that Tylenol  helps his headache and makes it go away completely but he has not been taking it regularly.   Patient also mentioned his chronic low back pain.  He has no midline tenderness and his neurologic exam is reassuring.  He denies any red flag symptoms to suggest any spinal cord process or fracture.  Do not think he needs further workup for this at this time.         Additional history obtained: -Additional history obtained from spouse -External records from outside source obtained and reviewed including: Chart review including previous notes, labs, imaging, consultation notes including prior notes    Lab Tests: -I ordered, reviewed, and interpreted labs.   The pertinent results include:   Labs Reviewed  BASIC METABOLIC PANEL WITH GFR - Abnormal; Notable for the following components:      Result Value   Glucose, Bld 143 (*)    Creatinine, Ser 1.57 (*)    GFR, Estimated 44 (*)    All other components within normal limits  CBC WITH DIFFERENTIAL/PLATELET - Abnormal; Notable for the following components:   RBC 4.13 (*)    Hemoglobin 12.3 (*)    HCT 36.8 (*)    All other components within normal limits    Notable for ckd    Imaging Studies ordered: I ordered imaging studies including CT head On my interpretation imaging demonstrates no acute process I independently visualized and interpreted  imaging. I agree with the radiologist interpretation   Medicines ordered and prescription drug management: Meds ordered this encounter  Medications   amLODipine  (NORVASC ) 5 MG tablet    Sig: Take 1 tablet (5 mg total) by mouth daily.    Dispense:  30 tablet    Refill:  0    -I have reviewed the patients home medicines and have made adjustments as needed  Social Determinants of Health:  Diagnosis or treatment significantly limited by social determinants of health: obesity   Reevaluation: After the interventions noted above, I reevaluated the patient and found that their symptoms have improved  Co morbidities that complicate the patient evaluation  Past Medical History:  Diagnosis Date   Anginal pain (HCC) 07-31-13   coronary stent x1 last 12'14   Arthritis    osteoarthritis   Bursitis 07-31-13   heel   Cancer (HCC) 07-31-13   skin cancer left cheek   Cataract 2014   bilateral cataract extraction   Chronic kidney disease 07-31-13    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 PAC's    GERD (gastroesophageal reflux disease)  Heart murmur    History of kidney stones 07-31-13   x 1   Hyperlipidemia    Hypertension    Peripheral vascular disease (HCC)    Plantar fasciitis of left foot 07-31-13   left leg   PONV (postoperative nausea and vomiting)    Rheumatic fever    when 78 years old      Dispostion: Disposition decision including need for hospitalization was considered, and patient discharged from emergency department.    Final Clinical Impression(s) / ED Diagnoses Final diagnoses:  Nonintractable headache, unspecified chronicity pattern, unspecified headache type  Hypertension, unspecified type     This chart was dictated using voice recognition software.  Despite best efforts  to proofread,  errors can occur which can change the documentation meaning.    Mordecai Applebaum, MD 09/29/23 2244

## 2023-09-29 NOTE — ED Notes (Signed)
 Isaiah Marc, MD aware of current BP and okay with patient being discharged as is.

## 2023-09-29 NOTE — Discharge Instructions (Addendum)
 We evaluated you for your headache and high blood pressure.  Your blood tests and CT scan were reassuring.  We did not see any dangerous cause of your headache.  Since your blood pressure has been more elevated since stopping amlodipine , we will restart you on 5 mg of amlodipine  daily.  [facility] and see if your [facility] is improving.  You can also take Tylenol  1000 mg every 6 hours as needed for headache.  Please return if you develop any new or worsening symptoms such as severe worsening headache, vomiting, fevers or chills, neck stiffness, numbness or tingling, weakness, changes in vision, or any other concerning symptoms.
